# Patient Record
Sex: Female | Born: 1965 | Race: Black or African American | Hispanic: No | Smoking: Never smoker
Health system: Southern US, Community
[De-identification: ages and names within clinical notes are randomized; demographics above are authoritative.]

## PROBLEM LIST (undated history)

## (undated) DIAGNOSIS — I10 Essential (primary) hypertension: Secondary | ICD-10-CM

## (undated) DIAGNOSIS — Z973 Presence of spectacles and contact lenses: Secondary | ICD-10-CM

## (undated) DIAGNOSIS — J302 Other seasonal allergic rhinitis: Secondary | ICD-10-CM

## (undated) DIAGNOSIS — J189 Pneumonia, unspecified organism: Secondary | ICD-10-CM

## (undated) DIAGNOSIS — J40 Bronchitis, not specified as acute or chronic: Secondary | ICD-10-CM

## (undated) DIAGNOSIS — M1990 Unspecified osteoarthritis, unspecified site: Secondary | ICD-10-CM

## (undated) HISTORY — PX: LAPAROSCOPY FOR ECTOPIC PREGNANCY: SUR765

## (undated) HISTORY — PX: DIAGNOSTIC LAPAROSCOPY: SUR761

## (undated) HISTORY — PX: MYELOGRAM: SHX5347

## (undated) HISTORY — PX: TONSILLECTOMY: SUR1361

## (undated) HISTORY — PX: WISDOM TOOTH EXTRACTION: SHX21

---

## 1997-11-07 ENCOUNTER — Emergency Department (HOSPITAL_COMMUNITY): Admission: EM | Admit: 1997-11-07 | Discharge: 1997-11-07 | Payer: Self-pay | Admitting: Emergency Medicine

## 1998-07-19 ENCOUNTER — Emergency Department (HOSPITAL_COMMUNITY): Admission: EM | Admit: 1998-07-19 | Discharge: 1998-07-19 | Payer: Self-pay | Admitting: Emergency Medicine

## 1999-01-04 ENCOUNTER — Encounter: Payer: Self-pay | Admitting: Emergency Medicine

## 1999-01-04 ENCOUNTER — Emergency Department (HOSPITAL_COMMUNITY): Admission: EM | Admit: 1999-01-04 | Discharge: 1999-01-04 | Payer: Self-pay | Admitting: Emergency Medicine

## 1999-01-13 ENCOUNTER — Ambulatory Visit (HOSPITAL_COMMUNITY): Admission: RE | Admit: 1999-01-13 | Discharge: 1999-01-13 | Payer: Self-pay | Admitting: Pulmonary Disease

## 1999-02-10 ENCOUNTER — Encounter (INDEPENDENT_AMBULATORY_CARE_PROVIDER_SITE_OTHER): Payer: Self-pay | Admitting: Specialist

## 1999-02-10 ENCOUNTER — Encounter: Payer: Self-pay | Admitting: Pulmonary Disease

## 1999-02-10 ENCOUNTER — Ambulatory Visit: Admission: RE | Admit: 1999-02-10 | Discharge: 1999-02-10 | Payer: Self-pay | Admitting: Pulmonary Disease

## 1999-07-11 ENCOUNTER — Emergency Department (HOSPITAL_COMMUNITY): Admission: EM | Admit: 1999-07-11 | Discharge: 1999-07-11 | Payer: Self-pay | Admitting: *Deleted

## 2000-08-21 ENCOUNTER — Emergency Department (HOSPITAL_COMMUNITY): Admission: EM | Admit: 2000-08-21 | Discharge: 2000-08-21 | Payer: Self-pay | Admitting: Emergency Medicine

## 2001-06-18 ENCOUNTER — Emergency Department (HOSPITAL_COMMUNITY): Admission: EM | Admit: 2001-06-18 | Discharge: 2001-06-18 | Payer: Self-pay

## 2002-07-10 ENCOUNTER — Ambulatory Visit (HOSPITAL_COMMUNITY): Admission: RE | Admit: 2002-07-10 | Discharge: 2002-07-10 | Payer: Self-pay | Admitting: Obstetrics and Gynecology

## 2002-07-10 ENCOUNTER — Encounter: Payer: Self-pay | Admitting: Obstetrics and Gynecology

## 2003-10-22 ENCOUNTER — Encounter: Admission: RE | Admit: 2003-10-22 | Discharge: 2003-10-22 | Payer: Self-pay | Admitting: Obstetrics and Gynecology

## 2004-01-19 ENCOUNTER — Emergency Department (HOSPITAL_COMMUNITY): Admission: EM | Admit: 2004-01-19 | Discharge: 2004-01-19 | Payer: Self-pay | Admitting: Emergency Medicine

## 2004-10-25 ENCOUNTER — Emergency Department (HOSPITAL_COMMUNITY): Admission: EM | Admit: 2004-10-25 | Discharge: 2004-10-25 | Payer: Self-pay | Admitting: Emergency Medicine

## 2004-10-27 ENCOUNTER — Encounter: Admission: RE | Admit: 2004-10-27 | Discharge: 2004-10-27 | Payer: Self-pay | Admitting: Obstetrics and Gynecology

## 2007-03-10 ENCOUNTER — Emergency Department (HOSPITAL_COMMUNITY): Admission: EM | Admit: 2007-03-10 | Discharge: 2007-03-10 | Payer: Self-pay | Admitting: Emergency Medicine

## 2007-03-14 ENCOUNTER — Emergency Department (HOSPITAL_COMMUNITY): Admission: EM | Admit: 2007-03-14 | Discharge: 2007-03-14 | Payer: Self-pay | Admitting: *Deleted

## 2007-08-21 ENCOUNTER — Emergency Department (HOSPITAL_COMMUNITY): Admission: EM | Admit: 2007-08-21 | Discharge: 2007-08-21 | Payer: Self-pay | Admitting: Emergency Medicine

## 2007-08-27 ENCOUNTER — Emergency Department (HOSPITAL_COMMUNITY): Admission: EM | Admit: 2007-08-27 | Discharge: 2007-08-27 | Payer: Self-pay | Admitting: Emergency Medicine

## 2007-09-25 ENCOUNTER — Ambulatory Visit: Payer: Self-pay | Admitting: Family Medicine

## 2008-03-18 ENCOUNTER — Emergency Department (HOSPITAL_COMMUNITY): Admission: EM | Admit: 2008-03-18 | Discharge: 2008-03-18 | Payer: Self-pay | Admitting: Emergency Medicine

## 2008-04-23 ENCOUNTER — Encounter: Admission: RE | Admit: 2008-04-23 | Discharge: 2008-04-23 | Payer: Self-pay | Admitting: Obstetrics and Gynecology

## 2010-04-16 ENCOUNTER — Other Ambulatory Visit: Payer: Self-pay | Admitting: Obstetrics and Gynecology

## 2010-06-14 LAB — RAPID STREP SCREEN (MED CTR MEBANE ONLY): Streptococcus, Group A Screen (Direct): NEGATIVE

## 2010-09-16 ENCOUNTER — Emergency Department (HOSPITAL_COMMUNITY): Payer: Self-pay

## 2010-09-16 ENCOUNTER — Emergency Department (HOSPITAL_COMMUNITY)
Admission: EM | Admit: 2010-09-16 | Discharge: 2010-09-16 | Disposition: A | Discharge status: Home / Self Care | Payer: Self-pay | Attending: Emergency Medicine | Admitting: Emergency Medicine

## 2010-09-16 DIAGNOSIS — T185XXA Foreign body in anus and rectum, initial encounter: Secondary | ICD-10-CM | POA: Insufficient documentation

## 2010-09-16 DIAGNOSIS — IMO0002 Reserved for concepts with insufficient information to code with codable children: Secondary | ICD-10-CM | POA: Insufficient documentation

## 2010-09-16 DIAGNOSIS — I10 Essential (primary) hypertension: Secondary | ICD-10-CM | POA: Insufficient documentation

## 2010-09-16 LAB — BASIC METABOLIC PANEL
BUN: 13 mg/dL (ref 6–23)
CO2: 27 mEq/L (ref 19–32)
Calcium: 10.1 mg/dL (ref 8.4–10.5)
Glucose, Bld: 111 mg/dL — ABNORMAL HIGH (ref 70–99)
Sodium: 136 mEq/L (ref 135–145)

## 2010-09-16 LAB — DIFFERENTIAL
Basophils Relative: 0 % (ref 0–1)
Eosinophils Relative: 1 % (ref 0–5)
Monocytes Absolute: 0.8 10*3/uL (ref 0.1–1.0)

## 2010-09-16 LAB — CBC
HCT: 39.6 % (ref 36.0–46.0)
Hemoglobin: 14 g/dL (ref 12.0–15.0)
MCH: 28.9 pg (ref 26.0–34.0)
RBC: 4.84 MIL/uL (ref 3.87–5.11)

## 2011-02-24 ENCOUNTER — Encounter: Payer: Self-pay | Admitting: Emergency Medicine

## 2011-02-24 ENCOUNTER — Emergency Department (HOSPITAL_COMMUNITY)
Admission: EM | Admit: 2011-02-24 | Discharge: 2011-02-24 | Disposition: A | Discharge status: Home / Self Care | Payer: Self-pay | Attending: Emergency Medicine | Admitting: Emergency Medicine

## 2011-02-24 DIAGNOSIS — R6889 Other general symptoms and signs: Secondary | ICD-10-CM | POA: Insufficient documentation

## 2011-02-24 DIAGNOSIS — R059 Cough, unspecified: Secondary | ICD-10-CM | POA: Insufficient documentation

## 2011-02-24 DIAGNOSIS — IMO0001 Reserved for inherently not codable concepts without codable children: Secondary | ICD-10-CM | POA: Insufficient documentation

## 2011-02-24 DIAGNOSIS — J45909 Unspecified asthma, uncomplicated: Secondary | ICD-10-CM | POA: Insufficient documentation

## 2011-02-24 DIAGNOSIS — R5381 Other malaise: Secondary | ICD-10-CM | POA: Insufficient documentation

## 2011-02-24 DIAGNOSIS — R51 Headache: Secondary | ICD-10-CM | POA: Insufficient documentation

## 2011-02-24 DIAGNOSIS — R0789 Other chest pain: Secondary | ICD-10-CM | POA: Insufficient documentation

## 2011-02-24 DIAGNOSIS — R05 Cough: Secondary | ICD-10-CM | POA: Insufficient documentation

## 2011-02-24 HISTORY — DX: Bronchitis, not specified as acute or chronic: J40

## 2011-02-24 HISTORY — DX: Essential (primary) hypertension: I10

## 2011-02-24 MED ORDER — ALBUTEROL SULFATE HFA 108 (90 BASE) MCG/ACT IN AERS
1.0000 | INHALATION_SPRAY | Freq: Four times a day (QID) | RESPIRATORY_TRACT | Status: DC | PRN
Start: 2011-02-24 — End: 2013-02-26

## 2011-02-24 MED ORDER — BENZONATATE 100 MG PO CAPS
100.0000 mg | ORAL_CAPSULE | Freq: Once | ORAL | Status: AC
  Administered 2011-02-24: 100 mg via ORAL
  Filled 2011-02-24: qty 1

## 2011-02-24 MED ORDER — ALBUTEROL SULFATE (5 MG/ML) 0.5% IN NEBU
5.0000 mg | INHALATION_SOLUTION | Freq: Once | RESPIRATORY_TRACT | Status: AC
  Administered 2011-02-24: 5 mg via RESPIRATORY_TRACT
  Filled 2011-02-24: qty 1

## 2011-02-24 MED ORDER — IPRATROPIUM BROMIDE 0.02 % IN SOLN
0.5000 mg | Freq: Once | RESPIRATORY_TRACT | Status: AC
  Administered 2011-02-24: 0.5 mg via RESPIRATORY_TRACT
  Filled 2011-02-24: qty 2.5

## 2011-02-24 MED ORDER — HYDROCODONE-HOMATROPINE 5-1.5 MG/5ML PO SYRP: 5.0000 mL | ORAL_SOLUTION | Freq: Four times a day (QID) | ORAL | Status: AC | PRN

## 2011-02-24 NOTE — ED Provider Notes (Signed)
Medical screening examination/treatment/procedure(s) were performed by non-physician practitioner and as supervising physician I was immediately available for consultation/collaboration.   Eleno Weimar, MD 02/24/11 1257 

## 2011-02-24 NOTE — ED Notes (Signed)
Pt c/o asthma "flare-up" x 2 weeks with cough, nasal congestion, body aches; states she was out working in the rain over the past several days and has just gotten worse since that time; using inhalers with no relief; no acute distress noted at triage with O2 sat at 100%

## 2011-02-24 NOTE — ED Provider Notes (Signed)
History     CSN: [id number]  Arrival date & time 02/24/11  9147   First MD Initiated Contact with Patient 02/24/11 1028      Chief Complaint  Patient presents with  . Asthma  . Cough  . Generalized Body Aches  . Headache  . Nasal Congestion    (Consider location/radiation/quality/duration/timing/severity/associated sxs/prior treatment) HPI Comments: Pt presents to the ED with complaints of flu-like symptoms of cough, congestion, sore throat, muscle aches, chills, fevers, ear pain, headaches, abdominal pain, vomiting, diarrhea. The patient states that the symptoms started 7days ago.  Pt has been around other sick contacts and did not get the flu shot this year. The patient denies neck pain, weakness, vision changes, severe abdominal pain, inability to eat or drink, difficulty breathing, SOB, chest pain. The patient has tried cough medicine, NSAIDS, and rest but has only felt mild relief.   Pt has asthma and states that she thinks this sickness is "causing a flare up"    Patient is a 45 y.o. female presenting with asthma, cough, and headaches. The history is provided by the patient.  Asthma This is a chronic problem. Associated symptoms include chills, coughing, fatigue, headaches and myalgias. Pertinent negatives include no abdominal pain, chest pain, congestion, fever, nausea, neck pain, rash, sore throat, vomiting or weakness.  Cough Associated symptoms include chills, headaches and myalgias. Pertinent negatives include no chest pain, no ear pain, no rhinorrhea and no sore throat. Her past medical history is significant for asthma.  Headache  Pertinent negatives include no fever, no palpitations, no nausea and no vomiting.    Past Medical History  Diagnosis Date  . Hypertension   . Asthma   . Bronchitis     Past Surgical History  Procedure Date  . Abdominal hysterectomy     No family history on file.  History  Substance Use Topics  . Smoking status: Never Smoker     . Smokeless tobacco: Not on file  . Alcohol Use: Yes     occasionally    OB History    Grav Para Term Preterm Abortions TAB SAB Ect Mult Living                  Review of Systems  Constitutional: Positive for chills and fatigue. Negative for fever.  HENT: Negative for ear pain, congestion, sore throat, rhinorrhea, sneezing, neck pain, neck stiffness, sinus pressure and tinnitus.   Eyes: Negative for visual disturbance.  Respiratory: Positive for cough and chest tightness.   Cardiovascular: Negative for chest pain and palpitations.  Gastrointestinal: Negative for nausea, vomiting, abdominal pain and diarrhea.  Genitourinary: Negative for dysuria.  Musculoskeletal: Positive for myalgias.  Skin: Negative for color change and rash.  Neurological: Positive for headaches. Negative for dizziness and weakness.  Hematological: Does not bruise/bleed easily.  Psychiatric/Behavioral: Negative for confusion.  All other systems reviewed and are negative.    Allergies  Review of patient's allergies indicates no known allergies.  Home Medications   Current Outpatient Rx  Name Route Sig Dispense Refill  . DEXTROMETHORPHAN POLISTIREX ER 30 MG/5ML PO LQCR Oral Take 60 mg by mouth as needed. COUGH     . HYDROCHLOROTHIAZIDE 25 MG PO TABS Oral Take 25 mg by mouth daily.      Lenn Sink ALLERGY/COUGH PO Oral Take 10 mLs by mouth at bedtime as needed. COUGH     . SPIRONOLACTONE 25 MG PO TABS Oral Take 25 mg by mouth daily.  BP 171/113  Pulse 88  Temp(Src) 98.1 F (36.7 C) (Oral)  Resp 16  SpO2 100%  Physical Exam  Constitutional: She is oriented to person, place, and time. She appears well-developed and well-nourished. No distress.  HENT:  Head: Normocephalic and atraumatic. No trismus in the jaw.  Right Ear: External ear normal. No drainage or tenderness. No mastoid tenderness.  Left Ear: External ear normal. No drainage or tenderness. No mastoid tenderness.  Nose: Nose  normal. No rhinorrhea or sinus tenderness.  Mouth/Throat: Uvula is midline, oropharynx is clear and moist and mucous membranes are normal. No uvula swelling. No oropharyngeal exudate.  Eyes: Conjunctivae and EOM are normal. Right eye exhibits no discharge. Left eye exhibits no discharge. No scleral icterus.  Neck: Normal range of motion. Neck supple.  Cardiovascular: Normal rate, regular rhythm and normal heart sounds.   Pulmonary/Chest: Effort normal and breath sounds normal. No stridor. No respiratory distress. She has no wheezes. She exhibits tenderness.  Abdominal: Soft. There is no tenderness.  Musculoskeletal: Normal range of motion.  Neurological: She is alert and oriented to person, place, and time.  Skin: Skin is warm and dry. No rash noted. She is not diaphoretic.  Psychiatric: She has a normal mood and affect. Her behavior is normal.    ED Course  Procedures (including critical care time)  Labs Reviewed - No data to display No results found.   No diagnosis found.    MDM  Flu like symptoms, asthma  Patient presents with flulike symptoms.  Due to patient's presentation and physical exam a chest x-ray was not ordered bc likely diagnosis of flu.  Discussed the cost versus benefit of Tamiflu treatment with the patient.  The patient understands that symptoms are greater than the recommended 24-48 hour window of treatment.  Patient will be discharged with instructions to orally hydrate, rest, and use over-the-counter medications such as anti-inflammatories ibuprofen and Aleve for muscle aches and Tylenol for fever.  Patient will also be given a cough suppressant. Pt also given a neb tx and an albuterol inhaler.          [place], [place] 02/24/11 1047  [place], [place] 02/24/11 1051

## 2011-06-17 ENCOUNTER — Other Ambulatory Visit (HOSPITAL_COMMUNITY): Payer: Self-pay | Admitting: Obstetrics and Gynecology

## 2011-06-17 DIAGNOSIS — Z1231 Encounter for screening mammogram for malignant neoplasm of breast: Secondary | ICD-10-CM

## 2011-07-13 ENCOUNTER — Ambulatory Visit (HOSPITAL_COMMUNITY)
Admission: RE | Admit: 2011-07-13 | Discharge: 2011-07-13 | Disposition: A | Discharge status: Home / Self Care | Payer: Self-pay | Source: Ambulatory Visit | Attending: Obstetrics and Gynecology | Admitting: Obstetrics and Gynecology

## 2011-07-13 DIAGNOSIS — Z1231 Encounter for screening mammogram for malignant neoplasm of breast: Secondary | ICD-10-CM

## 2012-02-10 ENCOUNTER — Emergency Department (HOSPITAL_BASED_OUTPATIENT_CLINIC_OR_DEPARTMENT_OTHER)
Admission: EM | Admit: 2012-02-10 | Discharge: 2012-02-10 | Disposition: A | Discharge status: Home / Self Care | Payer: Self-pay | Attending: Emergency Medicine | Admitting: Emergency Medicine

## 2012-02-10 ENCOUNTER — Emergency Department (HOSPITAL_BASED_OUTPATIENT_CLINIC_OR_DEPARTMENT_OTHER): Payer: Self-pay

## 2012-02-10 ENCOUNTER — Encounter (HOSPITAL_BASED_OUTPATIENT_CLINIC_OR_DEPARTMENT_OTHER): Payer: Self-pay | Admitting: Family Medicine

## 2012-02-10 DIAGNOSIS — Z79899 Other long term (current) drug therapy: Secondary | ICD-10-CM | POA: Insufficient documentation

## 2012-02-10 DIAGNOSIS — Z8709 Personal history of other diseases of the respiratory system: Secondary | ICD-10-CM | POA: Insufficient documentation

## 2012-02-10 DIAGNOSIS — L989 Disorder of the skin and subcutaneous tissue, unspecified: Secondary | ICD-10-CM | POA: Insufficient documentation

## 2012-02-10 DIAGNOSIS — I10 Essential (primary) hypertension: Secondary | ICD-10-CM | POA: Insufficient documentation

## 2012-02-10 DIAGNOSIS — J45909 Unspecified asthma, uncomplicated: Secondary | ICD-10-CM | POA: Insufficient documentation

## 2012-02-10 DIAGNOSIS — M7989 Other specified soft tissue disorders: Secondary | ICD-10-CM | POA: Insufficient documentation

## 2012-02-10 MED ORDER — SULFAMETHOXAZOLE-TRIMETHOPRIM 800-160 MG PO TABS: 1.0000 | ORAL_TABLET | Freq: Two times a day (BID) | ORAL | Status: DC

## 2012-02-10 NOTE — ED Notes (Signed)
Pt c/o pain and swelling to pinky finger on right hand x 3 wks. Pt sts she thought it was a wart initially.

## 2012-02-10 NOTE — ED Provider Notes (Signed)
Medical screening examination/treatment/procedure(s) were performed by non-physician practitioner and as supervising physician I was immediately available for consultation/collaboration.   Gwyneth Sprout, MD 02/10/12 1539

## 2012-02-10 NOTE — ED Provider Notes (Signed)
History     CSN: [id number]  Arrival date & time 02/10/12  1300   First MD Initiated Contact with Patient 02/10/12 1338      Chief Complaint  Patient presents with  . Hand Pain    (Consider location/radiation/quality/duration/timing/severity/associated sxs/prior treatment) Patient is a 46 y.o. female presenting with hand pain. The history is provided by the patient. No language interpreter was used.  Hand Pain This is a new problem. Episode onset: 3 weeks. The problem occurs constantly. The problem has been gradually worsening. Associated symptoms include joint swelling. The symptoms are aggravated by bending. She has tried nothing for the symptoms. The treatment provided moderate relief.  Pt complains of a swollen area on left finger for 3 weeks.  Pt reports area is starting to look red.  Pt used wart remover with no relief  Past Medical History  Diagnosis Date  . Hypertension   . Asthma   . Bronchitis     Past Surgical History  Procedure Date  . Abdominal hysterectomy     No family history on file.  History  Substance Use Topics  . Smoking status: Never Smoker   . Smokeless tobacco: Not on file  . Alcohol Use: Yes     Comment: occasionally    OB History    Grav Para Term Preterm Abortions TAB SAB Ect Mult Living                  Review of Systems  Musculoskeletal: Positive for joint swelling.  All other systems reviewed and are negative.    Allergies  Review of patient's allergies indicates no known allergies.  Home Medications   Current Outpatient Rx  Name  Route  Sig  Dispense  Refill  . ALBUTEROL SULFATE HFA 108 (90 BASE) MCG/ACT IN AERS   Inhalation   Inhale 1-2 puffs into the lungs every 6 (six) hours as needed for wheezing.   1 Inhaler   0   . DEXTROMETHORPHAN POLISTIREX ER 30 MG/5ML PO LQCR   Oral   Take 60 mg by mouth as needed. COUGH          . HYDROCHLOROTHIAZIDE 25 MG PO TABS   Oral   Take 25 mg by mouth daily.           Lenn Sink ALLERGY/COUGH PO   Oral   Take 10 mLs by mouth at bedtime as needed. COUGH          . SPIRONOLACTONE 25 MG PO TABS   Oral   Take 25 mg by mouth daily.             BP 195/120  Pulse 76  Temp 98.2 F (36.8 C) (Oral)  Resp 16  Ht 5\' 5"  (1.651 m)  Wt 200 lb (90.719 kg)  BMI 33.28 kg/m2  SpO2 100%  LMP 01/13/2012  Physical Exam  Nursing note and vitals reviewed. Constitutional: She appears well-developed and well-nourished.  HENT:  Head: Normocephalic.  Musculoskeletal: Normal range of motion. She exhibits tenderness.       Left 5th finger palmar aspect at dip,   Warty looking growth,  Erythema around area  Neurological: She is alert.  Skin: Skin is warm.  Psychiatric: She has a normal mood and affect.    ED Course  Procedures (including critical care time)  Labs Reviewed - No data to display No results found.   No diagnosis found.    MDM  Xray no abnormality,   I advised pt  to schedule to see Dr. Mina Marble.   Pt given rx for bactrim ds  [address]       Lonia Skinner [place], [place] 02/10/12 1523

## 2012-02-14 ENCOUNTER — Encounter (HOSPITAL_BASED_OUTPATIENT_CLINIC_OR_DEPARTMENT_OTHER): Payer: Self-pay | Admitting: *Deleted

## 2012-02-14 ENCOUNTER — Other Ambulatory Visit: Payer: Self-pay | Admitting: Orthopedic Surgery

## 2012-02-14 NOTE — Progress Notes (Signed)
Pt has no pcp-goes to ER Will need istat and ekg

## 2012-02-15 ENCOUNTER — Encounter (HOSPITAL_BASED_OUTPATIENT_CLINIC_OR_DEPARTMENT_OTHER)
Admission: RE | Disposition: A | Discharge status: Home / Self Care | Payer: Self-pay | Source: Ambulatory Visit | Attending: Orthopedic Surgery

## 2012-02-15 ENCOUNTER — Encounter (HOSPITAL_BASED_OUTPATIENT_CLINIC_OR_DEPARTMENT_OTHER): Payer: Self-pay

## 2012-02-15 ENCOUNTER — Ambulatory Visit (HOSPITAL_BASED_OUTPATIENT_CLINIC_OR_DEPARTMENT_OTHER): Payer: Self-pay | Admitting: Anesthesiology

## 2012-02-15 ENCOUNTER — Ambulatory Visit (HOSPITAL_BASED_OUTPATIENT_CLINIC_OR_DEPARTMENT_OTHER)
Admission: RE | Admit: 2012-02-15 | Discharge: 2012-02-15 | Disposition: A | Discharge status: Home / Self Care | Payer: Self-pay | Source: Ambulatory Visit | Attending: Orthopedic Surgery | Admitting: Orthopedic Surgery

## 2012-02-15 ENCOUNTER — Encounter (HOSPITAL_BASED_OUTPATIENT_CLINIC_OR_DEPARTMENT_OTHER): Payer: Self-pay | Admitting: Anesthesiology

## 2012-02-15 DIAGNOSIS — I10 Essential (primary) hypertension: Secondary | ICD-10-CM | POA: Insufficient documentation

## 2012-02-15 DIAGNOSIS — D1809 Hemangioma of other sites: Secondary | ICD-10-CM | POA: Insufficient documentation

## 2012-02-15 DIAGNOSIS — J45909 Unspecified asthma, uncomplicated: Secondary | ICD-10-CM | POA: Insufficient documentation

## 2012-02-15 DIAGNOSIS — L980 Pyogenic granuloma: Secondary | ICD-10-CM | POA: Diagnosis present

## 2012-02-15 HISTORY — PX: MASS EXCISION: SHX2000

## 2012-02-15 LAB — POCT I-STAT, CHEM 8
BUN: 15 mg/dL (ref 6–23)
Calcium, Ion: 1.26 mmol/L — ABNORMAL HIGH (ref 1.12–1.23)
Chloride: 105 mEq/L (ref 96–112)
Creatinine, Ser: 1.1 mg/dL (ref 0.50–1.10)
Glucose, Bld: 92 mg/dL (ref 70–99)
HCT: 44 % (ref 36.0–46.0)
Hemoglobin: 15 g/dL (ref 12.0–15.0)
Potassium: 3.8 mEq/L (ref 3.5–5.1)
Sodium: 149 mEq/L — ABNORMAL HIGH (ref 135–145)
TCO2: 26 mmol/L (ref 0–100)

## 2012-02-15 SURGERY — EXCISION MASS
Anesthesia: General | Site: Finger | Laterality: Right | Wound class: Clean

## 2012-02-15 MED ORDER — ONDANSETRON HCL 4 MG/2ML IJ SOLN: 4.0000 mg | Freq: Once | INTRAMUSCULAR | Status: DC | PRN

## 2012-02-15 MED ORDER — CEFAZOLIN SODIUM-DEXTROSE 2-3 GM-% IV SOLR
INTRAVENOUS | Status: DC | PRN
  Administered 2012-02-15: 2 g via INTRAVENOUS

## 2012-02-15 MED ORDER — DEXAMETHASONE SODIUM PHOSPHATE 4 MG/ML IJ SOLN
INTRAMUSCULAR | Status: DC | PRN
  Administered 2012-02-15: 10 mg via INTRAVENOUS

## 2012-02-15 MED ORDER — OXYCODONE-ACETAMINOPHEN 5-325 MG PO TABS: 1.0000 | ORAL_TABLET | ORAL | Status: DC | PRN

## 2012-02-15 MED ORDER — LACTATED RINGERS IV SOLN
INTRAVENOUS | Status: DC
Start: 2012-02-15 — End: 2012-02-15
  Administered 2012-02-15 (×2): via INTRAVENOUS

## 2012-02-15 MED ORDER — OXYCODONE HCL 5 MG/5ML PO SOLN: 5.0000 mg | Freq: Once | ORAL | Status: DC | PRN

## 2012-02-15 MED ORDER — FENTANYL CITRATE 0.05 MG/ML IJ SOLN
INTRAMUSCULAR | Status: DC | PRN
  Administered 2012-02-15: 100 ug via INTRAVENOUS

## 2012-02-15 MED ORDER — LIDOCAINE HCL (CARDIAC) 20 MG/ML IV SOLN
INTRAVENOUS | Status: DC | PRN
  Administered 2012-02-15: 100 mg via INTRAVENOUS

## 2012-02-15 MED ORDER — CHLORHEXIDINE GLUCONATE 4 % EX LIQD: 60.0000 mL | Freq: Once | CUTANEOUS | Status: DC

## 2012-02-15 MED ORDER — ONDANSETRON HCL 4 MG/2ML IJ SOLN
INTRAMUSCULAR | Status: DC | PRN
  Administered 2012-02-15: 4 mg via INTRAVENOUS

## 2012-02-15 MED ORDER — BUPIVACAINE HCL (PF) 0.25 % IJ SOLN
INTRAMUSCULAR | Status: DC | PRN
  Administered 2012-02-15: 2 mL

## 2012-02-15 MED ORDER — OXYCODONE HCL 5 MG PO TABS: 5.0000 mg | ORAL_TABLET | Freq: Once | ORAL | Status: DC | PRN

## 2012-02-15 MED ORDER — PROPOFOL 10 MG/ML IV BOLUS
INTRAVENOUS | Status: DC | PRN
  Administered 2012-02-15: 200 mg via INTRAVENOUS

## 2012-02-15 MED ORDER — HYDROMORPHONE HCL PF 1 MG/ML IJ SOLN
0.2500 mg | INTRAMUSCULAR | Status: DC | PRN
  Administered 2012-02-15 (×3): 0.5 mg via INTRAVENOUS

## 2012-02-15 SURGICAL SUPPLY — 49 items
APL SKNCLS STERI-STRIP NONHPOA (GAUZE/BANDAGES/DRESSINGS)
BAG DECANTER FOR FLEXI CONT (MISCELLANEOUS) IMPLANT
BANDAGE ELASTIC 3 VELCRO ST LF (GAUZE/BANDAGES/DRESSINGS) ×2 IMPLANT
BANDAGE ELASTIC 4 VELCRO ST LF (GAUZE/BANDAGES/DRESSINGS) IMPLANT
BANDAGE GAUZE ELAST BULKY 4 IN (GAUZE/BANDAGES/DRESSINGS) ×2 IMPLANT
BENZOIN TINCTURE PRP APPL 2/3 (GAUZE/BANDAGES/DRESSINGS) IMPLANT
BLADE SURG 15 STRL LF DISP TIS (BLADE) ×1 IMPLANT
BLADE SURG 15 STRL SS (BLADE) ×2
BNDG CMPR 9X4 STRL LF SNTH (GAUZE/BANDAGES/DRESSINGS) ×1
BNDG COHESIVE 1X5 TAN STRL LF (GAUZE/BANDAGES/DRESSINGS) ×1 IMPLANT
BNDG ESMARK 4X9 LF (GAUZE/BANDAGES/DRESSINGS) ×1 IMPLANT
CLOTH BEACON ORANGE TIMEOUT ST (SAFETY) ×2 IMPLANT
CORDS BIPOLAR (ELECTRODE) ×2 IMPLANT
COVER TABLE BACK 60X90 (DRAPES) ×2 IMPLANT
CUFF TOURNIQUET SINGLE 18IN (TOURNIQUET CUFF) IMPLANT
CUFF TOURNIQUET SINGLE 24IN (TOURNIQUET CUFF) ×1 IMPLANT
DECANTER SPIKE VIAL GLASS SM (MISCELLANEOUS) IMPLANT
DRAPE EXTREMITY T 121X128X90 (DRAPE) ×2 IMPLANT
DRAPE SURG 17X23 STRL (DRAPES) ×2 IMPLANT
DURAPREP 26ML APPLICATOR (WOUND CARE) ×2 IMPLANT
GAUZE XEROFORM 1X8 LF (GAUZE/BANDAGES/DRESSINGS) ×1 IMPLANT
GLOVE BIO SURGEON STRL SZ 6.5 (GLOVE) ×2 IMPLANT
GLOVE BIO SURGEON STRL SZ8.5 (GLOVE) ×2 IMPLANT
GOWN PREVENTION PLUS XLARGE (GOWN DISPOSABLE) ×2 IMPLANT
GOWN PREVENTION PLUS XXLARGE (GOWN DISPOSABLE) ×2 IMPLANT
NDL HYPO 25X1 1.5 SAFETY (NEEDLE) IMPLANT
NEEDLE HYPO 25X1 1.5 SAFETY (NEEDLE) ×2 IMPLANT
NS IRRIG 1000ML POUR BTL (IV SOLUTION) ×2 IMPLANT
PACK BASIN DAY SURGERY FS (CUSTOM PROCEDURE TRAY) ×2 IMPLANT
PAD CAST 3X4 CTTN HI CHSV (CAST SUPPLIES) ×1 IMPLANT
PADDING CAST COTTON 3X4 STRL (CAST SUPPLIES) ×2
SHEET MEDIUM DRAPE 40X70 STRL (DRAPES) ×2 IMPLANT
SPLINT PLASTER CAST XFAST 4X15 (CAST SUPPLIES) ×5 IMPLANT
SPLINT PLASTER XTRA FAST SET 4 (CAST SUPPLIES) ×5
SPONGE GAUZE 4X4 12PLY (GAUZE/BANDAGES/DRESSINGS) ×2 IMPLANT
STOCKINETTE 4X48 STRL (DRAPES) ×2 IMPLANT
STRIP CLOSURE SKIN 1/2X4 (GAUZE/BANDAGES/DRESSINGS) IMPLANT
SUT ETHILON 4 0 PS 2 18 (SUTURE) ×1 IMPLANT
SUT ETHILON 5 0 PS 2 18 (SUTURE) IMPLANT
SUT PROLENE 3 0 PS 2 (SUTURE) IMPLANT
SUT VIC AB 4-0 P-3 18XBRD (SUTURE) IMPLANT
SUT VIC AB 4-0 P3 18 (SUTURE)
SUT VICRYL RAPIDE 4/0 PS 2 (SUTURE) IMPLANT
SYR BULB 3OZ (MISCELLANEOUS) ×2 IMPLANT
SYR CONTROL 10ML LL (SYRINGE) ×1 IMPLANT
SYRINGE 10CC LL (SYRINGE) IMPLANT
TOWEL OR 17X24 6PK STRL BLUE (TOWEL DISPOSABLE) ×2 IMPLANT
UNDERPAD 30X30 INCONTINENT (UNDERPADS AND DIAPERS) ×2 IMPLANT
WATER STERILE IRR 1000ML POUR (IV SOLUTION) ×1 IMPLANT

## 2012-02-15 NOTE — Anesthesia Postprocedure Evaluation (Signed)
  Anesthesia Post-op Note  Patient: Darlene Crosby  Procedure(s) Performed: Procedure(s) (LRB) with comments: EXCISION MASS (Right) - Excision of Right Small Volar Mass  Patient Location: PACU  Anesthesia Type:General  Level of Consciousness: awake, alert  and oriented  Airway and Oxygen Therapy: Patient Spontanous Breathing and Patient connected to face mask oxygen  Post-op Pain: mild  Post-op Assessment: Post-op Vital signs reviewed, Patient's Cardiovascular Status Stable, Respiratory Function Stable, Patent Airway and No signs of Nausea or vomiting  Post-op Vital Signs: Reviewed and stable  Complications: No apparent anesthesia complications

## 2012-02-15 NOTE — H&P (Signed)
Darlene Crosby is an 46 y.o. female.   Chief Complaint: right small volar mass HPI: as above with 1 m onth h/o enlarging mass on volar aspect of small finger  Past Medical History  Diagnosis Date  . Hypertension   . Asthma   . Bronchitis     Past Surgical History  Procedure Date  . Wisdom tooth extraction   . Diagnostic laparoscopy     tubal preg-took ovary and tube    History reviewed. No pertinent family history. Social History:  reports that she has never smoked. She does not have any smokeless tobacco history on file. She reports that she drinks alcohol. She reports that she does not use illicit drugs.  Allergies: No Known Allergies  Medications Prior to Admission  Medication Sig Dispense Refill  . albuterol (PROVENTIL HFA;VENTOLIN HFA) 108 (90 BASE) MCG/ACT inhaler Inhale 1-2 puffs into the lungs every 6 (six) hours as needed for wheezing.  1 Inhaler  0  . hydrochlorothiazide (HYDRODIURIL) 25 MG tablet Take 25 mg by mouth daily.        Marland Kitchen spironolactone (ALDACTONE) 25 MG tablet Take 25 mg by mouth daily.        Marland Kitchen sulfamethoxazole-trimethoprim (SEPTRA DS) 800-160 MG per tablet Take 1 tablet by mouth every 12 (twelve) hours.  20 tablet  0  . dextromethorphan (DELSYM) 30 MG/5ML liquid Take 60 mg by mouth as needed. COUGH       . Pseudoeph-Bromphen-DM (ROBITUSSIN ALLERGY/COUGH PO) Take 10 mLs by mouth at bedtime as needed. COUGH         Results for orders placed during the hospital encounter of 02/15/12 (from the past 48 hour(s))  POCT I-STAT, CHEM 8     Status: Abnormal   Collection Time   02/15/12  1:07 PM      Component Value Range Comment   Sodium 149 (*) 135 - 145 mEq/L    Potassium 3.8  3.5 - 5.1 mEq/L    Chloride 105  96 - 112 mEq/L    BUN 15  6 - 23 mg/dL    Creatinine, Ser 7.82  0.50 - 1.10 mg/dL    Glucose, Bld 92  70 - 99 mg/dL    Calcium, Ion 9.56 (*) 1.12 - 1.23 mmol/L    TCO2 26  0 - 100 mmol/L    Hemoglobin 15.0  12.0 - 15.0 g/dL    HCT 21.3  08.6 -  57.8 %    No results found.  Review of Systems  All other systems reviewed and are negative.    Blood pressure 136/90, pulse 80, temperature 98 F (36.7 C), temperature source Oral, resp. rate 18, height 5\' 5"  (1.651 m), weight 96.888 kg (213 lb 9.6 oz), last menstrual period 01/23/2012, SpO2 98.00%. Physical Exam  Constitutional: She is oriented to person, place, and time. She appears well-developed and well-nourished.  HENT:  Head: Normocephalic and atraumatic.  Cardiovascular: Normal rate.   Respiratory: Effort normal.  Musculoskeletal:       Right hand: She exhibits deformity and swelling.       Hands: Neurological: She is alert and oriented to person, place, and time.  Skin: Skin is warm.  Psychiatric: She has a normal mood and affect. Her behavior is normal. Judgment and thought content normal.     Assessment/Plan As above  Plan excision with possible FTSG  Puneet Masoner A 02/15/2012, 2:06 PM

## 2012-02-15 NOTE — Brief Op Note (Signed)
02/15/2012  2:42 PM  PATIENT:  Darlene Crosby  46 y.o. female  PRE-OPERATIVE DIAGNOSIS:  Right Small Volar Mass  POST-OPERATIVE DIAGNOSIS:  Right Small Volar Mass  PROCEDURE:  Procedure(s) (LRB) with comments: EXCISION MASS (Right) - Excision of Right Small Volar Mass  SURGEON:  Surgeon(s) and Role:    * Marlowe Shores, MD - Primary  PHYSICIAN ASSISTANT:   ASSISTANTS: none   ANESTHESIA:   general  EBL:     BLOOD ADMINISTERED:none  DRAINS: none   LOCAL MEDICATIONS USED:  MARCAINE   2cc  SPECIMEN:  Biopsy / Limited Resection  DISPOSITION OF SPECIMEN:  PATHOLOGY  COUNTS:  YES  TOURNIQUET:   Total Tourniquet Time Documented: Upper Arm (Right) - 10 minutes  DICTATION: .Other Dictation: Dictation Number [id number]  PLAN OF CARE: Discharge to home after PACU  PATIENT DISPOSITION:  PACU - hemodynamically stable.   Delay start of Pharmacological VTE agent (>24hrs) due to surgical blood loss or risk of bleeding: not applicable

## 2012-02-15 NOTE — Anesthesia Procedure Notes (Addendum)
Procedure Name: LMA Insertion Date/Time: 02/15/2012 2:21 PM Performed by: Gar Gibbon Pre-anesthesia Checklist: Patient identified, Emergency Drugs available, Suction available and Patient being monitored Patient Re-evaluated:Patient Re-evaluated prior to inductionOxygen Delivery Method: Circle system utilized Preoxygenation: Pre-oxygenation with 100% oxygen Intubation Type: IV induction Ventilation: Mask ventilation without difficulty LMA: LMA inserted LMA Size: 4.0 Number of attempts: 1 Tube secured with: Tape Dental Injury: Teeth and Oropharynx as per pre-operative assessment

## 2012-02-15 NOTE — Transfer of Care (Signed)
Immediate Anesthesia Transfer of Care Note  Patient: Darlene Crosby  Procedure(s) Performed: Procedure(s) (LRB) with comments: EXCISION MASS (Right) - Excision of Right Small Volar Mass  Patient Location: PACU  Anesthesia Type:General  Level of Consciousness: awake, sedated and patient cooperative  Airway & Oxygen Therapy: Patient Spontanous Breathing and Patient connected to face mask oxygen  Post-op Assessment: Report given to PACU RN and Post -op Vital signs reviewed and stable  Post vital signs: Reviewed and stable  Complications: No apparent anesthesia complications

## 2012-02-15 NOTE — Op Note (Signed)
See note [id number]

## 2012-02-15 NOTE — Anesthesia Preprocedure Evaluation (Signed)
Anesthesia Evaluation  Patient identified by MRN, date of birth, ID band Patient awake    Reviewed: Allergy & Precautions, H&P , NPO status , Patient's Chart, lab work & pertinent test results  Airway Mallampati: I TM Distance: >3 FB     Dental  (+) Teeth Intact, Missing and Dental Advisory Given   Pulmonary asthma ,  breath sounds clear to auscultation        Cardiovascular hypertension, Pt. on medications Rhythm:Regular Rate:Normal     Neuro/Psych    GI/Hepatic   Endo/Other    Renal/GU      Musculoskeletal   Abdominal   Peds  Hematology   Anesthesia Other Findings   Reproductive/Obstetrics                           Anesthesia Physical Anesthesia Plan  ASA: II  Anesthesia Plan: General   Post-op Pain Management:    Induction: Intravenous  Airway Management Planned: LMA  Additional Equipment:   Intra-op Plan:   Post-operative Plan: Extubation in OR  Informed Consent: I have reviewed the patients History and Physical, chart, labs and discussed the procedure including the risks, benefits and alternatives for the proposed anesthesia with the patient or authorized representative who has indicated his/her understanding and acceptance.   Dental advisory given  Plan Discussed with: CRNA, Anesthesiologist and Surgeon  Anesthesia Plan Comments:         Anesthesia Quick Evaluation

## 2012-02-16 ENCOUNTER — Encounter (HOSPITAL_BASED_OUTPATIENT_CLINIC_OR_DEPARTMENT_OTHER): Payer: Self-pay | Admitting: Orthopedic Surgery

## 2012-02-16 NOTE — Anesthesia Postprocedure Evaluation (Signed)
  Anesthesia Post-op Note  Patient: Darlene Crosby  Procedure(s) Performed: Procedure(s) (LRB) with comments: EXCISION MASS (Right) - Excision of Right Small Volar Mass  Patient Location: PACU  Anesthesia Type:General  Level of Consciousness: awake  Airway and Oxygen Therapy: Patient Spontanous Breathing  Post-op Pain: mild  Post-op Assessment: Post-op Vital signs reviewed  Post-op Vital Signs: Reviewed  Complications: No apparent anesthesia complications

## 2012-02-16 NOTE — Op Note (Signed)
NAMELATIA, MATAYA NO.:  [id number]  MEDICAL RECORD NO.:  [id number]  LOCATION:                                 FACILITY:  PHYSICIAN:  Artist Pais. Madyson Lukach, M.D.DATE OF BIRTH:  [birth year]  DATE OF PROCEDURE:  02/15/2012 DATE OF DISCHARGE:                              OPERATIVE REPORT   PREOPERATIVE DIAGNOSIS:  Right small finger volar mass.  POSTOPERATIVE DIAGNOSIS:  Right small finger volar mass.  PROCEDURE:  Excisional biopsy, deep mass.  SURGEON:  Artist Pais. Mina Marble, MD  ASSISTANT:  None.  ANESTHESIA:  General.  COMPLICATION:  No complication.  DRAINS:  No drains.  SPECIMEN:  One specimen sent.  DESCRIPTION OF PROCEDURE:  The patient was taken to the operating suite. After induction of general anesthesia, right upper extremity was prepped and draped in sterile fashion.  An Esmarch was used to exsanguinate the limb.  Tourniquet was inflated to .  At this point in time, an elliptical incision was made over the middle phalanx where a large mass consistent with probable pyogenic granuloma was carefully excised. Dissection was carried down to the flexor sheath, which is where the mass was originating from, it was carefully excised in its entirety and sent for pathologic confirmation.  Wound was thoroughly irrigated and then loosely closed with 4-0 nylon and with 2 vertical mattress sutures. Xeroform, 4x4s, and a compression Coban wrap was applied.  The patient tolerated procedure well, went to the recovery room in stable fashion.     Artist Pais Mina Marble, M.D.     MAW/MEDQ  D:  02/15/2012  T:  02/16/2012  Job:  [id number]

## 2012-03-26 ENCOUNTER — Emergency Department (HOSPITAL_COMMUNITY): Payer: Self-pay

## 2012-03-26 ENCOUNTER — Emergency Department (HOSPITAL_COMMUNITY)
Admission: EM | Admit: 2012-03-26 | Discharge: 2012-03-26 | Disposition: A | Discharge status: Home / Self Care | Payer: Self-pay | Attending: Emergency Medicine | Admitting: Emergency Medicine

## 2012-03-26 ENCOUNTER — Encounter (HOSPITAL_COMMUNITY): Payer: Self-pay

## 2012-03-26 DIAGNOSIS — I10 Essential (primary) hypertension: Secondary | ICD-10-CM | POA: Insufficient documentation

## 2012-03-26 DIAGNOSIS — B349 Viral infection, unspecified: Secondary | ICD-10-CM

## 2012-03-26 DIAGNOSIS — R059 Cough, unspecified: Secondary | ICD-10-CM | POA: Insufficient documentation

## 2012-03-26 DIAGNOSIS — Z8709 Personal history of other diseases of the respiratory system: Secondary | ICD-10-CM | POA: Insufficient documentation

## 2012-03-26 DIAGNOSIS — R05 Cough: Secondary | ICD-10-CM | POA: Insufficient documentation

## 2012-03-26 DIAGNOSIS — J45909 Unspecified asthma, uncomplicated: Secondary | ICD-10-CM | POA: Insufficient documentation

## 2012-03-26 DIAGNOSIS — Z79899 Other long term (current) drug therapy: Secondary | ICD-10-CM | POA: Insufficient documentation

## 2012-03-26 DIAGNOSIS — B9789 Other viral agents as the cause of diseases classified elsewhere: Secondary | ICD-10-CM | POA: Insufficient documentation

## 2012-03-26 MED ORDER — ALBUTEROL SULFATE HFA 108 (90 BASE) MCG/ACT IN AERS: 2.0000 | INHALATION_SPRAY | RESPIRATORY_TRACT | Status: DC | PRN

## 2012-03-26 MED ORDER — GUAIFENESIN-CODEINE 100-10 MG/5ML PO SYRP: 5.0000 mL | ORAL_SOLUTION | Freq: Three times a day (TID) | ORAL | Status: DC | PRN

## 2012-03-26 NOTE — ED Notes (Signed)
Patient reports a productive cough with green sputum and body aches. Patient has been taking Thera-flu, Robitussin, and Alka Seltzer Plus with no relief. Patient states she has a history of asthma and bronchitis.

## 2012-03-26 NOTE — ED Provider Notes (Signed)
History     CSN: [id number]  Arrival date & time 03/26/12  0715   First MD Initiated Contact with Patient 03/26/12 [phone]      Chief Complaint  Patient presents with  . Cough    (Consider location/radiation/quality/duration/timing/severity/associated sxs/prior treatment) Patient is a 47 y.o. female presenting with cough. The history is provided by the patient. No language interpreter was used.  Cough This is a new problem. The current episode started more than 2 days ago. The problem occurs hourly. The problem has been gradually worsening. The cough is productive of sputum. There has been no fever. Associated symptoms include rhinorrhea, sore throat and wheezing. Pertinent negatives include no chest pain, no chills, no ear pain and no shortness of breath. She has tried decongestants for the symptoms. She is not a smoker. Her past medical history is significant for bronchitis and asthma.  47yo female with productive cough x 2 days and upper respiratory symptoms x 4.  No fever, nausea or vomiting. Out of albuterol inhaler. Does not smoke. Taking over the counter meds with some relief.   pmh asthma, bronchitis and hypertension  Past Medical History  Diagnosis Date  . Hypertension   . Asthma   . Bronchitis     Past Surgical History  Procedure Date  . Wisdom tooth extraction   . Diagnostic laparoscopy     tubal preg-took ovary and tube  . Mass excision 02/15/2012    Procedure: EXCISION MASS;  Surgeon: Marlowe Shores, MD;  Location: [facility];  Service: Orthopedics;  Laterality: Right;  Excision of Right Small Volar Mass    No family history on file.  History  Substance Use Topics  . Smoking status: Never Smoker   . Smokeless tobacco: Never Used  . Alcohol Use: Yes     Comment: occasionally    OB History    Grav Para Term Preterm Abortions TAB SAB Ect Mult Living                  Review of Systems  Constitutional: Negative.  Negative for chills.    HENT: Positive for sore throat, rhinorrhea, voice change and postnasal drip. Negative for ear pain, trouble swallowing and neck pain.   Eyes: Negative.   Respiratory: Positive for cough and wheezing. Negative for shortness of breath.   Cardiovascular: Negative.  Negative for chest pain.  Gastrointestinal: Negative.   Neurological: Negative.   Psychiatric/Behavioral: Negative.   All other systems reviewed and are negative.    Allergies  Strawberry  Home Medications   Current Outpatient Rx  Name  Route  Sig  Dispense  Refill  . DEXTROMETHORPHAN POLISTIREX ER 30 MG/5ML PO LQCR   Oral   Take 60 mg by mouth as needed. COUGH          . LISINOPRIL-HYDROCHLOROTHIAZIDE 20-12.5 MG PO TABS   Oral   Take 1 tablet by mouth daily.         Lenn Sink ALLERGY/COUGH PO   Oral   Take 10 mLs by mouth at bedtime as needed. COUGH          . ALBUTEROL SULFATE HFA 108 (90 BASE) MCG/ACT IN AERS   Inhalation   Inhale 1-2 puffs into the lungs every 6 (six) hours as needed for wheezing.   1 Inhaler   0   . ALBUTEROL SULFATE HFA 108 (90 BASE) MCG/ACT IN AERS   Inhalation   Inhale 2 puffs into the lungs every 4 (four) hours as  needed for wheezing.   1 Inhaler   0   . GUAIFENESIN-CODEINE 100-10 MG/5ML PO SYRP   Oral   Take 5 mLs by mouth 3 (three) times daily as needed for cough.   120 mL   0     BP 121/72  Pulse 82  Temp 98.1 F (36.7 C) (Oral)  Resp 16  SpO2 98%  LMP 01/25/2012  Physical Exam  Nursing note and vitals reviewed. Constitutional: She is oriented to person, place, and time. She appears well-developed and well-nourished.  HENT:  Head: Normocephalic and atraumatic.  Eyes: Conjunctivae normal and EOM are normal. Pupils are equal, round, and reactive to light.  Neck: Normal range of motion. Neck supple.  Cardiovascular: Normal rate.   Pulmonary/Chest: Effort normal and breath sounds normal. No respiratory distress. She has no wheezes.  Abdominal: Soft.   Musculoskeletal: Normal range of motion. She exhibits no edema and no tenderness.  Neurological: She is alert and oriented to person, place, and time. She has normal reflexes.  Skin: Skin is warm and dry.  Psychiatric: She has a normal mood and affect.    ED Course  Procedures (including critical care time)  Labs Reviewed - No data to display Dg Chest 2 View  03/26/2012  *RADIOLOGY REPORT*  Clinical Data: Cough.  Wheezing.  CHEST - 2 VIEW  Comparison: 03/18/2008  Findings: There is slight peribronchial thickening consistent with bronchitis.  Lungs are otherwise clear.  Heart size and vascularity are normal.  No acute osseous abnormality.  IMPRESSION: Mild bronchitic changes.   Original Report Authenticated By: Francene Boyers, M.D.      No diagnosis found.    MDM  URI and cough with normal chest x-ray reviewed by myself. No fever. Out of inhaler.  rx for albuterol inhaler and robitussin ac.  Follow up with pcp of choice from list this week.         Remi Haggard, NP 03/27/12 [phone]

## 2012-03-26 NOTE — ED Notes (Signed)
NP at bedside.

## 2012-03-27 NOTE — ED Provider Notes (Signed)
Medical screening examination/treatment/procedure(s) were performed by non-physician practitioner and as supervising physician I was immediately available for consultation/collaboration.  Shalev Helminiak, MD 03/27/12 1806 

## 2012-06-07 ENCOUNTER — Ambulatory Visit (INDEPENDENT_AMBULATORY_CARE_PROVIDER_SITE_OTHER): Payer: BC Managed Care – PPO | Admitting: Family Medicine

## 2012-06-07 VITALS — BP 154/98 | HR 103 | Temp 98.2°F | Resp 20 | Ht 65.5 in | Wt 221.0 lb

## 2012-06-07 DIAGNOSIS — I10 Essential (primary) hypertension: Secondary | ICD-10-CM | POA: Insufficient documentation

## 2012-06-07 DIAGNOSIS — R509 Fever, unspecified: Secondary | ICD-10-CM

## 2012-06-07 DIAGNOSIS — N912 Amenorrhea, unspecified: Secondary | ICD-10-CM

## 2012-06-07 DIAGNOSIS — R05 Cough: Secondary | ICD-10-CM

## 2012-06-07 LAB — POCT INFLUENZA A/B
Influenza A, POC: NEGATIVE
Influenza B, POC: NEGATIVE

## 2012-06-07 LAB — POCT URINE PREGNANCY: Preg Test, Ur: NEGATIVE

## 2012-06-07 MED ORDER — DOXYCYCLINE HYCLATE 100 MG PO TABS: 100.0000 mg | ORAL_TABLET | Freq: Two times a day (BID) | ORAL | Status: DC

## 2012-06-07 NOTE — Patient Instructions (Addendum)
Please let me know if you are not feeling better in the next couple of days- Sooner if worse.

## 2012-06-07 NOTE — Progress Notes (Addendum)
  Subjective:    Patient ID: Christiane Ha, female    DOB: Jul 06, 1965, 47 y.o.   MRN: [id number]  HPI 47 yo female with fever, body aches, and headache. Started yesterday and got worse throughout the day.Fever of 102 at 5am today. Also reports sore throat and congestion. Headache is the worst issue right now. Diffuse pain all over head. No known sick contacts. Did get her flu shot this year. Had tooth pulled last week, finished amox 1 week ago. Body aches.    Also reports that her blood pressure is up. She has not taken her BP meds yet today and is under stress with funerals this upcoming weekend.  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   She is peri- menopausal, LMP a few months ago.  Not SA since last fall  She works at the [employer]- "I'm outside all the time, I work outside."  Review of Systems  Constitutional: Positive for fever, chills and fatigue. Negative for activity change.  HENT: Positive for congestion, rhinorrhea and postnasal drip. Negative for neck pain, neck stiffness and tinnitus.   Respiratory: Positive for cough. Negative for chest tightness, shortness of breath, wheezing and stridor.   Cardiovascular: Negative.   Gastrointestinal: Negative for nausea, vomiting, diarrhea and constipation.  Musculoskeletal: Positive for myalgias.  Neurological: Positive for headaches.       Objective:   Physical Exam  Constitutional: She appears well-developed and well-nourished.  HENT:  Head: Normocephalic and atraumatic.  Right Ear: External ear normal.   TM wnl Left Ear: External ear normal. TM wnl Mouth/Throat: No oropharyngeal exudate.  Neck: Normal range of motion. Neck supple.  Cardiovascular: Normal rate and normal heart sounds.   Tachycardic - minimally Pulmonary/Chest: Effort normal and breath sounds normal. She has no wheezes. She has no rales. She exhibits no tenderness.  Abdominal: Soft. Bowel sounds are normal.  Lymphadenopathy:    She has no  cervical adenopathy.  Skin: Skin is warm and dry.  No rash- checked palms and soles   Results for orders placed in visit on 06/07/12  POCT INFLUENZA A/B      Result Value Range   Influenza A, POC Negative     Influenza B, POC Negative    POCT URINE PREGNANCY      Result Value Range   Preg Test, Ur Negative         Assessment & Plan:  47 yo female complaining of 24 hours of fever, myalgias, and headache. 1) Fever: likely due to viral illness. Will preventatively begin doxycycline in case of rocky mountain spotted fever as she is often outdoors and has flu- like symtoms. Urine HCG tested prior to beginning doxy. Increase fluids and take tylenol for fever. Follow up as needed. Pt has not taken her BP medication yet today- instructed to do this.

## 2012-06-08 ENCOUNTER — Telehealth: Payer: Self-pay

## 2012-06-08 NOTE — Telephone Encounter (Signed)
PT WAS SEEN IN OUR OFFICE YESTERDAY AND IS CALLING IN REGARDS TO CONTINUING SYMPTOMS. STILL COMPLAINS OF CHILLS,BODY ACHES,RUNNY NOSE, COUGHING, AND SNEEZING. PT STATES THAT THAT THE DOXYCYCLINE IS NOT WORKING FOR HER AT THIS TIME. PHARMACY: [facility]# [phone]

## 2012-06-09 ENCOUNTER — Emergency Department (HOSPITAL_COMMUNITY): Payer: BC Managed Care – PPO

## 2012-06-09 ENCOUNTER — Emergency Department (HOSPITAL_COMMUNITY)
Admission: EM | Admit: 2012-06-09 | Discharge: 2012-06-09 | Disposition: A | Discharge status: Home / Self Care | Payer: BC Managed Care – PPO | Attending: Emergency Medicine | Admitting: Emergency Medicine

## 2012-06-09 ENCOUNTER — Encounter (HOSPITAL_COMMUNITY): Payer: Self-pay | Admitting: Emergency Medicine

## 2012-06-09 DIAGNOSIS — J029 Acute pharyngitis, unspecified: Secondary | ICD-10-CM | POA: Insufficient documentation

## 2012-06-09 DIAGNOSIS — R059 Cough, unspecified: Secondary | ICD-10-CM | POA: Insufficient documentation

## 2012-06-09 DIAGNOSIS — R05 Cough: Secondary | ICD-10-CM | POA: Insufficient documentation

## 2012-06-09 DIAGNOSIS — J40 Bronchitis, not specified as acute or chronic: Secondary | ICD-10-CM

## 2012-06-09 DIAGNOSIS — Z3202 Encounter for pregnancy test, result negative: Secondary | ICD-10-CM | POA: Insufficient documentation

## 2012-06-09 DIAGNOSIS — I10 Essential (primary) hypertension: Secondary | ICD-10-CM | POA: Insufficient documentation

## 2012-06-09 DIAGNOSIS — J3489 Other specified disorders of nose and nasal sinuses: Secondary | ICD-10-CM | POA: Insufficient documentation

## 2012-06-09 DIAGNOSIS — IMO0001 Reserved for inherently not codable concepts without codable children: Secondary | ICD-10-CM | POA: Insufficient documentation

## 2012-06-09 DIAGNOSIS — R51 Headache: Secondary | ICD-10-CM | POA: Insufficient documentation

## 2012-06-09 DIAGNOSIS — J45909 Unspecified asthma, uncomplicated: Secondary | ICD-10-CM | POA: Insufficient documentation

## 2012-06-09 DIAGNOSIS — Z79899 Other long term (current) drug therapy: Secondary | ICD-10-CM | POA: Insufficient documentation

## 2012-06-09 MED ORDER — ALBUTEROL SULFATE (5 MG/ML) 0.5% IN NEBU
5.0000 mg | INHALATION_SOLUTION | Freq: Once | RESPIRATORY_TRACT | Status: AC
  Administered 2012-06-09: 5 mg via RESPIRATORY_TRACT
  Filled 2012-06-09: qty 1

## 2012-06-09 MED ORDER — PREDNISONE 10 MG PO TABS: ORAL_TABLET | ORAL | Status: DC

## 2012-06-09 MED ORDER — HYDROCODONE-HOMATROPINE 5-1.5 MG/5ML PO SYRP: 2.5000 mL | ORAL_SOLUTION | Freq: Four times a day (QID) | ORAL | Status: DC | PRN

## 2012-06-09 NOTE — ED Provider Notes (Signed)
History    This chart was scribed for [name] PA-C a non-physician practitioner working with [name], MD by [name], ED Scribe. This patient was seen in room WTR5/WTR5 and the patient's care was started at 1744.     CSN: [id number]  Arrival date & time 06/09/12  1702   First MD Initiated Contact with Patient 06/09/12 1706      Chief Complaint  Patient presents with  . URI    (Consider location/radiation/quality/duration/timing/severity/associated sxs/prior treatment) The history is provided by the patient.   Darlene Crosby is a 47 y.o. female who presents to the Emergency Department complaining of generalized myalgias onset 3 days. Pt reports constant, worsening fever, chills, myalgias, non-productive cough, sore throat, watery eyes, headaches, and rhinorrhea. Pt reports going to the urgent care 3 days ago and reports a negative flu test. Pt denies dysuria. Pt denies any aggravating factors. Pt reports taking 800 mg of ibuprofen, using albuterol inhaler every 4 hours, claritin, and prescribed doxycycline with no relief of symptoms. Pt denies smoking. Pt reports hx of asthma and bronchitis.    Past Medical History  Diagnosis Date  . Hypertension   . Asthma   . Bronchitis     Past Surgical History  Procedure Laterality Date  . Wisdom tooth extraction    . Diagnostic laparoscopy      tubal preg-took ovary and tube  . Mass excision  02/15/2012    Procedure: EXCISION MASS;  Surgeon: Marlowe Shores, MD;  Location: [facility];  Service: Orthopedics;  Laterality: Right;  Excision of Right Small Volar Mass    Family History  Problem Relation Age of Onset  . Hypertension Mother   . Heart disease Sister   . Hypertension Sister     History  Substance Use Topics  . Smoking status: Never Smoker   . Smokeless tobacco: Never Used  . Alcohol Use: Yes     Comment: occasionally    OB History   Grav Para Term Preterm Abortions TAB SAB  Ect Mult Living                  Review of Systems  Constitutional: Positive for fever.  HENT: Positive for congestion, sore throat and rhinorrhea.   Respiratory: Positive for cough.   Cardiovascular: Negative.   Gastrointestinal: Negative.   Genitourinary: Negative for dysuria.  Musculoskeletal: Positive for myalgias.  Skin: Negative.   Neurological: Positive for headaches.  Psychiatric/Behavioral: Negative.   All other systems reviewed and are negative.   A complete 10 system review of systems was obtained and all systems are negative except as noted in the HPI and PMH.    Allergies  Strawberry  Home Medications   Current Outpatient Rx  Name  Route  Sig  Dispense  Refill  . EXPIRED: albuterol (PROVENTIL HFA;VENTOLIN HFA) 108 (90 BASE) MCG/ACT inhaler   Inhalation   Inhale 1-2 puffs into the lungs every 6 (six) hours as needed for wheezing.   1 Inhaler   0   . albuterol (PROVENTIL HFA;VENTOLIN HFA) 108 (90 BASE) MCG/ACT inhaler   Inhalation   Inhale 2 puffs into the lungs every 4 (four) hours as needed for wheezing.   1 Inhaler   0   . doxycycline (VIBRA-TABS) 100 MG tablet   Oral   Take 1 tablet (100 mg total) by mouth 2 (two) times daily.   20 tablet   0   . lisinopril-hydrochlorothiazide (PRINZIDE,ZESTORETIC) 20-12.5 MG per tablet  Oral   Take 1 tablet by mouth daily.         . Multiple Vitamin (MULTIVITAMIN) tablet   Oral   Take 1 tablet by mouth daily.         Marland Kitchen spironolactone (ALDACTONE) 25 MG tablet   Oral   Take 25 mg by mouth daily.           There were no vitals taken for this visit.  Physical Exam  Nursing note and vitals reviewed. Constitutional: She is oriented to person, place, and time. She appears well-developed and well-nourished. No distress.  HENT:  Head: Normocephalic and atraumatic.  Right Ear: Tympanic membrane normal.  Left Ear: Tympanic membrane normal.  Nose: Right sinus exhibits no maxillary sinus tenderness and no  frontal sinus tenderness. Left sinus exhibits no maxillary sinus tenderness and no frontal sinus tenderness.  Mouth/Throat: Uvula is midline, oropharynx is clear and moist and mucous membranes are normal. No oropharyngeal exudate, posterior oropharyngeal edema or posterior oropharyngeal erythema.  Eyes: EOM are normal.  Neck: Neck supple. No tracheal deviation present.  Cardiovascular: Normal rate.   Pulmonary/Chest: Effort normal. No respiratory distress.  Diffuse expiratory wheezing   Musculoskeletal: Normal range of motion.  Neurological: She is alert and oriented to person, place, and time.  Skin: Skin is warm and dry.  Psychiatric: She has a normal mood and affect. Her behavior is normal.    ED Course  Procedures (including critical care time) Medications  albuterol (PROVENTIL) (5 MG/ML) 0.5% nebulizer solution 5 mg (not administered)    Results for orders placed in visit on 06/07/12  POCT INFLUENZA A/B      Result Value Range   Influenza A, POC Negative     Influenza B, POC Negative    POCT URINE PREGNANCY      Result Value Range   Preg Test, Ur Negative     Dg Chest 2 View  06/09/2012  *RADIOLOGY REPORT*  Clinical Data: Fever and cough  CHEST - 2 VIEW  Comparison:  03/26/2012  Findings: Pain heart size and vascular pattern are normal.  No infiltrate, consolidation, or effusion.  Bony thorax is intact.  IMPRESSION: No significant abnormalities   Original Report Authenticated By: Esperanza Heir, M.D.       1. Bronchitis       MDM  PT with URI symptoms, cough, fevers at home. Here 99.8. PT has hx of asthma. Seen two days ago at [facility], had negateve influenza A and B titers. She was started on doxycycline at that time. She is taking her albuterol inhaler at home. Here because feeling worse. Pt's VS are normal here. Her exam is non toxic. She is not having any chest pain or SOB. Pt received neb in ED, lungs improved. She will be d/c home with cont. Doxycycline, inhaler, will  give prednisone taper, hycodan. She is to follow up with her PCP.   Filed Vitals:   06/09/12 1720  BP: 132/89  Pulse: 97  Temp: 99.8 F (37.7 C)  Resp: 19      I personally performed the services described in this documentation, which was scribed in my presence. The recorded information has been reviewed and is accurate.   Lottie Mussel, PA-C 06/09/12 1913

## 2012-06-09 NOTE — Telephone Encounter (Signed)
LMOM to CB. 

## 2012-06-09 NOTE — Telephone Encounter (Signed)
Patient was diagnosed with a viral illness and covered with Doxycycline as a preventative for RMSF. It will take longer than 24 hours for her to feel better. Give it time.

## 2012-06-09 NOTE — ED Notes (Signed)
Pt also states that did flu test on her and was negative.

## 2012-06-09 NOTE — ED Notes (Signed)
Pt c/o body aches, runny nose, fever, coughing, and watery eyes. Pt states she was seen at urgent care yesterday and was told if she isnt gettign any better to come back. Pt states she called them today multiple and she never got a call back. Pt states that she started an antibiotic on Thursday but if feeling worse over the past two days instead of better.

## 2012-06-11 NOTE — Telephone Encounter (Signed)
Called again, left message for her to call me back. She has also gone to ER.

## 2012-06-12 NOTE — ED Provider Notes (Signed)
Medical screening examination/treatment/procedure(s) were performed by non-physician practitioner and as supervising physician I was immediately available for consultation/collaboration.   Sherle Mello, MD 06/12/12 1051 

## 2012-06-12 NOTE — Telephone Encounter (Signed)
Patient not responding to calls, hopefully she is better.

## 2012-07-16 ENCOUNTER — Other Ambulatory Visit (HOSPITAL_COMMUNITY): Payer: Self-pay | Admitting: Obstetrics and Gynecology

## 2012-07-16 DIAGNOSIS — Z1231 Encounter for screening mammogram for malignant neoplasm of breast: Secondary | ICD-10-CM

## 2012-07-26 ENCOUNTER — Ambulatory Visit (HOSPITAL_COMMUNITY)
Admission: RE | Admit: 2012-07-26 | Discharge: 2012-07-26 | Disposition: A | Discharge status: Home / Self Care | Payer: BC Managed Care – PPO | Source: Ambulatory Visit | Attending: Obstetrics and Gynecology | Admitting: Obstetrics and Gynecology

## 2012-07-26 DIAGNOSIS — Z1231 Encounter for screening mammogram for malignant neoplasm of breast: Secondary | ICD-10-CM | POA: Insufficient documentation

## 2013-02-26 ENCOUNTER — Encounter: Payer: Self-pay | Admitting: Internal Medicine

## 2013-02-26 ENCOUNTER — Ambulatory Visit (INDEPENDENT_AMBULATORY_CARE_PROVIDER_SITE_OTHER): Payer: BC Managed Care – PPO | Admitting: Internal Medicine

## 2013-02-26 VITALS — BP 120/84 | HR 71 | Temp 97.7°F | Ht 65.0 in | Wt 231.8 lb

## 2013-02-26 DIAGNOSIS — J45909 Unspecified asthma, uncomplicated: Secondary | ICD-10-CM

## 2013-02-26 DIAGNOSIS — I10 Essential (primary) hypertension: Secondary | ICD-10-CM

## 2013-02-26 MED ORDER — OLMESARTAN MEDOXOMIL-HCTZ 20-12.5 MG PO TABS: 1.0000 | ORAL_TABLET | Freq: Every day | ORAL | Status: DC

## 2013-02-26 MED ORDER — METHYLPREDNISOLONE ACETATE 80 MG/ML IJ SUSP
120.0000 mg | Freq: Once | INTRAMUSCULAR | Status: AC
  Administered 2013-02-26: 120 mg via INTRAMUSCULAR

## 2013-02-26 NOTE — Progress Notes (Signed)
   Subjective:    Patient ID: Darlene Crosby, female    DOB: 1965-09-08  MRN: [id number]  HPI  66 yobf never smoker new onset sob/cough onset when husband died from sarcoid 2004/07/15 and dx as asthma and maintained on inhalers including saba ever since self referred 02/26/2013 to pulmonary clinic   02/26/2013 1st [facility] office visit/ Shima Compere on ACEi cc worse dry cough and sob x 4 months.  Working outside  seems to make it worse but at present can only walk 50 ft s stopping due to sob only a little better p saba. Onset was insidious, pattern is progressively worse, assoc with overt HB  No obvious other patterns in day to day or daytime variabilty or assoc  cp or chest tightness, subjective wheeze overt sinus   symptoms. No unusual exp hx or h/o childhood pna/ asthma or knowledge of premature birth.  Sleeping ok without nocturnal  or early am exacerbation  of respiratory  c/o's or need for noct saba. Also denies any obvious fluctuation of symptoms with weather or environmental changes or other aggravating or alleviating factors except as outlined above   Current Medications, Allergies, Complete Past Medical History, Past Surgical History, Family History, and Social History were reviewed in [employer] record.          Review of Systems  Constitutional: Negative for fever, chills and unexpected weight change.  HENT: Positive for congestion, dental problem and sneezing. Negative for ear pain, nosebleeds, postnasal drip, rhinorrhea, sinus pressure, sore throat, trouble swallowing and voice change.   Eyes: Negative for visual disturbance.  Respiratory: Positive for cough and shortness of breath. Negative for choking.   Cardiovascular: Negative for chest pain and leg swelling.  Gastrointestinal: Negative for vomiting, abdominal pain and diarrhea.  Genitourinary: Negative for difficulty urinating.       Acid Heartburn  Musculoskeletal: Negative for arthralgias.  Skin:  Positive for rash.  Neurological: Positive for headaches. Negative for tremors and syncope.  Hematological: Does not bruise/bleed easily.       Objective:   Physical Exam  Wt Readings from Last 3 Encounters:  02/26/13 231 lb 12.8 oz (105.144 kg)  06/07/12 221 lb (100.245 kg)  02/15/12 213 lb 9.6 oz (96.888 kg)     amb hoarse bf with classic pseudowheeze  HEENT: nl dentition, turbinates, and orophanx. Nl external ear canals without cough reflex   NECK :  without JVD/Nodes/TM/ nl carotid upstrokes bilaterally   LUNGS: no acc muscle use, clear to A and P bilaterally without cough on insp or exp maneuvers   CV:  RRR  no s3 or murmur or increase in P2, no edema   ABD:  soft and nontender with nl excursion in the supine position. No bruits or organomegaly, bowel sounds nl  MS:  warm without deformities, calf tenderness, cyanosis or clubbing  SKIN: warm and dry without lesions    NEURO:  alert, approp, no deficits           Assessment & Plan:

## 2013-02-26 NOTE — Patient Instructions (Addendum)
Stop lisinopril - this is the only way to know whether it is causing you to cough and wheeze and takes up to a month to prove one way or the other Start benicar 20/12.5 one daily in place of lisinopril - if too strong ok to break in half and just take a half daily   Try prilosec 20mg   Take 30-60 min before first meal of the day and zantac 150  one bedtime until cough is completely gone for at least a week    Work on inhaler technique:  relax and gently blow all the way out then take a nice smooth deep breath back in, triggering the inhaler at same time you start breathing in.  Hold for up to 5 seconds if you can.  Rinse and gargle with water when done   Short of breath > try proair first then then neb if the proair doesn't work  Please schedule a follow up office visit in 4 weeks, sooner if needed

## 2013-02-28 DIAGNOSIS — J45909 Unspecified asthma, uncomplicated: Secondary | ICD-10-CM | POA: Insufficient documentation

## 2013-02-28 HISTORY — PX: COLONOSCOPY: SHX174

## 2013-02-28 NOTE — Assessment & Plan Note (Addendum)

## 2013-02-28 NOTE — Assessment & Plan Note (Signed)
DDX of  difficult airways managment all start with A and  include Adherence, Ace Inhibitors, Acid Reflux, Active Sinus Disease, Alpha 1 Antitripsin deficiency, Anxiety masquerading as Airways dz,  ABPA,  allergy(esp in young), Aspiration (esp in elderly), Adverse effects of DPI,  Active smokers, plus two Bs  = Bronchiectasis and Beta blocker use..and one C= CHF  Adherence is always the initial "prime suspect" and is a multilayered concern that requires a "trust but verify" approach in every patient - starting with knowing how to use medications, especially inhalers, correctly, keeping up with refills and understanding the fundamental difference between maintenance and prns vs those medications only taken for a very short course and then stopped and not refilled. The proper method of use, as well as anticipated side effects, of a metered-dose inhaler are discussed and demonstrated to the patient. Improved effectiveness after extensive coaching during this visit to a level of approximately  75%   ACEi an obvious "chief suspect" > try off (see hbp a/p)  ? Acid (or non-acid) GERD > always difficult to exclude as up to 75% of pts in some series report no assoc GI/ Heartburn symptoms> rec max (24h)  acid suppression and diet restrictions/ reviewed and instructions given in writing.  ? Anxiety > note onset of symptoms with husband's death from sarcoidosis   See instructions for specific recommendations which were reviewed directly with the patient who was given a copy with highlighter outlining the key components.

## 2013-03-26 ENCOUNTER — Ambulatory Visit (INDEPENDENT_AMBULATORY_CARE_PROVIDER_SITE_OTHER): Payer: BC Managed Care – PPO | Admitting: Internal Medicine

## 2013-03-26 ENCOUNTER — Encounter: Payer: Self-pay | Admitting: Internal Medicine

## 2013-03-26 ENCOUNTER — Ambulatory Visit (INDEPENDENT_AMBULATORY_CARE_PROVIDER_SITE_OTHER)
Admission: RE | Admit: 2013-03-26 | Discharge: 2013-03-26 | Disposition: A | Discharge status: Home / Self Care | Payer: BC Managed Care – PPO | Source: Ambulatory Visit | Attending: Internal Medicine | Admitting: Internal Medicine

## 2013-03-26 VITALS — BP 102/70 | HR 72 | Temp 98.6°F | Ht 65.0 in | Wt 228.0 lb

## 2013-03-26 DIAGNOSIS — R0600 Dyspnea, unspecified: Secondary | ICD-10-CM

## 2013-03-26 DIAGNOSIS — R0989 Other specified symptoms and signs involving the circulatory and respiratory systems: Secondary | ICD-10-CM

## 2013-03-26 DIAGNOSIS — J45909 Unspecified asthma, uncomplicated: Secondary | ICD-10-CM

## 2013-03-26 DIAGNOSIS — R0609 Other forms of dyspnea: Secondary | ICD-10-CM

## 2013-03-26 DIAGNOSIS — I10 Essential (primary) hypertension: Secondary | ICD-10-CM

## 2013-03-26 NOTE — Progress Notes (Signed)
Subjective:    Patient ID: Darlene Crosby, female    DOB: [birth year]  MRN: [id number]   Brief patient profile:  9 yobf never smoker new onset sob/cough onset when husband died from sarcoid Jun 28, 2004 and dx as asthma and maintained on inhalers including saba ever since self referred 02/26/2013 to pulmonary clinic    History of Present Illness  02/26/2013 1st [facility] office visit/ Darlene Crosby on ACEi cc worse dry cough and sob x 4 months.  Working outside  seems to make it worse but at present can only walk 50 ft s stopping due to sob only a little better p saba. Onset was insidious, pattern is progressively worse, assoc with overt HB rec Stop lisinopril - this is the only way to know whether it is causing you to cough and wheeze and takes up to a month to prove one way or the other Start benicar 20/12.5 one daily in place of lisinopril - if too strong ok to break in half and just take a half daily  Try prilosec 20mg   Take 30-60 min before first meal of the day and zantac 150  one bedtime until cough is completely gone for at least a week   Work on inhaler technique:   Short of breath > try proair first then then neb if the proair doesn't work   03/26/2013 f/u ov/Darlene Crosby re: unexplained cough and sob  Chief Complaint  Patient presents with  . Follow-up    Cough has resolved. She states SOB seems slightly better since last visit. No new co's today. She stopped symbicort and is using rescue inhaler approx 4 times per day.    Breathing was fine then sob after the shower while dressing > used proair last one day prior to OV   but on zero. No noct symptoms Doe x across parking lot "maybe 75 feet" but used [facility] parking   No obvious patterns in  day to day or daytime variabilty or assoc chronic cough or cp or chest tightness, subjective wheeze overt sinus or hb symptoms. No unusual exp hx or h/o childhood pna/ asthma or knowledge of premature birth.  Sleeping ok without nocturnal  or early am  exacerbation  of respiratory  c/o's or need for noct saba. Also denies any obvious fluctuation of symptoms with weather or environmental changes or other aggravating or alleviating factors except as outlined above   Current Medications, Allergies, Complete Past Medical History, Past Surgical History, Family History, and Social History were reviewed in [employer] record.  ROS  The following are not active complaints unless bolded sore throat, dysphagia, dental problems, itching, sneezing,  nasal congestion or excess/ purulent secretions, ear ache,   fever, chills, sweats, unintended wt loss, pleuritic or exertional cp, hemoptysis,  orthopnea pnd or leg swelling, presyncope, palpitations, heartburn, abdominal pain, anorexia, nausea, vomiting, diarrhea  or change in bowel or urinary habits, change in stools or urine, dysuria,hematuria,  rash, arthralgias, visual complaints, headache, numbness weakness or ataxia or problems with walking or coordination,  change in mood/affect or memory.                      Objective:   Physical Exam  03/26/2013       228  Wt Readings from Last 3 Encounters:  02/26/13 231 lb 12.8 oz (105.144 kg)  06/07/12 221 lb (100.245 kg)  02/15/12 213 lb 9.6 oz (96.888 kg)     amb bf no longer  hoarse or pseudowheeze  HEENT: nl dentition, turbinates, and orophanx. Nl external ear canals without cough reflex   NECK :  without JVD/Nodes/TM/ nl carotid upstrokes bilaterally   LUNGS: no acc muscle use, clear to A and P bilaterally without cough on insp or exp maneuvers   CV:  RRR  no s3 or murmur or increase in P2, no edema   ABD:  soft and nontender with nl excursion in the supine position. No bruits or organomegaly, bowel sounds nl  MS:  warm without deformities, calf tenderness, cyanosis or clubbing  SKIN: warm and dry without lesions           CXR  03/26/2013 :  Poor inspiration. Minimal increased lung markings in the lung bases   noted. Mild pneumonitis cannot be excluded.      Assessment & Plan:

## 2013-03-26 NOTE — Assessment & Plan Note (Signed)
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

## 2013-03-26 NOTE — Patient Instructions (Addendum)
Please remember to go to the  x-ray department downstairs for your tests - we will call you with the results when they are available.  If not better in a month, call to schedule a cpst - [phone] and ask for [facility]

## 2013-03-27 ENCOUNTER — Other Ambulatory Visit: Payer: Self-pay | Admitting: Internal Medicine

## 2013-03-27 MED ORDER — OLMESARTAN MEDOXOMIL-HCTZ 20-12.5 MG PO TABS: 1.0000 | ORAL_TABLET | Freq: Every day | ORAL | Status: DC

## 2013-03-27 NOTE — Assessment & Plan Note (Addendum)
-   hfa 75% p coaching 02/26/13  - 03/26/13 spirometry nl off all inhalers with doe x 75 ft reported - 03/26/2013  Walked RA x 3 laps @ 185 ft each stopped due to  End of study, no sob or desat  Symptoms are markedly disproportionate to objective findings and not clear this is a lung problem but pt does appear to have difficult airway management issues. DDX of  difficult airways managment all start with A and  include Adherence, Ace Inhibitors, Acid Reflux, Active Sinus Disease, Alpha 1 Antitripsin deficiency, Anxiety masquerading as Airways dz,  ABPA,  allergy(esp in young), Aspiration (esp in elderly), Adverse effects of DPI,  Active smokers, plus two Bs  = Bronchiectasis and Beta blocker use..and one C= CHF  Adherence is always the initial "prime suspect" and is a multilayered concern that requires a "trust but verify" approach in every patient - starting with knowing how to use medications, especially inhalers, correctly, keeping up with refills and understanding the fundamental difference between maintenance and prns vs those medications only taken for a very short course and then stopped and not refilled.  - note proair on zero ? How long  ? Anxiety > usually dx of exclusion but note onset of symptoms with husband's death from sarcoid in 06/04/2004  ? ACEi > better off them and would not rechallenge  ? Allergy/asthma> no better on symbicort, not worse at hs or early am so strongly doubt   Next step in w/u in cpst with before and after FEV1 if want to pursue further as symptoms are just occuring now reproducibly with ex. Has already had "all the usual lab tests" per pt per Shelda Jakes > would be sure tsh and bnp bmet and cbc are all included before cpst considered.  See instructions for specific recommendations which were reviewed directly with the patient who was given a copy with highlighter outlining the key components.

## 2013-03-27 NOTE — Assessment & Plan Note (Signed)
-   03/26/13 spirometry nl off all inhalers with doe x 75 ft reported - 03/26/2013  Walked RA x 3 laps @ 185 ft each stopped due to  End of study, no sob or desat  See asthma vs vcd

## 2013-03-27 NOTE — Progress Notes (Signed)
Quick Note:  Spoke with pt and notified of results per Dr. Wert. Pt verbalized understanding and denied any questions.  ______ 

## 2013-03-28 ENCOUNTER — Telehealth: Payer: Self-pay | Admitting: Internal Medicine

## 2013-03-28 NOTE — Telephone Encounter (Signed)
irbesartan 150 - 12.5 one daily

## 2013-03-28 NOTE — Telephone Encounter (Signed)
Received fax from [facility] for PA for Benicar-HCT 20.12.5mg  Called [facility] to get insurance ID/plan phone etc >> ID [id number]       [place] [phone]  [place] and spoke with representative Michaelene Per Michaelene, the covered alternatives to this medication are: Irbesartan-hct 150-12.5mg  (Avalide) Valsartan-hct 80-12.5mg  (Diovan-HCT) Candesartan-hctz 16.12.5mg  (Atacand)  Telmisartan-hctz 40.12.5mg  (Micardis HCT) Losartan-hctz 50-12.5mg  (Hyzaar) Eprosartan Mesylate 600mg  (Teveten) HCTZ 25mg   Dr Melvyn Novas please advise, thank you.

## 2013-03-29 MED ORDER — IRBESARTAN-HYDROCHLOROTHIAZIDE 150-12.5 MG PO TABS: 1.0000 | ORAL_TABLET | Freq: Every day | ORAL | Status: DC

## 2013-03-29 NOTE — Telephone Encounter (Signed)
Spoke with the pt and notified of recs per MW She verbalized understanding and is okay with med change  Rx was sent to [facility]

## 2013-04-03 ENCOUNTER — Ambulatory Visit (INDEPENDENT_AMBULATORY_CARE_PROVIDER_SITE_OTHER): Payer: BC Managed Care – PPO | Admitting: Family Medicine

## 2013-04-03 VITALS — BP 140/88 | HR 89 | Temp 98.3°F | Resp 18 | Ht 66.0 in | Wt 236.6 lb

## 2013-04-03 DIAGNOSIS — M549 Dorsalgia, unspecified: Secondary | ICD-10-CM

## 2013-04-03 MED ORDER — NAPROXEN 500 MG PO TABS: 500.0000 mg | ORAL_TABLET | Freq: Two times a day (BID) | ORAL | Status: DC

## 2013-04-03 MED ORDER — METHOCARBAMOL 750 MG PO TABS: 750.0000 mg | ORAL_TABLET | Freq: Three times a day (TID) | ORAL | Status: DC | PRN

## 2013-04-03 NOTE — Patient Instructions (Signed)
Thank you for coming in today  I think that your back pain is coming from muscles  - Take naproxen 500 mg 2x per day with food - Use robaxin muscle relaxer 2x per day as needed for spasm - Ice and heat for 15 minute several times per day  Return if you have worsening cough, trouble breathing, or fever > 100.4  Back Pain, Adult Low back pain is very common. About 1 in 5 people have back pain.The cause of low back pain is rarely dangerous. The pain often gets better over time.About half of people with a sudden onset of back pain feel better in just 2 weeks. About 8 in 10 people feel better by 6 weeks.  CAUSES Some common causes of back pain include:  Strain of the muscles or ligaments supporting the spine.  Wear and tear (degeneration) of the spinal discs.  Arthritis.  Direct injury to the back. DIAGNOSIS Most of the time, the direct cause of low back pain is not known.However, back pain can be treated effectively even when the exact cause of the pain is unknown.Answering your caregiver's questions about your overall health and symptoms is one of the most accurate ways to make sure the cause of your pain is not dangerous. If your caregiver needs more information, he or she may order lab work or imaging tests (X-rays or MRIs).However, even if imaging tests show changes in your back, this usually does not require surgery. HOME CARE INSTRUCTIONS For many people, back pain returns.Since low back pain is rarely dangerous, it is often a condition that people can learn to [facility] their own.   Remain active. It is stressful on the back to sit or stand in one place. Do not sit, drive, or stand in one place for more than 30 minutes at a time. Take short walks on level surfaces as soon as pain allows.Try to increase the length of time you walk each day.  Do not stay in bed.Resting more than 1 or 2 days can delay your recovery.  Do not avoid exercise or work.Your body is made to move.It  is not dangerous to be active, even though your back may hurt.Your back will likely heal faster if you return to being active before your pain is gone.  Pay attention to your body when you bend and lift. Many people have less discomfortwhen lifting if they bend their knees, keep the load close to their bodies,and avoid twisting. Often, the most comfortable positions are those that put less stress on your recovering back.  Find a comfortable position to sleep. Use a firm mattress and lie on your side with your knees slightly bent. If you lie on your back, put a pillow under your knees.  Only take over-the-counter or prescription medicines as directed by your caregiver. Over-the-counter medicines to reduce pain and inflammation are often the most helpful.Your caregiver may prescribe muscle relaxant drugs.These medicines help dull your pain so you can more quickly return to your normal activities and healthy exercise.  Put ice on the injured area.  Put ice in a plastic bag.  Place a towel between your skin and the bag.  Leave the ice on for 15-20 minutes, 03-04 times a day for the first 2 to 3 days. After that, ice and heat may be alternated to reduce pain and spasms.  Ask your caregiver about trying back exercises and gentle massage. This may be of some benefit.  Avoid feeling anxious or stressed.Stress increases muscle tension  and can worsen back pain.It is important to recognize when you are anxious or stressed and learn ways to manage it.Exercise is a great option. SEEK MEDICAL CARE IF:  You have pain that is not relieved with rest or medicine.  You have pain that does not improve in 1 week.  You have new symptoms.  You are generally not feeling well. SEEK IMMEDIATE MEDICAL CARE IF:   You have pain that radiates from your back into your legs.  You develop new bowel or bladder control problems.  You have unusual weakness or numbness in your arms or legs.  You develop  nausea or vomiting.  You develop abdominal pain.  You feel faint. Document Released: 02/14/2005 Document Revised: 08/16/2011 Document Reviewed: 07/05/2010 [facility] Patient Information 2014 [place], [place].

## 2013-04-03 NOTE — Progress Notes (Signed)
   Subjective:    Patient ID: Darlene Crosby, female    DOB: 1965/04/08, 48 y.o.   MRN: [id number]  HPI Patient reports with back pain. Having headache and backache. Taking 800 mg motrin, also tried heat and ice. Hardly could even get out of bed today. Coughing hurts; cough is mild and at baseline. No fever. Patient complains of sinus pressure and wheezing. No chest congestion. Cough is productive of green mucous. No dysuria. Back pain is on the right and is along the entire back from periscapular to lumbar. Pain with deep breaths. No N/V/D.   Review of Systems  Constitutional: Negative.   HENT: Positive for sinus pressure. Negative for congestion, ear pain, hearing loss and rhinorrhea.   Eyes: Negative.   Respiratory: Positive for cough and wheezing.   Cardiovascular: Negative.   Gastrointestinal: Negative.   Genitourinary: Negative for dysuria.  Musculoskeletal: Negative for myalgias.  Neurological: Positive for headaches.      Objective:   Physical Exam  Constitutional: She is oriented to person, place, and time. She appears well-developed and well-nourished. She appears distressed.  HENT:  Head: Normocephalic and atraumatic.  Eyes: Pupils are equal, round, and reactive to light. No scleral icterus.  Neck: Neck supple.  Cardiovascular: Normal rate and regular rhythm.   No murmur heard. Pulmonary/Chest: Effort normal and breath sounds normal. No respiratory distress. She has no wheezes. She has no rales.  Musculoskeletal:       Thoracic back: She exhibits decreased range of motion, tenderness, pain and spasm.       Lumbar back: She exhibits decreased range of motion, tenderness, pain and spasm.  Lymphadenopathy:    She has no cervical adenopathy.  Neurological: She is alert and oriented to person, place, and time.  Skin: Skin is warm and dry.  Psychiatric: She has a normal mood and affect. Her behavior is normal.  Patient has intermittent back spasms while I am in the room and  examining her. She holds her back very stiff with movement. She has pain with movement of the back to get on and off the exam table.    Assessment & Plan:  #1. Back pain - Suspect MSK pain rather than pulmonary/PNA/pyelo given normal temp, reproducibility on palpation, diffuse nature of pain, and severe exacerbation with movement - Naproxen bid - Robaxin - Heat and ice - Work note - Return for worsening cough, SOB, fever

## 2013-06-21 ENCOUNTER — Other Ambulatory Visit: Payer: Self-pay | Admitting: Obstetrics and Gynecology

## 2014-01-06 ENCOUNTER — Encounter (HOSPITAL_COMMUNITY): Payer: Self-pay | Admitting: Emergency Medicine

## 2014-01-06 ENCOUNTER — Emergency Department (HOSPITAL_COMMUNITY)
Admission: EM | Admit: 2014-01-06 | Discharge: 2014-01-06 | Disposition: A | Discharge status: Home / Self Care | Payer: BC Managed Care – PPO | Attending: Emergency Medicine | Admitting: Emergency Medicine

## 2014-01-06 ENCOUNTER — Emergency Department (HOSPITAL_COMMUNITY): Payer: BC Managed Care – PPO

## 2014-01-06 DIAGNOSIS — J9811 Atelectasis: Secondary | ICD-10-CM | POA: Diagnosis not present

## 2014-01-06 DIAGNOSIS — Z79899 Other long term (current) drug therapy: Secondary | ICD-10-CM | POA: Insufficient documentation

## 2014-01-06 DIAGNOSIS — R05 Cough: Secondary | ICD-10-CM

## 2014-01-06 DIAGNOSIS — Z791 Long term (current) use of non-steroidal anti-inflammatories (NSAID): Secondary | ICD-10-CM | POA: Diagnosis not present

## 2014-01-06 DIAGNOSIS — R059 Cough, unspecified: Secondary | ICD-10-CM

## 2014-01-06 DIAGNOSIS — J159 Unspecified bacterial pneumonia: Secondary | ICD-10-CM | POA: Diagnosis not present

## 2014-01-06 DIAGNOSIS — J45909 Unspecified asthma, uncomplicated: Secondary | ICD-10-CM | POA: Diagnosis not present

## 2014-01-06 DIAGNOSIS — I10 Essential (primary) hypertension: Secondary | ICD-10-CM | POA: Diagnosis not present

## 2014-01-06 DIAGNOSIS — J189 Pneumonia, unspecified organism: Secondary | ICD-10-CM

## 2014-01-06 MED ORDER — HYDROCOD POLST-CHLORPHEN POLST 10-8 MG/5ML PO LQCR: 5.0000 mL | Freq: Two times a day (BID) | ORAL | Status: DC | PRN

## 2014-01-06 MED ORDER — AZITHROMYCIN 250 MG PO TABS: 250.0000 mg | ORAL_TABLET | Freq: Every day | ORAL | Status: DC

## 2014-01-06 NOTE — ED Notes (Signed)
Bed: WA25 Expected date:  Expected time:  Means of arrival:  Comments: 

## 2014-01-06 NOTE — ED Notes (Signed)
Patient transported to X-ray 

## 2014-01-06 NOTE — ED Notes (Signed)
Pa  at bedside. 

## 2014-01-06 NOTE — Discharge Instructions (Signed)
Return to the emergency room with worsening of symptoms, new symptoms or with symptoms that are concerning, especially fevers, stiff neck, worsening headache, nausea/vomiting, visual changes or slurred speech, chest pain, shortness of breath, cough with thick colored mucous or blood Drink plenty of fluids with electrolytes especially Gatorade. OTC cold medications such as mucinex, nyquil, dayquil are recommended. Chloraseptic for sore throat. Cough syrup for severe cough. Do not operate machinery, drive or drink alcohol while taking narcotics including cough syrup or muscle relaxers. Continue using albuterol inhaler for wheezing or shortness of breath Please take all of your antibiotics until finished!   You may develop abdominal discomfort or diarrhea from the antibiotic.  You may help offset this with probiotics which you can buy or get in yogurt. Do not eat  or take the probiotics until 2 hours after your antibiotic.    USE emergency guide to establish care with primary care provider. Emergency Department Resource Guide 1) Find a Doctor and Pay Out of Pocket Although you won't have to find out who is covered by your insurance plan, it is a good idea to ask around and get recommendations. You will then need to call the office and see if the doctor you have chosen will accept you as a new patient and what types of options they offer for patients who are self-pay. Some doctors offer discounts or will set up payment plans for their patients who do not have insurance, but you will need to ask so you aren't surprised when you get to your appointment.  2) Contact Your Local Health Department Not all health departments have doctors that can see patients for sick visits, but many do, so it is worth a call to see if yours does. If you don't know where your local health department is, you can check in your phone book. The CDC also has a tool to help you locate your state's health department, and many state  websites also have listings of all of their local health departments.  3) Find a [facility] If your illness is not likely to be very severe or complicated, you may want to try a walk in clinic. These are popping up all over the country in pharmacies, drugstores, and shopping centers. They're usually staffed by nurse practitioners or physician assistants that have been trained to treat common illnesses and complaints. They're usually fairly quick and inexpensive. However, if you have serious medical issues or chronic medical problems, these are probably not your best option.  No Primary Care Doctor: - Call Health Connect at  [phone] - they can help you locate a primary care doctor that  accepts your insurance, provides certain services, etc. - Physician Referral Service- [phone]  Chronic Pain Problems: Organization         Address  Phone   Notes  [facility]  [phone] Patients need to be referred by their primary care doctor.   Medication Assistance: Organization         Address  Phone   Notes  [facility] Medication [facility] [address] [place], [place] [address] [phone] --Must be a resident of [facility] -- Must have NO insurance coverage whatsoever (no Medicaid/ Medicare, etc.) -- The pt. MUST have a primary care doctor that directs their care regularly and follows them in the community   MedAssist  [phone]   [employer]  [phone]    Agencies that provide inexpensive medical care: Organization  Address  Phone   Notes  [place]  [phone]   [facility]    [phone]   [facility] [place], [place] [address] [phone]   [place] [address] [place] [phone]   Planned Parenthood    [phone]   [facility]    [phone]   [place] and [place] [address], [place] Phone:  [phone], Fax:  [phone] Hours of Operation:  9 am - 6 pm, M-F.  Also accepts Medicaid/Medicare and self-pay.  [facility] for [place] [address], [address], [place] Phone: [phone], Fax: [phone]. Hours of Operation:  8:30 am - 5:30 pm, M-F.  Also accepts Medicaid and self-pay.  [facility] [place] [address], [place] Phone: [phone]   [place], [place], [place] [phone], Ext. [phone] Mondays & Thursdays: 7-9 AM.  First 15 patients are seen on a first come, first serve basis.    [place] Providers:  Organization         Address  Phone   Notes  [facility] [address],  [phone] Also accepts self-pay patients.  [facility] [address], [place]  [phone]   [place], [address], [place] [phone]   [facility] [address] [place] [phone]   Lucianne Lei [address], [place]   [phone] Only accepts [place] Access [place] patients after they have their name applied to their card.   Self-Pay (no insurance) in [facility]:  Organization         Address  Phone   Notes  Sickle Cell Patients, [facility] [place] [phone]   [facility] [place] [phone]   [facility] [place]  [place], [place], [place] [phone]   [facility]/Dr. Osei-Bonsu  [address], [place] or [address], [place] [phone] Phone number for both [place] and [place] locations is the same.  [facility] [address], [place] [phone]   [facility] [address] [place] or [address] [phone]   [facility] [address] [place] [phone], phone; [phone], fax Sees patients 1st and 3rd Saturday of every month.  Must not qualify for public or private insurance (i.e. Medicaid, Medicare, [place] Health Choice, Veterans' Benefits)  Household income should be no more than 200% of the poverty level The clinic cannot treat you if you are pregnant or think you are pregnant  Sexually transmitted diseases are not treated at the clinic.    Dental Care: Organization         Address  Phone  Notes  [facility] Department of [facility] [place] [phone] Accepts children up to age 23 who are enrolled in [place] or [place]; pregnant women with a Medicaid card; and children who have applied for Medicaid or [place] Health Choice, but were declined, whose parents can pay a reduced fee at time  of service.  [facility] Department of [facility]  [address], [place] [phone] Accepts children up to age 69 who are enrolled in [place] or [place]; pregnant women with a Medicaid card; and children who have applied for Medicaid or  Health Choice, but were declined, whose parents can pay a reduced fee at time of service.  [place] [facility] Access PROGRAM  [place] [phone] Patients are seen by appointment only. Walk-ins are not accepted. [place] will see patients 48 years of age and older. Monday - Tuesday (8am-5pm) Most Wednesdays (8:30-5pm) $30 per visit, cash only  [place] Adult Dental Access PROGRAM  [address], [facility] [phone] Patients are seen by appointment only. Walk-ins are not accepted. [place] will see patients 23 years of age and older. One Wednesday Evening (Monthly: Volunteer Based).  $30 per visit, cash only  [place]  [phone] for adults; Children under age 40, call Graduate Pediatric Dentistry at [phone]. Children aged 36-14, please call [phone] to request a pediatric application.  Dental services are provided in all areas of dental care including fillings, crowns and bridges, complete and partial dentures, implants, gum treatment, root canals, and extractions. Preventive care is also provided. Treatment is provided to both adults and children. Patients are selected via a lottery and there is often a waiting list.   [facility] [address], [place]  [phone] [email or web address]   [facility] [address] [place], [place] [phone], Ext. [phone] Second and Fourth Thursday of each month, opens at 6:30 AM; Clinic ends at 9 AM.  Patients are seen on a first-come first-served basis, and a limited number are seen during each clinic.   [facility]  [address] Hillard Danker [place], [place] [phone]   Eligibility Requirements You must have lived in [place], [place], or [place] counties for at least the last three months.   You cannot be eligible for state or federal sponsored [employer], including [employer], [place], or [employer].   You generally cannot be eligible for healthcare insurance through your employer.    How to apply: Eligibility screenings are held every Tuesday and Wednesday afternoon from 1:00 pm until 4:00 pm. You do not need an appointment for the interview!  [facility] [address] [place], [place]   [place]  [place] Department  [place]  [phone]    Behavioral Health Resources in the Community: Intensive Outpatient Programs Organization         Address  Phone  Notes  [place]. [address] [place], [place]  [phone]   [facility] Outpatient [address] [place], [place]   ADS: Alcohol & Drug Svcs [address], [place], [place]   [place] [address]  [place], [place] or [phone]   Substance Abuse Resources Organization         Address  Phone  Notes  Alcohol and Drug Services  [phone]   [place]  [phone]   [facility] [place]   Chinita Pester  [phone]   Residential & Outpatient Substance Abuse Program  [phone]   Psychological Services Organization         Address  Phone  Notes  [facility]  [place]  [phone]   [place] [address], [place] or [phone]    Mobile Crisis Teams Organization         Address  Phone  Notes  [facility], Mobile Crisis Care Unit  [phone]   [facility]  [address] [place], [place]   [place] [address], [place] [phone]    Self-Help/Support Groups Organization         Address  Phone             Notes  [place]. of [place] - variety of support groups  [place] Call for more information  Narcotics Anonymous (NA), [facility] [address], [employer] [place]  2 meetings at this location   Special educational needs teacher         Address  Phone  Notes  ASAP Residential Treatment [place],    [place]  [phone]   [facility]  [address] [place] [phone], [place], [place]   [place], [place] [phone] Admissions: 8am-3pm M-F  Incentives Substance [place] [address]    [place], [place] [phone]   [facility] [address] [place], [place], [place]   [facility] [address],    [place], [place]   [facility] Programs - Intensive Outpatient [address] Kristeen [address], [place], [place]   [facility] [place].,  [place], [place] [phone] or [phone]   [facility] Services (RTS) [address], [place], [place] Accepts Medicaid  [facility] [place] [address],  [place] [phone] Substance Abuse/Addiction Treatment   [facility] Organization         Address  Phone  Notes  [facility]  [phone]   Domenic Schwab, PhD [address] Arlis [place], [place]   [phone] or [phone]   [place] [address] [place], [place] [phone]   [facility] [address], [place], [place] [phone] Insurance/Medicaid/sponsorship through [facility] and Families [address], [address]                                    [place], [place] [phone] [place] [address], [place] [phone]    Dr. Adele Schilder  [phone]   [facility] Dept. 1) [address], [place] 2) [place] 3)  [place] [address], [place] [phone]  [phone]   [place] [phone] or [phone] (After Hours)

## 2014-01-06 NOTE — ED Notes (Signed)
Pt c/o cough x 3 days, pt has bronchitis. Pt describes sputum as green. Pt c/o body aches.

## 2014-01-06 NOTE — ED Provider Notes (Signed)
CSN: [id number]     Arrival date & time 01/06/14  0923 History   First MD Initiated Contact with Patient 01/06/14 [phone]     Chief Complaint  Patient presents with  . Cough     (Consider location/radiation/quality/duration/timing/severity/associated sxs/prior Treatment) HPI  Darlene Crosby is a 48 y.o. female with PMH of asthma, HTN, allergies presenting with 4 days of productive thick green colored mucus, generalized body aches, sinus congestion and rhinorrhea. Patient denies fevers, chills, chest pain or shortness of breath. Patient denies wheezing. Patient is taken over-the-counter cold medicines with mild relief. Patient states she takes albuterol at home with improvement of her cough. Patient reports not having used her inhaler for the past 10-11 months. No history of smoking. No headache, weakness, nausea, vomiting, abdominal pain, back pain.   Past Medical History  Diagnosis Date  . Hypertension   . Asthma   . Bronchitis   . Allergy    Past Surgical History  Procedure Laterality Date  . Wisdom tooth extraction    . Diagnostic laparoscopy      tubal preg-took ovary and tube  . Mass excision  02/15/2012    Procedure: EXCISION MASS;  Surgeon: Schuyler Amor, MD;  Location: [facility];  Service: Orthopedics;  Laterality: Right;  Excision of Right Small Volar Mass   Family History  Problem Relation Age of Onset  . Hypertension Mother   . Asthma Mother   . Allergies Mother   . Heart disease Sister   . Hypertension Sister   . Stroke Sister   . Diabetes Sister    History  Substance Use Topics  . Smoking status: Never Smoker   . Smokeless tobacco: Never Used  . Alcohol Use: 1.0 oz/week    2 drink(s) per week     Comment: occasionally   OB History    No data available     Review of Systems  Constitutional: Negative for fever and chills.  HENT: Positive for congestion and rhinorrhea.   Eyes: Negative for visual disturbance.  Respiratory: Positive  for cough. Negative for shortness of breath.   Cardiovascular: Negative for chest pain and palpitations.  Gastrointestinal: Negative for nausea, vomiting and diarrhea.  Musculoskeletal: Negative for back pain and gait problem.  Skin: Negative for rash.  Neurological: Negative for weakness and headaches.      Allergies  Chocolate and Strawberry  Home Medications   Prior to Admission medications   Medication Sig Start Date End Date Taking? Authorizing Provider  Acetaminophen-Guaifenesin (THERAFLU FLU/CHEST CONGESTION) 1000-400 MG PACK Take 1 packet by mouth every 4 (four) hours as needed (For cold symptoms.).   Yes Historical Provider, MD  albuterol (PROAIR HFA) 108 (90 BASE) MCG/ACT inhaler Inhale 2 puffs into the lungs every 6 (six) hours as needed for wheezing or shortness of breath.   Yes Historical Provider, MD  Chlorphen-Phenyleph-ASA (ALKA-SELTZER PLUS COLD) 2-7.8-325 MG TBEF Take 2 tablets by mouth every 4 (four) hours as needed (For cold symptoms.).   Yes Historical Provider, MD  irbesartan-hydrochlorothiazide (AVALIDE) 150-12.5 MG per tablet Take 1 tablet by mouth daily. Patient taking differently: Take 2 tablets by mouth every morning.  03/29/13  Yes Tanda Rockers, MD  Multiple Vitamin (MULTIVITAMIN WITH MINERALS) TABS tablet Take 1 tablet by mouth every morning.   Yes Historical Provider, MD  azithromycin (ZITHROMAX) 250 MG tablet Take 1 tablet (250 mg total) by mouth daily. Take first 2 tablets together, then 1 every day until finished. 01/06/14  Pura Spice, PA-C  chlorpheniramine-HYDROcodone (TUSSIONEX PENNKINETIC ER) 10-8 MG/5ML LQCR Take 5 mLs by mouth every 12 (twelve) hours as needed for cough. 01/06/14   Pura Spice, PA-C  methocarbamol (ROBAXIN-750) 750 MG tablet Take 1 tablet (750 mg total) by mouth 3 (three) times daily as needed for muscle spasms. 04/03/13   Duane Boston, MD  Multiple Vitamin (MULTIVITAMIN) tablet Take 1 tablet by mouth daily.    Historical  Provider, MD  naproxen (NAPROSYN) 500 MG tablet Take 1 tablet (500 mg total) by mouth 2 (two) times daily with a meal. 04/03/13   Duane Boston, MD   BP 111/69 mmHg  Pulse 107  Temp(Src) 98.4 F (36.9 C) (Oral)  Resp 18  SpO2 95% Physical Exam  Constitutional: She appears well-developed and well-nourished. No distress.  HENT:  Head: Normocephalic and atraumatic.  Nose: Right sinus exhibits no maxillary sinus tenderness and no frontal sinus tenderness. Left sinus exhibits no maxillary sinus tenderness and no frontal sinus tenderness.  Mouth/Throat: Mucous membranes are normal. Posterior oropharyngeal erythema present. No oropharyngeal exudate or posterior oropharyngeal edema.  Oropharynx with cobblestoning  Eyes: Conjunctivae and EOM are normal. Right eye exhibits no discharge. Left eye exhibits no discharge.  Neck: Normal range of motion. Neck supple.  No nuchal rigidity  Cardiovascular: Normal rate, regular rhythm and normal heart sounds.   Pulmonary/Chest: Effort normal and breath sounds normal. No respiratory distress. She has no wheezes. She has no rales.  Abdominal: Soft. Bowel sounds are normal. She exhibits no distension. There is no tenderness.  Lymphadenopathy:    She has no cervical adenopathy.  Neurological: She is alert.  Skin: Skin is warm and dry. She is not diaphoretic.  Nursing note and vitals reviewed.   ED Course  Procedures (including critical care time) Labs Review Labs Reviewed - No data to display  Imaging Review Dg Chest 2 View  01/06/2014   CLINICAL DATA:  Cough, congestion, and shortness of breath for 3 days ; history of asthmatic bronchitis and allergies  EXAM: CHEST  2 VIEW  COMPARISON:  PA and lateral chest of March 26, 2013.  FINDINGS: The lungs are borderline hypoinflated. The interstitial markings are increased bilaterally. The cardiac silhouette is normal in size. The pulmonary vascularity is not engorged. There is perihilar subsegmental atelectasis.  There is no pleural effusion or pneumothorax. The observed bony thorax is unremarkable.  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 In addition there is perihilar subsegmental atelectasis.   Electronically Signed   By: David  Martinique   On: 01/06/2014 08:08     EKG Interpretation None     Meds given in ED:  Medications - No data to display  New Prescriptions   AZITHROMYCIN (ZITHROMAX) 250 MG TABLET    Take 1 tablet (250 mg total) by mouth daily. Take first 2 tablets together, then 1 every day until finished.   CHLORPHENIRAMINE-HYDROCODONE (TUSSIONEX PENNKINETIC ER) 10-8 MG/5ML LQCR    Take 5 mLs by mouth every 12 (twelve) hours as needed for cough.      MDM   Final diagnoses:  CAP (community acquired pneumonia)  Atelectasis   Patient has been diagnosed with CAP via chest xray. Pt is not ill appearing, immunocompromised, and does not have multiple co morbidities, therefore I feel like the they can be treated as an OP with abx therapy. Continue with albuterol inhaler. Cough syrup with hydrocodone. Driving and sedation precautions provided.  Pt has been advised to return to the ED if symptoms worsen or they do not improve. Pt verbalizes understanding and is agreeable with plan. Patient without a PCP. Patient to establish care and follow up. ED resources provided. Pt appears reliable for follow up and is agreeable to discharge.   Discussed return precautions with patient. Discussed all results and patient verbalizes understanding and agrees with plan.     Pura Spice, PA-C 01/06/14 9379  Wandra Arthurs, MD 01/07/14 [phone]

## 2014-01-09 NOTE — ED Provider Notes (Signed)
Called patient multiple times today, yesterday and the day before on her cell and home phone. Home phone disconnected and cell phone went straight to voice mail. Left a message and pt has not responded. Pt instructed to return to the ED with persistent fevers, worsening cough, SOB, chest pain, hemptysis or worsening symptoms at discharge 01/06/14.  Pura Spice, PA-C 01/09/14 2031  Wandra Arthurs, MD 01/10/14 [phone]

## 2014-02-12 ENCOUNTER — Ambulatory Visit
Admission: RE | Admit: 2014-02-12 | Discharge: 2014-02-12 | Disposition: A | Discharge status: Home / Self Care | Payer: BC Managed Care – PPO | Source: Ambulatory Visit | Attending: Nurse Practitioner | Admitting: Nurse Practitioner

## 2014-02-12 ENCOUNTER — Other Ambulatory Visit: Payer: Self-pay | Admitting: Nurse Practitioner

## 2014-02-12 DIAGNOSIS — Z09 Encounter for follow-up examination after completed treatment for conditions other than malignant neoplasm: Secondary | ICD-10-CM

## 2014-02-24 ENCOUNTER — Ambulatory Visit: Payer: BC Managed Care – PPO | Admitting: Podiatry

## 2014-03-05 ENCOUNTER — Ambulatory Visit (INDEPENDENT_AMBULATORY_CARE_PROVIDER_SITE_OTHER): Payer: BLUE CROSS/BLUE SHIELD | Admitting: Podiatry

## 2014-03-05 ENCOUNTER — Encounter: Payer: Self-pay | Admitting: Podiatry

## 2014-03-05 VITALS — BP 123/62 | HR 78 | Resp 18

## 2014-03-05 DIAGNOSIS — L600 Ingrowing nail: Secondary | ICD-10-CM

## 2014-03-05 DIAGNOSIS — M79676 Pain in unspecified toe(s): Secondary | ICD-10-CM

## 2014-03-05 DIAGNOSIS — L603 Nail dystrophy: Secondary | ICD-10-CM

## 2014-03-05 NOTE — Patient Instructions (Signed)

## 2014-03-05 NOTE — Progress Notes (Signed)
Subjective:    Patient ID: Darlene Crosby, female    DOB: [birth year], 49 y.o.   MRN: [id number]  HPI  49 year old female presents the office today with complaints of fungus on both of her feet along her big toes as well as her fifth toes. She states this been ongoing for greater than 2 years. She states that she previously has seen another podiatrist who gave her some topical treatment to applied to the area however she did not notice any difference after applying the medication. She states that she has pain particularly over the right big toe, and points to the lateral aspect of the nail border. She states that the border of the nail is taking the skin causing significant pain particularly with certain shoe gear and pressure. She denies any redness or drainage around the nail sites. No other complaints at this time.   Review of Systems  Constitutional: Positive for appetite change.  Musculoskeletal: Positive for back pain.       JOINT PAIN  Allergic/Immunologic: Positive for food allergies.  All other systems reviewed and are negative.      Objective:   Physical Exam AAO x3, NAD DP/PT pulses palpable bilaterally, CRT less than 3 seconds Protective sensation intact with Simms Weinstein monofilament, vibratory sensation intact, Achilles tendon reflex intact Bilateral hallux and fifth digit toenails are hypertrophic, dystrophic, elongated, brittle, discolored. The remainder of the toenails have toenail polish on which could not be removed. There is tenderness to palpation overlying the lateral aspect of the right hallux nail border with significant amount of thickness and ingrowing along the nail border. There is noted drainage or purulence expressed. There is no swelling erythema, edema, increase in warmth. Currently, there is no clinical signs of infection. No significant ingrowing on the remaining aspect of the nails. No other open lesions or pre-ulcerative lesions are identified. No  areas of pinpoint bony tenderness or pain with vibratory sensation. MMT 5/5, ROM WNL No pain with calf compression, swelling, warmth, erythema.     Assessment & Plan:  49 year old female with bilateral hallux/fifth digit onychodystrophy, likely onychomycosis; symptomatic ingrown toenail right lateral hallux nail border -Treatment options were discussed with the patient including alternatives, risks, complications. -At this time discussed various treatment options for onychomycosis however as the patient has had treatment previously without any resolution will biopsy the nail. We will await the results of the biopsy before proceeding with treatment. Biopsy sent to [facility] labs.  -For the symptomatically ingrown toenail on the right lateral hallux nail border discussed various treatment options including debridement and possible partial nail avulsion with or without chemical matrixectomy. At this time, the patient has elected to proceed with partial nail avulsion with chemical matrixectomy. Risks and, complications of the procedure were discussed with the patient for which she understands and verbally consents to the procedure. After the site was properly identified, under sterile conditions a total of 2 mL of a one-to-one mixture of 2% lidocaine plain and 0.5% Marcaine plain was infiltrated in a hallux block fashion on the right side. An additional 1 mL was infiltrated during the procedure to ensure anesthesia. Once the area was anesthetized the skin was prepped in a sterile fashion. Tourniquet was applied. Next the lateral aspect of the right hallux nail was sharply excised making sure to remove the entire offending nail border. There is found to be a significant amount of ingrowing along the proximal lateral nail border. There is no purulence identified. The underlying skin was intact.  Once the nail was then ensured to be removed, phenol was applied under standard conditions and then copiously irrigated.  Silvadene was applied followed by dry sterile dressing. After application of the dressing there is found to be an immediate capillary refill time to the digit. The patient tolerated the procedure well without any complications. Post procedure instructions were discussed the patient for which she verbally understood. Monitor for any clinical signs or symptoms of infection and directed to call the office immediately should any occur or go directly to the emergency room. -Follow-up in one week for nail check or sooner should any palms arise. In the meantime, she was encouraged to call the office with any questions, concerns, change in symptoms.

## 2014-03-07 ENCOUNTER — Other Ambulatory Visit: Payer: Self-pay | Admitting: Podiatry

## 2014-03-07 DIAGNOSIS — M79676 Pain in unspecified toe(s): Secondary | ICD-10-CM

## 2014-03-07 MED ORDER — TRAMADOL HCL 50 MG PO TABS: 50.0000 mg | ORAL_TABLET | Freq: Three times a day (TID) | ORAL | Status: DC | PRN

## 2014-03-07 MED ORDER — TRAMADOL HCL 50 MG PO TABS: 50.0000 mg | ORAL_TABLET | Freq: Two times a day (BID) | ORAL | Status: DC

## 2014-03-12 ENCOUNTER — Encounter: Payer: Self-pay | Admitting: Podiatry

## 2014-03-12 ENCOUNTER — Ambulatory Visit (INDEPENDENT_AMBULATORY_CARE_PROVIDER_SITE_OTHER): Payer: BLUE CROSS/BLUE SHIELD | Admitting: Podiatry

## 2014-03-12 VITALS — BP 103/70 | HR 90 | Resp 18

## 2014-03-12 DIAGNOSIS — M79676 Pain in unspecified toe(s): Secondary | ICD-10-CM

## 2014-03-12 DIAGNOSIS — L600 Ingrowing nail: Secondary | ICD-10-CM

## 2014-03-12 NOTE — Progress Notes (Signed)
Patient ID: Darlene Crosby, female   DOB: Oct 13, 1965, 49 y.o.   MRN: [id number]  Subjective: 49 year old female returns the office today one-week status post right lateral hallux partial nail avulsion with chemical matricectomy. She states that she has had no surrounding erythema or ascending cellulitis. There is been no purulence. She periodically will get a small amount of bloody drainage. She does that she has some mild tenderness over the area particularly with shoe gear and she has had to wear surgical shoe. She does feel that the discomfort is resolving. She has been soaking the foot twice a day Epson salts and covering with antibiotic ointment and a Band-Aid. She takes Ultram once a day if needed for pain. Denies any systemic complaints such as fevers, chills, nausea, vomiting. No acute changes since last appointment, and no other complaints at this time.   Objective: AAO x3, NAD DP/PT pulses palpable bilaterally, CRT less than 3 seconds Protective sensation intact with Simms Weinstein monofilament, vibratory sensation intact, Achilles tendon reflex intact Status post right lateral hallux partial nail avulsion which appears to be healing well for this timeframe. There is a small amount of hyperkeratotic tissue within the nail border. There is no sign edema, erythema, increase in warmth. No ascending cellulitis. No purulence identified. There is mild tenderness to palpation directly overlying the procedure site. No clinical signs of infection at this time. The remaining nails without pathology. No areas of pinpoint bony tenderness or pain with vibratory sensation. MMT 5/5, ROM WNL. No edema, erythema, increase in warmth to bilateral lower extremities.  No open lesions or pre-ulcerative lesions.  No pain with calf compression, swelling, warmth, erythema  Assessment: 49 year old female 1 week status post right lateral hallux partial nail avulsion, with mild discomfort.  Plan: -All treatment  options discussed with the patient including all alternatives, risks, complications.  -At this time continue soaking with Epson salt twice a day followed by antibiotic ointment and a Band-Aid. Can leave the area uncovered at night. Continue this until the area has completely healed. Monitor for any clinical signs or symptoms of infection and directed to call the office immediately if any are to occur. -All up in 2 weeks if the area has not healed or sooner should any problems arise. -Patient encouraged to call the office with any questions, concerns, change in symptoms.

## 2014-03-12 NOTE — Patient Instructions (Signed)
Continue soaking in epsom salts twice a day followed by antibiotic ointment and a band-aid. Can leave uncovered at night. Continue this until completely healed.  Monitor for any signs/symptoms of infection. Call the office immediately if any occur or go directly to the emergency room. Call with any questions/concerns.  

## 2014-03-26 ENCOUNTER — Encounter: Payer: Self-pay | Admitting: Podiatry

## 2014-03-26 ENCOUNTER — Ambulatory Visit (INDEPENDENT_AMBULATORY_CARE_PROVIDER_SITE_OTHER): Payer: BLUE CROSS/BLUE SHIELD | Admitting: Podiatry

## 2014-03-26 VITALS — BP 103/68 | HR 82 | Resp 18

## 2014-03-26 DIAGNOSIS — B351 Tinea unguium: Secondary | ICD-10-CM

## 2014-03-26 DIAGNOSIS — L600 Ingrowing nail: Secondary | ICD-10-CM

## 2014-03-31 MED ORDER — EFINACONAZOLE 10 % EX SOLN: 1.0000 [drp] | Freq: Every day | CUTANEOUS | Status: DC

## 2014-03-31 NOTE — Progress Notes (Signed)
Patient ID: Darlene Crosby, female   DOB: [birth year], 49 y.o.   MRN: [id number]  Subjective: 49 year old female presents the office today status post right lateral hallux partial nail avulsion. She states that since last appointment she is doing much better. She has been able to continue to wear his regular shoe without any problems. She denies any drainage or purulence from the area. Denies any surrounding erythema or streaking. No tenderness at this time overlying the nail procedure site. Denies any systemic complaints such as fevers, chills, nausea, vomiting. No acute changes since last appointment, and no other complaints at this time.   Objective: AAO x3, NAD DP/PT pulses palpable bilaterally, CRT less than 3 seconds Protective sensation intact with Simms Weinstein monofilament, vibratory sensation intact, Achilles tendon reflex intact Procedure site on the lateral aspect of the right hallux is well healed at this time. There is no tenderness to palpation overlying the area and there is no surrounding erythema or ascending cellulitis. No drainage is identified. Bilateral hallux and fifth digit nails are hypertrophic, dystrophic, discolored, brittle. No swelling erythema or drainage around the nail sites. No other areas of pinpoint bony tenderness or pain with vibratory sensation. MMT 5/5, ROM WNL. No edema, erythema, increase in warmth to bilateral lower extremities.  No open lesions or pre-ulcerative lesions.  No pain with calf compression, swelling, warmth, erythema  Assessment: 49 year old female status post right lateral hallux partial nail avulsion, healed; likely onychomycosis   Plan: -All treatment options discussed with the patient including all alternatives, risks, complications.  -At this time the procedure site is healed. She can discontinue the soaking and covering with antibiotic ointment and a Band-Aid at this time however she can continue if she feels as needed. Continue to  monitor for any signs or symptoms of infection or reoccurrence. -5 not yet received the results of the nail biopsy. I will call the patient once the results are obtained. I did discuss with her today multiple treatment options for onychomycosis. If the results do reveal onychomycosis she has elected to proceed with topical treatment. We'll likely prescribe Jublia. I discussed with her risks, occasions of the medication and directed to call the office should any occur. -Follow-up as needed. -Patient encouraged to call the office with any questions, concerns, change in symptoms.   *Addendum- I received the results the nail biopsy and I spoke to the patient on 03/31/2014 an approximate 5 PM. I discussed with her the nail biopsy results and at this time we'll proceed with Jublia. A prescription for this was sent to the patients pharmacy.

## 2014-04-01 ENCOUNTER — Encounter: Payer: Self-pay | Admitting: Podiatry

## 2014-05-23 ENCOUNTER — Ambulatory Visit (INDEPENDENT_AMBULATORY_CARE_PROVIDER_SITE_OTHER): Payer: BLUE CROSS/BLUE SHIELD | Admitting: Family Medicine

## 2014-05-23 VITALS — BP 102/82 | HR 77 | Temp 98.0°F | Resp 16 | Ht 65.5 in | Wt 210.6 lb

## 2014-05-23 DIAGNOSIS — K1379 Other lesions of oral mucosa: Secondary | ICD-10-CM | POA: Diagnosis not present

## 2014-05-23 DIAGNOSIS — K029 Dental caries, unspecified: Secondary | ICD-10-CM | POA: Diagnosis not present

## 2014-05-23 DIAGNOSIS — K088 Other specified disorders of teeth and supporting structures: Secondary | ICD-10-CM

## 2014-05-23 DIAGNOSIS — K0889 Other specified disorders of teeth and supporting structures: Secondary | ICD-10-CM

## 2014-05-23 MED ORDER — TRAMADOL HCL 50 MG PO TABS: 50.0000 mg | ORAL_TABLET | Freq: Two times a day (BID) | ORAL | Status: DC

## 2014-05-23 MED ORDER — AMOXICILLIN 500 MG PO CAPS: 500.0000 mg | ORAL_CAPSULE | Freq: Three times a day (TID) | ORAL | Status: DC

## 2014-05-23 MED ORDER — IBUPROFEN 600 MG PO TABS: 600.0000 mg | ORAL_TABLET | Freq: Four times a day (QID) | ORAL | Status: DC | PRN

## 2014-05-23 NOTE — Progress Notes (Signed)
This chart was scribed for Merri Ray, MD by Edison Simon, ED Scribe. This patient was seen in room 12 and the patient's care was started at 5:07 PM.   Subjective:    Patient ID: Darlene Crosby, female    DOB: 03-Feb-1966, 48 y.o.   MRN: [id number]  Chief Complaint  Patient presents with  . Dental Pain    x 2 day    HPI  HPI Comments: Darlene Crosby is a 49 y.o. female who presents to the [facility] complaining of right, front, upper dental pain with onset 2 days ago. She states this tooth has been broken for some time; she states she was eating something the other day and a piece of it came off. She has called [facility] but cannot be seen until next week. She has used Advil without improvement. She denies fever.  Patient Active Problem List   Diagnosis Date Noted  . Dyspnea 03/26/2013  . 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 HTN (hypertension) 06/07/2012  . Pyogenic granuloma 02/15/2012   Past Medical History  Diagnosis Date  . Hypertension   . Asthma   . Bronchitis   . Allergy    Past Surgical History  Procedure Laterality Date  . Wisdom tooth extraction    . Diagnostic laparoscopy      tubal preg-took ovary and tube  . Mass excision  02/15/2012    Procedure: EXCISION MASS;  Surgeon: Schuyler Amor, MD;  Location: [facility];  Service: Orthopedics;  Laterality: Right;  Excision of Right Small Volar Mass   Allergies  Allergen Reactions  . Chocolate Hives  . Strawberry Hives   Prior to Admission medications   Medication Sig Start Date End Date Taking? Authorizing Provider  Acetaminophen-Guaifenesin (THERAFLU FLU/CHEST CONGESTION) 1000-400 MG PACK Take 1 packet by mouth every 4 (four) hours as needed (For cold symptoms.).   Yes Historical Provider, MD  albuterol (PROAIR HFA) 108 (90 BASE) MCG/ACT inhaler Inhale 2 puffs into the lungs every 6 (six) hours as needed for wheezing or shortness of breath.   Yes Historical Provider, MD    Chlorphen-Phenyleph-ASA (ALKA-SELTZER PLUS COLD) 2-7.8-325 MG TBEF Take 2 tablets by mouth every 4 (four) hours as needed (For cold symptoms.).   Yes Historical Provider, MD  irbesartan-hydrochlorothiazide (AVALIDE) 150-12.5 MG per tablet Take 1 tablet by mouth daily. 03/29/13  Yes Tanda Rockers, MD  Multiple Vitamin (MULTIVITAMIN WITH MINERALS) TABS tablet Take 1 tablet by mouth every morning.   Yes Historical Provider, MD  Multiple Vitamin (MULTIVITAMIN) tablet Take 1 tablet by mouth daily.   Yes Historical Provider, MD  traMADol (ULTRAM) 50 MG tablet Take 1 tablet (50 mg total) by mouth 2 (two) times daily. 03/07/14  Yes Trula Slade, DPM  azithromycin (ZITHROMAX) 250 MG tablet Take 1 tablet (250 mg total) by mouth daily. Take first 2 tablets together, then 1 every day until finished. Patient not taking: Reported on 03/05/2014 01/06/14   Al Corpus, PA-C  chlorpheniramine-HYDROcodone [facility]) 10-8 MG/5ML LQCR Take 5 mLs by mouth every 12 (twelve) hours as needed for cough. Patient not taking: Reported on 05/23/2014 01/06/14   Al Corpus, PA-C  Efinaconazole 10 % SOLN Apply 1 drop topically daily. Patient not taking: Reported on 05/23/2014 03/31/14   Trula Slade, DPM  meloxicam [facility]) 7.5 MG tablet  03/01/14   Historical Provider, MD  methocarbamol (ROBAXIN-750) 750 MG tablet Take 1 tablet (750 mg total) by  mouth 3 (three) times daily as needed for muscle spasms. Patient not taking: Reported on 03/05/2014 04/03/13   Duane Boston, MD  naproxen (NAPROSYN) 500 MG tablet Take 1 tablet (500 mg total) by mouth 2 (two) times daily with a meal. Patient not taking: Reported on 03/05/2014 04/03/13   Duane Boston, MD   History   Social History  . Marital Status: Widowed    Spouse Name: N/A  . Number of Children: N/A  . Years of Education: N/A   Occupational History  . Not on file.   Social History Main Topics  . Smoking status: Never Smoker   . Smokeless tobacco: Never Used   . Alcohol Use: 1.0 oz/week    2 drink(s) per week     Comment: occasionally  . Drug Use: No  . Sexual Activity: No   Other Topics Concern  . Not on file   Social History Narrative      Review of Systems  Constitutional: Negative for fever.  HENT: Positive for dental problem.        Objective:   Physical Exam  Constitutional: She is oriented to person, place, and time. She appears well-developed and well-nourished.  HENT:  Head: Normocephalic and atraumatic.  Few scattered areas of missing teeth Few scattered areas of decay with fillings Upper lateral incisor is decayed with lateral third missing, minimal swelling of gum at base of tooth only, no exudate, no apparent abscess Minimal soft tissue swelling into right nasolabial fold, otherwise no appreciable facial swelling  Eyes: Conjunctivae are normal.  Neck: Normal range of motion. Neck supple.  Pulmonary/Chest: Effort normal.  Musculoskeletal: Normal range of motion.  Neurological: She is alert and oriented to person, place, and time.  Skin: Skin is warm and dry.  Psychiatric: She has a normal mood and affect.  Vitals reviewed.   Filed Vitals:   05/23/14 1700  BP: 102/82  Pulse: 77  Temp: 98 F (36.7 C)  TempSrc: Oral  Resp: 16  Height: 5' 5.5" (1.664 m)  Weight: 210 lb 9.6 oz (95.528 kg)  SpO2: 99%        Assessment & Plan:   Darlene Crosby is a 49 y.o. female Mouth pain - Plan: traMADol (ULTRAM) 50 MG tablet, ibuprofen (ADVIL,MOTRIN) 600 MG tablet, amoxicillin (AMOXIL) 500 MG capsule  Tooth pain - Plan: traMADol (ULTRAM) 50 MG tablet, ibuprofen (ADVIL,MOTRIN) 600 MG tablet, amoxicillin (AMOXIL) 500 MG capsule  Dental caries - Plan: traMADol (ULTRAM) 50 MG tablet, ibuprofen (ADVIL,MOTRIN) 600 MG tablet, amoxicillin (AMOXIL) 500 MG capsule  Broken tooth due to decay of #10 tooth (R lateral incisor), with some immediate surrounding gum pain, but no abscess seen.   -cover with amoxicillin, ibuprofen  and tramadol if needed for pain  -follow up with dentist first of the week, but if any increased pain or swelling in area, face swelling, fever or chills - go to emergency room or here to r/o periodontal abscess. Understanding expressed.   Meds ordered this encounter  Medications  . traMADol (ULTRAM) 50 MG tablet    Sig: Take 1 tablet (50 mg total) by mouth 2 (two) times daily.    Dispense:  30 tablet    Refill:  0  . ibuprofen (ADVIL,MOTRIN) 600 MG tablet    Sig: Take 1 tablet (600 mg total) by mouth every 6 (six) hours as needed (with food).    Dispense:  30 tablet    Refill:  0  . amoxicillin (AMOXIL) 500 MG  capsule    Sig: Take 1 capsule (500 mg total) by mouth 3 (three) times daily.    Dispense:  30 capsule    Refill:  0   Patient Instructions  Start amoxicillin to cover in case there is an early infection, but I do not see one at this time. Ibuprofen up to every 6 hours as needed with food. Tramadol if needed for more severe pain. Follow up with dentist as soon as possible.  If any increased mouth swelling, fever or chills or worsening - return here or emergency room.   Contact information of other dentist if needed:  [email or web address] [phone] [place], [place] [address]      I personally performed the services described in this documentation, which was scribed in my presence. The recorded information has been reviewed and considered, and addended by me as needed.

## 2014-05-23 NOTE — Patient Instructions (Addendum)
Start amoxicillin to cover in case there is an early infection, but I do not see one at this time. Ibuprofen up to every 6 hours as needed with food. Tramadol if needed for more severe pain. Follow up with dentist as soon as possible.  If any increased mouth swelling, fever or chills or worsening - return here or emergency room.   Contact information of other dentist if needed:  [email or web address] [phone]  [place], [place] [id number]

## 2014-11-28 DIAGNOSIS — N62 Hypertrophy of breast: Secondary | ICD-10-CM | POA: Insufficient documentation

## 2014-12-27 ENCOUNTER — Ambulatory Visit (INDEPENDENT_AMBULATORY_CARE_PROVIDER_SITE_OTHER): Payer: BLUE CROSS/BLUE SHIELD | Admitting: Family Medicine

## 2014-12-27 VITALS — BP 144/84 | HR 110 | Temp 99.5°F | Ht 65.5 in | Wt 218.4 lb

## 2014-12-27 DIAGNOSIS — K529 Noninfective gastroenteritis and colitis, unspecified: Secondary | ICD-10-CM

## 2014-12-27 LAB — POCT URINALYSIS DIP (MANUAL ENTRY)
GLUCOSE UA: NEGATIVE
Ketones, POC UA: NEGATIVE
Leukocytes, UA: NEGATIVE
NITRITE UA: NEGATIVE
Protein Ur, POC: 30 — AB
Spec Grav, UA: >=1.03
UROBILINOGEN UA: 1
pH, UA: 5.5

## 2014-12-27 LAB — COMPREHENSIVE METABOLIC PANEL
ALT: 20 U/L (ref 6–29)
AST: 18 U/L (ref 10–35)
Albumin: 3.7 g/dL (ref 3.6–5.1)
Alkaline Phosphatase: 74 U/L (ref 33–130)
BUN: 14 mg/dL (ref 7–25)
CHLORIDE: 100 mmol/L (ref 98–110)
CO2: 26 mmol/L (ref 20–31)
Calcium: 8.6 mg/dL (ref 8.6–10.4)
Creat: 0.96 mg/dL (ref 0.50–1.05)
Glucose, Bld: 110 mg/dL — ABNORMAL HIGH (ref 65–99)
POTASSIUM: 3.4 mmol/L — AB (ref 3.5–5.3)
Sodium: 134 mmol/L — ABNORMAL LOW (ref 135–146)
Total Bilirubin: 0.6 mg/dL (ref 0.2–1.2)
Total Protein: 6.9 g/dL (ref 6.1–8.1)

## 2014-12-27 LAB — POCT CBC
GRANULOCYTE PERCENT: 79 % (ref 37–80)
HCT, POC: 40.5 % (ref 37.7–47.9)
Hemoglobin: 13.9 g/dL (ref 12.2–16.2)
Lymph, poc: 1.1 (ref 0.6–3.4)
MCH: 28.6 pg (ref 27–31.2)
MCHC: 34.2 g/dL (ref 31.8–35.4)
MCV: 83.6 fL (ref 80–97)
MID (cbc): 0.5 (ref 0–0.9)
MPV: 5.7 fL (ref 0–99.8)
POC Granulocyte: 5.8 (ref 2–6.9)
POC LYMPH PERCENT: 14.2 %L (ref 10–50)
POC MID %: 6.8 % (ref 0–12)
Platelet Count, POC: 260 10*3/uL (ref 142–424)
RBC: 4.85 M/uL (ref 4.04–5.48)
RDW, POC: 13.2 %
WBC: 7.4 10*3/uL (ref 4.6–10.2)

## 2014-12-27 LAB — POC MICROSCOPIC URINALYSIS (UMFC)

## 2014-12-27 MED ORDER — ONDANSETRON 8 MG PO TBDP: 8.0000 mg | ORAL_TABLET | Freq: Three times a day (TID) | ORAL | Status: DC | PRN

## 2014-12-27 MED ORDER — DICYCLOMINE HCL 10 MG PO CAPS: 10.0000 mg | ORAL_CAPSULE | Freq: Three times a day (TID) | ORAL | Status: DC

## 2014-12-27 NOTE — Patient Instructions (Addendum)
Viral Gastroenteritis Viral gastroenteritis is also known as stomach flu. This condition affects the stomach and intestinal tract. It can cause sudden diarrhea and vomiting. The illness typically lasts 3 to 8 days. Most people develop an immune response that eventually gets rid of the virus. While this natural response develops, the virus can make you quite ill. CAUSES  Many different viruses can cause gastroenteritis, such as rotavirus or noroviruses. You can catch one of these viruses by consuming contaminated food or water. You may also catch a virus by sharing utensils or other personal items with an infected person or by touching a contaminated surface. SYMPTOMS  The most common symptoms are diarrhea and vomiting. These problems can cause a severe loss of body fluids (dehydration) and a body salt (electrolyte) imbalance. Other symptoms may include:  Fever.  Headache.  Fatigue.  Abdominal pain. DIAGNOSIS  Your caregiver can usually diagnose viral gastroenteritis based on your symptoms and a physical exam. A stool sample may also be taken to test for the presence of viruses or other infections. TREATMENT  This illness typically goes away on its own. Treatments are aimed at rehydration. The most serious cases of viral gastroenteritis involve vomiting so severely that you are not able to keep fluids down. In these cases, fluids must be given through an intravenous line (IV). HOME CARE INSTRUCTIONS   Drink enough fluids to keep your urine clear or pale yellow. Drink small amounts of fluids frequently and increase the amounts as tolerated.  Ask your caregiver for specific rehydration instructions.  Avoid:  Foods high in sugar.  Alcohol.  Carbonated drinks.  Tobacco.  Juice.  Caffeine drinks.  Extremely hot or cold fluids.  Fatty, greasy foods.  Too much intake of anything at one time.  Dairy products until 24 to 48 hours after diarrhea stops.  You may consume probiotics.  Probiotics are active cultures of beneficial bacteria. They may lessen the amount and number of diarrheal stools in adults. Probiotics can be found in yogurt with active cultures and in supplements.  Wash your hands well to avoid spreading the virus.  Only take over-the-counter or prescription medicines for pain, discomfort, or fever as directed by your caregiver. Do not give aspirin to children. Antidiarrheal medicines are not recommended.  Ask your caregiver if you should continue to take your regular prescribed and over-the-counter medicines.  Keep all follow-up appointments as directed by your caregiver. SEEK IMMEDIATE MEDICAL CARE IF:   You are unable to keep fluids down.  You do not urinate at least once every 6 to 8 hours.  You develop shortness of breath.  You notice blood in your stool or vomit. This may look like coffee grounds.  You have abdominal pain that increases or is concentrated in one small area (localized).  You have persistent vomiting or diarrhea.  You have a fever.  The patient is a child younger than 3 months, and he or she has a fever.  The patient is a child older than 3 months, and he or she has a fever and persistent symptoms.  The patient is a child older than 3 months, and he or she has a fever and symptoms suddenly get worse.  The patient is a baby, and he or she has no tears when crying. MAKE SURE YOU:   Understand these instructions.  Will watch your condition.  Will get help right away if you are not doing well or get worse.   This information is not intended to replace  advice given to you by your health care provider. Make sure you discuss any questions you have with your health care provider.   Document Released: 02/14/2005 Document Revised: 05/09/2011 Document Reviewed: 12/01/2010 Elsevier Interactive Patient Education 2016 Elsevier [employer] Dehydration, Adult Dehydration is a condition in which you do not have enough fluid or water in  your body. It happens when you take in less fluid than you lose. Vital organs such as the kidneys, brain, and heart cannot function without a proper amount of fluids. Any loss of fluids from the body can cause dehydration.  Dehydration can range from mild to severe. This condition should be treated right away to help prevent it from becoming severe. CAUSES  This condition may be caused by:  Vomiting.  Diarrhea.  Excessive sweating, such as when exercising in hot or humid weather.  Not drinking enough fluid during strenuous exercise or during an illness.  Excessive urine output.  Fever.  Certain medicines. RISK FACTORS This condition is more likely to develop in:  People who are taking certain medicines that cause the body to lose excess fluid (diuretics).   People who have a chronic illness, such as diabetes, that may increase urination.  Older adults.   People who live at high altitudes.   People who participate in endurance sports.  SYMPTOMS  Mild Dehydration  Thirst.  Dry lips.  Slightly dry mouth.  Dry, warm skin. Moderate Dehydration  Very dry mouth.   Muscle cramps.   Dark urine and decreased urine production.   Decreased tear production.   Headache.   Light-headedness, especially when you stand up from a sitting position.  Severe Dehydration  Changes in skin.   Cold and clammy skin.   Skin does not spring back quickly when lightly pinched and released.   Changes in body fluids.   Extreme thirst.   No tears.   Not able to sweat when body temperature is high, such as in hot weather.   Minimal urine production.   Changes in vital signs.   Rapid, weak pulse (more than 100 beats per minute when you are sitting still).   Rapid breathing.   Low blood pressure.   Other changes.   Sunken eyes.   Cold hands and feet.   Confusion.  Lethargy and difficulty being awakened.  Fainting (syncope).   Short-term  weight loss.   Unconsciousness. DIAGNOSIS  This condition may be diagnosed based on your symptoms. You may also have tests to determine how severe your dehydration is. These tests may include:   Urine tests.   Blood tests.  TREATMENT  Treatment for this condition depends on the severity. Mild or moderate dehydration can often be treated at home. Treatment should be started right away. Do not wait until dehydration becomes severe. Severe dehydration needs to be treated at the hospital. Treatment for Mild Dehydration  Drinking plenty of water to replace the fluid you have lost.   Replacing minerals in your blood (electrolytes) that you may have lost.  Treatment for Moderate Dehydration  Consuming oral rehydration solution (ORS). Treatment for Severe Dehydration  Receiving fluid through an IV tube.   Receiving electrolyte solution through a feeding tube that is passed through your nose and into your stomach (nasogastric tube or NG tube).  Correcting any abnormalities in electrolytes. HOME CARE INSTRUCTIONS   Drink enough fluid to keep your urine clear or pale yellow.   Drink water or fluid slowly by taking small sips. You can also try sucking on  ice cubes.  Have food or beverages that contain electrolytes. Examples include bananas and sports drinks.  Take over-the-counter and prescription medicines only as told by your health care provider.   Prepare ORS according to the manufacturer's instructions. Take sips of ORS every 5 minutes until your urine returns to normal.  If you have vomiting or diarrhea, continue to try to drink water, ORS, or both.   If you have diarrhea, avoid:   Beverages that contain caffeine.   Fruit juice.   Milk.   Carbonated soft drinks.  Do not take salt tablets. This can lead to the condition of having too much sodium in your body (hypernatremia).  SEEK MEDICAL CARE IF:  You cannot eat or drink without vomiting.  You have  had moderate diarrhea during a period of more than 24 hours.  You have a fever. SEEK IMMEDIATE MEDICAL CARE IF:   You have extreme thirst.  You have severe diarrhea.  You have not urinated in 6-8 hours, or you have urinated only a small amount of very dark urine.  You have shriveled skin.  You are dizzy, confused, or both.   This information is not intended to replace advice given to you by your health care provider. Make sure you discuss any questions you have with your health care provider.   Document Released: 02/14/2005 Document Revised: 11/05/2014 Document Reviewed: 07/02/2014 Elsevier Interactive Patient Education 2016 [place] Choices to Help Relieve Diarrhea, Adult When you have diarrhea, the foods you eat and your eating habits are very important. Choosing the right foods and drinks can help relieve diarrhea. Also, because diarrhea can last up to 7 days, you need to replace lost fluids and electrolytes (such as sodium, potassium, and chloride) in order to help prevent dehydration.  WHAT GENERAL GUIDELINES DO I NEED TO FOLLOW?  Slowly drink 1 cup (8 oz) of fluid for each episode of diarrhea. If you are getting enough fluid, your urine will be clear or pale yellow.  Eat starchy foods. Some good choices include white rice, white toast, pasta, low-fiber cereal, baked potatoes (without the skin), saltine crackers, and bagels.  Avoid large servings of any cooked vegetables.  Limit fruit to two servings per day. A serving is  cup or 1 small piece.  Choose foods with less than 2 g of fiber per serving.  Limit fats to less than 8 tsp (38 g) per day.  Avoid fried foods.  Eat foods that have probiotics in them. Probiotics can be found in certain dairy products.  Avoid foods and beverages that may increase the speed at which food moves through the stomach and intestines (gastrointestinal tract). Things to avoid include:  High-fiber foods, such as dried fruit, raw  fruits and vegetables, nuts, seeds, and whole grain foods.  Spicy foods and high-fat foods.  Foods and beverages sweetened with high-fructose corn syrup, honey, or sugar alcohols such as xylitol, sorbitol, and mannitol. WHAT FOODS ARE RECOMMENDED? Grains White rice. White, [place], or pita breads (fresh or toasted), including plain rolls, buns, or bagels. White pasta. Saltine, soda, or graham crackers. Pretzels. Low-fiber cereal. Cooked cereals made with water (such as cornmeal, farina, or cream cereals). Plain muffins. Matzo. Melba toast. Zwieback.  Vegetables Potatoes (without the skin). Strained tomato and vegetable juices. Most well-cooked and canned vegetables without seeds. Tender lettuce. Fruits Cooked or canned applesauce, apricots, cherries, fruit cocktail, grapefruit, peaches, pears, or plums. Fresh bananas, apples without skin, cherries, grapes, cantaloupe, grapefruit, peaches, oranges, or plums.  Meat and Other Protein Products Baked or boiled chicken. Eggs. Tofu. Fish. Seafood. Smooth peanut butter. Ground or well-cooked tender beef, ham, veal, lamb, pork, or poultry.  Dairy Plain yogurt, kefir, and unsweetened liquid yogurt. Lactose-free milk, buttermilk, or soy milk. Plain hard cheese. Beverages Sport drinks. Clear broths. Diluted fruit juices (except prune). Regular, caffeine-free sodas such as ginger ale. Water. Decaffeinated teas. Oral rehydration solutions. Sugar-free beverages not sweetened with sugar alcohols. Other Bouillon, broth, or soups made from recommended foods.  The items listed above may not be a complete list of recommended foods or beverages. Contact your dietitian for more options. WHAT FOODS ARE NOT RECOMMENDED? Grains Whole grain, whole wheat, bran, or rye breads, rolls, pastas, crackers, and cereals. Wild or brown rice. Cereals that contain more than 2 g of fiber per serving. Corn tortillas or taco shells. Cooked or dry oatmeal. Granola.  Popcorn. Vegetables Raw vegetables. Cabbage, broccoli, Brussels sprouts, artichokes, baked beans, beet greens, corn, kale, legumes, peas, sweet potatoes, and yams. Potato skins. Cooked spinach and cabbage. Fruits Dried fruit, including raisins and dates. Raw fruits. Stewed or dried prunes. Fresh apples with skin, apricots, mangoes, pears, raspberries, and strawberries.  Meat and Other Protein Products Chunky peanut butter. Nuts and seeds. Beans and lentils. Berniece Salines.  Dairy High-fat cheeses. Milk, chocolate milk, and beverages made with milk, such as milk shakes. Cream. Ice cream. Sweets and Desserts Sweet rolls, doughnuts, and sweet breads. Pancakes and waffles. Fats and Oils Butter. Cream sauces. Margarine. Salad oils. Plain salad dressings. Olives. Avocados.  Beverages Caffeinated beverages (such as coffee, tea, soda, or energy drinks). Alcoholic beverages. Fruit juices with pulp. Prune juice. Soft drinks sweetened with high-fructose corn syrup or sugar alcohols. Other Coconut. Hot sauce. Chili powder. Mayonnaise. Gravy. Cream-based or milk-based soups.  The items listed above may not be a complete list of foods and beverages to avoid. Contact your dietitian for more information. WHAT SHOULD I DO IF I BECOME DEHYDRATED? Diarrhea can sometimes lead to dehydration. Signs of dehydration include dark urine and dry mouth and skin. If you think you are dehydrated, you should rehydrate with an oral rehydration solution. These solutions can be purchased at pharmacies, retail stores, or online.  Drink -1 cup (120-240 mL) of oral rehydration solution each time you have an episode of diarrhea. If drinking this amount makes your diarrhea worse, try drinking smaller amounts more often. For example, drink 1-3 tsp (5-15 mL) every 5-10 minutes.  A general rule for staying hydrated is to drink 1-2 L of fluid per day. Talk to your health care provider about the specific amount you should be drinking each day.  Drink enough fluids to keep your urine clear or pale yellow.   This information is not intended to replace advice given to you by your health care provider. Make sure you discuss any questions you have with your health care provider.   Document Released: 05/07/2003 Document Revised: 03/07/2014 Document Reviewed: 01/07/2013 Elsevier Interactive Patient Education [employer].

## 2014-12-27 NOTE — Progress Notes (Signed)
Subjective:    Patient ID: Darlene Crosby, female    DOB: [birth year], 49 y.o.   MRN: [id number] This chart was scribed for Delman Cheadle, MD by Marti Sleigh, Medical Scribe. This patient was seen in Room 1 and the patient's care was started a 9:57 AM.  Chief Complaint  Patient presents with  . Diarrhea    x 1 day  . Emesis  . Chills  . Generalized Body Aches    HPI HPI Comments: Darlene Crosby is a 49 y.o. female who presents to [facility] complaining of diarrhea, emesis, and myalgias for the last 24 hours. She has not vomited since she ate a sausage biscuit yesterday. She endorses associated fevers (102.7), chills, lightheadedness, and dizziness. She denies current nausea, vaginal discharge or vaginal pain. She has been drinking water regularly, and states her urine is yellow. She has not been able to eat since yesterday. She had a colonoscopy last year, which was normal. She denies blood in stool or black tarry stools, or blood or black material in vomit.   Past Medical History  Diagnosis Date  . Hypertension   . Asthma   . Bronchitis   . Allergy    Allergies  Allergen Reactions  . Chocolate Hives  . Strawberry Extract Hives   Current Outpatient Prescriptions on File Prior to Visit  Medication Sig Dispense Refill  . Chlorphen-Phenyleph-ASA (ALKA-SELTZER PLUS COLD) 2-7.8-325 MG TBEF Take 2 tablets by mouth every 4 (four) hours as needed (For cold symptoms.).    Marland Kitchen irbesartan-hydrochlorothiazide (AVALIDE) 150-12.5 MG per tablet Take 1 tablet by mouth daily. 30 tablet 5  . Multiple Vitamin (MULTIVITAMIN) tablet Take 1 tablet by mouth daily.     No current facility-administered medications on file prior to visit.    Review of Systems  Constitutional: Positive for fever, chills, diaphoresis, activity change, appetite change and fatigue. Negative for unexpected weight change.  Gastrointestinal: Positive for vomiting and diarrhea. Negative for nausea, abdominal pain, constipation,  blood in stool, anal bleeding and rectal pain.  Genitourinary: Negative for dysuria, urgency, vaginal discharge, difficulty urinating, vaginal pain and pelvic pain.  Musculoskeletal: Positive for myalgias. Negative for gait problem.  Allergic/Immunologic: Negative for immunocompromised state.  Neurological: Positive for dizziness, weakness and light-headedness. Negative for syncope.       Objective:  BP 144/84 mmHg  Pulse 110  Temp(Src) 99.5 F (37.5 C) (Oral)  Ht 5' 5.5" (1.664 m)  Wt 218 lb 6.4 oz (99.066 kg)  BMI 35.78 kg/m2  SpO2 98%  Physical Exam  Constitutional: She is oriented to person, place, and time. She appears well-developed and well-nourished. No distress.  HENT:  Head: Normocephalic and atraumatic.  Eyes: Pupils are equal, round, and reactive to light.  Neck: Neck supple.  Cardiovascular: Normal rate.   Tachycardic, with regular rhythm. Normal S1, S2. Bowel sounds heard in chest.  Pulmonary/Chest: Effort normal and breath sounds normal. No respiratory distress. She has no wheezes.  Abdominal: Soft.  Right more than left CVA tenderness. Hyperactive bowel sounds. Generalized tenderness, worse in the upper quadrants.  Musculoskeletal: Normal range of motion.  Neurological: She is alert and oriented to person, place, and time. Coordination normal.  Skin: Skin is warm and dry. She is not diaphoretic.  Psychiatric: She has a normal mood and affect. Her behavior is normal.  Nursing note and vitals reviewed.     Assessment & Plan:   1. Acute gastroenteritis   Suspect viral etiology and seems to be improving - sxs  largely due to dehydration so reviewed diet recs, push fluids, rtc for recheck w/ pelvic/rectal/IVFs if sxs persist  Orders Placed This Encounter  Procedures  . Urine culture  . Comprehensive metabolic panel  . POCT Microscopic Urinalysis ([facility])  . POCT urinalysis dipstick  . POCT CBC    Meds ordered this encounter  Medications  . dicyclomine  (BENTYL) 10 MG capsule    Sig: Take 1 capsule (10 mg total) by mouth 4 (four) times daily -  before meals and at bedtime.    Dispense:  40 capsule    Refill:  0  . ondansetron (ZOFRAN-ODT) 8 MG disintegrating tablet    Sig: Take 1 tablet (8 mg total) by mouth every 8 (eight) hours as needed for nausea.    Dispense:  30 tablet    Refill:  0    I personally performed the services described in this documentation, which was scribed in my presence. The recorded information has been reviewed and considered, and addended by me as needed.  Delman Cheadle, MD MPH   By signing my name below, I, Judithe Modest, attest that this documentation has been prepared under the direction and in the presence of Delman Cheadle, MD. Electronically Signed: Judithe Modest, ER Scribe. 12/27/2014. 9:57 AM.   Results for orders placed or performed in visit on 12/27/14  Urine culture  Result Value Ref Range   Colony Count >=100,000 COLONIES/ML    Organism ID, Bacteria Multiple bacterial morphotypes present, none    Organism ID, Bacteria predominant. Suggest appropriate recollection if     Organism ID, Bacteria clinically indicated.   Comprehensive metabolic panel  Result Value Ref Range   Sodium 134 (L) 135 - 146 mmol/L   Potassium 3.4 (L) 3.5 - 5.3 mmol/L   Chloride 100 98 - 110 mmol/L   CO2 26 20 - 31 mmol/L   Glucose, Bld 110 (H) 65 - 99 mg/dL   BUN 14 7 - 25 mg/dL   Creat 0.96 0.50 - 1.05 mg/dL   Total Bilirubin 0.6 0.2 - 1.2 mg/dL   Alkaline Phosphatase 74 33 - 130 U/L   AST 18 10 - 35 U/L   ALT 20 6 - 29 U/L   Total Protein 6.9 6.1 - 8.1 g/dL   Albumin 3.7 3.6 - 5.1 g/dL   Calcium 8.6 8.6 - 10.4 mg/dL  POCT Microscopic Urinalysis ([facility])  Result Value Ref Range   WBC,UR,HPF,POC Moderate (A) None WBC/hpf   RBC,UR,HPF,POC Few (A) None RBC/hpf   Bacteria Few (A) None, Too numerous to count   Mucus Present (A) Absent   Epithelial Cells, UR Per Microscopy Moderate (A) None, Too numerous to count cells/hpf   POCT urinalysis dipstick  Result Value Ref Range   Color, UA yellow yellow   Clarity, UA hazy (A) clear   Glucose, UA negative negative   Bilirubin, UA small (A) negative   Ketones, POC UA negative negative   Spec Grav, UA >=1.030    Blood, UA trace-intact (A) negative   pH, UA 5.5    Protein Ur, POC =30 (A) negative   Urobilinogen, UA 1.0    Nitrite, UA Negative Negative   Leukocytes, UA Negative Negative  POCT CBC  Result Value Ref Range   WBC 7.4 4.6 - 10.2 K/uL   Lymph, poc 1.1 0.6 - 3.4   POC LYMPH PERCENT 14.2 10 - 50 %L   MID (cbc) 0.5 0 - 0.9   POC MID % 6.8 0 - 12 %  M   POC Granulocyte 5.8 2 - 6.9   Granulocyte percent 79.0 37 - 80 %G   RBC 4.85 4.04 - 5.48 M/uL   Hemoglobin 13.9 12.2 - 16.2 g/dL   HCT, POC 40.5 37.7 - 47.9 %   MCV 83.6 80 - 97 fL   MCH, POC 28.6 27 - 31.2 pg   MCHC 34.2 31.8 - 35.4 g/dL   RDW, POC 13.2 %   Platelet Count, POC 260 142 - 424 K/uL   MPV 5.7 0 - 99.8 fL

## 2014-12-28 LAB — URINE CULTURE: Colony Count: >=100000

## 2014-12-29 ENCOUNTER — Telehealth: Payer: Self-pay

## 2014-12-29 ENCOUNTER — Encounter: Payer: Self-pay | Admitting: *Deleted

## 2014-12-29 NOTE — Telephone Encounter (Signed)
Note written to return 11/1.  Needs to return to be seen if cannot work tomorrow

## 2014-12-29 NOTE — Telephone Encounter (Signed)
Pt was seen on 12/27/14 and missed work today for the same illness. She would like a note for work stating that she can return tomorrow, 12/30/14.

## 2015-05-27 ENCOUNTER — Other Ambulatory Visit: Payer: Self-pay | Admitting: Family Medicine

## 2015-05-27 ENCOUNTER — Other Ambulatory Visit: Payer: Self-pay | Admitting: *Deleted

## 2015-05-27 ENCOUNTER — Ambulatory Visit
Admission: RE | Admit: 2015-05-27 | Discharge: 2015-05-27 | Disposition: A | Discharge status: Home / Self Care | Payer: BLUE CROSS/BLUE SHIELD | Source: Ambulatory Visit | Attending: *Deleted | Admitting: *Deleted

## 2015-05-27 DIAGNOSIS — R52 Pain, unspecified: Secondary | ICD-10-CM

## 2015-06-12 ENCOUNTER — Encounter (HOSPITAL_BASED_OUTPATIENT_CLINIC_OR_DEPARTMENT_OTHER): Payer: Self-pay

## 2015-06-12 ENCOUNTER — Emergency Department (HOSPITAL_BASED_OUTPATIENT_CLINIC_OR_DEPARTMENT_OTHER): Payer: BLUE CROSS/BLUE SHIELD

## 2015-06-12 ENCOUNTER — Emergency Department (HOSPITAL_BASED_OUTPATIENT_CLINIC_OR_DEPARTMENT_OTHER)
Admission: EM | Admit: 2015-06-12 | Discharge: 2015-06-12 | Disposition: A | Discharge status: Home / Self Care | Payer: BLUE CROSS/BLUE SHIELD | Attending: Emergency Medicine | Admitting: Emergency Medicine

## 2015-06-12 DIAGNOSIS — J45909 Unspecified asthma, uncomplicated: Secondary | ICD-10-CM | POA: Diagnosis not present

## 2015-06-12 DIAGNOSIS — B349 Viral infection, unspecified: Secondary | ICD-10-CM | POA: Diagnosis not present

## 2015-06-12 DIAGNOSIS — I10 Essential (primary) hypertension: Secondary | ICD-10-CM | POA: Insufficient documentation

## 2015-06-12 DIAGNOSIS — Z79899 Other long term (current) drug therapy: Secondary | ICD-10-CM | POA: Insufficient documentation

## 2015-06-12 DIAGNOSIS — R52 Pain, unspecified: Secondary | ICD-10-CM | POA: Diagnosis present

## 2015-06-12 MED ORDER — ACETAMINOPHEN 325 MG PO TABS
650.0000 mg | ORAL_TABLET | Freq: Once | ORAL | Status: AC
  Administered 2015-06-12: 650 mg via ORAL
  Filled 2015-06-12: qty 2

## 2015-06-12 NOTE — ED Provider Notes (Signed)
CSN: [id number]     Arrival date & time 06/12/15  1234 History   First MD Initiated Contact with Patient 06/12/15 1340     Chief Complaint  Patient presents with  . Generalized Body Aches   (Consider location/radiation/quality/duration/timing/severity/associated sxs/prior Treatment) HPI 50 y.o. female with a hx of pneumonia, presents to the Emergency Department today complaining of body aches since last night. Associated fever, headache, cough, rhinorrhea, sore throat. No N/V/D. No CP/SOB/ABD pain. Has tried OTC tylenol with minimal relief. No sick contacts. Notes pain 8/10 currently and generalized. No other symptoms noted  Past Medical History  Diagnosis Date  . Hypertension   . Asthma   . Bronchitis   . Allergy    Past Surgical History  Procedure Laterality Date  . Wisdom tooth extraction    . Diagnostic laparoscopy      tubal preg-took ovary and tube  . Mass excision  02/15/2012    Procedure: EXCISION MASS;  Surgeon: Schuyler Amor, MD;  Location: [facility];  Service: Orthopedics;  Laterality: Right;  Excision of Right Small Volar Mass   Family History  Problem Relation Age of Onset  . Hypertension Mother   . Asthma Mother   . Allergies Mother   . Heart disease Sister   . Hypertension Sister   . Stroke Sister   . Diabetes Sister    Social History  Substance Use Topics  . Smoking status: Never Smoker   . Smokeless tobacco: Never Used  . Alcohol Use: No   OB History    No data available     Review of Systems ROS reviewed and all are negative for acute change except as noted in the HPI.  Allergies  Chocolate; Strawberry extract; and Wheat bran  Home Medications   Prior to Admission medications   Medication Sig Start Date End Date Taking? Authorizing Provider  irbesartan-hydrochlorothiazide (AVALIDE) 150-12.5 MG per tablet Take 1 tablet by mouth daily. 03/29/13   Tanda Rockers, MD  Multiple Vitamin (MULTIVITAMIN) tablet Take 1 tablet by  mouth daily.    Historical Provider, MD   BP 124/89 mmHg  Pulse 108  Temp(Src) 101 F (38.3 C) (Oral)  Resp 20  Ht 5\' 5"  (1.651 m)  Wt 95.255 kg  BMI 34.95 kg/m2  SpO2 98%  LMP 05/04/2012   Physical Exam  Constitutional: She is oriented to person, place, and time. She appears well-developed and well-nourished. No distress.  HENT:  Head: Normocephalic and atraumatic.  Right Ear: Tympanic membrane, external ear and ear canal normal.  Left Ear: Tympanic membrane, external ear and ear canal normal.  Nose: Nose normal.  Mouth/Throat: Uvula is midline, oropharynx is clear and moist and mucous membranes are normal. No trismus in the jaw. No oropharyngeal exudate, posterior oropharyngeal erythema or tonsillar abscesses.  Eyes: EOM are normal. Pupils are equal, round, and reactive to light.  Neck: Normal range of motion. Neck supple. No tracheal deviation present.  Cardiovascular: Normal rate, regular rhythm, S1 normal, S2 normal, normal heart sounds, intact distal pulses and normal pulses.   Pulmonary/Chest: Effort normal and breath sounds normal. No respiratory distress. She has no decreased breath sounds. She has no wheezes. She has no rhonchi. She has no rales.  Abdominal: Soft. Normal appearance and bowel sounds are normal. There is no tenderness.  Musculoskeletal: Normal range of motion.  Neurological: She is alert and oriented to person, place, and time.  Skin: Skin is warm and dry.  Psychiatric: She has a normal  mood and affect. Her speech is normal and behavior is normal. Thought content normal.  Nursing note and vitals reviewed.  ED Course  Procedures (including critical care time) Labs Review Labs Reviewed - No data to display  Imaging Review Dg Chest 2 View  06/12/2015  CLINICAL DATA:  Cough, fever and congestion for 2 days, history hypertension, asthma and bronchitis EXAM: CHEST  2 VIEW COMPARISON:  02/12/2014 FINDINGS: Normal heart size, mediastinal contours, and pulmonary  vascularity. Bronchitic changes accentuation of perihilar markings. No pulmonary infiltrate, pleural effusion or pneumothorax. No acute osseous findings. IMPRESSION: Bronchitic changes without acute infiltrate. Electronically Signed   By: Lavonia Dana M.D.   On: 06/12/2015 14:02   I have personally reviewed and evaluated these images and lab results as part of my medical decision-making.   EKG Interpretation None      MDM   I have reviewed and evaluated the relevant imaging studies. I have reviewed the relevant previous healthcare records. I obtained HPI from historian.  ED Course:  Assessment: Pt is a 50yF presents with URI symptoms since yesterday . On exam, pt in NAD. VSS. Temp 101F. Lungs CTA, Heart RRR. Abdomen nontender/soft. Pt CXR negative for acute infiltrate. Patients symptoms are consistent with URI, likely viral etiology. Discussed that antibiotics are not indicated for viral infections. Pt will be discharged with symptomatic treatment.  Verbalizes understanding and is agreeable with plan. Pt is hemodynamically stable & in NAD prior to dc.  Disposition/Plan:  DC Home Additional Verbal discharge instructions given and discussed with patient.  Pt Instructed to f/u with PCP in the next week for evaluation and treatment of symptoms. Return precautions given Pt acknowledges and agrees with plan  Supervising Physician Quintella Reichert, MD   Final diagnoses:  Viral syndrome       Shary Decamp, PA-C 06/12/15 [facility], MD 06/13/15 [phone]

## 2015-06-12 NOTE — ED Notes (Signed)
C/o body aches, HA, dry cough x today-NAD-steady gait

## 2015-06-12 NOTE — Discharge Instructions (Signed)
Please read and follow all provided instructions.  Your diagnoses today include:  1. Viral syndrome    You appear to have an upper respiratory infection (URI). An upper respiratory tract infection, or cold, is a viral infection of the air passages leading to the lungs. It should improve gradually after 5-7 days. You may have a lingering cough that lasts for 2- 4 weeks after the infection.  Tests performed today include:  Vital signs. See below for your results today.   Medications prescribed:   Take any prescribed medications only as directed. Treatment for your infection is aimed at treating the symptoms. There are no medications, such as antibiotics, that will cure your infection.   Home care instructions:  Follow any educational materials contained in this packet.   Your illness is contagious and can be spread to others, especially during the first 3 or 4 days. It cannot be cured by antibiotics or other medicines. Take basic precautions such as washing your hands often, covering your mouth when you cough or sneeze, and avoiding public places where you could spread your illness to others.   Please continue drinking plenty of fluids.  Use over-the-counter medicines as needed as directed on packaging for symptom relief.  You may also use ibuprofen or tylenol as directed on packaging for pain or fever.  Do not take multiple medicines containing Tylenol or acetaminophen to avoid taking too much of this medication.  Follow-up instructions: Please follow-up with your primary care provider in the next 3 days for further evaluation of your symptoms if you are not feeling better.   Return instructions:   Please return to the Emergency Department if you experience worsening symptoms.   RETURN IMMEDIATELY IF you develop shortness of breath, confusion or altered mental status, a new rash, become dizzy, faint, or poorly responsive, or are unable to be cared for at home.  Please return if you have  persistent vomiting and cannot keep down fluids or develop a fever that is not controlled by tylenol or motrin.    Please return if you have any other emergent concerns.  Additional Information:  Your vital signs today were: BP 124/89 mmHg   Pulse 108   Temp(Src) 101 F (38.3 C) (Oral)   Resp 20   Ht 5\' 5"  (1.651 m)   Wt 95.255 kg   BMI 34.95 kg/m2   SpO2 98%   LMP 05/04/2012 If your blood pressure (BP) was elevated above 135/85 this visit, please have this repeated by your doctor within one month. --------------

## 2015-09-02 ENCOUNTER — Emergency Department (HOSPITAL_COMMUNITY): Payer: BLUE CROSS/BLUE SHIELD

## 2015-09-02 ENCOUNTER — Emergency Department (HOSPITAL_COMMUNITY)
Admission: EM | Admit: 2015-09-02 | Discharge: 2015-09-02 | Disposition: A | Discharge status: Home / Self Care | Payer: BLUE CROSS/BLUE SHIELD | Attending: Emergency Medicine | Admitting: Emergency Medicine

## 2015-09-02 DIAGNOSIS — Y929 Unspecified place or not applicable: Secondary | ICD-10-CM | POA: Diagnosis not present

## 2015-09-02 DIAGNOSIS — W0110XA Fall on same level from slipping, tripping and stumbling with subsequent striking against unspecified object, initial encounter: Secondary | ICD-10-CM | POA: Insufficient documentation

## 2015-09-02 DIAGNOSIS — Z79899 Other long term (current) drug therapy: Secondary | ICD-10-CM | POA: Insufficient documentation

## 2015-09-02 DIAGNOSIS — I10 Essential (primary) hypertension: Secondary | ICD-10-CM | POA: Insufficient documentation

## 2015-09-02 DIAGNOSIS — Y999 Unspecified external cause status: Secondary | ICD-10-CM | POA: Insufficient documentation

## 2015-09-02 DIAGNOSIS — S8992XA Unspecified injury of left lower leg, initial encounter: Secondary | ICD-10-CM | POA: Diagnosis present

## 2015-09-02 DIAGNOSIS — J45909 Unspecified asthma, uncomplicated: Secondary | ICD-10-CM | POA: Diagnosis not present

## 2015-09-02 DIAGNOSIS — S60221A Contusion of right hand, initial encounter: Secondary | ICD-10-CM | POA: Insufficient documentation

## 2015-09-02 DIAGNOSIS — S8002XA Contusion of left knee, initial encounter: Secondary | ICD-10-CM | POA: Insufficient documentation

## 2015-09-02 DIAGNOSIS — Y939 Activity, unspecified: Secondary | ICD-10-CM | POA: Diagnosis not present

## 2015-09-02 MED ORDER — DICLOFENAC SODIUM 50 MG PO TBEC: 50.0000 mg | DELAYED_RELEASE_TABLET | Freq: Two times a day (BID) | ORAL | Status: DC

## 2015-09-02 NOTE — ED Provider Notes (Signed)
CSN: [id number]     Arrival date & time 09/02/15  1124 History   First MD Initiated Contact with Patient 09/02/15 1148     Chief Complaint  Patient presents with  . Knee Pain     (Consider location/radiation/quality/duration/timing/severity/associated sxs/prior Treatment) Patient is a 50 y.o. female presenting with knee pain. The history is provided by the patient. No language interpreter was used.  Knee Pain Location:  Knee Time since incident:  1 day Injury: no   Knee location:  L knee Pain details:    Quality:  Aching   Radiates to:  Does not radiate   Severity:  Moderate   Onset quality:  Gradual   Duration:  1 day   Timing:  Constant   Progression:  Worsening Chronicity:  New Dislocation: no   Foreign body present:  No foreign bodies Tetanus status:  Up to date Prior injury to area:  No Relieved by:  Nothing Worsened by:  Nothing tried Ineffective treatments:  None tried Associated symptoms: swelling   Risk factors: recent illness   Pt reports she slipped in water at [place] and hit left knee.  Pt also has a bruise on her left hand.  Pt reports a little soreness to hand.  Pt has pain and swelling to left knee.  Past Medical History  Diagnosis Date  . Hypertension   . Asthma   . Bronchitis   . Allergy    Past Surgical History  Procedure Laterality Date  . Wisdom tooth extraction    . Diagnostic laparoscopy      tubal preg-took ovary and tube  . Mass excision  02/15/2012    Procedure: EXCISION MASS;  Surgeon: Schuyler Amor, MD;  Location: [place];  Service: Orthopedics;  Laterality: Right;  Excision of Right Small Volar Mass   Family History  Problem Relation Age of Onset  . Hypertension Mother   . Asthma Mother   . Allergies Mother   . Heart disease Sister   . Hypertension Sister   . Stroke Sister   . Diabetes Sister    Social History  Substance Use Topics  . Smoking status: Never Smoker   . Smokeless tobacco: Never Used  .  Alcohol Use: No   OB History    No data available     Review of Systems  All other systems reviewed and are negative.     Allergies  Chocolate; Strawberry extract; and Wheat bran  Home Medications   Prior to Admission medications   Medication Sig Start Date End Date Taking? Authorizing Provider  irbesartan-hydrochlorothiazide (AVALIDE) 150-12.5 MG per tablet Take 1 tablet by mouth daily. 03/29/13   Tanda Rockers, MD  Multiple Vitamin (MULTIVITAMIN) tablet Take 1 tablet by mouth daily.    Historical Provider, MD   BP 142/99 mmHg  Pulse 113  Temp(Src) 98.2 F (36.8 C) (Oral)  Resp 20  Ht 5\' 5"  (1.651 m)  Wt 113.399 kg  BMI 41.60 kg/m2  SpO2 96%  LMP 05/04/2012 Physical Exam  Constitutional: She is oriented to person, place, and time. She appears well-developed and well-nourished.  HENT:  Head: Normocephalic.  Eyes: Conjunctivae and EOM are normal. Pupils are equal, round, and reactive to light.  Neck: Normal range of motion.  Cardiovascular: Normal rate and normal heart sounds.   Pulmonary/Chest: Effort normal.  Abdominal: She exhibits no distension.  Musculoskeletal: She exhibits tenderness.  Tender left knee,  Pain with movement,  nv and ns intact  Right hand  slight bruising dorsal hand.   Neurological: She is alert and oriented to person, place, and time.  Skin: Skin is warm.  Psychiatric: She has a normal mood and affect.  Nursing note and vitals reviewed.   ED Course  Procedures (including critical care time) Labs Review Labs Reviewed - No data to display  Imaging Review No results found. I have personally reviewed and evaluated these images and lab results as part of my medical decision-making.   EKG Interpretation None      MDM   Final diagnoses:  Contusion of left knee, initial encounter  Contusion of right hand, initial encounter    Meds ordered this encounter  Medications  . diclofenac (VOLTAREN) 50 MG EC tablet    Sig: Take 1 tablet  (50 mg total) by mouth 2 (two) times daily.    Dispense:  20 tablet    Refill:  0    Order Specific Question:  Supervising Provider    Answer:  Noemi Chapel [[id number]]  An After Visit Summary was printed and given to the patient.    Hollace Kinnier [place], PA-C 09/02/15 [place], MD 09/03/15 [phone]

## 2015-09-02 NOTE — ED Notes (Addendum)
In [place] and slipped on water. C/O left knee and leg pain. Pain level of 10. Also c/o "mild" R hand pain that presented this am.

## 2015-09-02 NOTE — Discharge Instructions (Signed)

## 2015-10-27 ENCOUNTER — Other Ambulatory Visit: Payer: Self-pay | Admitting: Obstetrics & Gynecology

## 2015-10-29 LAB — CYTOLOGY - PAP

## 2016-02-29 HISTORY — PX: TRIGGER FINGER RELEASE: SHX641

## 2017-03-30 DIAGNOSIS — M65311 Trigger thumb, right thumb: Secondary | ICD-10-CM | POA: Insufficient documentation

## 2018-01-10 DIAGNOSIS — M25562 Pain in left knee: Secondary | ICD-10-CM | POA: Insufficient documentation

## 2018-02-23 DIAGNOSIS — M48061 Spinal stenosis, lumbar region without neurogenic claudication: Secondary | ICD-10-CM | POA: Insufficient documentation

## 2018-04-05 NOTE — Progress Notes (Signed)
Need orders for 2-26 surgery in epic

## 2018-04-06 ENCOUNTER — Ambulatory Visit: Payer: Self-pay | Admitting: Orthopedic Surgery

## 2018-04-16 ENCOUNTER — Ambulatory Visit: Payer: Self-pay | Admitting: Orthopedic Surgery

## 2018-04-16 NOTE — H&P (View-Only) (Signed)
TOTAL HIP ADMISSION H&P  Patient is admitted for left total hip arthroplasty.  Subjective:  Chief Complaint: left hip pain  HPI: Darlene Crosby, 53 y.o. female, has a history of pain and functional disability in the left hip(s) due to arthritis and patient has failed non-surgical conservative treatments for greater than 12 weeks to include NSAID's and/or analgesics, flexibility and strengthening excercises, use of assistive devices, weight reduction as appropriate and activity modification.  Onset of symptoms was gradual starting 2 years ago with rapidlly worsening course since that time.The patient noted no past surgery on the left hip(s).  Patient currently rates pain in the left hip at 10 out of 10 with activity. Patient has night pain, worsening of pain with activity and weight bearing, trendelenberg gait, pain that interfers with activities of daily living and pain with passive range of motion. Patient has evidence of subchondral cysts, subchondral sclerosis, periarticular osteophytes and joint space narrowing by imaging studies. This condition presents safety issues increasing the risk of falls.  There is no current active infection.  Patient Active Problem List   Diagnosis Date Noted  . Dyspnea 03/26/2013  . 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 HTN (hypertension) 06/07/2012  . Pyogenic granuloma 02/15/2012   Past Medical History:  Diagnosis Date  . Allergy   . Asthma   . Bronchitis   . Hypertension     Past Surgical History:  Procedure Laterality Date  . DIAGNOSTIC LAPAROSCOPY     tubal preg-took ovary and tube  . MASS EXCISION  02/15/2012   Procedure: EXCISION MASS;  Surgeon: Schuyler Amor, MD;  Location: [facility];  Service: Orthopedics;  Laterality: Right;  Excision of Right Small Volar Mass  . WISDOM TOOTH EXTRACTION      Current Outpatient Medications  Medication Sig Dispense Refill Last Dose  . diclofenac (VOLTAREN) 50 MG EC tablet Take 1 tablet (50 mg  total) by mouth 2 (two) times daily. 20 tablet 0   . irbesartan-hydrochlorothiazide (AVALIDE) 150-12.5 MG per tablet Take 1 tablet by mouth daily. 30 tablet 5 09/02/2015 at 0800  . Multiple Vitamin (MULTIVITAMIN) tablet Take 1 tablet by mouth daily.   09/02/2015 at 0800   No current facility-administered medications for this visit.    Allergies  Allergen Reactions  . Pineapple Shortness Of Breath and Swelling    Swelling of tongue   . Chocolate Hives  . Strawberry Extract Hives  . Wheat Bran Hives    Social History   Tobacco Use  . Smoking status: Never Smoker  . Smokeless tobacco: Never Used  Substance Use Topics  . Alcohol use: No    Family History  Problem Relation Age of Onset  . Hypertension Mother   . Asthma Mother   . Allergies Mother   . Heart disease Sister   . Hypertension Sister   . Stroke Sister   . Diabetes Sister      Review of Systems  Constitutional: Negative.   HENT: Negative.   Eyes: Negative.   Respiratory: Negative.   Cardiovascular: Negative.   Gastrointestinal: Negative.   Genitourinary: Negative.   Musculoskeletal: Positive for back pain and joint pain.  Skin: Negative.   Neurological: Negative.   Endo/Heme/Allergies: Negative.   Psychiatric/Behavioral: Negative.     Objective:  Physical Exam  Vitals reviewed. Constitutional: She is oriented to person, place, and time. She appears well-developed and well-nourished.  HENT:  Head: Normocephalic and atraumatic.  Eyes: Pupils are equal, round, and reactive to light.  Conjunctivae and EOM are normal.  Neck: Normal range of motion. Neck supple.  Cardiovascular: Normal rate, regular rhythm and intact distal pulses.  Respiratory: Effort normal. No respiratory distress.  GI: Soft. She exhibits no distension.  Genitourinary:    Genitourinary Comments: deferred   Neurological: She is alert and oriented to person, place, and time. She has normal reflexes.  Skin: Skin is warm and dry.  Psychiatric:  She has a normal mood and affect. Her behavior is normal. Judgment and thought content normal.    Vital signs in last 24 hours: @VSRANGES @  Labs:   Estimated body mass index is 41.6 kg/m as calculated from the following:   Height as of 09/02/15: 5\' 5"  (1.651 m).   Weight as of 09/02/15: 113.4 kg.   Imaging Review Plain radiographs demonstrate severe degenerative joint disease of the left hip(s). The bone quality appears to be adequate for age and reported activity level.      Assessment/Plan:  End stage arthritis, left hip(s)  The patient history, physical examination, clinical judgement of the provider and imaging studies are consistent with end stage degenerative joint disease of the left hip(s) and total hip arthroplasty is deemed medically necessary. The treatment options including medical management, injection therapy, arthroscopy and arthroplasty were discussed at length. The risks and benefits of total hip arthroplasty were presented and reviewed. The risks due to aseptic loosening, infection, stiffness, dislocation/subluxation,  thromboembolic complications and other imponderables were discussed.  The patient acknowledged the explanation, agreed to proceed with the plan and consent was signed. Patient is being admitted for inpatient treatment for surgery, pain control, PT, OT, prophylactic antibiotics, VTE prophylaxis, progressive ambulation and ADL's and discharge planning.The patient is planning to be discharged home with HEP    Patient's anticipated LOS is less than 2 midnights, meeting these requirements: - Younger than 25 - Lives within 1 hour of care - Has a competent adult at home to recover with post-op recover - NO history of  - Chronic pain requiring opiods  - Diabetes  - Coronary Artery Disease  - Heart failure  - Heart attack  - Stroke  - DVT/VTE  - Cardiac arrhythmia  - Respiratory Failure/COPD  - Renal failure  - Anemia  - Advanced Liver  disease

## 2018-04-16 NOTE — H&P (Signed)
TOTAL HIP ADMISSION H&P  Patient is admitted for left total hip arthroplasty.  Subjective:  Chief Complaint: left hip pain  HPI: Darlene Crosby, 53 y.o. female, has a history of pain and functional disability in the left hip(s) due to arthritis and patient has failed non-surgical conservative treatments for greater than 12 weeks to include NSAID's and/or analgesics, flexibility and strengthening excercises, use of assistive devices, weight reduction as appropriate and activity modification.  Onset of symptoms was gradual starting 2 years ago with rapidlly worsening course since that time.The patient noted no past surgery on the left hip(s).  Patient currently rates pain in the left hip at 10 out of 10 with activity. Patient has night pain, worsening of pain with activity and weight bearing, trendelenberg gait, pain that interfers with activities of daily living and pain with passive range of motion. Patient has evidence of subchondral cysts, subchondral sclerosis, periarticular osteophytes and joint space narrowing by imaging studies. This condition presents safety issues increasing the risk of falls.  There is no current active infection.  Patient Active Problem List   Diagnosis Date Noted  . Dyspnea 03/26/2013  . 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 HTN (hypertension) 06/07/2012  . Pyogenic granuloma 02/15/2012   Past Medical History:  Diagnosis Date  . Allergy   . Asthma   . Bronchitis   . Hypertension     Past Surgical History:  Procedure Laterality Date  . DIAGNOSTIC LAPAROSCOPY     tubal preg-took ovary and tube  . MASS EXCISION  02/15/2012   Procedure: EXCISION MASS;  Surgeon: Schuyler Amor, MD;  Location: [facility];  Service: Orthopedics;  Laterality: Right;  Excision of Right Small Volar Mass  . WISDOM TOOTH EXTRACTION      Current Outpatient Medications  Medication Sig Dispense Refill Last Dose  . diclofenac (VOLTAREN) 50 MG EC tablet Take 1 tablet (50 mg  total) by mouth 2 (two) times daily. 20 tablet 0   . irbesartan-hydrochlorothiazide (AVALIDE) 150-12.5 MG per tablet Take 1 tablet by mouth daily. 30 tablet 5 09/02/2015 at 0800  . Multiple Vitamin (MULTIVITAMIN) tablet Take 1 tablet by mouth daily.   09/02/2015 at 0800   No current facility-administered medications for this visit.    Allergies  Allergen Reactions  . Pineapple Shortness Of Breath and Swelling    Swelling of tongue   . Chocolate Hives  . Strawberry Extract Hives  . Wheat Bran Hives    Social History   Tobacco Use  . Smoking status: Never Smoker  . Smokeless tobacco: Never Used  Substance Use Topics  . Alcohol use: No    Family History  Problem Relation Age of Onset  . Hypertension Mother   . Asthma Mother   . Allergies Mother   . Heart disease Sister   . Hypertension Sister   . Stroke Sister   . Diabetes Sister      Review of Systems  Constitutional: Negative.   HENT: Negative.   Eyes: Negative.   Respiratory: Negative.   Cardiovascular: Negative.   Gastrointestinal: Negative.   Genitourinary: Negative.   Musculoskeletal: Positive for back pain and joint pain.  Skin: Negative.   Neurological: Negative.   Endo/Heme/Allergies: Negative.   Psychiatric/Behavioral: Negative.     Objective:  Physical Exam  Vitals reviewed. Constitutional: She is oriented to person, place, and time. She appears well-developed and well-nourished.  HENT:  Head: Normocephalic and atraumatic.  Eyes: Pupils are equal, round, and reactive to light.  Conjunctivae and EOM are normal.  Neck: Normal range of motion. Neck supple.  Cardiovascular: Normal rate, regular rhythm and intact distal pulses.  Respiratory: Effort normal. No respiratory distress.  GI: Soft. She exhibits no distension.  Genitourinary:    Genitourinary Comments: deferred   Neurological: She is alert and oriented to person, place, and time. She has normal reflexes.  Skin: Skin is warm and dry.  Psychiatric:  She has a normal mood and affect. Her behavior is normal. Judgment and thought content normal.    Vital signs in last 24 hours: @VSRANGES @  Labs:   Estimated body mass index is 41.6 kg/m as calculated from the following:   Height as of 09/02/15: 5\' 5"  (1.651 m).   Weight as of 09/02/15: 113.4 kg.   Imaging Review Plain radiographs demonstrate severe degenerative joint disease of the left hip(s). The bone quality appears to be adequate for age and reported activity level.      Assessment/Plan:  End stage arthritis, left hip(s)  The patient history, physical examination, clinical judgement of the provider and imaging studies are consistent with end stage degenerative joint disease of the left hip(s) and total hip arthroplasty is deemed medically necessary. The treatment options including medical management, injection therapy, arthroscopy and arthroplasty were discussed at length. The risks and benefits of total hip arthroplasty were presented and reviewed. The risks due to aseptic loosening, infection, stiffness, dislocation/subluxation,  thromboembolic complications and other imponderables were discussed.  The patient acknowledged the explanation, agreed to proceed with the plan and consent was signed. Patient is being admitted for inpatient treatment for surgery, pain control, PT, OT, prophylactic antibiotics, VTE prophylaxis, progressive ambulation and ADL's and discharge planning.The patient is planning to be discharged home with HEP    Patient's anticipated LOS is less than 2 midnights, meeting these requirements: - Younger than 23 - Lives within 1 hour of care - Has a competent adult at home to recover with post-op recover - NO history of  - Chronic pain requiring opiods  - Diabetes  - Coronary Artery Disease  - Heart failure  - Heart attack  - Stroke  - DVT/VTE  - Cardiac arrhythmia  - Respiratory Failure/COPD  - Renal failure  - Anemia  - Advanced Liver  disease

## 2018-04-16 NOTE — Patient Instructions (Addendum)
Darlene Crosby  November 21, 1965     Your procedure is scheduled on:  04-25-2018   Report to [facility] Main  Entrance,  Report to admitting at  5:30 AM    Call this number if you have problems the morning of surgery [phone]       Remember: Do not eat food or drink liquids :After Midnight. This includes no water, candy, gum, mints  BRUSH YOUR TEETH MORNING OF SURGERY AND RINSE YOUR MOUTH OUT        Take these medicines the morning of surgery with A SIP OF WATER:  Do Albuterol Nebulizer if needed,  ProAir inhaler    If needed and bring with you day of surgery                                  You may not have any metal on your body including hair pins and               piercings  Do not wear jewelry, make-up, lotions, powders or perfumes, deodorant              Do  not wear nail polish.  Do not shave  48 hours prior to surgery.                Do not bring valuables to the hospital. [place].  Contacts, dentures or bridgework may not be worn into surgery.  Leave suitcase in the car. After surgery it may be brought to your room.    _____________________________________________________________________            [facility] - Preparing for Surgery Before surgery, you can play an important role.  Because skin is not sterile, your skin needs to be as free of germs as possible.  You can reduce the number of germs on your skin by washing with CHG (chlorahexidine gluconate) soap before surgery.  CHG is an antiseptic cleaner which kills germs and bonds with the skin to continue killing germs even after washing. Please DO NOT use if you have an allergy to CHG or antibacterial soaps.  If your skin becomes reddened/irritated stop using the CHG and inform your nurse when you arrive at Short Stay. Do not shave (including legs and underarms) for at least 48 hours prior to the first CHG shower.  You may  shave your face/neck. Please follow these instructions carefully:  1.  Shower with CHG Soap the night before surgery and the  morning of Surgery.  2.  If you choose to wash your hair, wash your hair first as usual with your  normal  shampoo.  3.  After you shampoo, rinse your hair and body thoroughly to remove the  shampoo.                            4.  Use CHG as you would any other liquid soap.  You can apply chg directly  to the skin and wash                       Gently with a scrungie or clean washcloth.  5.  Apply the CHG  Soap to your body ONLY FROM THE NECK DOWN.   Do not use on face/ open                           Wound or open sores. Avoid contact with eyes, ears mouth and genitals (private parts).                       Wash face,  Genitals (private parts) with your normal soap.             6.  Wash thoroughly, paying special attention to the area where your surgery  will be performed.  7.  Thoroughly rinse your body with warm water from the neck down.  8.  DO NOT shower/wash with your normal soap after using and rinsing off  the CHG Soap.             9.  Pat yourself dry with a clean towel.            10.  Wear clean pajamas.            11.  Place clean sheets on your bed the night of your first shower and do not  sleep with pets. Day of Surgery : Do not apply any lotions/deodorants the morning of surgery.  Please wear clean clothes to the hospital/surgery center.  FAILURE TO FOLLOW THESE INSTRUCTIONS MAY RESULT IN THE CANCELLATION OF YOUR SURGERY PATIENT SIGNATURE_________________________________  NURSE SIGNATURE__________________________________  ________________________________________________________________________   Adam Phenix  An incentive spirometer is a tool that can help keep your lungs clear and active. This tool measures how well you are filling your lungs with each breath. Taking long deep breaths may help reverse or decrease the chance of developing  breathing (pulmonary) problems (especially infection) following:  A long period of time when you are unable to move or be active. BEFORE THE PROCEDURE   If the spirometer includes an indicator to show your best effort, your nurse or respiratory therapist will set it to a desired goal.  If possible, sit up straight or lean slightly forward. Try not to slouch.  Hold the incentive spirometer in an upright position. INSTRUCTIONS FOR USE  1. Sit on the edge of your bed if possible, or sit up as far as you can in bed or on a chair. 2. Hold the incentive spirometer in an upright position. 3. Breathe out normally. 4. Place the mouthpiece in your mouth and seal your lips tightly around it. 5. Breathe in slowly and as deeply as possible, raising the piston or the ball toward the top of the column. 6. Hold your breath for 3-5 seconds or for as long as possible. Allow the piston or ball to fall to the bottom of the column. 7. Remove the mouthpiece from your mouth and breathe out normally. 8. Rest for a few seconds and repeat Steps 1 through 7 at least 10 times every 1-2 hours when you are awake. Take your time and take a few normal breaths between deep breaths. 9. The spirometer may include an indicator to show your best effort. Use the indicator as a goal to work toward during each repetition. 10. After each set of 10 deep breaths, practice coughing to be sure your lungs are clear. If you have an incision (the cut made at the time of surgery), support your incision when coughing by placing a pillow or rolled up towels firmly against it.  Once you are able to get out of bed, walk around indoors and cough well. You may stop using the incentive spirometer when instructed by your caregiver.  RISKS AND COMPLICATIONS  Take your time so you do not get dizzy or light-headed.  If you are in pain, you may need to take or ask for pain medication before doing incentive spirometry. It is harder to take a deep  breath if you are having pain. AFTER USE  Rest and breathe slowly and easily.  It can be helpful to keep track of a log of your progress. Your caregiver can provide you with a simple table to help with this. If you are using the spirometer at home, follow these instructions: [place] IF:   You are having difficultly using the spirometer.  You have trouble using the spirometer as often as instructed.  Your pain medication is not giving enough relief while using the spirometer.  You develop fever of 100.5 F (38.1 C) or higher. SEEK IMMEDIATE MEDICAL CARE IF:   You cough up bloody sputum that had not been present before.  You develop fever of 102 F (38.9 C) or greater.  You develop worsening pain at or near the incision site. MAKE SURE YOU:   Understand these instructions.  Will watch your condition.  Will get help right away if you are not doing well or get worse. Document Released: 06/27/2006 Document Revised: 05/09/2011 Document Reviewed: 08/28/2006 ExitCare Patient Information 2014 ExitCare, [place].   ________________________________________________________________________  WHAT IS A BLOOD TRANSFUSION? Blood Transfusion Information  A transfusion is the replacement of blood or some of its parts. Blood is made up of multiple cells which provide different functions.  Red blood cells carry oxygen and are used for blood loss replacement.  White blood cells fight against infection.  Platelets control bleeding.  Plasma helps clot blood.  Other blood products are available for specialized needs, such as hemophilia or other clotting disorders. BEFORE THE TRANSFUSION  Who gives blood for transfusions?   Healthy volunteers who are fully evaluated to make sure their blood is safe. This is blood bank blood. Transfusion therapy is the safest it has ever been in the practice of medicine. Before blood is taken from a donor, a complete history is taken to make sure  that person has no history of diseases nor engages in risky social behavior (examples are intravenous drug use or sexual activity with multiple partners). The donor's travel history is screened to minimize risk of transmitting infections, such as malaria. The donated blood is tested for signs of infectious diseases, such as HIV and hepatitis. The blood is then tested to be sure it is compatible with you in order to minimize the chance of a transfusion reaction. If you or a relative donates blood, this is often done in anticipation of surgery and is not appropriate for emergency situations. It takes many days to process the donated blood. RISKS AND COMPLICATIONS Although transfusion therapy is very safe and saves many lives, the main dangers of transfusion include:   Getting an infectious disease.  Developing a transfusion reaction. This is an allergic reaction to something in the blood you were given. Every precaution is taken to prevent this. The decision to have a blood transfusion has been considered carefully by your caregiver before blood is given. Blood is not given unless the benefits outweigh the risks. AFTER THE TRANSFUSION  Right after receiving a blood transfusion, you will usually feel much better and more energetic.  This is especially true if your red blood cells have gotten low (anemic). The transfusion raises the level of the red blood cells which carry oxygen, and this usually causes an energy increase.  The nurse administering the transfusion will monitor you carefully for complications. HOME CARE INSTRUCTIONS  No special instructions are needed after a transfusion. You may find your energy is better. Speak with your caregiver about any limitations on activity for underlying diseases you may have. SEEK MEDICAL CARE IF:   Your condition is not improving after your transfusion.  You develop redness or irritation at the intravenous (IV) site. SEEK IMMEDIATE MEDICAL CARE IF:  Any of  the following symptoms occur over the next 12 hours:  Shaking chills.  You have a temperature by mouth above 102 F (38.9 C), not controlled by medicine.  Chest, back, or muscle pain.  People around you feel you are not acting correctly or are confused.  Shortness of breath or difficulty breathing.  Dizziness and fainting.  You get a rash or develop hives.  You have a decrease in urine output.  Your urine turns a dark color or changes to pink, red, or brown. Any of the following symptoms occur over the next 10 days:  You have a temperature by mouth above 102 F (38.9 C), not controlled by medicine.  Shortness of breath.  Weakness after normal activity.  The white part of the eye turns yellow (jaundice).  You have a decrease in the amount of urine or are urinating less often.  Your urine turns a dark color or changes to pink, red, or brown. Document Released: 02/12/2000 Document Revised: 05/09/2011 Document Reviewed: 10/01/2007 [facility] Patient Information 2014 [place], [place].  _______________________________________________________________________

## 2018-04-18 ENCOUNTER — Other Ambulatory Visit: Payer: Self-pay

## 2018-04-18 ENCOUNTER — Encounter (INDEPENDENT_AMBULATORY_CARE_PROVIDER_SITE_OTHER): Payer: Self-pay

## 2018-04-18 ENCOUNTER — Encounter (HOSPITAL_COMMUNITY): Payer: Self-pay

## 2018-04-18 ENCOUNTER — Encounter (HOSPITAL_COMMUNITY)
Admission: RE | Admit: 2018-04-18 | Discharge: 2018-04-18 | Disposition: A | Discharge status: Home / Self Care | Payer: BLUE CROSS/BLUE SHIELD | Source: Ambulatory Visit | Attending: Orthopedic Surgery | Admitting: Orthopedic Surgery

## 2018-04-18 DIAGNOSIS — Z01812 Encounter for preprocedural laboratory examination: Secondary | ICD-10-CM | POA: Diagnosis not present

## 2018-04-18 DIAGNOSIS — M1612 Unilateral primary osteoarthritis, left hip: Secondary | ICD-10-CM | POA: Insufficient documentation

## 2018-04-18 HISTORY — DX: Unspecified osteoarthritis, unspecified site: M19.90

## 2018-04-18 HISTORY — DX: Other seasonal allergic rhinitis: J30.2

## 2018-04-18 HISTORY — DX: Presence of spectacles and contact lenses: Z97.3

## 2018-04-18 LAB — BASIC METABOLIC PANEL
Anion gap: 7 (ref 5–15)
BUN: 13 mg/dL (ref 6–20)
CO2: 29 mmol/L (ref 22–32)
Calcium: 9.3 mg/dL (ref 8.9–10.3)
Chloride: 102 mmol/L (ref 98–111)
Creatinine, Ser: 0.89 mg/dL (ref 0.44–1.00)
GFR calc Af Amer: >60 mL/min (ref >60)
Glucose, Bld: 105 mg/dL — ABNORMAL HIGH (ref 70–99)
Potassium: 3.2 mmol/L — ABNORMAL LOW (ref 3.5–5.1)
Sodium: 138 mmol/L (ref 135–145)

## 2018-04-18 LAB — CBC
HCT: 43.1 % (ref 36.0–46.0)
Hemoglobin: 13.9 g/dL (ref 12.0–15.0)
MCH: 28 pg (ref 26.0–34.0)
MCHC: 32.3 g/dL (ref 30.0–36.0)
MCV: 86.9 fL (ref 80.0–100.0)
Platelets: 359 10*3/uL (ref 150–400)
RBC: 4.96 MIL/uL (ref 3.87–5.11)
RDW: 12.7 % (ref 11.5–15.5)
WBC: 8.3 10*3/uL (ref 4.0–10.5)
nRBC: 0 % (ref 0.0–0.2)

## 2018-04-18 NOTE — Progress Notes (Signed)
Pt pcp, dr Lisbeth Ply, surgical clearance dated 04-04-2018 in chart.  Also, with chart EKG dated 04-02-2018 from pcp in chart.

## 2018-04-19 LAB — SURGICAL PCR SCREEN
MRSA, PCR: POSITIVE — AB
Staphylococcus aureus: POSITIVE — AB

## 2018-04-19 LAB — ABO/RH: ABO/RH(D): B POS

## 2018-04-24 MED ORDER — VANCOMYCIN HCL 10 G IV SOLR
1500.0000 mg | Freq: Once | INTRAVENOUS | Status: AC
  Administered 2018-04-25: 1500 mg via INTRAVENOUS
  Filled 2018-04-24: qty 1500

## 2018-04-24 NOTE — Anesthesia Preprocedure Evaluation (Addendum)
Anesthesia Evaluation  Patient identified by MRN, date of birth, ID band Patient awake    Reviewed: Allergy & Precautions, H&P , NPO status , Patient's Chart, lab work & pertinent test results  Airway Mallampati: I  TM Distance: >3 FB     Dental  (+) Teeth Intact, Missing, Dental Advisory Given   Pulmonary shortness of breath, asthma ,    breath sounds clear to auscultation       Cardiovascular hypertension, Pt. on medications  Rhythm:Regular Rate:Normal     Neuro/Psych    GI/Hepatic   Endo/Other    Renal/GU      Musculoskeletal  (+) Arthritis ,   Abdominal (+) + obese,   Peds  Hematology   Anesthesia Other Findings   Reproductive/Obstetrics                             Anesthesia Physical  Anesthesia Plan  ASA: II  Anesthesia Plan: Spinal   Post-op Pain Management:    Induction: Intravenous  PONV Risk Score and Plan: 3 and Ondansetron, Dexamethasone, Midazolam, Propofol infusion and Treatment may vary due to age or medical condition  Airway Management Planned: Natural Airway  Additional Equipment:   Intra-op Plan:   Post-operative Plan:   Informed Consent: I have reviewed the patients History and Physical, chart, labs and discussed the procedure including the risks, benefits and alternatives for the proposed anesthesia with the patient or authorized representative who has indicated his/her understanding and acceptance.     Dental advisory given  Plan Discussed with: CRNA  Anesthesia Plan Comments:         Anesthesia Quick Evaluation

## 2018-04-25 ENCOUNTER — Encounter (HOSPITAL_COMMUNITY): Payer: Self-pay | Admitting: Emergency Medicine

## 2018-04-25 ENCOUNTER — Observation Stay (HOSPITAL_COMMUNITY): Payer: BLUE CROSS/BLUE SHIELD

## 2018-04-25 ENCOUNTER — Inpatient Hospital Stay (HOSPITAL_COMMUNITY): Payer: BLUE CROSS/BLUE SHIELD

## 2018-04-25 ENCOUNTER — Inpatient Hospital Stay (HOSPITAL_COMMUNITY): Payer: BLUE CROSS/BLUE SHIELD | Admitting: Physician Assistant

## 2018-04-25 ENCOUNTER — Inpatient Hospital Stay (HOSPITAL_COMMUNITY)
Admission: RE | Admit: 2018-04-25 | Discharge: 2018-04-26 | DRG: 470 | Disposition: A | Discharge status: Home / Self Care | Payer: BLUE CROSS/BLUE SHIELD | Attending: Orthopedic Surgery | Admitting: Orthopedic Surgery

## 2018-04-25 ENCOUNTER — Other Ambulatory Visit: Payer: Self-pay

## 2018-04-25 ENCOUNTER — Inpatient Hospital Stay (HOSPITAL_COMMUNITY): Payer: BLUE CROSS/BLUE SHIELD | Admitting: Anesthesiology

## 2018-04-25 ENCOUNTER — Encounter (HOSPITAL_COMMUNITY)
Admission: RE | Disposition: A | Discharge status: Home / Self Care | Payer: Self-pay | Source: Home / Self Care | Attending: Orthopedic Surgery

## 2018-04-25 DIAGNOSIS — Z79899 Other long term (current) drug therapy: Secondary | ICD-10-CM

## 2018-04-25 DIAGNOSIS — I10 Essential (primary) hypertension: Secondary | ICD-10-CM | POA: Diagnosis present

## 2018-04-25 DIAGNOSIS — Z419 Encounter for procedure for purposes other than remedying health state, unspecified: Secondary | ICD-10-CM

## 2018-04-25 DIAGNOSIS — E669 Obesity, unspecified: Secondary | ICD-10-CM | POA: Diagnosis present

## 2018-04-25 DIAGNOSIS — M25552 Pain in left hip: Secondary | ICD-10-CM | POA: Diagnosis present

## 2018-04-25 DIAGNOSIS — Z6841 Body Mass Index (BMI) 40.0 and over, adult: Secondary | ICD-10-CM

## 2018-04-25 DIAGNOSIS — Z791 Long term (current) use of non-steroidal anti-inflammatories (NSAID): Secondary | ICD-10-CM | POA: Diagnosis not present

## 2018-04-25 DIAGNOSIS — M1612 Unilateral primary osteoarthritis, left hip: Secondary | ICD-10-CM | POA: Diagnosis present

## 2018-04-25 DIAGNOSIS — Z09 Encounter for follow-up examination after completed treatment for conditions other than malignant neoplasm: Secondary | ICD-10-CM

## 2018-04-25 HISTORY — PX: TOTAL HIP ARTHROPLASTY: SHX124

## 2018-04-25 LAB — TYPE AND SCREEN
ABO/RH(D): B POS
Antibody Screen: NEGATIVE

## 2018-04-25 SURGERY — ARTHROPLASTY, HIP, TOTAL, ANTERIOR APPROACH
Anesthesia: Spinal | Site: Hip | Laterality: Left

## 2018-04-25 MED ORDER — PROMETHAZINE HCL 25 MG/ML IJ SOLN: 6.2500 mg | INTRAMUSCULAR | Status: DC | PRN

## 2018-04-25 MED ORDER — KETOROLAC TROMETHAMINE 30 MG/ML IJ SOLN
INTRAMUSCULAR | Status: DC | PRN
  Administered 2018-04-25: 30 mg

## 2018-04-25 MED ORDER — ASPIRIN 81 MG PO CHEW
81.0000 mg | CHEWABLE_TABLET | Freq: Two times a day (BID) | ORAL | Status: DC
  Administered 2018-04-25 – 2018-04-26 (×2): 81 mg via ORAL
  Filled 2018-04-25 (×2): qty 1

## 2018-04-25 MED ORDER — DEXAMETHASONE SODIUM PHOSPHATE 10 MG/ML IJ SOLN
INTRAMUSCULAR | Status: AC
  Filled 2018-04-25: qty 1

## 2018-04-25 MED ORDER — SODIUM CHLORIDE 0.9 % IV SOLN: INTRAVENOUS | Status: DC

## 2018-04-25 MED ORDER — BUPIVACAINE IN DEXTROSE 0.75-8.25 % IT SOLN
INTRATHECAL | Status: DC | PRN
  Administered 2018-04-25: 1.6 mL via INTRATHECAL

## 2018-04-25 MED ORDER — WATER FOR IRRIGATION, STERILE IR SOLN
Status: DC | PRN
  Administered 2018-04-25: 2000 mL

## 2018-04-25 MED ORDER — ONDANSETRON HCL 4 MG PO TABS: 4.0000 mg | ORAL_TABLET | Freq: Four times a day (QID) | ORAL | Status: DC | PRN

## 2018-04-25 MED ORDER — METOCLOPRAMIDE HCL 5 MG/ML IJ SOLN: 5.0000 mg | Freq: Three times a day (TID) | INTRAMUSCULAR | Status: DC | PRN

## 2018-04-25 MED ORDER — METHOCARBAMOL 500 MG IVPB - SIMPLE MED
INTRAVENOUS | Status: AC
  Filled 2018-04-25: qty 50

## 2018-04-25 MED ORDER — HYDROCODONE-ACETAMINOPHEN 5-325 MG PO TABS
1.0000 | ORAL_TABLET | ORAL | Status: DC | PRN
  Administered 2018-04-25 – 2018-04-26 (×4): 2 via ORAL
  Filled 2018-04-25 (×5): qty 2

## 2018-04-25 MED ORDER — VANCOMYCIN HCL IN DEXTROSE 1-5 GM/200ML-% IV SOLN
1000.0000 mg | Freq: Two times a day (BID) | INTRAVENOUS | Status: AC
  Administered 2018-04-25: 1000 mg via INTRAVENOUS
  Filled 2018-04-25: qty 200

## 2018-04-25 MED ORDER — SENNA 8.6 MG PO TABS
1.0000 | ORAL_TABLET | Freq: Two times a day (BID) | ORAL | Status: DC
  Administered 2018-04-26: 8.6 mg via ORAL
  Filled 2018-04-25 (×2): qty 1

## 2018-04-25 MED ORDER — ISOPROPYL ALCOHOL 70 % SOLN
Status: DC | PRN
  Administered 2018-04-25: 1 via TOPICAL

## 2018-04-25 MED ORDER — MEPERIDINE HCL 50 MG/ML IJ SOLN: 6.2500 mg | INTRAMUSCULAR | Status: DC | PRN

## 2018-04-25 MED ORDER — SODIUM CHLORIDE (PF) 0.9 % IJ SOLN
INTRAMUSCULAR | Status: DC | PRN
  Administered 2018-04-25: 30 mL
  Administered 2018-04-25: 1000 mL

## 2018-04-25 MED ORDER — BUPIVACAINE HCL (PF) 0.25 % IJ SOLN
INTRAMUSCULAR | Status: AC
  Filled 2018-04-25: qty 30

## 2018-04-25 MED ORDER — MENTHOL 3 MG MT LOZG: 1.0000 | LOZENGE | OROMUCOSAL | Status: DC | PRN

## 2018-04-25 MED ORDER — ISOPROPYL ALCOHOL 70 % SOLN
Status: AC
  Filled 2018-04-25: qty 480

## 2018-04-25 MED ORDER — LACTATED RINGERS IV SOLN
INTRAVENOUS | Status: DC
  Administered 2018-04-25 (×2): via INTRAVENOUS

## 2018-04-25 MED ORDER — ALUM & MAG HYDROXIDE-SIMETH 200-200-20 MG/5ML PO SUSP: 30.0000 mL | ORAL | Status: DC | PRN

## 2018-04-25 MED ORDER — HYDROCODONE-ACETAMINOPHEN 7.5-325 MG PO TABS: 1.0000 | ORAL_TABLET | ORAL | Status: DC | PRN

## 2018-04-25 MED ORDER — ACETAMINOPHEN 325 MG PO TABS: 325.0000 mg | ORAL_TABLET | Freq: Four times a day (QID) | ORAL | Status: DC | PRN

## 2018-04-25 MED ORDER — BUPIVACAINE-EPINEPHRINE (PF) 0.5% -1:200000 IJ SOLN
INTRAMUSCULAR | Status: AC
  Filled 2018-04-25: qty 30

## 2018-04-25 MED ORDER — ONDANSETRON HCL 4 MG/2ML IJ SOLN
INTRAMUSCULAR | Status: DC | PRN
  Administered 2018-04-25: 4 mg via INTRAVENOUS

## 2018-04-25 MED ORDER — HYDROMORPHONE HCL 1 MG/ML IJ SOLN: 0.2500 mg | INTRAMUSCULAR | Status: DC | PRN

## 2018-04-25 MED ORDER — CHLORHEXIDINE GLUCONATE 4 % EX LIQD: 60.0000 mL | Freq: Once | CUTANEOUS | Status: DC

## 2018-04-25 MED ORDER — METOCLOPRAMIDE HCL 5 MG PO TABS: 5.0000 mg | ORAL_TABLET | Freq: Three times a day (TID) | ORAL | Status: DC | PRN

## 2018-04-25 MED ORDER — SODIUM CHLORIDE (PF) 0.9 % IJ SOLN
INTRAMUSCULAR | Status: AC
  Filled 2018-04-25: qty 50

## 2018-04-25 MED ORDER — BUPIVACAINE HCL (PF) 0.25 % IJ SOLN
INTRAMUSCULAR | Status: DC | PRN
  Administered 2018-04-25: 30 mL

## 2018-04-25 MED ORDER — HYDROCHLOROTHIAZIDE 12.5 MG PO CAPS
12.5000 mg | ORAL_CAPSULE | Freq: Every day | ORAL | Status: DC
  Administered 2018-04-26: 12.5 mg via ORAL
  Filled 2018-04-25: qty 1

## 2018-04-25 MED ORDER — ONDANSETRON HCL 4 MG/2ML IJ SOLN
INTRAMUSCULAR | Status: AC
  Filled 2018-04-25: qty 2

## 2018-04-25 MED ORDER — DEXAMETHASONE SODIUM PHOSPHATE 10 MG/ML IJ SOLN
INTRAMUSCULAR | Status: DC | PRN
  Administered 2018-04-25: 8 mg via INTRAVENOUS

## 2018-04-25 MED ORDER — PROPOFOL 500 MG/50ML IV EMUL
INTRAVENOUS | Status: DC | PRN
  Administered 2018-04-25: 40 mg via INTRAVENOUS

## 2018-04-25 MED ORDER — MIDAZOLAM HCL 5 MG/5ML IJ SOLN
INTRAMUSCULAR | Status: DC | PRN
  Administered 2018-04-25: 2 mg via INTRAVENOUS

## 2018-04-25 MED ORDER — FENTANYL CITRATE (PF) 100 MCG/2ML IJ SOLN
INTRAMUSCULAR | Status: DC | PRN
  Administered 2018-04-25: 50 ug via INTRAVENOUS

## 2018-04-25 MED ORDER — ALBUTEROL SULFATE (2.5 MG/3ML) 0.083% IN NEBU: 2.5000 mg | INHALATION_SOLUTION | RESPIRATORY_TRACT | Status: DC | PRN

## 2018-04-25 MED ORDER — LOSARTAN POTASSIUM 50 MG PO TABS
50.0000 mg | ORAL_TABLET | Freq: Every day | ORAL | Status: DC
  Administered 2018-04-26: 50 mg via ORAL
  Filled 2018-04-25: qty 1

## 2018-04-25 MED ORDER — METHOCARBAMOL 500 MG IVPB - SIMPLE MED
500.0000 mg | Freq: Four times a day (QID) | INTRAVENOUS | Status: DC | PRN
  Filled 2018-04-25: qty 50

## 2018-04-25 MED ORDER — CEFAZOLIN SODIUM-DEXTROSE 2-4 GM/100ML-% IV SOLN
2.0000 g | INTRAVENOUS | Status: AC
  Administered 2018-04-25: 2 g via INTRAVENOUS
  Filled 2018-04-25: qty 100

## 2018-04-25 MED ORDER — MORPHINE SULFATE (PF) 2 MG/ML IV SOLN
0.5000 mg | INTRAVENOUS | Status: DC | PRN
  Administered 2018-04-25: 1 mg via INTRAVENOUS
  Filled 2018-04-25: qty 1

## 2018-04-25 MED ORDER — POLYETHYLENE GLYCOL 3350 17 G PO PACK: 17.0000 g | PACK | Freq: Every day | ORAL | Status: DC | PRN

## 2018-04-25 MED ORDER — DEXAMETHASONE SODIUM PHOSPHATE 10 MG/ML IJ SOLN
10.0000 mg | Freq: Once | INTRAMUSCULAR | Status: AC
  Administered 2018-04-26: 10 mg via INTRAVENOUS
  Filled 2018-04-25: qty 1

## 2018-04-25 MED ORDER — KETOROLAC TROMETHAMINE 15 MG/ML IJ SOLN
15.0000 mg | Freq: Four times a day (QID) | INTRAMUSCULAR | Status: AC
  Administered 2018-04-25 – 2018-04-26 (×4): 15 mg via INTRAVENOUS
  Filled 2018-04-25 (×4): qty 1

## 2018-04-25 MED ORDER — SODIUM CHLORIDE 0.9 % IV SOLN
INTRAVENOUS | Status: DC | PRN
  Administered 2018-04-25: 25 ug/min via INTRAVENOUS

## 2018-04-25 MED ORDER — MIDAZOLAM HCL 2 MG/2ML IJ SOLN
INTRAMUSCULAR | Status: AC
  Filled 2018-04-25: qty 2

## 2018-04-25 MED ORDER — DOCUSATE SODIUM 100 MG PO CAPS
100.0000 mg | ORAL_CAPSULE | Freq: Two times a day (BID) | ORAL | Status: DC
  Administered 2018-04-25 – 2018-04-26 (×2): 100 mg via ORAL
  Filled 2018-04-25 (×2): qty 1

## 2018-04-25 MED ORDER — KETOROLAC TROMETHAMINE 30 MG/ML IJ SOLN
INTRAMUSCULAR | Status: AC
  Filled 2018-04-25: qty 1

## 2018-04-25 MED ORDER — SODIUM CHLORIDE 0.9 % IR SOLN
Status: DC | PRN
  Administered 2018-04-25: 3000 mL

## 2018-04-25 MED ORDER — LORATADINE 10 MG PO TABS
10.0000 mg | ORAL_TABLET | Freq: Every evening | ORAL | Status: DC
  Administered 2018-04-25: 10 mg via ORAL
  Filled 2018-04-25: qty 1

## 2018-04-25 MED ORDER — LOSARTAN POTASSIUM-HCTZ 50-12.5 MG PO TABS: 1.0000 | ORAL_TABLET | Freq: Every day | ORAL | Status: DC

## 2018-04-25 MED ORDER — POVIDONE-IODINE 10 % EX SWAB
2.0000 "application " | Freq: Once | CUTANEOUS | Status: AC
  Administered 2018-04-25: 2 via TOPICAL

## 2018-04-25 MED ORDER — PHENOL 1.4 % MT LIQD: 1.0000 | OROMUCOSAL | Status: DC | PRN

## 2018-04-25 MED ORDER — METHOCARBAMOL 500 MG PO TABS
500.0000 mg | ORAL_TABLET | Freq: Four times a day (QID) | ORAL | Status: DC | PRN
  Administered 2018-04-25: 500 mg via ORAL
  Filled 2018-04-25: qty 1

## 2018-04-25 MED ORDER — STERILE WATER FOR IRRIGATION IR SOLN: Status: DC | PRN

## 2018-04-25 MED ORDER — PROPOFOL 500 MG/50ML IV EMUL
INTRAVENOUS | Status: DC | PRN
  Administered 2018-04-25: 50 ug/kg/min via INTRAVENOUS

## 2018-04-25 MED ORDER — ROSUVASTATIN CALCIUM 10 MG PO TABS
10.0000 mg | ORAL_TABLET | Freq: Every day | ORAL | Status: DC
  Administered 2018-04-25 – 2018-04-26 (×2): 10 mg via ORAL
  Filled 2018-04-25 (×2): qty 1

## 2018-04-25 MED ORDER — LEVOCETIRIZINE DIHYDROCHLORIDE 5 MG PO TABS: 5.0000 mg | ORAL_TABLET | Freq: Every evening | ORAL | Status: DC

## 2018-04-25 MED ORDER — PHENYLEPHRINE HCL 10 MG/ML IJ SOLN
INTRAMUSCULAR | Status: AC
  Filled 2018-04-25: qty 1

## 2018-04-25 MED ORDER — PROPOFOL 500 MG/50ML IV EMUL: INTRAVENOUS | Status: DC | PRN

## 2018-04-25 MED ORDER — SODIUM CHLORIDE 0.9 % IV SOLN
INTRAVENOUS | Status: DC
  Administered 2018-04-25 (×2): via INTRAVENOUS

## 2018-04-25 MED ORDER — ONDANSETRON HCL 4 MG/2ML IJ SOLN: 4.0000 mg | Freq: Four times a day (QID) | INTRAMUSCULAR | Status: DC | PRN

## 2018-04-25 MED ORDER — FENTANYL CITRATE (PF) 100 MCG/2ML IJ SOLN
INTRAMUSCULAR | Status: AC
  Filled 2018-04-25: qty 2

## 2018-04-25 MED ORDER — DIPHENHYDRAMINE HCL 12.5 MG/5ML PO ELIX: 12.5000 mg | ORAL_SOLUTION | ORAL | Status: DC | PRN

## 2018-04-25 MED ORDER — ACETAMINOPHEN 10 MG/ML IV SOLN
1000.0000 mg | INTRAVENOUS | Status: AC
  Administered 2018-04-25: 1000 mg via INTRAVENOUS
  Filled 2018-04-25: qty 100

## 2018-04-25 MED ORDER — TRANEXAMIC ACID-NACL 1000-0.7 MG/100ML-% IV SOLN
1000.0000 mg | INTRAVENOUS | Status: AC
  Administered 2018-04-25: 1000 mg via INTRAVENOUS
  Filled 2018-04-25: qty 100

## 2018-04-25 SURGICAL SUPPLY — 58 items
ADH SKN CLS APL DERMABOND .7 (GAUZE/BANDAGES/DRESSINGS) ×1
BAG DECANTER FOR FLEXI CONT (MISCELLANEOUS) IMPLANT
BAG SPEC THK2 15X12 ZIP CLS (MISCELLANEOUS)
BAG ZIPLOCK 12X15 (MISCELLANEOUS) IMPLANT
BLADE SURG SZ10 CARB STEEL (BLADE) ×6 IMPLANT
CHLORAPREP W/TINT 26ML (MISCELLANEOUS) ×3 IMPLANT
CLOTH BEACON ORANGE TIMEOUT ST (SAFETY) ×3 IMPLANT
COVER PERINEAL POST (MISCELLANEOUS) ×3 IMPLANT
COVER SURGICAL LIGHT HANDLE (MISCELLANEOUS) ×3 IMPLANT
COVER WAND RF STERILE (DRAPES) IMPLANT
CUP SECTOR GRIPTON 50MM (Cup) ×2 IMPLANT
DECANTER SPIKE VIAL GLASS SM (MISCELLANEOUS) ×3 IMPLANT
DERMABOND ADVANCED (GAUZE/BANDAGES/DRESSINGS) ×2
DERMABOND ADVANCED .7 DNX12 (GAUZE/BANDAGES/DRESSINGS) ×2 IMPLANT
DRAPE IMP U-DRAPE 54X76 (DRAPES) ×3 IMPLANT
DRAPE SHEET LG 3/4 BI-LAMINATE (DRAPES) ×9 IMPLANT
DRAPE STERI IOBAN 125X83 (DRAPES) ×3 IMPLANT
DRAPE U-SHAPE 47X51 STRL (DRAPES) ×6 IMPLANT
DRSG AQUACEL AG ADV 3.5X10 (GAUZE/BANDAGES/DRESSINGS) ×3 IMPLANT
ELECT PENCIL ROCKER SW 15FT (MISCELLANEOUS) ×3 IMPLANT
ELECT REM PT RETURN 15FT ADLT (MISCELLANEOUS) ×3 IMPLANT
GAUZE SPONGE 4X4 12PLY STRL (GAUZE/BANDAGES/DRESSINGS) ×3 IMPLANT
GLOVE BIO SURGEON STRL SZ8.5 (GLOVE) ×6 IMPLANT
GLOVE BIOGEL PI IND STRL 8.5 (GLOVE) ×1 IMPLANT
GLOVE BIOGEL PI INDICATOR 8.5 (GLOVE) ×2
GOWN SPEC L3 XXLG W/TWL (GOWN DISPOSABLE) ×3 IMPLANT
HANDPIECE INTERPULSE COAX TIP (DISPOSABLE) ×3
HEAD FEMORAL 32 CERAMIC (Hips) ×2 IMPLANT
HOLDER FOLEY CATH W/STRAP (MISCELLANEOUS) ×3 IMPLANT
HOOD PEEL AWAY FLYTE STAYCOOL (MISCELLANEOUS) ×12 IMPLANT
LINER ACETABULAR 32X50 (Liner) ×2 IMPLANT
MANIFOLD NEPTUNE II (INSTRUMENTS) ×3 IMPLANT
MARKER SKIN DUAL TIP RULER LAB (MISCELLANEOUS) ×3 IMPLANT
NDL SAFETY ECLIPSE 18X1.5 (NEEDLE) ×1 IMPLANT
NDL SPNL 18GX3.5 QUINCKE PK (NEEDLE) ×1 IMPLANT
NEEDLE HYPO 18GX1.5 SHARP (NEEDLE) ×3
NEEDLE SPNL 18GX3.5 QUINCKE PK (NEEDLE) ×3 IMPLANT
PACK ANTERIOR HIP CUSTOM (KITS) ×3 IMPLANT
SAW OSC TIP CART 19.5X105X1.3 (SAW) ×3 IMPLANT
SEALER BIPOLAR AQUA 6.0 (INSTRUMENTS) ×3 IMPLANT
SET HNDPC FAN SPRY TIP SCT (DISPOSABLE) ×1 IMPLANT
STEM TRI LOC BPS GRIP SZ0 OFFS IMPLANT
SUT ETHIBOND NAB CT1 #1 30IN (SUTURE) ×6 IMPLANT
SUT MNCRL AB 3-0 PS2 18 (SUTURE) ×3 IMPLANT
SUT MNCRL AB 4-0 PS2 18 (SUTURE) ×3 IMPLANT
SUT MON AB 2-0 CT1 36 (SUTURE) ×6 IMPLANT
SUT STRATAFIX PDO 1 14 VIOLET (SUTURE) ×3
SUT STRATFX PDO 1 14 VIOLET (SUTURE) ×1
SUT VIC AB 2-0 CT1 27 (SUTURE) ×3
SUT VIC AB 2-0 CT1 TAPERPNT 27 (SUTURE) ×1 IMPLANT
SUTURE STRATFX PDO 1 14 VIOLET (SUTURE) ×1 IMPLANT
SYR 3ML LL SCALE MARK (SYRINGE) ×6 IMPLANT
SYR 50ML LL SCALE MARK (SYRINGE) ×3 IMPLANT
TRAY FOLEY BAG SILVER LF 14FR (CATHETERS) ×2 IMPLANT
TRAY FOLEY MTR SLVR 16FR STAT (SET/KITS/TRAYS/PACK) IMPLANT
TRI LOC BPS W/GRIP SZ0 OFFS HI ×3 IMPLANT
WATER STERILE IRR 1000ML POUR (IV SOLUTION) ×3 IMPLANT
YANKAUER SUCT BULB TIP 10FT TU (MISCELLANEOUS) ×3 IMPLANT

## 2018-04-25 NOTE — Transfer of Care (Signed)
Immediate Anesthesia Transfer of Care Note  Patient: Darlene Crosby  Procedure(s) Performed: TOTAL HIP ARTHROPLASTY ANTERIOR APPROACH (Left Hip)  Patient Location: PACU  Anesthesia Type:MAC and Spinal  Level of Consciousness: awake, alert  and oriented  Airway & Oxygen Therapy: Patient Spontanous Breathing and Patient connected to face mask oxygen  Post-op Assessment: Report given to RN and Post -op Vital signs reviewed and stable  Post vital signs: Reviewed and stable  Last Vitals:  Vitals Value Taken Time  BP 104/67 04/25/2018 10:16 AM  Temp    Pulse 88 04/25/2018 10:18 AM  Resp 20 04/25/2018 10:18 AM  SpO2 98 % 04/25/2018 10:18 AM  Vitals shown include unvalidated device data.  Last Pain:  Vitals:   04/25/18 0556  TempSrc:   PainSc: 0-No pain      Patients Stated Pain Goal: 4 (24/81/85 9093)  Complications: No apparent anesthesia complications

## 2018-04-25 NOTE — Evaluation (Signed)
Physical Therapy Evaluation Patient Details Name: Darlene Crosby MRN: [id number] DOB: 01-05-1966 Today's Date: 04/25/2018   History of Present Illness  53 yo female s/p L DA-THA on 04/25/18. PMH includes dyspnea, asthma, HTN, pyogenic granuloma with excision of mass.   Clinical Impression   Pt presents with L hip pain, post-surgical L hip weakness, difficulty performing bed mobility, increased time and effort to perform mobility tasks, and decreased tolerance for activity due to L hip pain. Pt to benefit from acute PT to address deficits. Pt ambulated 35 ft with RW with min guard assist, verbal cuing provided for safety and form. Pt educated on ankle pumps (20/hour) to perform this afternoon/evening to increase circulation, to pt's tolerance and limited by pain. PT to progress mobility as tolerated, and will continue to follow acutely.        Follow Up Recommendations Follow surgeon's recommendation for DC plan and follow-up therapies;Supervision for mobility/OOB    Equipment Recommendations  3in1 (PT);Rolling walker with 5" wheels    Recommendations for Other Services       Precautions / Restrictions Precautions Precautions: Fall Restrictions Weight Bearing Restrictions: No LLE Weight Bearing: Weight bearing as tolerated      Mobility  Bed Mobility Overal bed mobility: Needs Assistance Bed Mobility: Supine to Sit     Supine to sit: Min assist;HOB elevated     General bed mobility comments: Min assist for LLE translation to EOB. Pt with increased time to scoot to EOB.   Transfers Overall transfer level: Needs assistance Equipment used: Rolling walker (2 wheeled) Transfers: Sit to/from Stand Sit to Stand: Min guard;From elevated surface         General transfer comment: Min guard for safety, verbal cuing for hand placement.   Ambulation/Gait Ambulation/Gait assistance: Min guard;+2 safety/equipment(chair follow) Gait Distance (Feet): 75 Feet Assistive device:  Rolling walker (2 wheeled) Gait Pattern/deviations: Step-to pattern;Decreased stance time - left;Decreased weight shift to left;Antalgic;Trunk flexed Gait velocity: decr    General Gait Details: Min guard for safety. Verbal cuing for placement inside RW, upright posture as pt with tendency towards hip flexion due to pain, sequencing.  Stairs            Wheelchair Mobility    Modified Rankin (Stroke Patients Only)       Balance Overall balance assessment: Mild deficits observed, not formally tested                                           Pertinent Vitals/Pain Pain Assessment: 0-10 Pain Score: 3  Pain Location: L hip  Pain Descriptors / Indicators: Sore Pain Intervention(s): Limited activity within patient's tolerance;Repositioned;Ice applied;Monitored during session;Premedicated before session    Home Living Family/patient expects to be discharged to:: Private residence Living Arrangements: Children(pt's 85 year old daughter works 3rd shift and goes to school during the day ) Available Help at Discharge: Family;Available PRN/intermittently Type of Home: House Home Access: Stairs to enter Entrance Stairs-Rails: None Entrance Stairs-Number of Steps: 1 Home Layout: One level Home Equipment: Cane - single point      Prior Function Level of Independence: Independent with assistive device(s)         Comments: used cane PRN, pt works for [employer]: Right    Extremity/Trunk Assessment   Upper Extremity Assessment Upper Extremity Assessment: Overall WFL for  tasks assessed    Lower Extremity Assessment Lower Extremity Assessment: Overall WFL for tasks assessed;LLE deficits/detail LLE Deficits / Details: suspected post-surgical weakness; able to perform ankle pumps, quad set, heel slides to 45* limited by pain and LE "heaviness"  LLE Sensation: WNL    Cervical / Trunk Assessment Cervical / Trunk  Assessment: Normal  Communication   Communication: No difficulties  Cognition Arousal/Alertness: Awake/alert Behavior During Therapy: WFL for tasks assessed/performed Overall Cognitive Status: Within Functional Limits for tasks assessed                                        General Comments      Exercises     Assessment/Plan    PT Assessment Patient needs continued PT services  PT Problem List Decreased strength;Pain;Decreased activity tolerance;Decreased knowledge of use of DME;Decreased balance;Decreased mobility       PT Treatment Interventions DME instruction;Therapeutic activities;Gait training;Therapeutic exercise;Patient/family education;Balance training;Stair training;Functional mobility training    PT Goals (Current goals can be found in the Care Plan section)  Acute Rehab PT Goals Patient Stated Goal: none stated  PT Goal Formulation: With patient Time For Goal Achievement: 05/02/18 Potential to Achieve Goals: Good    Frequency 7X/week   Barriers to discharge        Co-evaluation               AM-PAC PT "6 Clicks" Mobility  Outcome Measure Help needed turning from your back to your side while in a flat bed without using bedrails?: A Little Help needed moving from lying on your back to sitting on the side of a flat bed without using bedrails?: A Little Help needed moving to and from a bed to a chair (including a wheelchair)?: A Little Help needed standing up from a chair using your arms (e.g., wheelchair or bedside chair)?: A Little Help needed to walk in hospital room?: A Little Help needed climbing 3-5 steps with a railing? : A Little 6 Click Score: 18    End of Session Equipment Utilized During Treatment: Gait belt;Oxygen(o2 reapplied after session) Activity Tolerance: Patient tolerated treatment well Patient left: in chair;with chair alarm set;with call bell/phone within reach;with SCD's reapplied Nurse Communication: Mobility  status PT Visit Diagnosis: Other abnormalities of gait and mobility (R26.89);Difficulty in walking, not elsewhere classified (R26.2)    Time: 6387-5643 PT Time Calculation (min) (ACUTE ONLY): 20 min   Charges:   PT Evaluation $PT Eval Low Complexity: 1 Low         Elienai Gailey Conception Chancy, PT Acute Rehabilitation Services Pager [phone]  Office [phone]   Maritza Hosterman D Elonda Husky 04/25/2018, 4:40 PM

## 2018-04-25 NOTE — Interval H&P Note (Signed)
History and Physical Interval Note:  04/25/2018 7:18 AM  Darlene Crosby  has presented today for surgery, with the diagnosis of [place]  The various methods of treatment have been discussed with the patient and family. After consideration of risks, benefits and other options for treatment, the patient has consented to  Procedure(s): TOTAL HIP ARTHROPLASTY ANTERIOR APPROACH (Left) as a surgical intervention .  The patient's history has been reviewed, patient examined, no change in status, stable for surgery.  I have reviewed the patient's chart and labs.  Questions were answered to the patient's satisfaction.     Hilton Cork Etosha Wetherell

## 2018-04-25 NOTE — Anesthesia Procedure Notes (Signed)
Spinal  Patient location during procedure: OR Start time: 04/25/2018 7:35 AM End time: 04/25/2018 7:39 AM Staffing Anesthesiologist: Nolon Nations, MD Resident/CRNA: Glory Buff, CRNA Performed: resident/CRNA  Preanesthetic Checklist Completed: patient identified, site marked, surgical consent, pre-op evaluation, timeout performed, IV checked, risks and benefits discussed and monitors and equipment checked Spinal Block Patient position: sitting Prep: DuraPrep Patient monitoring: heart rate, continuous pulse ox and blood pressure Approach: midline Location: L2-3 Needle Needle type: Pencan  Needle gauge: 24 G Needle length: 9 cm Needle insertion depth: 6 cm Assessment Sensory level: T6 Additional Notes Kit expiration date checked and verified.  Sterile prep and drape, skin local with 1% lidocaine, stick x 1, - paraesthesia, - heme, + CSF pre and post injection, patient tolerated procedure well.

## 2018-04-25 NOTE — Discharge Instructions (Signed)
°Dr. Griselda Bramblett °Joint Replacement Specialist °[facility] °[address] °[place], [place] [address] °[phone] ° ° °TOTAL HIP REPLACEMENT POSTOPERATIVE DIRECTIONS ° ° ° °Hip Rehabilitation, Guidelines Following Surgery  ° °WEIGHT BEARING °Weight bearing as tolerated with assist device (walker, cane, etc) as directed, use it as long as suggested by your surgeon or therapist, typically at least 4-6 weeks. ° °The results of a hip operation are greatly improved after range of motion and muscle strengthening exercises. Follow all safety measures which are given to protect your hip. If any of these exercises cause increased pain or swelling in your joint, decrease the amount until you are comfortable again. Then slowly increase the exercises. Call your caregiver if you have problems or questions.  ° °HOME CARE INSTRUCTIONS  °Most of the following instructions are designed to prevent the dislocation of your new hip.  °Remove items at home which could result in a fall. This includes throw rugs or furniture in walking pathways.  °Continue medications as instructed at time of discharge. °· You may have some home medications which will be placed on hold until you complete the course of blood thinner medication. °· You may start showering once you are discharged home. Do not remove your dressing. °Do not put on socks or shoes without following the instructions of your caregivers.   °Sit on chairs with arms. Use the chair arms to help push yourself up when arising.  °Arrange for the use of a toilet seat elevator so you are not sitting low.  °· Walk with walker as instructed.  °You may resume a sexual relationship in one month or when given the OK by your caregiver.  °Use walker as long as suggested by your caregivers.  °You may put full weight on your legs and walk as much as is comfortable. °Avoid periods of inactivity such as sitting longer than an hour when not asleep. This helps prevent  blood clots.  °You may return to work once you are cleared by your surgeon.  °Do not drive a car for 6 weeks or until released by your surgeon.  °Do not drive while taking narcotics.  °Wear elastic stockings for two weeks following surgery during the day but you may remove then at night.  °Make sure you keep all of your appointments after your operation with all of your doctors and caregivers. You should call the office at the above phone number and make an appointment for approximately two weeks after the date of your surgery. °Please pick up a stool softener and laxative for home use as long as you are requiring pain medications. °· ICE to the affected hip every three hours for 30 minutes at a time and then as needed for pain and swelling. Continue to use ice on the hip for pain and swelling from surgery. You may notice swelling that will progress down to the foot and ankle.  This is normal after surgery.  Elevate the leg when you are not up walking on it.   °It is important for you to complete the blood thinner medication as prescribed by your doctor. °· Continue to use the breathing machine which will help keep your temperature down.  It is common for your temperature to cycle up and down following surgery, especially at night when you are not up moving around and exerting yourself.  The breathing machine keeps your lungs expanded and your temperature down. ° °RANGE OF MOTION AND STRENGTHENING EXERCISES  °These exercises are   designed to help you keep full movement of your hip joint. Follow your caregiver's or physical therapist's instructions. Perform all exercises about fifteen times, three times per day or as directed. Exercise both hips, even if you have had only one joint replacement. These exercises can be done on a training (exercise) mat, on the floor, on a table or on a bed. Use whatever works the best and is most comfortable for you. Use music or television while you are exercising so that the exercises  are a pleasant break in your day. This will make your life better with the exercises acting as a break in routine you can look forward to.  °Lying on your back, slowly slide your foot toward your buttocks, raising your knee up off the floor. Then slowly slide your foot back down until your leg is straight again.  °Lying on your back spread your legs as far apart as you can without causing discomfort.  °Lying on your side, raise your upper leg and foot straight up from the floor as far as is comfortable. Slowly lower the leg and repeat.  °Lying on your back, tighten up the muscle in the front of your thigh (quadriceps muscles). You can do this by keeping your leg straight and trying to raise your heel off the floor. This helps strengthen the largest muscle supporting your knee.  °Lying on your back, tighten up the muscles of your buttocks both with the legs straight and with the knee bent at a comfortable angle while keeping your heel on the floor.  ° °SKILLED REHAB INSTRUCTIONS: °If the patient is transferred to a skilled rehab facility following release from the hospital, a list of the current medications will be sent to the facility for the patient to continue.  When discharged from the skilled rehab facility, please have the facility set up the patient's Home Health Physical Therapy prior to being released. Also, the skilled facility will be responsible for providing the patient with their medications at time of release from the facility to include their pain medication and their blood thinner medication. If the patient is still at the rehab facility at time of the two week follow up appointment, the skilled rehab facility will also need to assist the patient in arranging follow up appointment in our office and any transportation needs. ° °MAKE SURE YOU:  °Understand these instructions.  °Will watch your condition.  °Will get help right away if you are not doing well or get worse. ° °Pick up stool softner and  laxative for home use following surgery while on pain medications. °Do not remove your dressing. °The dressing is waterproof--it is OK to take showers. °Continue to use ice for pain and swelling after surgery. °Do not use any lotions or creams on the incision until instructed by your surgeon. °Total Hip Protocol. ° ° °

## 2018-04-25 NOTE — Anesthesia Procedure Notes (Signed)
Date/Time: 04/25/2018 7:30 AM Performed by: Glory Buff, CRNA Oxygen Delivery Method: Simple face mask

## 2018-04-25 NOTE — Op Note (Signed)
OPERATIVE REPORT  SURGEON: Rod Can, MD   ASSISTANT: Nehemiah Massed, PA-C.  PREOPERATIVE DIAGNOSIS: Left hip arthritis.   POSTOPERATIVE DIAGNOSIS: Left hip arthritis.   PROCEDURE: Left total hip arthroplasty, anterior approach.   IMPLANTS: DePuy Tri Lock stem, size 0, hi offset. DePuy Pinnacle Cup, size 50 mm. DePuy Altrx liner, size 32 by 50 mm, neutral. DePuy Biolox ceramic head ball, size 32 + 1 mm.  ANESTHESIA:  Spinal  ESTIMATED BLOOD LOSS:-300 mL    ANTIBIOTICS: 1 g vancomycin.  DRAINS: None.  COMPLICATIONS: None.   CONDITION: PACU - hemodynamically stable.   BRIEF CLINICAL NOTE: Darlene Crosby is a 53 y.o. female with a long-standing history of Left hip arthritis. After failing conservative management, the patient was indicated for total hip arthroplasty. The risks, benefits, and alternatives to the procedure were explained, and the patient elected to proceed.  PROCEDURE IN DETAIL: Surgical site was marked by myself in the pre-op holding area. Once inside the operating room, spinal anesthesia was obtained, and a foley catheter was inserted. The patient was then positioned on the Hana table. All bony prominences were well padded. The hip was prepped and draped in the normal sterile surgical fashion. A time-out was called verifying side and site of surgery. The patient received IV antibiotics within 60 minutes of beginning the procedure.  The direct anterior approach to the hip was performed through the Hueter interval. Lateral femoral circumflex vessels were treated with the Auqumantys. The anterior capsule was exposed and an inverted T capsulotomy was made.The femoral neck cut was made to the level of the templated cut. A corkscrew was placed into the head and the head was removed. The femoral head was found to have eburnated bone. The head was passed to the back table and was measured.  Acetabular exposure was achieved, and the pulvinar and labrum  were excised. Sequential reaming of the acetabulum was then performed up to a size 49 mm reamer. A 50 mm cup was then opened and impacted into place at approximately 40 degrees of abduction and 20 degrees of anteversion. The final polyethylene liner was impacted into place and acetabular osteophytes were removed.   I then gained femoral exposure taking care to protect the abductors and greater trochanter. This was performed using standard external rotation, extension, and adduction. The capsule was peeled off the inner aspect of the greater trochanter, taking care to preserve the short external rotators. A cookie cutter was used to enter the femoral canal, and then the femoral canal finder was placed. Sequential broaching was performed up to a size 0. Calcar planer was used on the femoral neck remnant. I placed a hi offset neck and a trial head ball. The hip was reduced. Leg lengths and offset were checked fluoroscopically. The hip was dislocated and trial components were removed. The final implants were placed, and the hip was reduced.  Fluoroscopy was used to confirm component position and leg lengths. At 90 degrees of external rotation and full extension, the hip was stable to an anterior directed force.  The wound was copiously irrigated with normal saline using pulse lavage. Marcaine solution was injected into the periarticular soft tissue. The wound was closed in layers using #1 Vicryl and V-Loc for the fascia, 2-0 Vicryl for the subcutaneous fat, 2-0 Monocryl for the deep dermal layer, 3-0 running Monocryl subcuticular stitch, and Dermabond for the skin. Once the glue was fully dried, an Aquacell Ag dressing was applied. The patient was transported to the recovery room  in stable condition. Sponge, needle, and instrument counts were correct at the end of the case x2. The patient tolerated the procedure well and there were no known complications.  Please note that a surgical assistant  was a medical necessity for this procedure to perform it in a safe and expeditious manner. Assistant was necessary to provide appropriate retraction of vital neurovascular structures, to prevent femoral fracture, and to allow for anatomic placement of the prosthesis.

## 2018-04-25 NOTE — Anesthesia Postprocedure Evaluation (Signed)
Anesthesia Post Note  Patient: Darlene Crosby  Procedure(s) Performed: TOTAL HIP ARTHROPLASTY ANTERIOR APPROACH (Left Hip)     Patient location during evaluation: PACU Anesthesia Type: Spinal Level of consciousness: awake and alert Pain management: pain level controlled Vital Signs Assessment: post-procedure vital signs reviewed and stable Respiratory status: spontaneous breathing Cardiovascular status: stable Anesthetic complications: no    Last Vitals:  Vitals:   04/25/18 1145 04/25/18 1156  BP: 119/81 125/78  Pulse: 81   Resp: (!) 23 18  Temp:  (!) 36.4 C  SpO2: 100% 98%    Last Pain:  Vitals:   04/25/18 1145  TempSrc:   PainSc: 0-No pain                 Nolon Nations

## 2018-04-26 ENCOUNTER — Encounter (HOSPITAL_COMMUNITY): Payer: Self-pay | Admitting: Orthopedic Surgery

## 2018-04-26 LAB — CBC
HCT: 34.2 % — ABNORMAL LOW (ref 36.0–46.0)
Hemoglobin: 11.1 g/dL — ABNORMAL LOW (ref 12.0–15.0)
MCH: 28.8 pg (ref 26.0–34.0)
MCHC: 32.5 g/dL (ref 30.0–36.0)
MCV: 88.8 fL (ref 80.0–100.0)
Platelets: 256 10*3/uL (ref 150–400)
RBC: 3.85 MIL/uL — ABNORMAL LOW (ref 3.87–5.11)
RDW: 12.9 % (ref 11.5–15.5)
WBC: 14 10*3/uL — ABNORMAL HIGH (ref 4.0–10.5)
nRBC: 0 % (ref 0.0–0.2)

## 2018-04-26 LAB — BASIC METABOLIC PANEL
Anion gap: 5 (ref 5–15)
BUN: 11 mg/dL (ref 6–20)
CALCIUM: 8.3 mg/dL — AB (ref 8.9–10.3)
CO2: 23 mmol/L (ref 22–32)
CREATININE: 0.84 mg/dL (ref 0.44–1.00)
Chloride: 112 mmol/L — ABNORMAL HIGH (ref 98–111)
GFR calc non Af Amer: >60 mL/min (ref >60)
Glucose, Bld: 158 mg/dL — ABNORMAL HIGH (ref 70–99)
Potassium: 3.9 mmol/L (ref 3.5–5.1)
Sodium: 140 mmol/L (ref 135–145)

## 2018-04-26 MED ORDER — ASPIRIN 81 MG PO CHEW: 81.0000 mg | CHEWABLE_TABLET | Freq: Every day | ORAL | 1 refills | Status: DC

## 2018-04-26 MED ORDER — ONDANSETRON HCL 4 MG PO TABS: 4.0000 mg | ORAL_TABLET | Freq: Four times a day (QID) | ORAL | 0 refills | Status: DC | PRN

## 2018-04-26 MED ORDER — HYDROCODONE-ACETAMINOPHEN 5-325 MG PO TABS: 1.0000 | ORAL_TABLET | ORAL | 0 refills | Status: DC | PRN

## 2018-04-26 MED ORDER — DOCUSATE SODIUM 100 MG PO CAPS: 100.0000 mg | ORAL_CAPSULE | Freq: Two times a day (BID) | ORAL | 1 refills | Status: DC

## 2018-04-26 MED ORDER — SENNA 8.6 MG PO TABS: 1.0000 | ORAL_TABLET | Freq: Every day | ORAL | 1 refills | Status: DC

## 2018-04-26 NOTE — Discharge Summary (Signed)
Physician Discharge Summary  Patient ID: FIA HEBERT MRN: [id number] DOB/AGE: December 06, 1965 53 y.o.  Admit date: 04/25/2018 Discharge date: 04/26/2018  Admission Diagnoses:  Osteoarthritis of left hip  Discharge Diagnoses:  Principal Problem:   Osteoarthritis of left hip   Past Medical History:  Diagnosis Date  . Asthma    followed by pcp  . Hypertension   . OA (osteoarthritis)    left knee, right shoulder, left hip  . Seasonal allergies   . Wears glasses     Surgeries: Procedure(s): TOTAL HIP ARTHROPLASTY ANTERIOR APPROACH on 04/25/2018   Consultants (if any):   Discharged Condition: Improved  Hospital Course: Darlene Crosby is an 53 y.o. female who was admitted 04/25/2018 with a diagnosis of Osteoarthritis of left hip and went to the operating room on 04/25/2018 and underwent the above named procedures.    She was given perioperative antibiotics:  Anti-infectives (From admission, onward)   Start     Dose/Rate Route Frequency Ordered Stop   04/25/18 1800  vancomycin (VANCOCIN) IVPB 1000 mg/200 mL premix     1,000 mg 200 mL/hr over 60 Minutes Intravenous Every 12 hours 04/25/18 1200 04/25/18 1842   04/25/18 0600  vancomycin (VANCOCIN) 1,500 mg in sodium chloride 0.9 % 500 mL IVPB     1,500 mg 250 mL/hr over 120 Minutes Intravenous  Once 04/24/18 0732 04/25/18 0842   04/25/18 0600  ceFAZolin (ANCEF) IVPB 2g/100 mL premix     2 g 200 mL/hr over 30 Minutes Intravenous On call to O.R. 04/25/18 7829 04/25/18 0810    .  She was given sequential compression devices, early ambulation, and ASA for DVT prophylaxis.  She benefited maximally from the hospital stay and there were no complications.    Recent vital signs:  Vitals:   04/26/18 0609 04/26/18 0949  BP: 105/69 139/88  Pulse: 68 68  Resp: 17 16  Temp: 97.8 F (36.6 C) 97.8 F (36.6 C)  SpO2: 97% 97%    Recent laboratory studies:  Lab Results  Component Value Date   HGB 11.1 (L) 04/26/2018   HGB 13.9  04/18/2018   HGB 13.9 12/27/2014   Lab Results  Component Value Date   WBC 14.0 (H) 04/26/2018   PLT 256 04/26/2018   No results found for: INR Lab Results  Component Value Date   NA 140 04/26/2018   K 3.9 04/26/2018   CL 112 (H) 04/26/2018   CO2 23 04/26/2018   BUN 11 04/26/2018   CREATININE 0.84 04/26/2018   GLUCOSE 158 (H) 04/26/2018    Discharge Medications:   Allergies as of 04/26/2018      Reactions   Pineapple Shortness Of Breath, Swelling   Swelling of tongue    Chocolate Hives   Strawberry Extract Hives   Wheat Bran Hives   Coconut Oil Hives, Rash      Medication List    STOP taking these medications   acetaminophen 500 MG tablet Commonly known as:  TYLENOL   diclofenac 50 MG EC tablet Commonly known as:  VOLTAREN     TAKE these medications   albuterol (2.5 MG/3ML) 0.083% nebulizer solution Commonly known as:  PROVENTIL Inhale 2.5 mg into the lungs every 4 (four) hours as needed for wheezing or shortness of breath.   PROAIR HFA IN Inhale 2 puffs into the lungs every 4 (four) hours as needed (wheezing and SOB).   VENTOLIN HFA IN Inhale 2 puffs into the lungs every 4 (four) hours as needed (wheezing  and SOB).   aspirin 81 MG chewable tablet Chew 1 tablet (81 mg total) by mouth daily.   cyclobenzaprine 10 MG tablet Commonly known as:  FLEXERIL Take 10 mg by mouth at bedtime as needed for muscle spasms.   docusate sodium 100 MG capsule Commonly known as:  COLACE Take 1 capsule (100 mg total) by mouth 2 (two) times daily.   HYDROcodone-acetaminophen 5-325 MG tablet Commonly known as:  NORCO/VICODIN Take 1 tablet by mouth every 4 (four) hours as needed for moderate pain (pain score 4-6).   ibuprofen 200 MG tablet Commonly known as:  ADVIL,MOTRIN Take 600 mg by mouth 2 (two) times daily as needed for moderate pain.   irbesartan-hydrochlorothiazide 150-12.5 MG tablet Commonly known as:  AVALIDE Take 1 tablet by mouth daily.   levocetirizine 5  MG tablet Commonly known as:  XYZAL Take 5 mg by mouth every evening.   losartan-hydrochlorothiazide 50-12.5 MG tablet Commonly known as:  HYZAAR Take 1 tablet by mouth daily.   multivitamin tablet Take 1 tablet by mouth daily.   ondansetron 4 MG tablet Commonly known as:  ZOFRAN Take 1 tablet (4 mg total) by mouth every 6 (six) hours as needed for nausea.   rosuvastatin 10 MG tablet Commonly known as:  CRESTOR Take 10 mg by mouth daily.   senna 8.6 MG Tabs tablet Commonly known as:  SENOKOT Take 1 tablet (8.6 mg total) by mouth at bedtime.   ZINC PO Take 1 tablet by mouth daily.       Diagnostic Studies: Dg Pelvis Portable  Result Date: 04/25/2018 CLINICAL DATA:  Left hip replacement EXAM: PORTABLE PELVIS 1-2 VIEWS; OPERATIVE LEFT HIP WITH PELVIS COMPARISON:  None. FINDINGS: Left hip replacement in satisfactory position alignment. No acute complication. IMPRESSION: Satisfactory left hip replacement Electronically Signed   By: Franchot Gallo M.D.   On: 04/25/2018 11:18   Dg C-arm 1-60 Min-no Report  Result Date: 04/25/2018 Fluoroscopy was utilized by the requesting physician.  No radiographic interpretation.   Dg Hip Operative Unilat W Or W/o Pelvis Left  Result Date: 04/25/2018 CLINICAL DATA:  Left hip replacement EXAM: PORTABLE PELVIS 1-2 VIEWS; OPERATIVE LEFT HIP WITH PELVIS COMPARISON:  None. FINDINGS: Left hip replacement in satisfactory position alignment. No acute complication. IMPRESSION: Satisfactory left hip replacement Electronically Signed   By: Franchot Gallo M.D.   On: 04/25/2018 11:18    Disposition: Discharge disposition: 01-Home or Self Care       Discharge Instructions    Call MD / Call 911   Complete by:  As directed    If you experience chest pain or shortness of breath, CALL 911 and be transported to the hospital emergency room.  If you develope a fever above 101 F, pus (white drainage) or increased drainage or redness at the wound, or calf  pain, call your surgeon's office.   Constipation Prevention   Complete by:  As directed    Drink plenty of fluids.  Prune juice may be helpful.  You may use a stool softener, such as Colace (over the counter) 100 mg twice a day.  Use MiraLax (over the counter) for constipation as needed.   Diet - low sodium heart healthy   Complete by:  As directed    Driving restrictions   Complete by:  As directed    No driving for 4 weeks   Increase activity slowly as tolerated   Complete by:  As directed    Lifting restrictions   Complete by:  As directed    No lifting for 6 weeks   TED hose   Complete by:  As directed    Use stockings (TED hose) for 2 weeks on both leg(s).  You may remove them at night for sleeping.      Follow-up Information    Hadleigh Felber, Aaron Edelman, MD. Schedule an appointment as soon as possible for a visit in 2 weeks.   Specialty:  Orthopedic Surgery Why:  For wound re-check Contact information: [address] [place] [address] [phone]            Signed: Hilton Cork Melina Mosteller 04/26/2018, 10:30 AM

## 2018-04-26 NOTE — Progress Notes (Signed)
Physical Therapy Treatment Patient Details Name: Darlene Crosby MRN: [id number] DOB: 15-Sep-1965 Today's Date: 04/26/2018    History of Present Illness 53 yo female s/p L DA-THA on 04/25/18. PMH includes dyspnea, asthma, HTN, pyogenic granuloma with excision of mass.     PT Comments    The patient is progressing well . Plans DC home  After next PT.  Follow Up Recommendations  Follow surgeon's recommendation for DC plan and follow-up therapies;Supervision for mobility/OOB;No PT follow up     Equipment Recommendations  3in1 (PT);Rolling walker with 5" wheels    Recommendations for Other Services       Precautions / Restrictions Precautions Precautions: Fall Restrictions LLE Weight Bearing: Weight bearing as tolerated    Mobility  Bed Mobility   Bed Mobility: Supine to Sit;Sit to Supine     Supine to sit: Supervision Sit to supine: Supervision   General bed mobility comments: used gait belt around the  left leg to self assist.  Transfers Overall transfer level: Needs assistance Equipment used: Rolling walker (2 wheeled) Transfers: Sit to/from Stand Sit to Stand: Supervision            Ambulation/Gait Ambulation/Gait assistance: Supervision Gait Distance (Feet): 200 Feet Assistive device: Rolling walker (2 wheeled) Gait Pattern/deviations: Step-through pattern     General Gait Details: gait is  smoothe, not antalgic.    Stairs             Wheelchair Mobility    Modified Rankin (Stroke Patients Only)       Balance                                            Cognition Arousal/Alertness: Awake/alert                                            Exercises      General Comments        Pertinent Vitals/Pain Pain Score: 3  Pain Location: L hip  Pain Descriptors / Indicators: Sore;Tightness Pain Intervention(s): Monitored during session;Premedicated before session;Ice applied    Home Living                      Prior Function            PT Goals (current goals can now be found in the care plan section) Progress towards PT goals: Progressing toward goals    Frequency    7X/week      PT Plan Current plan remains appropriate    Co-evaluation              AM-PAC PT "6 Clicks" Mobility   Outcome Measure  Help needed turning from your back to your side while in a flat bed without using bedrails?: A Little Help needed moving from lying on your back to sitting on the side of a flat bed without using bedrails?: A Little Help needed moving to and from a bed to a chair (including a wheelchair)?: A Little Help needed standing up from a chair using your arms (e.g., wheelchair or bedside chair)?: A Little Help needed to walk in hospital room?: A Little Help needed climbing 3-5 steps with a railing? : A Lot 6 Click Score: 17    End  of Session   Activity Tolerance: Patient tolerated treatment well Patient left: in bed;with call bell/phone within reach Nurse Communication: Mobility status PT Visit Diagnosis: Other abnormalities of gait and mobility (R26.89);Difficulty in walking, not elsewhere classified (R26.2)     Time: 5400-8676 PT Time Calculation (min) (ACUTE ONLY): 11 min  Charges:  $Gait Training: 8-22 mins                     Tresa Endo PT Acute Rehabilitation Services Pager [phone] Office [phone]    Claretha Cooper 04/26/2018, 1:43 PM

## 2018-04-26 NOTE — Progress Notes (Signed)
    Subjective:  Patient reports pain as mild to moderate.  Denies N/V/CP/SOB. No c/o.  Objective:   VITALS:   Vitals:   04/25/18 2232 04/26/18 0237 04/26/18 0609 04/26/18 0949  BP: (!) 121/97 109/70 105/69 139/88  Pulse: 63 65 68 68  Resp: 17 17 17 16   Temp: (!) 97.4 F (36.3 C) 97.6 F (36.4 C) 97.8 F (36.6 C) 97.8 F (36.6 C)  TempSrc: Oral Oral Oral Oral  SpO2: 99% 100% 97% 97%  Weight:      Height:        NAD ABD soft Sensation intact distally Intact pulses distally Dorsiflexion/Plantar flexion intact Incision: dressing C/D/I Compartment soft   Lab Results  Component Value Date   WBC 14.0 (H) 04/26/2018   HGB 11.1 (L) 04/26/2018   HCT 34.2 (L) 04/26/2018   MCV 88.8 04/26/2018   PLT 256 04/26/2018   BMET    Component Value Date/Time   NA 140 04/26/2018 0500   K 3.9 04/26/2018 0500   CL 112 (H) 04/26/2018 0500   CO2 23 04/26/2018 0500   GLUCOSE 158 (H) 04/26/2018 0500   BUN 11 04/26/2018 0500   CREATININE 0.84 04/26/2018 0500   CREATININE 0.96 12/27/2014 1022   CALCIUM 8.3 (L) 04/26/2018 0500   GFRNONAA >60 04/26/2018 0500   GFRAA >60 04/26/2018 0500     Assessment/Plan: 1 Day Post-Op   Principal Problem:   Osteoarthritis of left hip   WBAT with walker DVT ppx: Aspirin, SCDs, TEDS PO pain control PT/OT Dispo: D/c home with HEP   Hilton Cork Ambrosio Reuter 04/26/2018, 10:23 AM   Rod Can, MD Cell: [phone] [place] [facility] now [facility]  [place] [address], [address], [place], [place] [address] Phone: [phone] [email or web address] Facebook  [employer]

## 2018-04-26 NOTE — Plan of Care (Signed)
  Problem: Pain Managment: Goal: General experience of comfort will improve Outcome: Progressing   Problem: Coping: Goal: Level of anxiety will decrease Outcome: Progressing   Problem: Activity: Goal: Risk for activity intolerance will decrease Outcome: Progressing   Problem: Clinical Measurements: Goal: Cardiovascular complication will be avoided Outcome: Progressing

## 2018-04-26 NOTE — Care Management Note (Signed)
Case Management Note  Patient Details  Name: DYONNA JASPERS MRN: [id number] Date of Birth: 1965/11/18  Subjective/Objective:                  Discharge planning  Action/Plan: [facility] dme has needed equipment at home Expected Discharge Date:  04/26/18               Expected Discharge Plan:  Home/Self Care  In-House Referral:     Discharge planning Services  CM Consult  Post Acute Care Choice:    Choice offered to:     DME Arranged:    DME Agency:     HH Arranged:    [place] Agency:     Status of Service:  Completed, signed off  If discussed at [employer] of Stay Meetings, dates discussed:    Additional Comments:  Leeroy Cha, RN 04/26/2018, 1:59 PM

## 2018-04-26 NOTE — Progress Notes (Signed)
Physical Therapy Treatment Patient Details Name: Darlene Crosby MRN: [id number] DOB: 1965/10/14 Today's Date: 04/26/2018    History of Present Illness 53 yo female s/p L DA-THA on 04/25/18. PMH includes dyspnea, asthma, HTN, pyogenic granuloma with excision of mass.     PT Comments    Ready for DC   Follow Up Recommendations  Follow surgeon's recommendation for DC plan and follow-up therapies;Supervision for mobility/OOB;No PT follow up     Equipment Recommendations  3in1 (PT);Rolling walker with 5" wheels    Recommendations for Other Services       Precautions / Restrictions Precautions Precautions: Fall Restrictions LLE Weight Bearing: Weight bearing as tolerated    Mobility  Bed Mobility   Bed Mobility: Supine to Sit;Sit to Supine     Supine to sit: Supervision Sit to supine: Supervision   General bed mobility comments: used gait belt around the  left leg to self assist.  Transfers Overall transfer level: Needs assistance Equipment used: Rolling walker (2 wheeled) Transfers: Sit to/from Stand Sit to Stand: Supervision            Ambulation/Gait Ambulation/Gait assistance: Supervision Gait Distance (Feet): 50 Feet Assistive device: Rolling walker (2 wheeled) Gait Pattern/deviations: Step-through pattern     General Gait Details: gait is  smoothe, not antalgic.    Stairs Stairs: Yes Stairs assistance: Min assist Stair Management: No rails;Forwards Number of Stairs: 1 General stair comments: cues for sequence   Wheelchair Mobility    Modified Rankin (Stroke Patients Only)       Balance                                            Cognition Arousal/Alertness: Awake/alert                                            Exercises Total Joint Exercises Ankle Circles/Pumps: AROM;Both;10 reps;Supine Quad Sets: AROM;Both;10 reps;Supine Short Arc Quad: AROM;Supine Heel Slides: AAROM;10 reps;Left Hip  ABduction/ADduction: Left;10 reps;AAROM Long Arc Quad: AAROM;Left;10 reps;Seated Knee Flexion: AROM;Standing;Left;10 reps Marching in Standing: AROM;AAROM;Seated;Standing Standing Hip Extension: AROM;Left;10 reps    General Comments        Pertinent Vitals/Pain Pain Score: 2  Pain Location: L hip  Pain Descriptors / Indicators: Sore;Tightness Pain Intervention(s): Limited activity within patient's tolerance;Monitored during session;Premedicated before session;Ice applied    Home Living                      Prior Function            PT Goals (current goals can now be found in the care plan section) Progress towards PT goals: Progressing toward goals    Frequency    7X/week      PT Plan Current plan remains appropriate    Co-evaluation              AM-PAC PT "6 Clicks" Mobility   Outcome Measure  Help needed turning from your back to your side while in a flat bed without using bedrails?: A Little Help needed moving from lying on your back to sitting on the side of a flat bed without using bedrails?: A Little Help needed moving to and from a bed to a chair (including a wheelchair)?: A Little  Help needed standing up from a chair using your arms (e.g., wheelchair or bedside chair)?: A Little Help needed to walk in hospital room?: A Little Help needed climbing 3-5 steps with a railing? : A Little 6 Click Score: 18    End of Session   Activity Tolerance: Patient tolerated treatment well Patient left: in chair;with call bell/phone within reach Nurse Communication: Mobility status PT Visit Diagnosis: Unsteadiness on feet (R26.81)     Time: 5259-1028 PT Time Calculation (min) (ACUTE ONLY): 20 min  Charges:  $Gait Training: 8-22 mins                     [place] Pager [phone] Office [phone]    Claretha Cooper 04/26/2018, 1:50 PM

## 2018-04-26 NOTE — Plan of Care (Signed)
  Problem: Health Behavior/Discharge Planning: Goal: Ability to manage health-related needs will improve Outcome: Progressing   Problem: Clinical Measurements: Goal: Ability to maintain clinical measurements within normal limits will improve Outcome: Progressing Goal: Will remain free from infection Outcome: Progressing Goal: Diagnostic test results will improve Outcome: Progressing Goal: Respiratory complications will improve Outcome: Progressing Goal: Cardiovascular complication will be avoided Outcome: Progressing   Problem: Activity: Goal: Risk for activity intolerance will decrease Outcome: Progressing   Problem: Coping: Goal: Level of anxiety will decrease Outcome: Progressing   Problem: Elimination: Goal: Will not experience complications related to bowel motility Outcome: Progressing Goal: Will not experience complications related to urinary retention Outcome: Progressing   Problem: Pain Managment: Goal: General experience of comfort will improve Outcome: Progressing   Problem: Safety: Goal: Ability to remain free from injury will improve Outcome: Progressing   Problem: Skin Integrity: Goal: Risk for impaired skin integrity will decrease Outcome: Progressing   Problem: Activity: Goal: Ability to avoid complications of mobility impairment will improve Outcome: Progressing Goal: Ability to tolerate increased activity will improve Outcome: Progressing   Problem: Clinical Measurements: Goal: Postoperative complications will be avoided or minimized Outcome: Progressing   Problem: Pain Management: Goal: Pain level will decrease with appropriate interventions Outcome: Progressing   Problem: Skin Integrity: Goal: Will show signs of wound healing Outcome: Progressing  Pt alert and oriented, pain well controlled. Voiding, up with assistance. Doing well. Plan to d/c per MD order.

## 2018-04-26 NOTE — Progress Notes (Signed)
Discharge paperwork discussed with pt at the bedside. . She demonstrated understanding. Pt to let staff know when daughter arrives at main entrance and staff will help escort her to main lobby by wheelchair. Pt doing well, ready for d/c.

## 2018-05-03 ENCOUNTER — Encounter (HOSPITAL_COMMUNITY): Payer: Self-pay | Admitting: Orthopedic Surgery

## 2018-05-03 MED ORDER — SODIUM CHLORIDE 0.9 % IR SOLN
Status: DC | PRN
  Administered 2018-04-25: 1000 mL

## 2018-05-03 NOTE — OR Nursing (Signed)
I adjusted an OR medication error on 05/03/18 as instructed by Raquel James from pharmacy

## 2018-06-05 ENCOUNTER — Other Ambulatory Visit: Payer: Self-pay

## 2018-06-05 ENCOUNTER — Ambulatory Visit: Payer: Self-pay | Admitting: Orthopedic Surgery

## 2018-06-05 ENCOUNTER — Encounter (HOSPITAL_COMMUNITY): Payer: Self-pay | Admitting: Emergency Medicine

## 2018-06-05 MED ORDER — VANCOMYCIN HCL 10 G IV SOLR
1500.0000 mg | INTRAVENOUS | Status: AC
  Administered 2018-06-06: 1500 mg via INTRAVENOUS
  Filled 2018-06-05: qty 1500

## 2018-06-05 NOTE — Progress Notes (Signed)
Anesthesia Chart Review   Case:  [id number] Date/Time:  06/06/18 1330   Procedure:  LEFT HIP WOUND DEHISCENCE POSSIBLE HEAD AND LINER EXCHANGE (Left )   Anesthesia type:  Choice   Pre-op diagnosis:  left hip wound dehiscence   Location:  [facility] ROOM 10 / [facility]   Surgeon:  Rod Can, MD      DISCUSSION: 53 yo never smoker with h/o HTN, asthma, s/p left total hip arthroplasty 04/26/18 with left hip wound dehiscence scheduled for above procedure 06/06/18 with Dr. Rod Can.   Anesthesia records reviewed, THA 04/26/18 with spinal anesthesia, no complications noted. Cleared by PCP, Dr. Daiva Eves prior to this surgery (clearance on chart).   Pt can proceed with planned procedure barring acute status change and after evaluation DOS (same day workup).  VS: LMP 05/04/2012   PROVIDERS: Daiva Eves, MD is PCP  LABS: Labs DOS (all labs ordered are listed, but only abnormal results are displayed)  Labs Reviewed - No data to display   IMAGES:   EKG: 04/02/2018 (on Chart) Rate 71 bpm Sinus rhythm   CV:  Past Medical History:  Diagnosis Date  . Asthma    followed by pcp  . Hypertension   . OA (osteoarthritis)    left knee, right shoulder, left hip  . Seasonal allergies   . Wears glasses     Past Surgical History:  Procedure Laterality Date  . COLONOSCOPY  2015  . DIAGNOSTIC LAPAROSCOPY     tubal preg-took ovary and tube  . LAPAROSCOPY FOR ECTOPIC PREGNANCY  1990s   LEFT SALPINGECTOMY AND RIGHT OOPHORECTOMY FOR ABNORMALITY  . MASS EXCISION  02/15/2012   Procedure: EXCISION MASS;  Surgeon: Schuyler Amor, MD;  Location: [facility];  Service: Orthopedics;  Laterality: Right;  Excision of Right Small Volar Mass  . TONSILLECTOMY  age 15  . TOTAL HIP ARTHROPLASTY Left 04/25/2018   Procedure: TOTAL HIP ARTHROPLASTY ANTERIOR APPROACH;  Surgeon: Rod Can, MD;  Location: [facility];  Service: Orthopedics;  Laterality: Left;  . TRIGGER FINGER RELEASE  Right 2018   thumb  . WISDOM TOOTH EXTRACTION  age 33    MEDICATIONS: . [START ON 06/06/2018] vancomycin (VANCOCIN) 1,500 mg in sodium chloride 0.9 % 500 mL IVPB   . albuterol (PROVENTIL) (2.5 MG/3ML) 0.083% nebulizer solution  . Albuterol Sulfate (PROAIR HFA IN)  . Albuterol Sulfate (VENTOLIN HFA IN)  . aspirin 81 MG chewable tablet  . cyclobenzaprine (FLEXERIL) 10 MG tablet  . docusate sodium (COLACE) 100 MG capsule  . HYDROcodone-acetaminophen (NORCO/VICODIN) 5-325 MG tablet  . ibuprofen (ADVIL,MOTRIN) 200 MG tablet  . irbesartan-hydrochlorothiazide (AVALIDE) 150-12.5 MG per tablet  . levocetirizine (XYZAL) 5 MG tablet  . losartan-hydrochlorothiazide (HYZAAR) 50-12.5 MG tablet  . Multiple Vitamin (MULTIVITAMIN) tablet  . Multiple Vitamins-Minerals (ZINC PO)  . ondansetron (ZOFRAN) 4 MG tablet  . rosuvastatin (CRESTOR) 10 MG tablet  . senna (SENOKOT) 8.6 MG TABS tablet   Maia Plan [facility]Surgical Testing [phone] 06/05/18 1:18 PM

## 2018-06-05 NOTE — Anesthesia Preprocedure Evaluation (Addendum)
Anesthesia Evaluation  Patient identified by MRN, date of birth, ID band Patient awake    Reviewed: Allergy & Precautions, NPO status , Patient's Chart, lab work & pertinent test results  History of Anesthesia Complications Negative for: history of anesthetic complications  Airway Mallampati: II  TM Distance: >3 FB Neck ROM: Full    Dental  (+) Dental Advisory Given, Teeth Intact   Pulmonary asthma ,    breath sounds clear to auscultation       Cardiovascular Exercise Tolerance: Good hypertension, Pt. on medications  Rhythm:Regular Rate:Normal     Neuro/Psych negative neurological ROS  negative psych ROS   GI/Hepatic negative GI ROS, Neg liver ROS,   Endo/Other  Morbid obesity  Renal/GU negative Renal ROS     Musculoskeletal  (+) Arthritis ,   Abdominal   Peds  Hematology negative hematology ROS (+)   Anesthesia Other Findings   Reproductive/Obstetrics                           Anesthesia Physical Anesthesia Plan  ASA: II  Anesthesia Plan: General   Post-op Pain Management:    Induction: Intravenous  PONV Risk Score and Plan: 3 and Treatment may vary due to age or medical condition, Ondansetron, Dexamethasone and Midazolam  Airway Management Planned: Oral ETT  Additional Equipment: None  Intra-op Plan:   Post-operative Plan: Extubation in OR  Informed Consent: I have reviewed the patients History and Physical, chart, labs and discussed the procedure including the risks, benefits and alternatives for the proposed anesthesia with the patient or authorized representative who has indicated his/her understanding and acceptance.     Dental advisory given  Plan Discussed with: CRNA and Anesthesiologist  Anesthesia Plan Comments:       Anesthesia Quick Evaluation

## 2018-06-05 NOTE — Progress Notes (Signed)
SPOKE W/  _ patient      SCREENING SYMPTOMS OF COVID 19:   COUGH--denies   RUNNY NOSE--- denies   SORE THROAT---denies   NASAL CONGESTION----denies   SNEEZING----denies   SHORTNESS OF BREATH---denies   DIFFICULTY BREATHING---denies   TEMP >100.4-----denies   UNEXPLAINED BODY ACHES------denies    HAVE YOU OR ANY FAMILY MEMBER TRAVELLED PAST 14 DAYS OUT OF THE   COUNTY---denies  STATE----denies  COUNTRY----denies   HAVE YOU OR ANY FAMILY MEMBER BEEN EXPOSED TO ANYONE WITH COVID 19?   denies   

## 2018-06-06 ENCOUNTER — Other Ambulatory Visit: Payer: Self-pay

## 2018-06-06 ENCOUNTER — Encounter (HOSPITAL_COMMUNITY): Payer: Self-pay | Admitting: Emergency Medicine

## 2018-06-06 ENCOUNTER — Inpatient Hospital Stay (HOSPITAL_COMMUNITY)
Admission: RE | Admit: 2018-06-06 | Discharge: 2018-06-07 | DRG: 903 | Disposition: A | Discharge status: Home / Self Care | Payer: BLUE CROSS/BLUE SHIELD | Attending: Orthopedic Surgery | Admitting: Orthopedic Surgery

## 2018-06-06 ENCOUNTER — Encounter (HOSPITAL_COMMUNITY)
Admission: RE | Disposition: A | Discharge status: Home / Self Care | Payer: Self-pay | Source: Home / Self Care | Attending: Orthopedic Surgery

## 2018-06-06 ENCOUNTER — Inpatient Hospital Stay (HOSPITAL_COMMUNITY): Payer: BLUE CROSS/BLUE SHIELD | Admitting: Physician Assistant

## 2018-06-06 DIAGNOSIS — Z96642 Presence of left artificial hip joint: Secondary | ICD-10-CM | POA: Diagnosis present

## 2018-06-06 DIAGNOSIS — Z6836 Body mass index (BMI) 36.0-36.9, adult: Secondary | ICD-10-CM

## 2018-06-06 DIAGNOSIS — Z79899 Other long term (current) drug therapy: Secondary | ICD-10-CM

## 2018-06-06 DIAGNOSIS — Z7982 Long term (current) use of aspirin: Secondary | ICD-10-CM | POA: Diagnosis not present

## 2018-06-06 DIAGNOSIS — Y848 Other medical procedures as the cause of abnormal reaction of the patient, or of later complication, without mention of misadventure at the time of the procedure: Secondary | ICD-10-CM | POA: Diagnosis present

## 2018-06-06 DIAGNOSIS — Z90721 Acquired absence of ovaries, unilateral: Secondary | ICD-10-CM

## 2018-06-06 DIAGNOSIS — I10 Essential (primary) hypertension: Secondary | ICD-10-CM | POA: Diagnosis present

## 2018-06-06 DIAGNOSIS — T8131XA Disruption of external operation (surgical) wound, not elsewhere classified, initial encounter: Secondary | ICD-10-CM | POA: Diagnosis present

## 2018-06-06 DIAGNOSIS — J45909 Unspecified asthma, uncomplicated: Secondary | ICD-10-CM | POA: Diagnosis present

## 2018-06-06 HISTORY — PX: ANTERIOR HIP REVISION: SHX6527

## 2018-06-06 LAB — CBC
HCT: 39.6 % (ref 36.0–46.0)
Hemoglobin: 13 g/dL (ref 12.0–15.0)
MCH: 28.3 pg (ref 26.0–34.0)
MCHC: 32.8 g/dL (ref 30.0–36.0)
MCV: 86.1 fL (ref 80.0–100.0)
Platelets: 334 10*3/uL (ref 150–400)
RBC: 4.6 MIL/uL (ref 3.87–5.11)
RDW: 12.9 % (ref 11.5–15.5)
WBC: 7.6 10*3/uL (ref 4.0–10.5)
nRBC: 0 % (ref 0.0–0.2)

## 2018-06-06 LAB — BASIC METABOLIC PANEL
Anion gap: 9 (ref 5–15)
BUN: 15 mg/dL (ref 6–20)
CO2: 25 mmol/L (ref 22–32)
Calcium: 9 mg/dL (ref 8.9–10.3)
Chloride: 105 mmol/L (ref 98–111)
Creatinine, Ser: 0.78 mg/dL (ref 0.44–1.00)
GFR calc Af Amer: >60 mL/min (ref >60)
GFR calc non Af Amer: >60 mL/min (ref >60)
Glucose, Bld: 107 mg/dL — ABNORMAL HIGH (ref 70–99)
Potassium: 3 mmol/L — ABNORMAL LOW (ref 3.5–5.1)
Sodium: 139 mmol/L (ref 135–145)

## 2018-06-06 SURGERY — REVISION, TOTAL ARTHROPLASTY, HIP, ANTERIOR APPROACH
Anesthesia: General | Laterality: Left

## 2018-06-06 MED ORDER — POLYETHYLENE GLYCOL 3350 17 G PO PACK: 17.0000 g | PACK | Freq: Every day | ORAL | Status: DC | PRN

## 2018-06-06 MED ORDER — LIDOCAINE 2% (20 MG/ML) 5 ML SYRINGE
INTRAMUSCULAR | Status: DC | PRN
  Administered 2018-06-06: 80 mg via INTRAVENOUS
  Administered 2018-06-06: 50 mg via INTRAVENOUS

## 2018-06-06 MED ORDER — HYDROCHLOROTHIAZIDE 12.5 MG PO CAPS
12.5000 mg | ORAL_CAPSULE | Freq: Every day | ORAL | Status: DC
  Administered 2018-06-06 – 2018-06-07 (×2): 12.5 mg via ORAL
  Filled 2018-06-06 (×2): qty 1

## 2018-06-06 MED ORDER — OXYCODONE HCL 5 MG/5ML PO SOLN: 5.0000 mg | Freq: Once | ORAL | Status: DC | PRN

## 2018-06-06 MED ORDER — MIDAZOLAM HCL 2 MG/2ML IJ SOLN
INTRAMUSCULAR | Status: AC
  Filled 2018-06-06: qty 2

## 2018-06-06 MED ORDER — ASPIRIN 81 MG PO CHEW
81.0000 mg | CHEWABLE_TABLET | Freq: Every day | ORAL | Status: DC
  Administered 2018-06-06 – 2018-06-07 (×2): 81 mg via ORAL
  Filled 2018-06-06 (×2): qty 1

## 2018-06-06 MED ORDER — METOCLOPRAMIDE HCL 5 MG/ML IJ SOLN: 5.0000 mg | Freq: Three times a day (TID) | INTRAMUSCULAR | Status: DC | PRN

## 2018-06-06 MED ORDER — HYDROCODONE-ACETAMINOPHEN 7.5-325 MG PO TABS
1.0000 | ORAL_TABLET | ORAL | Status: DC | PRN
  Administered 2018-06-06: 22:00:00 2 via ORAL
  Filled 2018-06-06: qty 2

## 2018-06-06 MED ORDER — IBUPROFEN 200 MG PO TABS: 600.0000 mg | ORAL_TABLET | Freq: Two times a day (BID) | ORAL | Status: DC | PRN

## 2018-06-06 MED ORDER — SODIUM CHLORIDE (PF) 0.9 % IJ SOLN
INTRAMUSCULAR | Status: AC
  Filled 2018-06-06: qty 10

## 2018-06-06 MED ORDER — PROPOFOL 10 MG/ML IV BOLUS
INTRAVENOUS | Status: DC | PRN
  Administered 2018-06-06: 160 mg via INTRAVENOUS

## 2018-06-06 MED ORDER — VANCOMYCIN HCL 1000 MG IV SOLR
1000.0000 mg | Freq: Two times a day (BID) | INTRAVENOUS | Status: DC
  Administered 2018-06-06: 1000 mg via INTRAVENOUS
  Filled 2018-06-06 (×2): qty 1000

## 2018-06-06 MED ORDER — FENTANYL CITRATE (PF) 100 MCG/2ML IJ SOLN
25.0000 ug | INTRAMUSCULAR | Status: DC | PRN
  Administered 2018-06-06 (×2): 50 ug via INTRAVENOUS

## 2018-06-06 MED ORDER — DEXAMETHASONE SODIUM PHOSPHATE 10 MG/ML IJ SOLN
INTRAMUSCULAR | Status: DC | PRN
  Administered 2018-06-06: 10 mg via INTRAVENOUS

## 2018-06-06 MED ORDER — ONDANSETRON HCL 4 MG/2ML IJ SOLN
INTRAMUSCULAR | Status: DC | PRN
  Administered 2018-06-06: 4 mg via INTRAVENOUS

## 2018-06-06 MED ORDER — PROMETHAZINE HCL 25 MG/ML IJ SOLN: 6.2500 mg | INTRAMUSCULAR | Status: DC | PRN

## 2018-06-06 MED ORDER — ONDANSETRON HCL 4 MG/2ML IJ SOLN
4.0000 mg | Freq: Four times a day (QID) | INTRAMUSCULAR | Status: DC | PRN
  Administered 2018-06-06: 4 mg via INTRAVENOUS
  Filled 2018-06-06: qty 2

## 2018-06-06 MED ORDER — TRANEXAMIC ACID-NACL 1000-0.7 MG/100ML-% IV SOLN
1000.0000 mg | INTRAVENOUS | Status: AC
  Administered 2018-06-06: 1000 mg via INTRAVENOUS
  Filled 2018-06-06: qty 100

## 2018-06-06 MED ORDER — DIPHENHYDRAMINE HCL 12.5 MG/5ML PO ELIX: 12.5000 mg | ORAL_SOLUTION | ORAL | Status: DC | PRN

## 2018-06-06 MED ORDER — KETOROLAC TROMETHAMINE 30 MG/ML IJ SOLN
INTRAMUSCULAR | Status: AC
  Filled 2018-06-06: qty 1

## 2018-06-06 MED ORDER — LOSARTAN POTASSIUM 50 MG PO TABS
50.0000 mg | ORAL_TABLET | Freq: Every day | ORAL | Status: DC
  Administered 2018-06-06 – 2018-06-07 (×2): 50 mg via ORAL
  Filled 2018-06-06 (×2): qty 1

## 2018-06-06 MED ORDER — METOCLOPRAMIDE HCL 5 MG PO TABS: 5.0000 mg | ORAL_TABLET | Freq: Three times a day (TID) | ORAL | Status: DC | PRN

## 2018-06-06 MED ORDER — ACETAMINOPHEN 325 MG PO TABS: 325.0000 mg | ORAL_TABLET | Freq: Four times a day (QID) | ORAL | Status: DC | PRN

## 2018-06-06 MED ORDER — HYDROCODONE-ACETAMINOPHEN 5-325 MG PO TABS
1.0000 | ORAL_TABLET | ORAL | Status: DC | PRN
  Administered 2018-06-06: 2 via ORAL
  Filled 2018-06-06: qty 2

## 2018-06-06 MED ORDER — FENTANYL CITRATE (PF) 100 MCG/2ML IJ SOLN
INTRAMUSCULAR | Status: DC | PRN
  Administered 2018-06-06 (×4): 50 ug via INTRAVENOUS

## 2018-06-06 MED ORDER — SUGAMMADEX SODIUM 500 MG/5ML IV SOLN
INTRAVENOUS | Status: DC | PRN
  Administered 2018-06-06: 200 mg via INTRAVENOUS

## 2018-06-06 MED ORDER — MIDAZOLAM HCL 5 MG/5ML IJ SOLN
INTRAMUSCULAR | Status: DC | PRN
  Administered 2018-06-06: 2 mg via INTRAVENOUS

## 2018-06-06 MED ORDER — CHLORHEXIDINE GLUCONATE 4 % EX LIQD: 60.0000 mL | Freq: Once | CUTANEOUS | Status: DC

## 2018-06-06 MED ORDER — OXYCODONE HCL 5 MG PO TABS: 5.0000 mg | ORAL_TABLET | Freq: Once | ORAL | Status: DC | PRN

## 2018-06-06 MED ORDER — PROPOFOL 10 MG/ML IV BOLUS
INTRAVENOUS | Status: AC
  Filled 2018-06-06: qty 20

## 2018-06-06 MED ORDER — ISOPROPYL ALCOHOL 70 % SOLN
Status: AC
  Filled 2018-06-06: qty 480

## 2018-06-06 MED ORDER — ONE-DAILY MULTI VITAMINS PO TABS: 1.0000 | ORAL_TABLET | Freq: Every day | ORAL | Status: DC

## 2018-06-06 MED ORDER — VANCOMYCIN HCL 10 G IV SOLR: 1500.0000 mg | INTRAVENOUS | Status: DC

## 2018-06-06 MED ORDER — SODIUM CHLORIDE (PF) 0.9 % IJ SOLN
INTRAMUSCULAR | Status: AC
  Filled 2018-06-06: qty 20

## 2018-06-06 MED ORDER — SUCCINYLCHOLINE CHLORIDE 200 MG/10ML IV SOSY
PREFILLED_SYRINGE | INTRAVENOUS | Status: DC | PRN
  Administered 2018-06-06: 120 mg via INTRAVENOUS

## 2018-06-06 MED ORDER — FENTANYL CITRATE (PF) 250 MCG/5ML IJ SOLN
INTRAMUSCULAR | Status: AC
  Filled 2018-06-06: qty 5

## 2018-06-06 MED ORDER — ADULT MULTIVITAMIN W/MINERALS CH
1.0000 | ORAL_TABLET | Freq: Every day | ORAL | Status: DC
  Administered 2018-06-07: 10:00:00 1 via ORAL
  Filled 2018-06-06: qty 1

## 2018-06-06 MED ORDER — ISOPROPYL ALCOHOL 70 % SOLN
Status: DC | PRN
  Administered 2018-06-06: 1 via TOPICAL

## 2018-06-06 MED ORDER — VITAMIN C 500 MG PO TABS
1000.0000 mg | ORAL_TABLET | Freq: Two times a day (BID) | ORAL | Status: DC
  Administered 2018-06-06 – 2018-06-07 (×2): 1000 mg via ORAL
  Filled 2018-06-06 (×2): qty 2

## 2018-06-06 MED ORDER — LIDOCAINE 2% (20 MG/ML) 5 ML SYRINGE
INTRAMUSCULAR | Status: AC
  Filled 2018-06-06: qty 5

## 2018-06-06 MED ORDER — ROSUVASTATIN CALCIUM 10 MG PO TABS
10.0000 mg | ORAL_TABLET | Freq: Every day | ORAL | Status: DC
  Administered 2018-06-06: 22:00:00 10 mg via ORAL
  Filled 2018-06-06: qty 1

## 2018-06-06 MED ORDER — LEVOCETIRIZINE DIHYDROCHLORIDE 5 MG PO TABS: 5.0000 mg | ORAL_TABLET | Freq: Every evening | ORAL | Status: DC

## 2018-06-06 MED ORDER — FENTANYL CITRATE (PF) 100 MCG/2ML IJ SOLN
INTRAMUSCULAR | Status: AC
  Administered 2018-06-06: 15:00:00 50 ug via INTRAVENOUS
  Filled 2018-06-06: qty 2

## 2018-06-06 MED ORDER — WATER FOR IRRIGATION, STERILE IR SOLN: Status: DC | PRN

## 2018-06-06 MED ORDER — LACTATED RINGERS IV SOLN
INTRAVENOUS | Status: DC
  Administered 2018-06-06 (×2): via INTRAVENOUS

## 2018-06-06 MED ORDER — SODIUM CHLORIDE 0.9 % IR SOLN
Status: DC | PRN
  Administered 2018-06-06: 3000 mL

## 2018-06-06 MED ORDER — LORATADINE 10 MG PO TABS
10.0000 mg | ORAL_TABLET | Freq: Every day | ORAL | Status: DC
  Administered 2018-06-06 – 2018-06-07 (×2): 10 mg via ORAL
  Filled 2018-06-06 (×2): qty 1

## 2018-06-06 MED ORDER — ALBUTEROL SULFATE (2.5 MG/3ML) 0.083% IN NEBU: 2.5000 mg | INHALATION_SOLUTION | RESPIRATORY_TRACT | Status: DC | PRN

## 2018-06-06 MED ORDER — BUPIVACAINE-EPINEPHRINE (PF) 0.25% -1:200000 IJ SOLN
INTRAMUSCULAR | Status: AC
  Filled 2018-06-06: qty 30

## 2018-06-06 MED ORDER — MORPHINE SULFATE (PF) 2 MG/ML IV SOLN: 0.5000 mg | INTRAVENOUS | Status: DC | PRN

## 2018-06-06 MED ORDER — LOSARTAN POTASSIUM-HCTZ 50-12.5 MG PO TABS: 1.0000 | ORAL_TABLET | Freq: Every day | ORAL | Status: DC

## 2018-06-06 MED ORDER — ONDANSETRON HCL 4 MG/2ML IJ SOLN
INTRAMUSCULAR | Status: AC
  Filled 2018-06-06: qty 2

## 2018-06-06 MED ORDER — SENNA 8.6 MG PO TABS
1.0000 | ORAL_TABLET | Freq: Two times a day (BID) | ORAL | Status: DC
  Administered 2018-06-06 – 2018-06-07 (×2): 8.6 mg via ORAL
  Filled 2018-06-06 (×2): qty 1

## 2018-06-06 MED ORDER — ONDANSETRON HCL 4 MG PO TABS: 4.0000 mg | ORAL_TABLET | Freq: Four times a day (QID) | ORAL | Status: DC | PRN

## 2018-06-06 MED ORDER — PHENYLEPHRINE 40 MCG/ML (10ML) SYRINGE FOR IV PUSH (FOR BLOOD PRESSURE SUPPORT)
PREFILLED_SYRINGE | INTRAVENOUS | Status: DC | PRN
  Administered 2018-06-06 (×2): 80 ug via INTRAVENOUS

## 2018-06-06 MED ORDER — DOCUSATE SODIUM 100 MG PO CAPS
100.0000 mg | ORAL_CAPSULE | Freq: Two times a day (BID) | ORAL | Status: DC
  Administered 2018-06-06 – 2018-06-07 (×2): 100 mg via ORAL
  Filled 2018-06-06 (×2): qty 1

## 2018-06-06 MED ORDER — ROCURONIUM BROMIDE 50 MG/5ML IV SOSY
PREFILLED_SYRINGE | INTRAVENOUS | Status: DC | PRN
  Administered 2018-06-06: 40 mg via INTRAVENOUS

## 2018-06-06 MED ORDER — ALBUTEROL SULFATE HFA 108 (90 BASE) MCG/ACT IN AERS: 2.0000 | INHALATION_SPRAY | RESPIRATORY_TRACT | Status: DC | PRN

## 2018-06-06 SURGICAL SUPPLY — 62 items
ADH SKN CLS APL DERMABOND .7 (GAUZE/BANDAGES/DRESSINGS)
APL PRP STRL LF DISP 70% ISPRP (MISCELLANEOUS) ×1
BAG DECANTER FOR FLEXI CONT (MISCELLANEOUS) IMPLANT
BAG SPEC THK2 15X12 ZIP CLS (MISCELLANEOUS)
BAG ZIPLOCK 12X15 (MISCELLANEOUS) IMPLANT
BLADE SURG SZ10 CARB STEEL (BLADE) ×2 IMPLANT
CANISTER WOUNDNEG PRESSURE 500 (CANNISTER) ×2 IMPLANT
CHLORAPREP W/TINT 26 (MISCELLANEOUS) ×3 IMPLANT
CLOTH BEACON ORANGE TIMEOUT ST (SAFETY) ×3 IMPLANT
COVER PERINEAL POST (MISCELLANEOUS) ×3 IMPLANT
COVER SURGICAL LIGHT HANDLE (MISCELLANEOUS) ×3 IMPLANT
COVER WAND RF STERILE (DRAPES) IMPLANT
DECANTER SPIKE VIAL GLASS SM (MISCELLANEOUS) ×3 IMPLANT
DERMABOND ADVANCED (GAUZE/BANDAGES/DRESSINGS)
DERMABOND ADVANCED .7 DNX12 (GAUZE/BANDAGES/DRESSINGS) ×2 IMPLANT
DRAPE IMP U-DRAPE 54X76 (DRAPES) ×3 IMPLANT
DRAPE SHEET LG 3/4 BI-LAMINATE (DRAPES) ×9 IMPLANT
DRAPE STERI IOBAN 125X83 (DRAPES) ×3 IMPLANT
DRAPE U-SHAPE 47X51 STRL (DRAPES) ×6 IMPLANT
DRESSING PREVENA PLUS CUSTOM (GAUZE/BANDAGES/DRESSINGS) IMPLANT
DRSG AQUACEL AG ADV 3.5X10 (GAUZE/BANDAGES/DRESSINGS) ×1 IMPLANT
DRSG PREVENA PLUS CUSTOM (GAUZE/BANDAGES/DRESSINGS) ×3
ELECT BLADE TIP CTD 4 INCH (ELECTRODE) ×3 IMPLANT
ELECT PENCIL ROCKER SW 15FT (MISCELLANEOUS) ×3 IMPLANT
ELECT REM PT RETURN 15FT ADLT (MISCELLANEOUS) ×3 IMPLANT
GAUZE SPONGE 4X4 12PLY STRL (GAUZE/BANDAGES/DRESSINGS) ×3 IMPLANT
GLOVE BIO SURGEON STRL SZ8.5 (GLOVE) ×8 IMPLANT
GLOVE BIOGEL PI IND STRL 8.5 (GLOVE) ×1 IMPLANT
GLOVE BIOGEL PI INDICATOR 8.5 (GLOVE) ×2
GOWN SPEC L3 XXLG W/TWL (GOWN DISPOSABLE) ×3 IMPLANT
HANDPIECE INTERPULSE COAX TIP (DISPOSABLE) ×3
HOLDER FOLEY CATH W/STRAP (MISCELLANEOUS) ×1 IMPLANT
HOOD PEEL AWAY FLYTE STAYCOOL (MISCELLANEOUS) ×12 IMPLANT
JET LAVAGE IRRISEPT WOUND (IRRIGATION / IRRIGATOR) ×3
KIT PREVENA INCISION MGT 13 (CANNISTER) ×2 IMPLANT
KIT TURNOVER KIT A (KITS) IMPLANT
LAVAGE JET IRRISEPT WOUND (IRRIGATION / IRRIGATOR) IMPLANT
MANIFOLD NEPTUNE II (INSTRUMENTS) ×3 IMPLANT
MARKER SKIN DUAL TIP RULER LAB (MISCELLANEOUS) ×3 IMPLANT
NDL SAFETY ECLIPSE 18X1.5 (NEEDLE) ×1 IMPLANT
NDL SPNL 18GX3.5 QUINCKE PK (NEEDLE) ×1 IMPLANT
NEEDLE HYPO 18GX1.5 SHARP (NEEDLE) ×3
NEEDLE SPNL 18GX3.5 QUINCKE PK (NEEDLE) ×3 IMPLANT
PACK ANTERIOR HIP CUSTOM (KITS) ×3 IMPLANT
SAW OSC TIP CART 19.5X105X1.3 (SAW) ×1 IMPLANT
SEALER BIPOLAR AQUA 6.0 (INSTRUMENTS) ×1 IMPLANT
SET HNDPC FAN SPRY TIP SCT (DISPOSABLE) ×1 IMPLANT
SUT ETHIBOND NAB CT1 #1 30IN (SUTURE) ×2 IMPLANT
SUT ETHILON 2 0 PSLX (SUTURE) ×8 IMPLANT
SUT MNCRL AB 3-0 PS2 18 (SUTURE) ×1 IMPLANT
SUT MNCRL AB 4-0 PS2 18 (SUTURE) ×1 IMPLANT
SUT MON AB 2-0 CT1 36 (SUTURE) ×6 IMPLANT
SUT STRATAFIX PDO 1 14 VIOLET (SUTURE) ×3
SUT STRATFX PDO 1 14 VIOLET (SUTURE) ×1
SUT VIC AB 2-0 CT1 27 (SUTURE)
SUT VIC AB 2-0 CT1 TAPERPNT 27 (SUTURE) ×1 IMPLANT
SUTURE STRATFX PDO 1 14 VIOLET (SUTURE) ×1 IMPLANT
SYR 3ML LL SCALE MARK (SYRINGE) ×6 IMPLANT
SYR 50ML LL SCALE MARK (SYRINGE) ×3 IMPLANT
TRAY FOLEY MTR SLVR 16FR STAT (SET/KITS/TRAYS/PACK) IMPLANT
WATER STERILE IRR 1000ML POUR (IV SOLUTION) ×3 IMPLANT
YANKAUER SUCT BULB TIP 10FT TU (MISCELLANEOUS) ×3 IMPLANT

## 2018-06-06 NOTE — Transfer of Care (Signed)
Immediate Anesthesia Transfer of Care Note  Patient: Darlene Crosby  Procedure(s) Performed: LEFT HIP WOUND DEHISCENCE POSSIBLE HEAD AND LINER EXCHANGE (Left )  Patient Location: PACU  Anesthesia Type:General  Level of Consciousness: awake, alert , oriented and patient cooperative  Airway & Oxygen Therapy: Patient Spontanous Breathing and Patient connected to face mask oxygen  Post-op Assessment: Report given to RN and Post -op Vital signs reviewed and stable  Post vital signs: Reviewed and stable  Last Vitals:  Vitals Value Taken Time  BP 139/83 06/06/2018  2:54 PM  Temp    Pulse 91 06/06/2018  2:59 PM  Resp 19 06/06/2018  2:59 PM  SpO2 99 % 06/06/2018  2:59 PM  Vitals shown include unvalidated device data.  Last Pain:  Vitals:   06/06/18 1120  TempSrc:   PainSc: 0-No pain      Patients Stated Pain Goal: 4 (53/69/22 3009)  Complications: No apparent anesthesia complications

## 2018-06-06 NOTE — H&P (Signed)
PREOPERATIVE H&P  Chief Complaint: left hip wound dehiscence  HPI: Darlene Crosby is a 53 y.o. female who presents for preoperative history and physical with a diagnosis of left hip wound dehiscence. She underwent primary left THA on 04/25/18.  She has elected for surgical management.   Past Medical History:  Diagnosis Date  . Asthma    followed by pcp  . Hypertension   . OA (osteoarthritis)    left knee, right shoulder, left hip  . Seasonal allergies   . Wears glasses    Past Surgical History:  Procedure Laterality Date  . COLONOSCOPY  2015  . DIAGNOSTIC LAPAROSCOPY     tubal preg-took ovary and tube  . LAPAROSCOPY FOR ECTOPIC PREGNANCY  1990s   LEFT SALPINGECTOMY AND RIGHT OOPHORECTOMY FOR ABNORMALITY  . MASS EXCISION  02/15/2012   Procedure: EXCISION MASS;  Surgeon: Schuyler Amor, MD;  Location: [facility];  Service: Orthopedics;  Laterality: Right;  Excision of Right Small Volar Mass  . TONSILLECTOMY  age 43  . TOTAL HIP ARTHROPLASTY Left 04/25/2018   Procedure: TOTAL HIP ARTHROPLASTY ANTERIOR APPROACH;  Surgeon: Rod Can, MD;  [facility];  Service: Orthopedics;  Laterality: Left;  . TRIGGER FINGER RELEASE Right 2018   thumb  . WISDOM TOOTH EXTRACTION  age 16   Social History   Socioeconomic History  . Marital status: Widowed    Spouse name: Not on file  . Number of children: Not on file  . Years of education: Not on file  . Highest education level: Not on file  Occupational History  . Not on file  Social Needs  . Financial resource strain: Not on file  . Food insecurity:    Worry: Not on file    Inability: Not on file  . Transportation needs:    Medical: Not on file    Non-medical: Not on file  Tobacco Use  . Smoking status: Never Smoker  . Smokeless tobacco: Never Used  Substance and Sexual Activity  . Alcohol use: No  . Drug use: Never  . Sexual activity: Not on file  Lifestyle  . Physical activity:    Days per week:  Not on file    Minutes per session: Not on file  . Stress: Not on file  Relationships  . Social connections:    Talks on phone: Not on file    Gets together: Not on file    Attends religious service: Not on file    Active member of club or organization: Not on file    Attends meetings of clubs or organizations: Not on file    Relationship status: Not on file  Other Topics Concern  . Not on file  Social History Narrative  . Not on file   Family History  Problem Relation Age of Onset  . Hypertension Mother   . Asthma Mother   . Allergies Mother   . Heart disease Sister   . Hypertension Sister   . Stroke Sister   . Diabetes Sister    Allergies  Allergen Reactions  . Pineapple Shortness Of Breath and Swelling    Swelling of tongue   . Chocolate Hives  . Strawberry Extract Hives  . Wheat Bran Hives  . Coconut Oil Hives and Rash   Prior to Admission medications   Medication Sig Start Date End Date Taking? Authorizing Provider  albuterol (PROAIR HFA) 108 (90 Base) MCG/ACT inhaler Inhale 2 puffs into the lungs every 4 (four) hours  as needed (wheezing and SOB).    Yes [provider]  albuterol (PROVENTIL) (2.5 MG/3ML) 0.083% nebulizer solution Inhale 2.5 mg into the lungs every 4 (four) hours as needed for wheezing or shortness of breath.    Yes [provider]  aspirin 81 MG chewable tablet Chew 1 tablet (81 mg total) by mouth daily. 04/26/18  Yes Margee Trentham, Aaron Edelman, MD  HYDROcodone-acetaminophen (NORCO/VICODIN) 5-325 MG tablet Take 1 tablet by mouth every 4 (four) hours as needed for moderate pain (pain score 4-6). 04/26/18  Yes Kaysen Sefcik, Aaron Edelman, MD  ibuprofen (ADVIL,MOTRIN) 200 MG tablet Take 600 mg by mouth 2 (two) times daily as needed for moderate pain.   Yes [provider]  levocetirizine (XYZAL) 5 MG tablet Take 5 mg by mouth every evening.   Yes [provider]  losartan-hydrochlorothiazide (HYZAAR) 50-12.5 MG tablet Take 1 tablet by mouth  daily.   Yes [provider]  Multiple Vitamin (MULTIVITAMIN) tablet Take 1 tablet by mouth daily.   Yes [provider]  rosuvastatin (CRESTOR) 10 MG tablet Take 10 mg by mouth at bedtime.    Yes [provider]     Positive ROS: All other systems have been reviewed and were otherwise negative with the exception of those mentioned in the HPI and as above.  Physical Exam: General: Alert, no acute distress Cardiovascular: No pedal edema Respiratory: No cyanosis, no use of accessory musculature GI: No organomegaly, abdomen is soft and non-tender Skin: No lesions in the area of chief complaint Neurologic: Sensation intact distally Psychiatric: Patient is competent for consent with normal mood and affect Lymphatic: No axillary or cervical lymphadenopathy  MUSCULOSKELETAL: She has a skin dehiscence in the pannicular fold of about 14 mm. No surrounding erythema.  Assessment: left hip wound dehiscence  Plan: Plan for Procedure(s): LEFT HIP WOUND DEHISCENCE POSSIBLE HEAD AND LINER EXCHANGE  The risks benefits and alternatives were discussed with the patient including but not limited to the risks of nonoperative treatment, versus surgical intervention including infection, bleeding, nerve injury,  blood clots, cardiopulmonary complications, morbidity, mortality, among others, and they were willing to proceed.   Bertram Savin, MD Cell [phone]   06/06/2018 12:38 PM

## 2018-06-06 NOTE — Anesthesia Postprocedure Evaluation (Signed)
Anesthesia Post Note  Patient: Darlene Crosby  Procedure(s) Performed: LEFT HIP WOUND DEHISCENCE POSSIBLE HEAD AND LINER EXCHANGE (Left )     Patient location during evaluation: PACU Anesthesia Type: General Level of consciousness: awake and alert Pain management: pain level controlled Vital Signs Assessment: post-procedure vital signs reviewed and stable Respiratory status: spontaneous breathing, nonlabored ventilation, respiratory function stable and patient connected to nasal cannula oxygen Cardiovascular status: blood pressure returned to baseline and stable Postop Assessment: no apparent nausea or vomiting Anesthetic complications: no    Last Vitals:  Vitals:   06/06/18 1545 06/06/18 1607  BP: 112/83 123/83  Pulse: 86 91  Resp: 18 16  Temp:  36.8 C  SpO2: 96% 97%    Last Pain:  Vitals:   06/06/18 1607  TempSrc: Oral  PainSc:                  Audry Pili

## 2018-06-06 NOTE — Progress Notes (Signed)
Pt transported to room with pink bag, walker and provena box.

## 2018-06-06 NOTE — Anesthesia Procedure Notes (Signed)
Procedure Name: Intubation Date/Time: 06/06/2018 12:56 PM Performed by: West Pugh, CRNA Pre-anesthesia Checklist: Patient identified, Emergency Drugs available, Suction available, Patient being monitored and Timeout performed Patient Re-evaluated:Patient Re-evaluated prior to induction Oxygen Delivery Method: Circle system utilized Preoxygenation: Pre-oxygenation with 100% oxygen Induction Type: IV induction and Cricoid Pressure applied Laryngoscope Size: Mac and 4 Grade View: Grade I Tube type: Oral Tube size: 7.0 mm Number of attempts: 1 Airway Equipment and Method: Stylet Placement Confirmation: ETT inserted through vocal cords under direct vision,  positive ETCO2,  CO2 detector and breath sounds checked- equal and bilateral Secured at: 21 cm Tube secured with: Tape Dental Injury: Teeth and Oropharynx as per pre-operative assessment

## 2018-06-06 NOTE — Op Note (Signed)
OPERATIVE REPORT   06/06/2018  2:42 PM  PATIENT:  Darlene Crosby   SURGEON:  Bertram Savin, MD  ASSISTANT:  Staff.   PREOPERATIVE DIAGNOSIS: Surgical wound dehiscence left hip.  POSTOPERATIVE DIAGNOSIS:  Same.  PROCEDURE: Excisional debridement skin and subcutaneous tissue left hip with primary wound closure. Application of negative pressure incisional dressing, total length of wound 10 cm.  ANESTHESIA:   GETA.  ANTIBIOTICS: 1.5 g vancomycin.  IMPLANTS: None.  SPECIMENS: Left hip superficial wound for culture.  COMPLICATIONS: None.  DISPOSITION: Stable to PACU.  SURGICAL INDICATIONS:  [name] is a 53 y.o. female who underwent primary left total hip replacement approximately 6 weeks ago.  She developed breakdown of the wound in the pannicular fold.  She was indicated for debridement and closure to protect the under lying implants.  The risks, benefits, and alternatives were discussed with the patient preoperatively including but not limited to the risks of infection, bleeding, nerve / blood vessel injury, cardiopulmonary complications, the need for repeat surgery, among others, and the patient was willing to proceed.  PROCEDURE IN DETAIL: The patient was identified in the holding area using 2 identifiers.  The surgical site was marked by myself.  She was taken to the operating room, general anesthesia was induced on the stretcher.  She was then transferred to the [facility] table.  All bony prominences were well-padded.  The left hip was prepped and draped in the normal sterile surgical fashion.  Timeout was called, verifying site and site of surgery.  She did receive IV antibiotics within 60 minutes of beginning the procedure.  Examination of the left hip revealed that her incision is entirely healed except for a 14 mm transverse area directly in the pannicular fold.  There is no periwound erythema.  She does have serous drainage.  Using a #10 blade, I ellipsed the  entire wound out including skin and subcutaneous tissue, creating a total 10 cm wound.  I performed a culture of the remaining wound bed, and this was passed to the back table as a specimen.  I inspected the wound.  No purulent material.  No fluid collection.  I created full-thickness skin flaps to aid with skin mobilization for closure using curved Mayo scissors.  The underlying fascia is intact.  No drainage.  No defects with digital probing or by vision.  I copiously irrigated the wound with Irrisept solution and 3 L of normal saline using pulse lavage.  Through a separate stab incision in line with the incision distally, I placed a flat JP drain within the subcutaneous tissue.  The JP drain was sewn in with 3-0 nylon.  The wound was then closed in a layered fashion with 2-0 Monocryl for the deep dermal layer and 2-0 nylon vertical mattress suture for the skin.  The wound was closed without any undue tension. Prevena incisional wound VAC was placed according to manufacturer's instructions.  Suction was hooked up to 125 mmHg.  There were no leaks.  The patient was then extubated, and taken to the PACU in stable condition.  Sponge, needle, and instrument counts were correct at the end of the case x2.  There were no known complications  POSTOPERATIVE PLAN: Postoperatively, she will be admitted to the hospital.  She may weight-bear as tolerated.  Continue IV vancomycin for now.  Continue aspirin once daily, SCDs, ambulation for DVT prophylaxis.  Follow Intra-Op culture.  Monitor JP drain output.  Plan to discharge home on oral antibiotics within  the next couple of days.  She will return to the office next Thursday for wound VAC removal.

## 2018-06-07 ENCOUNTER — Encounter (HOSPITAL_COMMUNITY): Payer: Self-pay | Admitting: Orthopedic Surgery

## 2018-06-07 MED ORDER — IBUPROFEN 200 MG PO TABS: 600.0000 mg | ORAL_TABLET | Freq: Two times a day (BID) | ORAL | Status: DC | PRN

## 2018-06-07 MED ORDER — DOXYCYCLINE HYCLATE 100 MG PO CAPS: 100.0000 mg | ORAL_CAPSULE | Freq: Two times a day (BID) | ORAL | 0 refills | Status: AC

## 2018-06-07 MED ORDER — ASCORBIC ACID 1000 MG PO TABS: 500.0000 mg | ORAL_TABLET | Freq: Two times a day (BID) | ORAL | 1 refills | Status: DC

## 2018-06-07 NOTE — Plan of Care (Signed)
Plan of care reviewed and discussed with the patient. 

## 2018-06-07 NOTE — Discharge Summary (Signed)
Physician Discharge Summary  Patient ID: Darlene Crosby MRN: [id number] DOB/AGE: 55-Jan-1967 55 y.o.  Admit date: 06/06/2018 Discharge date: 06/07/2018  Admission Diagnoses:  Surgical wound dehiscence  Discharge Diagnoses:  Principal Problem:   Surgical wound dehiscence   Past Medical History:  Diagnosis Date  . Asthma    followed by pcp  . Hypertension   . OA (osteoarthritis)    left knee, right shoulder, left hip  . Seasonal allergies   . Wears glasses     Surgeries: Procedure(s): LEFT HIP WOUND DEHISCENCE POSSIBLE HEAD AND LINER EXCHANGE on 06/06/2018   Consultants (if any):   Discharged Condition: Improved  Hospital Course: Darlene Crosby is an 53 y.o. female who was admitted 06/06/2018 with a diagnosis of Surgical wound dehiscence and went to the operating room on 06/06/2018 and underwent the above named procedures.    She was given perioperative antibiotics:  Anti-infectives (From admission, onward)   Start     Dose/Rate Route Frequency Ordered Stop   06/07/18 0000  doxycycline (VIBRAMYCIN) 100 MG capsule     100 mg Oral 2 times daily 06/07/18 1130 06/21/18 2359   06/06/18 2300  vancomycin (VANCOCIN) 1,000 mg in sodium chloride 0.9 % 250 mL IVPB     1,000 mg 250 mL/hr over 60 Minutes Intravenous Every 12 hours 06/06/18 1601 06/08/18 1059   06/06/18 1115  vancomycin (VANCOCIN) 1,500 mg in sodium chloride 0.9 % 500 mL IVPB  Status:  Discontinued     1,500 mg 250 mL/hr over 120 Minutes Intravenous On call to O.R. 06/06/18 1107 06/06/18 1113   06/06/18 0600  vancomycin (VANCOCIN) 1,500 mg in sodium chloride 0.9 % 500 mL IVPB     1,500 mg 250 mL/hr over 120 Minutes Intravenous 60 min pre-op 06/05/18 1144 06/06/18 1357    .  She was given sequential compression devices, early ambulation, and ASA for DVT prophylaxis.  Patient with Prevena incisional VAC and JP drain.   She benefited maximally from the hospital stay and there were no complications.    Recent vital  signs:  Vitals:   06/07/18 0638 06/07/18 0938  BP: 103/66 107/69  Pulse: 74 86  Resp: 16 18  Temp: (!) 97.5 F (36.4 C) (!) 97.5 F (36.4 C)  SpO2: 94% 98%    Recent laboratory studies:  Lab Results  Component Value Date   HGB 13.0 06/06/2018   HGB 11.1 (L) 04/26/2018   HGB 13.9 04/18/2018   Lab Results  Component Value Date   WBC 7.6 06/06/2018   PLT 334 06/06/2018   No results found for: INR Lab Results  Component Value Date   NA 139 06/06/2018   K 3.0 (L) 06/06/2018   CL 105 06/06/2018   CO2 25 06/06/2018   BUN 15 06/06/2018   CREATININE 0.78 06/06/2018   GLUCOSE 107 (H) 06/06/2018     Recent Results (from the past 240 hour(s))  Aerobic/Anaerobic Culture (surgical/deep wound)     Status: None (Preliminary result)   Collection Time: 06/06/18  1:24 PM  Result Value Ref Range Status   Specimen Description WOUND HIP LEFT  Final   Special Requests SUPERFICIAL  Final   Gram Stain   Final    RARE WBC PRESENT, PREDOMINANTLY MONONUCLEAR NO ORGANISMS SEEN    Culture   Final    NO GROWTH < 24 HOURS Performed at [facility], [place] [address], [place], [place] [address]    Report Status PENDING  Incomplete  Discharge Medications:   Allergies as of 06/07/2018      Reactions   Pineapple Shortness Of Breath, Swelling   Swelling of tongue    Chocolate Hives   Strawberry Extract Hives   Wheat Bran Hives   Coconut Oil Hives, Rash      Medication List    TAKE these medications   albuterol (2.5 MG/3ML) 0.083% nebulizer solution Commonly known as:  PROVENTIL Inhale 2.5 mg into the lungs every 4 (four) hours as needed for wheezing or shortness of breath.   ProAir HFA 108 (90 Base) MCG/ACT inhaler Generic drug:  albuterol Inhale 2 puffs into the lungs every 4 (four) hours as needed (wheezing and SOB).   ascorbic acid 1000 MG tablet Commonly known as:  VITAMIN C Take 0.5 tablets (500 mg total) by mouth 2 (two) times daily.   aspirin 81 MG chewable  tablet Chew 1 tablet (81 mg total) by mouth daily.   doxycycline 100 MG capsule Commonly known as:  VIBRAMYCIN Take 1 capsule (100 mg total) by mouth 2 (two) times daily for 14 days.   HYDROcodone-acetaminophen 5-325 MG tablet Commonly known as:  NORCO/VICODIN Take 1 tablet by mouth every 4 (four) hours as needed for moderate pain (pain score 4-6).   ibuprofen 200 MG tablet Commonly known as:  ADVIL,MOTRIN Take 600 mg by mouth 2 (two) times daily as needed for moderate pain.   levocetirizine 5 MG tablet Commonly known as:  XYZAL Take 5 mg by mouth every evening.   losartan-hydrochlorothiazide 50-12.5 MG tablet Commonly known as:  HYZAAR Take 1 tablet by mouth daily.   multivitamin tablet Take 1 tablet by mouth daily.   rosuvastatin 10 MG tablet Commonly known as:  CRESTOR Take 10 mg by mouth at bedtime.       Diagnostic Studies: No results found.  Disposition: Discharge disposition: 01-Home or Self Care       Discharge Instructions    Call MD / Call 911   Complete by:  As directed    If you experience chest pain or shortness of breath, CALL 911 and be transported to the hospital emergency room.  If you develope a fever above 101 F, pus (white drainage) or increased drainage or redness at the wound, or calf pain, call your surgeon's office.   Constipation Prevention   Complete by:  As directed    Drink plenty of fluids.  Prune juice may be helpful.  You may use a stool softener, such as Colace (over the counter) 100 mg twice a day.  Use MiraLax (over the counter) for constipation as needed.   Diet - low sodium heart healthy   Complete by:  As directed    Discharge instructions   Complete by:  As directed    Keep VAC dressing clean and dry. Do not remove. Charge VAC unit nightly. Every 8 hours: Empty JP drain, record output in a log book, and recharge.  Bring log book to follow up appointment. Call [phone] ASAP to schedule a follow up appointment in 7 days.    Increase activity slowly as tolerated   Complete by:  As directed       Follow-up Information    Anfernee Peschke, Aaron Edelman, MD. Schedule an appointment as soon as possible for a visit on 06/14/2018.   Specialty:  Orthopedic Surgery Why:  For wound re-check, VAC removal Contact information: [address] [place] [address] [phone]            Signed:  Hilton Cork Avanthika Dehnert 06/07/2018, 11:30 AM

## 2018-06-07 NOTE — Progress Notes (Signed)
   mild to moderateSubjective:  Patient reports pain as mild to moderate.  Denies N/V/CP/SOB. No c/o.  Objective:   VITALS:   Vitals:   06/06/18 2118 06/07/18 0149 06/07/18 0638 06/07/18 0938  BP: (!) 148/90 128/74 103/66 107/69  Pulse: 81 70 74 86  Resp: 16 16 16 18   Temp: (!) 97.3 F (36.3 C) (!) 97.5 F (36.4 C) (!) 97.5 F (36.4 C) (!) 97.5 F (36.4 C)  TempSrc: Oral Oral Oral Oral  SpO2: 100% 95% 94% 98%  Weight:      Height:        NAD ABD soft Sensation intact distally Intact pulses distally Dorsiflexion/Plantar flexion intact Incision: dressing C/D/I Compartment soft JP ss VAC intact  Lab Results  Component Value Date   WBC 7.6 06/06/2018   HGB 13.0 06/06/2018   HCT 39.6 06/06/2018   MCV 86.1 06/06/2018   PLT 334 06/06/2018   BMET    Component Value Date/Time   NA 139 06/06/2018 1121   K 3.0 (L) 06/06/2018 1121   CL 105 06/06/2018 1121   CO2 25 06/06/2018 1121   GLUCOSE 107 (H) 06/06/2018 1121   BUN 15 06/06/2018 1121   CREATININE 0.78 06/06/2018 1121   CREATININE 0.96 12/27/2014 1022   CALCIUM 9.0 06/06/2018 1121   GFRNONAA >60 06/06/2018 1121   GFRAA >60 06/06/2018 1121     Recent Results (from the past 240 hour(s))  Aerobic/Anaerobic Culture (surgical/deep wound)     Status: None (Preliminary result)   Collection Time: 06/06/18  1:24 PM  Result Value Ref Range Status   Specimen Description WOUND HIP LEFT  Final   Special Requests SUPERFICIAL  Final   Gram Stain   Final    RARE WBC PRESENT, PREDOMINANTLY MONONUCLEAR NO ORGANISMS SEEN    Culture   Final    NO GROWTH < 24 HOURS Performed at [facility], [address] [place], [place] [address]    Report Status PENDING  Incomplete     Assessment/Plan: 1 Day Post-Op   Principal Problem:   Surgical wound dehiscence   WBAT with walker DVT ppx: Aspirin, SCDs, TEDS PO pain control PT/OT Dispo: D/C home today with JP drain, convert VAC to portable Prevena unit upon d/c,  will Rx PO abx    Hilton Cork Cathalina Barcia 06/07/2018, 11:24 AM   Rod Can, MD Cell: [phone] [place] [facility]  [place] [address], [place], [place], [place] [address] Phone: [phone] [email or web address] Facebook  [employer]

## 2018-06-11 LAB — AEROBIC/ANAEROBIC CULTURE (SURGICAL/DEEP WOUND): Culture: NO GROWTH

## 2018-06-11 LAB — AEROBIC/ANAEROBIC CULTURE W GRAM STAIN (SURGICAL/DEEP WOUND)

## 2018-07-02 ENCOUNTER — Telehealth: Payer: Self-pay | Admitting: Internal Medicine

## 2018-07-02 NOTE — Telephone Encounter (Signed)
Pt has not been seen at office since 03/26/2013.  Called and spoke with pt. Pt stated that her work is needing a letter to be written stating that she does have asthma and if at all possible, it needs to be specified the severity of the asthma.  Dr. Melvyn Novas, please advise if you are okay writing the letter for pt's work even though she has not been seen at office in 5 years and if you are okay doing so, please advise what we need to state in the letter. Thanks!

## 2018-07-02 NOTE — Telephone Encounter (Signed)
We were not able to prove she had asthma at last ov and suggested cpst to sort out  "If not better in a month, call to schedule a cpst - [phone] and ask for [place]"  Therefore all we can say in letter  I saw her for sob  Last on 03/26/2013 and did not complete the w/u but have not seen her since, so I can't comment further on her present healthcare issues.  However, please tell pt happy to see again and try to sort out her symptoms if she has ongoing concerns

## 2018-07-02 NOTE — Telephone Encounter (Signed)
Called and spoke with patient regarding MW recommendations below Advised patient that if she is no better in a month to call cpst [phone] Pt advised nothing further is needed at this time

## 2018-10-01 DIAGNOSIS — M5136 Other intervertebral disc degeneration, lumbar region: Secondary | ICD-10-CM | POA: Insufficient documentation

## 2018-10-25 DIAGNOSIS — M47816 Spondylosis without myelopathy or radiculopathy, lumbar region: Secondary | ICD-10-CM | POA: Insufficient documentation

## 2019-02-08 DIAGNOSIS — E785 Hyperlipidemia, unspecified: Secondary | ICD-10-CM | POA: Insufficient documentation

## 2019-03-14 DIAGNOSIS — Z6837 Body mass index (BMI) 37.0-37.9, adult: Secondary | ICD-10-CM | POA: Insufficient documentation

## 2019-03-14 DIAGNOSIS — M48061 Spinal stenosis, lumbar region without neurogenic claudication: Secondary | ICD-10-CM | POA: Insufficient documentation

## 2019-03-21 ENCOUNTER — Other Ambulatory Visit: Payer: Self-pay | Admitting: Neurological Surgery

## 2019-03-21 ENCOUNTER — Telehealth: Payer: Self-pay

## 2019-03-21 DIAGNOSIS — M48061 Spinal stenosis, lumbar region without neurogenic claudication: Secondary | ICD-10-CM

## 2019-03-21 NOTE — Telephone Encounter (Signed)
Patient returned my call for me to screen her medications and drug allergies before being scheduled for a lumbar myelogram.  She was informed she will be here for two hours, will need a driver and will need to be on strict bedrest for 24 hours after the procedure.  She has no medications to hold for the myelogram.

## 2019-03-28 ENCOUNTER — Ambulatory Visit
Admission: RE | Admit: 2019-03-28 | Discharge: 2019-03-28 | Disposition: A | Discharge status: Home / Self Care | Payer: BC Managed Care – PPO | Source: Ambulatory Visit | Attending: Neurological Surgery | Admitting: Neurological Surgery

## 2019-03-28 VITALS — BP 174/115 | HR 88

## 2019-03-28 DIAGNOSIS — M47816 Spondylosis without myelopathy or radiculopathy, lumbar region: Secondary | ICD-10-CM

## 2019-03-28 DIAGNOSIS — M5136 Other intervertebral disc degeneration, lumbar region: Secondary | ICD-10-CM

## 2019-03-28 DIAGNOSIS — M48061 Spinal stenosis, lumbar region without neurogenic claudication: Secondary | ICD-10-CM

## 2019-03-28 MED ORDER — DIAZEPAM 5 MG PO TABS
5.0000 mg | ORAL_TABLET | Freq: Once | ORAL | Status: AC
  Administered 2019-03-28: 5 mg via ORAL

## 2019-03-28 MED ORDER — IOPAMIDOL (ISOVUE-M 200) INJECTION 41%
18.0000 mL | Freq: Once | INTRAMUSCULAR | Status: AC
  Administered 2019-03-28: 18 mL via INTRATHECAL

## 2019-03-28 NOTE — Discharge Instructions (Signed)

## 2019-12-13 IMAGING — DX DG PORTABLE PELVIS
1 series · 1 of 1 positions shown · non-contrast
Comparison: None.

CLINICAL DATA: Left hip replacement

EXAM:
PORTABLE PELVIS 1-2 VIEWS; OPERATIVE LEFT HIP WITH PELVIS

[pelvis ap]
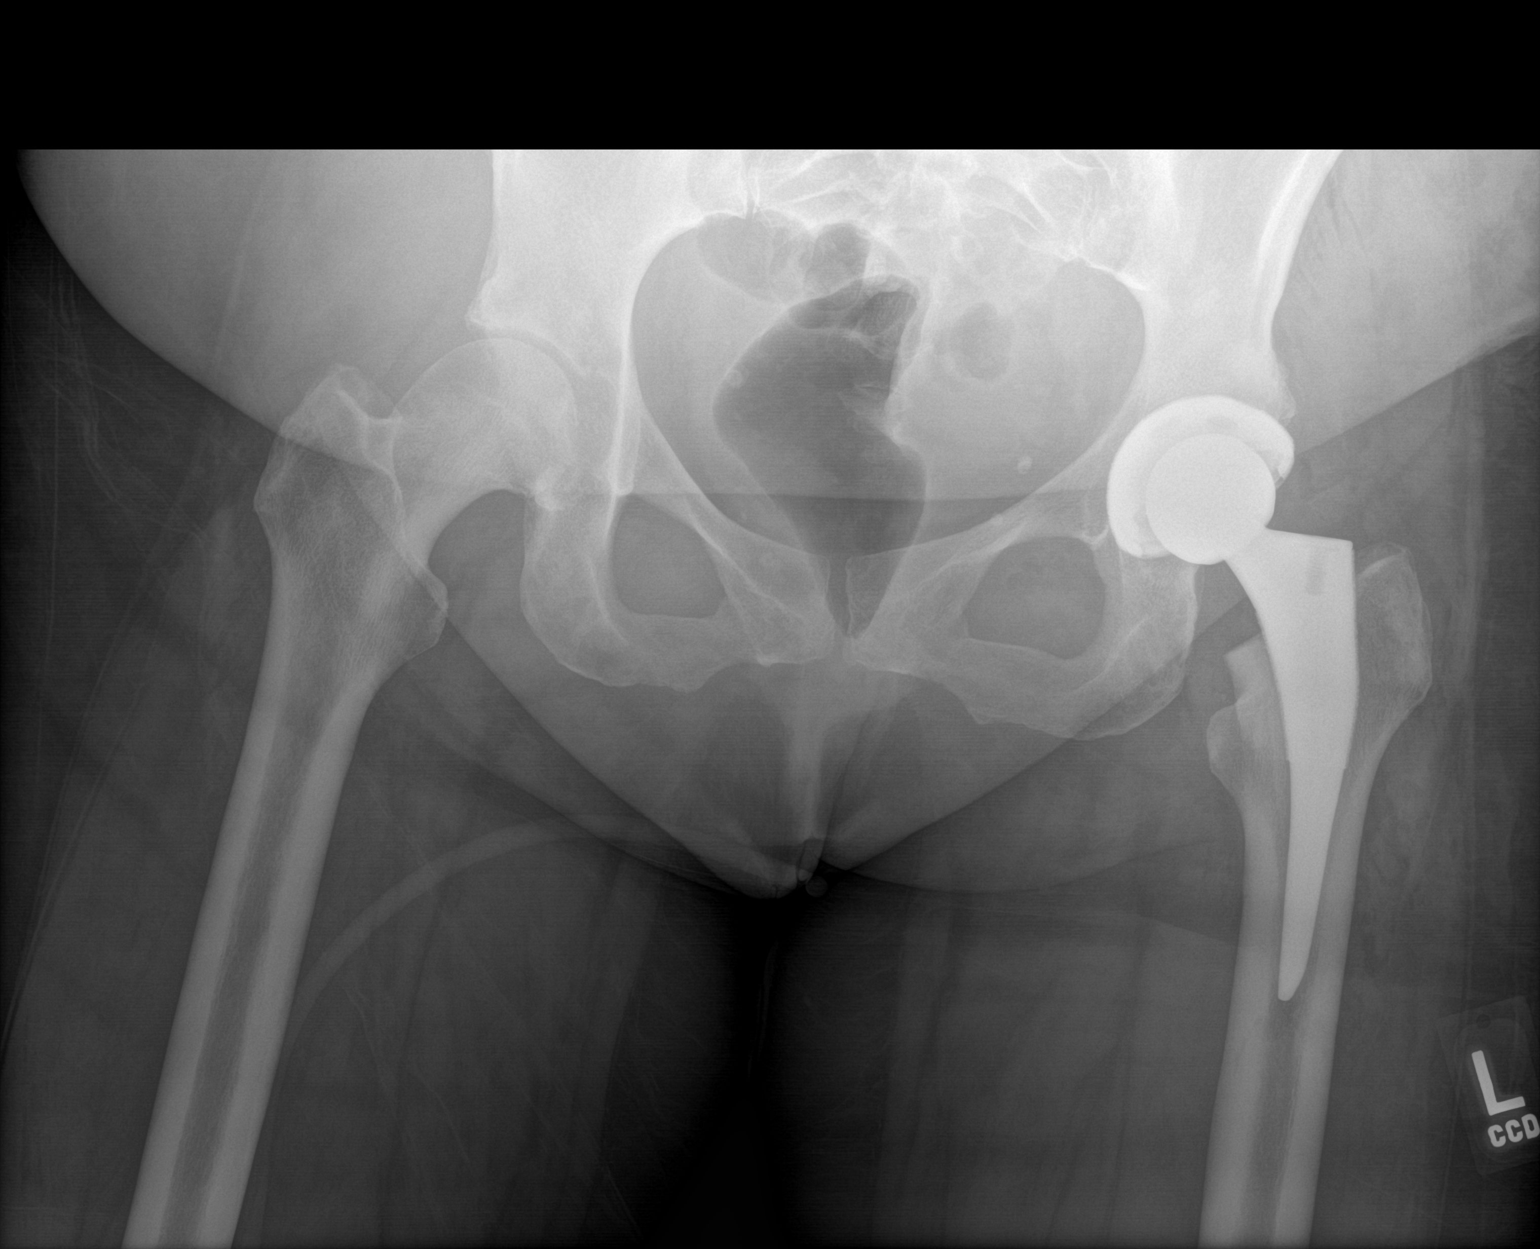

[1 of 1 positions shown; findings below may reference images not displayed]

FINDINGS: Left hip replacement in satisfactory position alignment. No acute
complication.
IMPRESSION: Satisfactory left hip replacement

## 2020-03-20 ENCOUNTER — Ambulatory Visit: Payer: BC Managed Care – PPO | Admitting: Orthopaedic Surgery

## 2020-03-27 ENCOUNTER — Encounter: Payer: Self-pay | Admitting: Orthopaedic Surgery

## 2020-03-27 ENCOUNTER — Ambulatory Visit: Payer: 59 | Admitting: Orthopaedic Surgery

## 2020-03-27 VITALS — BP 173/106 | HR 80 | Ht 65.0 in | Wt 210.0 lb

## 2020-03-27 DIAGNOSIS — M5136 Other intervertebral disc degeneration, lumbar region: Secondary | ICD-10-CM

## 2020-03-27 DIAGNOSIS — M47816 Spondylosis without myelopathy or radiculopathy, lumbar region: Secondary | ICD-10-CM | POA: Diagnosis not present

## 2020-03-30 NOTE — Progress Notes (Signed)
Office Visit Note   Patient: Darlene Crosby           Date of Birth: 13-Feb-1966           MRN: [id number] Visit Date: 03/27/2020              Requested by: No referring provider defined for this encounter. PCP: Hemelt, Jeronimo Greaves, CRNA (Inactive)   Assessment & Plan: Visit Diagnoses: No diagnosis found.  Plan: I reviewed the imaging studies with patient and her findings on physical exam.  No surgery recommended.  She needs to work on core strengthening and weight loss which may help unload her back and give her some improvement in her symptoms.  She does have some mild narrowing but I discussed with her I agree with Dr. Tonita Cong that no surgery is recommended.  She can work on some core strengthening possible water aerobics program .  Follow-Up Instructions: Return if symptoms worsen or fail to improve.   Orders:  No orders of the defined types were placed in this encounter.  No orders of the defined types were placed in this encounter.     Procedures: No procedures performed   Clinical Data: No additional findings.   Subjective: Chief Complaint  Patient presents with  . Lower Back - Pain    HPI 55 year old female with chronic back pain pain greater than a year and a half.  Patient seen Dr. Tonita Cong in the past and also Dr. Herma Mering.  She has had 6 injections so far.  Therapy did not help.  She states she has had discussion about the possible spinal cord stimulator.  Patient states that she was told surgery was not recommended.  Patient's been on light duty she has discomfort when she stands for period of time.  She has been wearing a back brace.  She has increased discomfort with palliative bending.  When she sits she puts pillows behind her back.  She is at hip replacement by Dr. Lyla Glassing which is doing well.  She is using Flexeril.  Not on any narcotics currently.  73-year-old lumbar myelogram CT scan is available for review as well as MRI scans on disc.  Previous total of  arthroplasty with repeat debridement and wound closure proximal portion with some dehiscence underneath her pannicular fold.  This is completely healed.  Review of Systems all other systems noncontributory to HPI.   Objective: Vital Signs: BP (!) 173/106   Pulse 80   Ht 5\' 5"  (1.651 m)   Wt 210 lb (95.3 kg)   LMP 05/04/2012   BMI 34.95 kg/m   Physical Exam Constitutional:      Appearance: She is well-developed.  HENT:     Head: Normocephalic.     Right Ear: External ear normal.     Left Ear: External ear normal.  Eyes:     Pupils: Pupils are equal, round, and reactive to light.  Neck:     Thyroid: No thyromegaly.     Trachea: No tracheal deviation.  Cardiovascular:     Rate and Rhythm: Normal rate.  Pulmonary:     Effort: Pulmonary effort is normal.  Abdominal:     Palpations: Abdomen is soft.  Skin:    General: Skin is warm and dry.  Neurological:     Mental Status: She is alert and oriented to person, place, and time.  Psychiatric:        Mood and Affect: Mood and affect normal.  Behavior: Behavior normal.     Ortho Exam patient can heel and toe walk negative straight leg raising 90 degrees negative logroll right left hip well-healed hip incision.  Knees reach full extension.  Anterior tib EHL gastrocsoleus is intact.  Specialty Comments:  No specialty comments available.  Imaging: CLINICAL DATA: Chronic central low back pain radiating upwards into the right shoulder. No prior surgery.  EXAM: LUMBAR MYELOGRAM  CT LUMBAR MYELOGRAM  FLUOROSCOPY TIME: Radiation Exposure Index (as provided by the fluoroscopic device): 29.5 mGy  Fluoroscopy Time: 1 minute, 18 seconds  Number of Acquired Images: 16  PROCEDURE: After thorough discussion of risks and benefits of the procedure including bleeding, infection, injury to nerves, blood vessels, adjacent structures as well as headache and CSF leak, written and oral informed consent was obtained. Consent was  obtained by Dr. Fabiola Backer. Time out form was completed.  Patient was positioned prone on the fluoroscopy table. Local anesthesia was provided with 1% lidocaine without epinephrine after prepped and draped in the usual sterile fashion. Puncture was performed at L2-L3 using a 5 inch 22-gauge spinal needle via right interlaminar approach. Using a single pass through the dura, the needle was placed within the thecal sac, with return of clear CSF. 15 mL of Isovue M-200 was injected into the thecal sac, with normal opacification of the nerve roots and cauda equina consistent with free flow within the subarachnoid space.  I personally performed the lumbar puncture and administered the intrathecal contrast. I also personally supervised acquisition of the myelogram images.  TECHNIQUE: Contiguous axial images were obtained through the lumbar spine after the intrathecal infusion of contrast. Coronal and sagittal reconstructions were obtained of the axial image sets.  COMPARISON: Outside MRI lumbar spine dated February 18, 2019.  FINDINGS: LUMBAR MYELOGRAM FINDINGS:  Trace retrolisthesis at L1-L2. No dynamic instability. Small ventral extradural defects from L1-L2 through L4-L5. These slightly worsened with extension. Mild spinal canal stenosis at L2-L3 and L3-L4. No nerve root effacement.  CT LUMBAR MYELOGRAM FINDINGS:  Segmentation: Standard.  Alignment: Unchanged trace retrolisthesis at L1-L2.  Vertebrae: No acute fracture or other focal pathologic process.  Conus medullaris and cauda equina: Conus extends to the L1 level. Conus and cauda equina appear normal.  Paraspinal and other soft tissues: Negative. Incidental note is made of a retroaortic left renal vein.  Disc levels:  T12-L1: Unchanged mild disc bulging. No stenosis.  L1-L2: Unchanged mild disc bulging and bilateral facet arthropathy. Unchanged mild right lateral recess stenosis. Unchanged moderate right and  mild left neuroforaminal stenosis. No spinal canal stenosis.  L2-L3: Unchanged mild disc bulging and mild bilateral facet arthropathy. Unchanged mild spinal canal stenosis and borderline mild bilateral neuroforaminal stenosis.  L3-L4: Unchanged mild disc bulging eccentric to the right. Unchanged mild bilateral facet arthropathy. Unchanged mild to moderate spinal canal stenosis and moderate bilateral neuroforaminal stenosis.  L4-L5: Unchanged mild disc bulging and mild bilateral facet arthropathy. Unchanged moderate bilateral neuroforaminal stenosis. No spinal canal stenosis.  L5-S1: Negative disc. Unchanged mild bilateral facet arthropathy. No stenosis.  IMPRESSION: 1. Multilevel lumbar spondylosis as described above. Mild L2-L3 and mild to moderate L3-L4 spinal canal stenosis. 2. Moderate bilateral neuroforaminal stenosis at L3-L4 and L4-L5. 3. Disc bulging slightly worsens with extension.   Electronically Signed By: Titus Dubin M.D. On: 03/28/2019 12:27   PMFS History: Patient Active Problem List   Diagnosis Date Noted  . Hypercholesterolemia 02/08/2019  . Lumbar spondylosis 10/25/2018  . Degeneration of lumbar intervertebral disc 10/01/2018  . Surgical  wound dehiscence 06/06/2018  . Osteoarthritis of left hip 04/25/2018  . Spinal stenosis of lumbar region 02/23/2018  . Trigger thumb of right hand 03/30/2017  . Dyspnea 03/26/2013  . 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 HTN (hypertension) 06/07/2012  . Pyogenic granuloma 02/15/2012   Past Medical History:  Diagnosis Date  . Asthma    followed by pcp  . Hypertension   . OA (osteoarthritis)    left knee, right shoulder, left hip  . Seasonal allergies   . Wears glasses     Family History  Problem Relation Age of Onset  . Hypertension Mother   . Asthma Mother   . Allergies Mother   . Heart disease Sister   . Hypertension Sister   . Stroke Sister   . Diabetes Sister     Past Surgical History:  Procedure  Laterality Date  . ANTERIOR HIP REVISION Left 06/06/2018   Procedure: LEFT HIP WOUND DEHISCENCE POSSIBLE HEAD AND LINER EXCHANGE;  Surgeon: Rod Can, MD;  Location: [facility];  Service: Orthopedics;  Laterality: Left;  . COLONOSCOPY  2015  . DIAGNOSTIC LAPAROSCOPY     tubal preg-took ovary and tube  . LAPAROSCOPY FOR ECTOPIC PREGNANCY  1990s   LEFT SALPINGECTOMY AND RIGHT OOPHORECTOMY FOR ABNORMALITY  . MASS EXCISION  02/15/2012   Procedure: EXCISION MASS;  Surgeon: Schuyler Amor, MD;  Location: [facility];  Service: Orthopedics;  Laterality: Right;  Excision of Right Small Volar Mass  . TONSILLECTOMY  age 29  . TOTAL HIP ARTHROPLASTY Left 04/25/2018   Procedure: TOTAL HIP ARTHROPLASTY ANTERIOR APPROACH;  Surgeon: Rod Can, MD;  Location: [facility];  Service: Orthopedics;  Laterality: Left;  . TRIGGER FINGER RELEASE Right 2018   thumb  . WISDOM TOOTH EXTRACTION  age 9   Social History   Occupational History  . Not on file  Tobacco Use  . Smoking status: Never Smoker  . Smokeless tobacco: Never Used  Vaping Use  . Vaping Use: Never used  Substance and Sexual Activity  . Alcohol use: No  . Drug use: Never  . Sexual activity: Not on file

## 2020-05-01 ENCOUNTER — Ambulatory Visit
Admission: EM | Admit: 2020-05-01 | Discharge: 2020-05-01 | Disposition: A | Discharge status: Home / Self Care | Payer: 59 | Attending: Urgent Care | Admitting: Urgent Care

## 2020-05-01 DIAGNOSIS — M545 Low back pain, unspecified: Secondary | ICD-10-CM | POA: Diagnosis not present

## 2020-05-01 DIAGNOSIS — I10 Essential (primary) hypertension: Secondary | ICD-10-CM

## 2020-05-01 DIAGNOSIS — G8929 Other chronic pain: Secondary | ICD-10-CM

## 2020-05-01 DIAGNOSIS — M47816 Spondylosis without myelopathy or radiculopathy, lumbar region: Secondary | ICD-10-CM

## 2020-05-01 DIAGNOSIS — M48061 Spinal stenosis, lumbar region without neurogenic claudication: Secondary | ICD-10-CM

## 2020-05-01 MED ORDER — TRAMADOL HCL 50 MG PO TABS: 50.0000 mg | ORAL_TABLET | Freq: Four times a day (QID) | ORAL | 0 refills | Status: DC | PRN

## 2020-05-01 MED ORDER — TIZANIDINE HCL 4 MG PO TABS: 4.0000 mg | ORAL_TABLET | Freq: Three times a day (TID) | ORAL | 0 refills | Status: DC | PRN

## 2020-05-01 MED ORDER — AMLODIPINE BESYLATE 10 MG PO TABS: 10.0000 mg | ORAL_TABLET | Freq: Every day | ORAL | 0 refills | Status: DC

## 2020-05-01 NOTE — Discharge Instructions (Addendum)
Please make sure you take your amlodipine and follow up with your new regular doctor as soon possible.   Do not use any nonsteroidal anti-inflammatories (NSAIDs) like ibuprofen, Motrin, naproxen, Aleve, etc. which are all available over-the-counter.  Please just use Tylenol at a dose of 500mg -650mg  once every 6 hours as needed for your aches, pains, fevers. You can use tramadol for severe pain. Do not use it if you do not need it.

## 2020-05-01 NOTE — ED Triage Notes (Signed)
Pt c/o lower and upper back pain x1wk. States hx of lower back pain and wearing a back brace. States hasn't been on her b/p meds for over week.

## 2020-05-01 NOTE — ED Provider Notes (Signed)
[place]   MRN: [id number] DOB: 07-20-1965  Subjective:   Darlene Crosby is a 55 y.o. female presenting for 1 week history of acute on chronic lower back pain.  Patient has a history of lumbar spondylosis, spinal stenosis of the lumbar region.  She does have a spine specialist that is supposed to get a spinal cord stimulator but has not gone through with this.  Denies any new falls, trauma, weakness, numbness or tingling, radiculopathy.  She also has a history of left hip osteoarthritis and lumbar degenerative disc disease.  Patient does admit that she recently spent a lot of time shoveling at her mother's house.  Regarding her blood pressure she is supposed to be on amlodipine 5 mg but did not take it today.  Denies headache, confusion, chest pain, belly pain, hematuria, weakness, numbness or tingling.  No current facility-administered medications for this encounter.  Current Outpatient Medications:  .  albuterol (PROVENTIL) (2.5 MG/3ML) 0.083% nebulizer solution, Inhale 2.5 mg into the lungs every 4 (four) hours as needed for wheezing or shortness of breath. , Disp: , Rfl:  .  albuterol (VENTOLIN HFA) 108 (90 Base) MCG/ACT inhaler, Inhale 2 puffs into the lungs every 4 (four) hours as needed (wheezing and SOB). , Disp: , Rfl:  .  cyclobenzaprine (FLEXERIL) 10 MG tablet, Take 10 mg by mouth daily as needed., Disp: , Rfl:  .  ibuprofen (ADVIL,MOTRIN) 200 MG tablet, Take 600 mg by mouth 2 (two) times daily as needed for moderate pain., Disp: , Rfl:  .  levocetirizine (XYZAL) 5 MG tablet, Take 5 mg by mouth every evening., Disp: , Rfl:  .  losartan-hydrochlorothiazide (HYZAAR) 50-12.5 MG tablet, Take 1 tablet by mouth daily., Disp: , Rfl:  .  Multiple Vitamin (MULTIVITAMIN) tablet, Take 1 tablet by mouth daily., Disp: , Rfl:  .  rosuvastatin (CRESTOR) 10 MG tablet, Take 10 mg by mouth at bedtime. , Disp: , Rfl:  .  vitamin C (VITAMIN C) 1000 MG tablet, Take 0.5 tablets (500 mg  total) by mouth 2 (two) times daily., Disp: 60 tablet, Rfl: 1 .  Vitamin D, Ergocalciferol, (DRISDOL) 1.25 MG (50000 UNIT) CAPS capsule, Take 50,000 Units by mouth once a week., Disp: , Rfl:    Allergies  Allergen Reactions  . Pineapple Shortness Of Breath and Swelling    Swelling of tongue   . Chocolate Hives  . Coconut Oil Hives and Rash  . Strawberry Extract Hives  . Wheat Bran Hives    Past Medical History:  Diagnosis Date  . Asthma    followed by pcp  . Hypertension   . OA (osteoarthritis)    left knee, right shoulder, left hip  . Seasonal allergies   . Wears glasses      Past Surgical History:  Procedure Laterality Date  . ANTERIOR HIP REVISION Left 06/06/2018   Procedure: LEFT HIP WOUND DEHISCENCE POSSIBLE HEAD AND LINER EXCHANGE;  Surgeon: Rod Can, MD;  Location: [facility];  Service: Orthopedics;  Laterality: Left;  . COLONOSCOPY  2015  . DIAGNOSTIC LAPAROSCOPY     tubal preg-took ovary and tube  . LAPAROSCOPY FOR ECTOPIC PREGNANCY  1990s   LEFT SALPINGECTOMY AND RIGHT OOPHORECTOMY FOR ABNORMALITY  . MASS EXCISION  02/15/2012   Procedure: EXCISION MASS;  Surgeon: Schuyler Amor, MD;  Location: [facility];  Service: Orthopedics;  Laterality: Right;  Excision of Right Small Volar Mass  . TONSILLECTOMY  age 69  . TOTAL HIP ARTHROPLASTY  Left 04/25/2018   Procedure: TOTAL HIP ARTHROPLASTY ANTERIOR APPROACH;  Surgeon: Rod Can, MD;  Location: [facility];  Service: Orthopedics;  Laterality: Left;  . TRIGGER FINGER RELEASE Right 2018   thumb  . WISDOM TOOTH EXTRACTION  age 53    Family History  Problem Relation Age of Onset  . Hypertension Mother   . Asthma Mother   . Allergies Mother   . Heart disease Sister   . Hypertension Sister   . Stroke Sister   . Diabetes Sister     Social History   Tobacco Use  . Smoking status: Never Smoker  . Smokeless tobacco: Never Used  Vaping Use  . Vaping Use: Never used  Substance Use Topics  .  Alcohol use: No  . Drug use: Never    ROS   Objective:   Vitals: BP (!) 200/110 (BP Location: Right Arm)   Pulse 83   Temp 97.9 F (36.6 C) (Oral)   Resp 18   LMP 05/04/2012   SpO2 95%   BP Readings from Last 3 Encounters:  05/01/20 (!) 200/110  03/27/20 (!) 173/106  03/28/19 (!) 174/115   Physical Exam Constitutional:      General: She is not in acute distress.    Appearance: Normal appearance. She is well-developed. She is not ill-appearing, toxic-appearing or diaphoretic.  HENT:     Head: Normocephalic and atraumatic.     Nose: Nose normal.     Mouth/Throat:     Mouth: Mucous membranes are moist.     Pharynx: Oropharynx is clear.  Eyes:     General: No scleral icterus.       Right eye: No discharge.        Left eye: No discharge.     Extraocular Movements: Extraocular movements intact.     Conjunctiva/sclera: Conjunctivae normal.     Pupils: Pupils are equal, round, and reactive to light.  Cardiovascular:     Rate and Rhythm: Normal rate.  Pulmonary:     Effort: Pulmonary effort is normal.  Musculoskeletal:     Comments: Full range of motion throughout.  Strength 5/5 for upper and lower extremities.  Patient ambulates without any assistance at expected pace.  No ecchymosis, swelling, lacerations or abrasions.  Patient does have paraspinal muscle tenderness along the lumbar region of her back excluding the midline.   Skin:    General: Skin is warm and dry.  Neurological:     General: No focal deficit present.     Mental Status: She is alert and oriented to person, place, and time.     Cranial Nerves: No cranial nerve deficit.     Motor: No weakness.     Coordination: Coordination normal.     Gait: Gait normal.     Deep Tendon Reflexes: Reflexes normal.  Psychiatric:        Mood and Affect: Mood normal.        Behavior: Behavior normal.        Thought Content: Thought content normal.        Judgment: Judgment normal.     Assessment and Plan :   I  have reviewed the PDMP during this encounter.  1. Acute exacerbation of chronic low back pain   2. Lumbar spondylosis   3. Neuroforaminal stenosis of lumbar spine   4. Essential hypertension     Unfortunately due to her uncontrolled hypertension will avoid prednisone use.  Recommended increasing her amlodipine to 10 mg, strict hypertensive friendly diet.  Follow-up with PCP.  Today for back wound use Tylenol, tizanidine and tramadol for breakthrough pain.  Follow-up with spine specialist as soon as possible.  Patient does not have symptoms consistent with stroke, ACS and therefore will defer an ER visit. Counseled patient on potential for adverse effects with medications prescribed/recommended today, ER and return-to-clinic precautions discussed, patient verbalized understanding.    Jaynee Eagles, [place] 05/01/20 1844

## 2020-05-18 ENCOUNTER — Ambulatory Visit (INDEPENDENT_AMBULATORY_CARE_PROVIDER_SITE_OTHER): Payer: 59 | Admitting: Family

## 2020-05-18 ENCOUNTER — Encounter: Payer: Self-pay | Admitting: Family

## 2020-05-18 ENCOUNTER — Telehealth: Payer: Self-pay | Admitting: Family

## 2020-05-18 ENCOUNTER — Other Ambulatory Visit: Payer: Self-pay

## 2020-05-18 VITALS — BP 131/86 | HR 88 | Ht 65.0 in | Wt 226.2 lb

## 2020-05-18 DIAGNOSIS — M545 Low back pain, unspecified: Secondary | ICD-10-CM | POA: Diagnosis not present

## 2020-05-18 DIAGNOSIS — I10 Essential (primary) hypertension: Secondary | ICD-10-CM

## 2020-05-18 DIAGNOSIS — Z7689 Persons encountering health services in other specified circumstances: Secondary | ICD-10-CM | POA: Diagnosis not present

## 2020-05-18 DIAGNOSIS — G8929 Other chronic pain: Secondary | ICD-10-CM

## 2020-05-18 DIAGNOSIS — R21 Rash and other nonspecific skin eruption: Secondary | ICD-10-CM | POA: Diagnosis not present

## 2020-05-18 MED ORDER — TRIAMCINOLONE ACETONIDE 0.1 % EX CREA: 1.0000 "application " | TOPICAL_CREAM | Freq: Two times a day (BID) | CUTANEOUS | 0 refills | Status: DC

## 2020-05-18 MED ORDER — AMLODIPINE BESYLATE 10 MG PO TABS: 10.0000 mg | ORAL_TABLET | Freq: Every day | ORAL | 0 refills | Status: DC

## 2020-05-18 MED ORDER — TIZANIDINE HCL 4 MG PO TABS: 4.0000 mg | ORAL_TABLET | Freq: Every day | ORAL | 1 refills | Status: DC

## 2020-05-18 NOTE — Progress Notes (Signed)
Subjective:    Darlene Crosby - 55 y.o. female MRN [id number]  Date of birth: [birth year]  HPI  Darlene Crosby is to establish care. Patient has a PMH significant for essential hypertension, asthma vs VCD, osteoarthritis of left hip, trigger thumb of right hand, lumbar spondylosis, degeneration of lumbar intervertebral disc, surgical wound dehiscence, pyogenic granuloma, dyspnea, spinal stenosis of lumbar region, and hypercholesterolemia.    Current issues and/or concerns: 1. URGENT CARE FOLLOW-UP: Visit on 05/01/2020 at [facility] per PA note:  Unfortunately due to her uncontrolled hypertension will avoid prednisone use.  Recommended increasing her amlodipine to 10 mg, strict hypertensive friendly diet.  Follow-up with PCP.  Today for back wound use Tylenol, tizanidine and tramadol for breakthrough pain.  Follow-up with spine specialist as soon as possible.  Patient does not have symptoms consistent with stroke, ACS and therefore will defer an ER visit. Counseled patient on potential for adverse effects with medications prescribed/recommended today, ER and return-to-clinic precautions discussed, patient verbalized understanding.  05/18/2020 Hypertension Follow-Up: Reports only taking Amlodipine. Reports she stopped taking Lisinopril-Hydrochlorothiazide because the medication made her blood pressure go up, caused dizziness, and increased urination.   Currently taking: see medication list  Med Adherence: [x]  Yes    []  No Medication side effects: []  Yes    [x]  No Adherence with salt restriction (low-salt diet): [x]  Yes    []  No Exercise: At work  Home Monitoring?: [x]  Yes    []  No Monitoring Frequency: [x]  Yes    []  No Home BP results range: [x]  Yes, about the same as today  Smoking []  Yes [x]  No SOB? [x]  Yes, related to abdominal brace Chest Pain?: []  Yes    [x]  No Leg swelling?: []  Yes    [x]  No Headaches?: []  Yes    [x]  No Dizziness? []  Yes    [x]   No  05/18/2020 Back Pain Follow-Up: Reports bilateral lower back pain for about 4 to 5 years. Reports in the past she had an appointment with a back specialist for possible surgery and it was later determined that her health insurance would not cover the fees. Currently trying to find a back specialist that accepts her health insurance. Has an appointment with the Disability doctor on next week to see if she qualifies for Social Security. Has tried back injections and therapy in the past. Currently using Tylenol for daytime pain management and Tizanidine at bedtime, requesting refills.   05/18/2020 Rash: Patient reports rash on bilateral lower extremities appeared over 1 week ago. Denies pain and drainage. Does itch. Denies changing lotions, perfumes, soaps, and detergents. Using aloe vera without relief.    ROS per HPI    Health Maintenance:  Health Maintenance Due  Topic Date Due  . Hepatitis C Screening  Never done  . HIV Screening  Never done  . COLONOSCOPY (Pts 45-73yrs Insurance coverage will need to be confirmed)  Never done  . INFLUENZA VACCINE  09/29/2019    Past Medical History: Patient Active Problem List   Diagnosis Date Noted  . Body mass index (BMI) 37.0-37.9, adult 03/14/2019  . Lumbar stenosis without neurogenic claudication 03/14/2019  . Hypercholesterolemia 02/08/2019  . Lumbar spondylosis 10/25/2018  . Degeneration of lumbar intervertebral disc 10/01/2018  . Surgical wound dehiscence 06/06/2018  . Osteoarthritis of left hip 04/25/2018  . Spinal stenosis of lumbar region 02/23/2018  . Trigger thumb of right hand 03/30/2017  . Dyspnea 03/26/2013  . 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 Essential (primary) hypertension 06/07/2012  . Pyogenic granuloma 02/15/2012    Social History   reports that she has never smoked. She has never used smokeless tobacco. She reports that she does not drink alcohol and does not use drugs.   Family History  family history includes  Allergies in her mother; Asthma in her mother; Diabetes in her sister; Heart disease in her sister; Hypertension in her mother and sister; Stroke in her sister.   Medications: reviewed and updated   Objective:   Physical Exam BP 131/86 (BP Location: Left Arm, Patient Position: Sitting)   Pulse 88   Ht 5\' 5"  (1.651 m)   Wt 226 lb 3.2 oz (102.6 kg)   LMP 05/04/2012   SpO2 97%   BMI 37.64 kg/m  Physical Exam Constitutional:      Appearance: She is obese.  HENT:     Head: Normocephalic and atraumatic.  Eyes:     Conjunctiva/sclera: Conjunctivae normal.     Pupils: Pupils are equal, round, and reactive to light.  Cardiovascular:     Rate and Rhythm: Normal rate and regular rhythm.     Pulses: Normal pulses.     Heart sounds: Normal heart sounds.  Pulmonary:     Effort: Pulmonary effort is normal.     Breath sounds: Normal breath sounds.  Musculoskeletal:     Cervical back: Normal range of motion and neck supple.     Comments: Patient wearing lower abdominal brace.   Skin:    Findings: Rash present. Rash is papular.     Comments: Erythematous rash present bilateral lower extremities at the shin area. No evidence of drainage or edema.   Neurological:     General: No focal deficit present.     Mental Status: She is alert and oriented to person, place, and time.  Psychiatric:        Mood and Affect: Mood normal.        Behavior: Behavior normal.      Assessment & Plan:  1. Encounter to establish care: - Patient presents today to establish care.  - Return for annual physical examination, labs, and health maintenance. Arrive fasting meaning having had no food and/or nothing to drink for at least 8 hours prior to appointment.  Please take scheduled medications as normal.  2. Essential hypertension: - Blood pressure close to goal during today's visit. Reports home blood pressures are the same. - Per patient preference she discontinued Lisinopril-Hydrochlorothiazide related to  side effects.  - Continue Amlodipine as prescribed.  - Counseled on blood pressure goal of less than 130/80, low-sodium, DASH diet, medication compliance, 150 minutes of moderate intensity exercise per week as tolerated. Discussed medication compliance, adverse effects. - CMP to check kidney function, liver function, and electrolyte balance.  - Follow-up with primary provider in 3 months or sooner if needed. - amLODipine (NORVASC) 10 MG tablet; Take 1 tablet (10 mg total) by mouth daily.  Dispense: 90 tablet; Refill: 0 - Comprehensive metabolic panel  3. Chronic bilateral low back pain, unspecified whether sciatica present: - Chronic.  - Patient currently trying to find a back specialist who accepts her health insurance.  - Continue Tizanidine as prescribed. May cause drowsiness. Counseled patient to not consume if operating heavy machinery or driving. Counseled patient to not consume with alcohol. - Follow-up with primary provider as needed.  - tiZANidine (ZANAFLEX) 4 MG tablet; Take 1 tablet (4 mg total) by mouth at bedtime.  Dispense: 30 tablet; Refill:  1  4. Rash and nonspecific skin eruption: - Triamcinolone as prescribed.  - Follow-up with primary provider as scheduled.  - triamcinolone (KENALOG) 0.1 %; Apply 1 application topically 2 (two) times daily.  Dispense: 80 g; Refill: 0    Patient was given clear instructions to go to Emergency Department or return to medical center if symptoms don't improve, worsen, or new problems develop.The patient verbalized understanding.  I discussed the assessment and treatment plan with the patient. The patient was provided an opportunity to ask questions and all were answered. The patient agreed with the plan and demonstrated an understanding of the instructions.   The patient was advised to call back or seek an in-person evaluation if the symptoms worsen or if the condition fails to improve as anticipated.    [name], NP 05/18/2020, 9:56  PM Primary [facility] at [facility]

## 2020-05-18 NOTE — Telephone Encounter (Signed)
Tizanidine tablets were prescribed for patient.

## 2020-05-18 NOTE — Patient Instructions (Addendum)
Return for annual physical examination, labs, and health maintenance. Arrive fasting meaning having had no food and/or nothing to drink for at least 8 hours prior to appointment.  Please take scheduled medications as normal.  Continue Amlodipine for high blood pressure. Follow-up in 3 months or sooner if needed.   Continue Tizanidine for back pain. Follow-up with Orthopedics as scheduled.  Thank you for choosing Primary Care at [facility] for your medical home!    Darlene Crosby was seen by Camillia Herter, NP today.   Darlene Crosby's primary care provider is Camillia Herter, NP.   For the best care possible,  you should try to see [name], NP whenever you come to clinic.   We look forward to seeing you again soon!  If you have any questions about your visit today,  please call us at [phone]  Or feel free to reach your provider via [place].    Hypertension, Adult Hypertension is another name for high blood pressure. High blood pressure forces your heart to work harder to pump blood. This can cause problems over time. There are two numbers in a blood pressure reading. There is a top number (systolic) over a bottom number (diastolic). It is best to have a blood pressure that is below 120/80. Healthy choices can help lower your blood pressure, or you may need medicine to help lower it. What are the causes? The cause of this condition is not known. Some conditions may be related to high blood pressure. What increases the risk?  Smoking.  Having type 2 diabetes mellitus, high cholesterol, or both.  Not getting enough exercise or physical activity.  Being overweight.  Having too much fat, sugar, calories, or salt (sodium) in your diet.  Drinking too much alcohol.  Having long-term (chronic) kidney disease.  Having a family history of high blood pressure.  Age. Risk increases with age.  Race. You may be at higher risk if you are African American.  Gender.  Men are at higher risk than women before age 29. After age 58, women are at higher risk than men.  Having obstructive sleep apnea.  Stress. What are the signs or symptoms?  High blood pressure may not cause symptoms. Very high blood pressure (hypertensive crisis) may cause: ? Headache. ? Feelings of worry or nervousness (anxiety). ? Shortness of breath. ? Nosebleed. ? A feeling of being sick to your stomach (nausea). ? Throwing up (vomiting). ? Changes in how you see. ? Very bad chest pain. ? Seizures. How is this treated?  This condition is treated by making healthy lifestyle changes, such as: ? Eating healthy foods. ? Exercising more. ? Drinking less alcohol.  Your health care provider may prescribe medicine if lifestyle changes are not enough to get your blood pressure under control, and if: ? Your top number is above 130. ? Your bottom number is above 80.  Your personal target blood pressure may vary. Follow these instructions at home: Eating and drinking  If told, follow the DASH eating plan. To follow this plan: ? Fill one half of your plate at each meal with fruits and vegetables. ? Fill one fourth of your plate at each meal with whole grains. Whole grains include whole-wheat pasta, brown rice, and whole-grain bread. ? Eat or drink low-fat dairy products, such as skim milk or low-fat yogurt. ? Fill one fourth of your plate at each meal with low-fat (lean) proteins. Low-fat proteins include fish, chicken without skin, eggs,  beans, and tofu. ? Avoid fatty meat, cured and processed meat, or chicken with skin. ? Avoid pre-made or processed food.  Eat less than 1,500 mg of salt each day.  Do not drink alcohol if: ? Your doctor tells you not to drink. ? You are pregnant, may be pregnant, or are planning to become pregnant.  If you drink alcohol: ? Limit how much you use to:  0-1 drink a day for women.  0-2 drinks a day for men. ? Be aware of how much alcohol is in  your drink. In the [place] one drink equals one 12 oz bottle of beer (355 mL), one 5 oz glass of wine (148 mL), or one 1 oz glass of hard liquor (44 mL).   Lifestyle  Work with your doctor to stay at a healthy weight or to lose weight. Ask your doctor what the best weight is for you.  Get at least 30 minutes of exercise most days of the week. This may include walking, swimming, or biking.  Get at least 30 minutes of exercise that strengthens your muscles (resistance exercise) at least 3 days a week. This may include lifting weights or doing Pilates.  Do not use any products that contain nicotine or tobacco, such as cigarettes, e-cigarettes, and chewing tobacco. If you need help quitting, ask your doctor.  Check your blood pressure at home as told by your doctor.  Keep all follow-up visits as told by your doctor. This is important.   Medicines  Take over-the-counter and prescription medicines only as told by your doctor. Follow directions carefully.  Do not skip doses of blood pressure medicine. The medicine does not work as well if you skip doses. Skipping doses also puts you at risk for problems.  Ask your doctor about side effects or reactions to medicines that you should watch for. Contact a doctor if you:  Think you are having a reaction to the medicine you are taking.  Have headaches that keep coming back (recurring).  Feel dizzy.  Have swelling in your ankles.  Have trouble with your vision. Get help right away if you:  Get a very bad headache.  Start to feel mixed up (confused).  Feel weak or numb.  Feel faint.  Have very bad pain in your: ? Chest. ? Belly (abdomen).  Throw up more than once.  Have trouble breathing. Summary  Hypertension is another name for high blood pressure.  High blood pressure forces your heart to work harder to pump blood.  For most people, a normal blood pressure is less than 120/80.  Making healthy choices can help lower blood  pressure. If your blood pressure does not get lower with healthy choices, you may need to take medicine. This information is not intended to replace advice given to you by your health care provider. Make sure you discuss any questions you have with your health care provider. Document Revised: 10/25/2017 Document Reviewed: 10/25/2017 Elsevier Patient Education  2021 [employer].

## 2020-05-18 NOTE — Telephone Encounter (Signed)
[facility] called and . they need clarification on the tizanidine.[facility] needs to know if it is a creame or an ointment.

## 2020-05-18 NOTE — Progress Notes (Signed)
Establish care Lower back pain

## 2020-05-19 LAB — COMPREHENSIVE METABOLIC PANEL
ALT: 17 IU/L (ref 0–32)
AST: 18 IU/L (ref 0–40)
Albumin/Globulin Ratio: 1.4 (ref 1.2–2.2)
Albumin: 4.8 g/dL (ref 3.8–4.9)
Alkaline Phosphatase: 107 IU/L (ref 44–121)
BUN/Creatinine Ratio: 12 (ref 9–23)
BUN: 9 mg/dL (ref 6–24)
Bilirubin Total: 0.3 mg/dL (ref 0.0–1.2)
CO2: 20 mmol/L (ref 20–29)
Calcium: 10 mg/dL (ref 8.7–10.2)
Chloride: 102 mmol/L (ref 96–106)
Creatinine, Ser: 0.74 mg/dL (ref 0.57–1.00)
Globulin, Total: 3.4 g/dL (ref 1.5–4.5)
Glucose: 105 mg/dL — ABNORMAL HIGH (ref 65–99)
Potassium: 4.1 mmol/L (ref 3.5–5.2)
Sodium: 139 mmol/L (ref 134–144)
Total Protein: 8.2 g/dL (ref 6.0–8.5)
eGFR: 95 mL/min/{1.73_m2} (ref >59)

## 2020-05-19 NOTE — Progress Notes (Signed)
Kidney function normal.   Liver function normal.

## 2020-06-09 ENCOUNTER — Ambulatory Visit: Payer: 59 | Admitting: Family

## 2020-07-16 NOTE — Progress Notes (Signed)
Patient ID: Darlene Crosby, female    DOB: [birth year]  MRN: [id number]  CC: Back Pain and Hypertension Follow-Up  Subjective: Darlene Crosby is a 55 y.o. female who presents for back pain and hypertension follow-up.  Her concerns today include:   1. HYPERTENSION FOLLOW-UP: 05/18/2020: - Blood pressure close to goal during today's visit. Reports home blood pressures are the same. - Per patient preference she discontinued Lisinopril-Hydrochlorothiazide related to side effects.  - Continue Amlodipine as prescribed.  - Follow-up with primary provider in 3 months or sooner if needed.  07/17/2020: Currently taking: see medication list Med Adherence: [x]  Yes    []  No Medication side effects: []  Yes    [x]  No Smoking []  Yes [x]  No SOB? []  Yes    [x]  No Chest Pain?: []  Yes    [x]  No Leg swelling?: []  Yes    [x]  No   2. BACK PAIN FOLLOW-UP: 05/18/2020: - Chronic.  - Patient currently trying to find a back specialist who accepts her health insurance.  - Continue Tizanidine as prescribed. May cause drowsiness. Counseled patient to not consume if operating heavy machinery or driving. Counseled patient to not consume with alcohol. - Follow-up with primary provider as needed.   07/17/2020: Reports bilateral lower back pain worsening. Requesting an order for new back brace. Taking Tizanidine as prescribed.  Patient Active Problem List   Diagnosis Date Noted  . Body mass index (BMI) 37.0-37.9, adult 03/14/2019  . Lumbar stenosis without neurogenic claudication 03/14/2019  . Hypercholesterolemia 02/08/2019  . Lumbar spondylosis 10/25/2018  . Degeneration of lumbar intervertebral disc 10/01/2018  . Surgical wound dehiscence 06/06/2018  . Osteoarthritis of left hip 04/25/2018  . Spinal stenosis of lumbar region 02/23/2018  . Trigger thumb of right hand 03/30/2017  . Dyspnea 03/26/2013  . 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 Essential (primary) hypertension 06/07/2012  . Pyogenic granuloma  02/15/2012     Current Outpatient Medications on File Prior to Visit  Medication Sig Dispense Refill  . albuterol (PROVENTIL) (2.5 MG/3ML) 0.083% nebulizer solution Inhale 2.5 mg into the lungs every 4 (four) hours as needed for wheezing or shortness of breath.     Marland Kitchen albuterol (VENTOLIN HFA) 108 (90 Base) MCG/ACT inhaler Inhale 2 puffs into the lungs every 4 (four) hours as needed (wheezing and SOB).     Marland Kitchen amLODipine (NORVASC) 10 MG tablet Take 1 tablet (10 mg total) by mouth daily. 90 tablet 0  . cyclobenzaprine (FLEXERIL) 10 MG tablet Take 10 mg by mouth daily as needed.    Marland Kitchen ibuprofen (ADVIL,MOTRIN) 200 MG tablet Take 600 mg by mouth 2 (two) times daily as needed for moderate pain.    Marland Kitchen levocetirizine (XYZAL) 5 MG tablet Take 5 mg by mouth every evening.    . Multiple Vitamin (MULTIVITAMIN) tablet Take 1 tablet by mouth daily.    . rosuvastatin (CRESTOR) 10 MG tablet Take 10 mg by mouth at bedtime.     . triamcinolone (KENALOG) 0.1 % Apply 1 application topically 2 (two) times daily. 80 g 0   No current facility-administered medications on file prior to visit.    Allergies  Allergen Reactions  . Pineapple Shortness Of Breath, Swelling and Hives    Swelling of tongue   . Chocolate Hives  . Coconut Oil Hives and Rash  . Strawberry Extract Hives  . Wheat Bran Hives    Social History   Socioeconomic History  . Marital status: Widowed    Spouse name:  Not on file  . Number of children: Not on file  . Years of education: Not on file  . Highest education level: Not on file  Occupational History  . Not on file  Tobacco Use  . Smoking status: Never Smoker  . Smokeless tobacco: Never Used  Vaping Use  . Vaping Use: Never used  Substance and Sexual Activity  . Alcohol use: No  . Drug use: Never  . Sexual activity: Not on file  Other Topics Concern  . Not on file  Social History Narrative  . Not on file   Social Determinants of Health   Financial Resource Strain: Not on  file  Food Insecurity: Not on file  Transportation Needs: Not on file  Physical Activity: Not on file  Stress: Not on file  Social Connections: Not on file  Intimate Partner Violence: Not on file    Family History  Problem Relation Age of Onset  . Hypertension Mother   . Asthma Mother   . Allergies Mother   . Heart disease Sister   . Hypertension Sister   . Stroke Sister   . Diabetes Sister     Past Surgical History:  Procedure Laterality Date  . ANTERIOR HIP REVISION Left 06/06/2018   Procedure: LEFT HIP WOUND DEHISCENCE POSSIBLE HEAD AND LINER EXCHANGE;  Surgeon: Rod Can, MD;  Location: [facility];  Service: Orthopedics;  Laterality: Left;  . COLONOSCOPY  2015  . DIAGNOSTIC LAPAROSCOPY     tubal preg-took ovary and tube  . LAPAROSCOPY FOR ECTOPIC PREGNANCY  1990s   LEFT SALPINGECTOMY AND RIGHT OOPHORECTOMY FOR ABNORMALITY  . MASS EXCISION  02/15/2012   Procedure: EXCISION MASS;  Surgeon: Schuyler Amor, MD;  Location: [facility];  Service: Orthopedics;  Laterality: Right;  Excision of Right Small Volar Mass  . TONSILLECTOMY  age 28  . TOTAL HIP ARTHROPLASTY Left 04/25/2018   Procedure: TOTAL HIP ARTHROPLASTY ANTERIOR APPROACH;  Surgeon: Rod Can, MD;  Location: [facility];  Service: Orthopedics;  Laterality: Left;  . TRIGGER FINGER RELEASE Right 2018   thumb  . WISDOM TOOTH EXTRACTION  age 63    ROS: Review of Systems Negative except as stated above  PHYSICAL EXAM: BP 137/88 (BP Location: Left Arm, Patient Position: Sitting, Cuff Size: Large)   Pulse 94   Temp 98.2 F (36.8 C)   Resp 15   Ht 5\' 5"  (1.651 m)   Wt 220 lb 6.4 oz (100 kg)   LMP 05/04/2012   SpO2 97%   BMI 36.68 kg/m   Physical Exam HENT:     Head: Normocephalic and atraumatic.  Eyes:     Extraocular Movements: Extraocular movements intact.     Conjunctiva/sclera: Conjunctivae normal.     Pupils: Pupils are equal, round, and reactive to light.  Cardiovascular:      Rate and Rhythm: Normal rate and regular rhythm.     Pulses: Normal pulses.     Heart sounds: Normal heart sounds.  Pulmonary:     Effort: Pulmonary effort is normal.     Breath sounds: Normal breath sounds.  Musculoskeletal:     Cervical back: Normal range of motion and neck supple.     Comments: Patient has lower back brace.  Neurological:     General: No focal deficit present.     Mental Status: She is alert and oriented to person, place, and time.  Psychiatric:        Mood and Affect: Mood normal.  Behavior: Behavior normal.     ASSESSMENT AND PLAN: 1. Essential hypertension: - Blood pressure at goal during today's office visit.  - Continue Amlodipine as prescribed.  - Counseled on blood pressure goal of less than 130/80, low-sodium, DASH diet, medication compliance, 150 minutes of moderate intensity exercise per week as tolerated. Discussed medication compliance, adverse effects. - Follow-up with primary provider in 3 months or sooner if needed.  - amLODipine (NORVASC) 10 MG tablet; Take 1 tablet (10 mg total) by mouth daily.  Dispense: 90 tablet; Refill: 0  2. Chronic bilateral low back pain, unspecified whether sciatica present: - Diagnostic x-ray lumbar spine for further evaluation.  - Lumbar back brace for support.  - Continue Tizanidine as prescribed.  - Follow-up with primary provider as scheduled.  - DG Lumbar Spine Complete; Future - Elastic Bandages & Supports (LUMBAR BACK BRACE/SUPPORT PAD) MISC; 1 each by Does not apply route as needed.  Dispense: 1 each; Refill: 0   Patient was given the opportunity to ask questions.  Patient verbalized understanding of the plan and was able to repeat key elements of the plan. Patient was given clear instructions to go to Emergency Department or return to medical center if symptoms don't improve, worsen, or new problems develop.The patient verbalized understanding.   Orders Placed This Encounter  Procedures  . DG Lumbar  Spine Complete     Requested Prescriptions   Signed Prescriptions Disp Refills  . Elastic Bandages & Supports (LUMBAR BACK BRACE/SUPPORT PAD) MISC 1 each 0    Sig: 1 each by Does not apply route as needed.   Follow-up with primary provider in 3 months for hypertension and as needed for back pain.   Camillia Herter, NP

## 2020-07-17 ENCOUNTER — Ambulatory Visit (INDEPENDENT_AMBULATORY_CARE_PROVIDER_SITE_OTHER): Payer: 59 | Admitting: Family

## 2020-07-17 ENCOUNTER — Other Ambulatory Visit: Payer: Self-pay

## 2020-07-17 ENCOUNTER — Ambulatory Visit (INDEPENDENT_AMBULATORY_CARE_PROVIDER_SITE_OTHER): Payer: 59

## 2020-07-17 VITALS — BP 137/88 | HR 94 | Temp 98.2°F | Resp 15 | Ht 65.0 in | Wt 220.4 lb

## 2020-07-17 DIAGNOSIS — M545 Low back pain, unspecified: Secondary | ICD-10-CM | POA: Diagnosis not present

## 2020-07-17 DIAGNOSIS — G8929 Other chronic pain: Secondary | ICD-10-CM | POA: Diagnosis not present

## 2020-07-17 DIAGNOSIS — I10 Essential (primary) hypertension: Secondary | ICD-10-CM

## 2020-07-17 NOTE — Progress Notes (Signed)
Lower back pain Experiencing spasms at bedtime

## 2020-07-19 MED ORDER — LUMBAR BACK BRACE/SUPPORT PAD MISC: 1.0000 | 0 refills | Status: AC | PRN

## 2020-07-20 ENCOUNTER — Telehealth: Payer: Self-pay | Admitting: Family

## 2020-07-20 NOTE — Telephone Encounter (Signed)
Pt called in stating she needs a refill on her medication   tiZANidine (ZANAFLEX) 4 MG tablet   [facility], [place]  [place], [place] [address]  Phone:  [phone] Fax:  [phone]    Pt also stated a script for her Elastic Bandages & Supports (LUMBAR BACK BRACE/SUPPORT PAD) MISC  Needs to be called in to [facility]: [facility] [address] [place], [place], [place] [address] Phone: [phone]

## 2020-07-21 ENCOUNTER — Other Ambulatory Visit: Payer: Self-pay | Admitting: Family

## 2020-07-21 DIAGNOSIS — M545 Low back pain, unspecified: Secondary | ICD-10-CM

## 2020-07-21 DIAGNOSIS — G8929 Other chronic pain: Secondary | ICD-10-CM

## 2020-07-21 DIAGNOSIS — M5136 Other intervertebral disc degeneration, lumbar region: Secondary | ICD-10-CM

## 2020-07-21 MED ORDER — TIZANIDINE HCL 4 MG PO TABS: 4.0000 mg | ORAL_TABLET | Freq: Every day | ORAL | 1 refills | Status: DC

## 2020-07-21 MED ORDER — AMLODIPINE BESYLATE 10 MG PO TABS: 10.0000 mg | ORAL_TABLET | Freq: Every day | ORAL | 0 refills | Status: DC

## 2020-07-21 NOTE — Progress Notes (Signed)
Lower back with arthritis. Referral to Orthopedics for further evaluation.

## 2020-07-21 NOTE — Progress Notes (Signed)
The Orthopedics office should call patient within 2 weeks with appointment details.

## 2020-08-05 ENCOUNTER — Telehealth: Payer: Self-pay | Admitting: Family

## 2020-08-05 NOTE — Telephone Encounter (Signed)
Pt calling for an update on her previous call/encounter (07/20/20)  Regarding the Back Brace stating she's been needing that urgently and it was sent to [facility] but [facility] states they don't have one.  Pt also has not been able to get the appt for the APPT for ortho schedule

## 2020-08-14 ENCOUNTER — Encounter: Payer: Self-pay | Admitting: Orthopaedic Surgery

## 2020-08-14 ENCOUNTER — Ambulatory Visit (INDEPENDENT_AMBULATORY_CARE_PROVIDER_SITE_OTHER): Payer: 59 | Admitting: Orthopaedic Surgery

## 2020-08-14 VITALS — BP 115/79 | HR 91 | Ht 65.0 in | Wt 220.0 lb

## 2020-08-14 DIAGNOSIS — M47816 Spondylosis without myelopathy or radiculopathy, lumbar region: Secondary | ICD-10-CM | POA: Diagnosis not present

## 2020-08-14 DIAGNOSIS — M5136 Other intervertebral disc degeneration, lumbar region: Secondary | ICD-10-CM

## 2020-08-14 NOTE — Progress Notes (Signed)
Office Visit Note   Patient: Darlene Crosby           Date of Birth: 07/17/1965           MRN: [id number] Visit Date: 08/14/2020              Requested by: Camillia Herter, NP [address] [place],  [place] [address] PCP: Camillia Herter, NP   Assessment & Plan: Visit Diagnoses:  1. Degeneration of lumbar intervertebral disc   2. Lumbar spondylosis     Plan: Patient is got some improvement with weight loss and is continuing on the current program.  We reviewed previous imaging studies which shows some disc degeneration.  She does not have any areas of severe compression and no nerve root tension signs currently.  She can follow-up if she develops progressive symptoms.  Follow-Up Instructions: No follow-ups on file.   Orders:  No orders of the defined types were placed in this encounter.  No orders of the defined types were placed in this encounter.     Procedures: No procedures performed   Clinical Data: No additional findings.   Subjective: Chief Complaint  Patient presents with   Lower Back - Pain    HPI 55 year old female seen with ongoing problems with low back pain with occasional sharp pains that wake her up at night.  States the pain at the lumbosacral junction radiates into the top of the buttocks both right and left.  No radiation into her legs.  She has been wearing her back brace all the time.  She is lost 6pounds.  She is taken tizanidine she thinks it may help some sometimes makes her sleepy.  Recent lumbar images 07/20/2020 showed trace retrolisthesis L1-2 with multilevel degenerative changes.  Lumbar myogram CT scan 03/28/2019 showed multilevel spondylosis mild at L2-3 and mild to moderate L3-4 with spinal canal stenosis.  Moderate neuroforaminal stenosis L3-4 and L4-5.  Disc bulging was slightly worse with extension.  No instability on flexion-extension myelogram images.  [facility] MRI scan 02/18/2019 was reviewed today as well as her myelogram  CT scan ordered by Dr. Ronnald Ramp.  Review of Systems previous total hip arthroplasty Dr. Lyla Glassing doing well.  She had reoperation for left hip wound dehiscence and application of wound VAC about 2 months postop.  Problems with chronic back pain.   Objective: Vital Signs: BP 115/79   Pulse 91   Ht 5\' 5"  (1.651 m)   Wt 220 lb (99.8 kg)   LMP 05/04/2012   BMI 36.61 kg/m   Physical Exam Constitutional:      Appearance: She is well-developed.  HENT:     Head: Normocephalic.     Right Ear: External ear normal.     Left Ear: External ear normal. There is no impacted cerumen.  Eyes:     Pupils: Pupils are equal, round, and reactive to light.  Neck:     Thyroid: No thyromegaly.     Trachea: No tracheal deviation.  Cardiovascular:     Rate and Rhythm: Normal rate.  Pulmonary:     Effort: Pulmonary effort is normal.  Abdominal:     Palpations: Abdomen is soft.  Musculoskeletal:     Cervical back: No rigidity.  Skin:    General: Skin is warm and dry.  Neurological:     Mental Status: She is alert and oriented to person, place, and time.  Psychiatric:        Behavior: Behavior normal.  Ortho Exam patient is moving easier from sitting standing heel and toe walking normal negative logroll of the hips.  Well-healed hip incision.  Knee reach full extension anterior tib EHL is intact knee and ankle jerk are intact.  Specialty Comments:  No specialty comments available.  Imaging: No results found.   PMFS History: Patient Active Problem List   Diagnosis Date Noted   Body mass index (BMI) 37.0-37.9, adult 03/14/2019   Lumbar stenosis without neurogenic claudication 03/14/2019   Hypercholesterolemia 02/08/2019   Lumbar spondylosis 10/25/2018   Degeneration of lumbar intervertebral disc 10/01/2018   Surgical wound dehiscence 06/06/2018   Osteoarthritis of left hip 04/25/2018   Spinal stenosis of lumbar region 02/23/2018   Trigger thumb of right hand 03/30/2017   Dyspnea  03/26/2013   Asthma vs VCD  02/28/2013   Essential (primary) hypertension 06/07/2012   Pyogenic granuloma 02/15/2012   Past Medical History:  Diagnosis Date   Asthma    followed by pcp   Hypertension    OA (osteoarthritis)    left knee, right shoulder, left hip   Seasonal allergies    Wears glasses     Family History  Problem Relation Age of Onset   Hypertension Mother    Asthma Mother    Allergies Mother    Heart disease Sister    Hypertension Sister    Stroke Sister    Diabetes Sister     Past Surgical History:  Procedure Laterality Date   ANTERIOR HIP REVISION Left 06/06/2018   Procedure: LEFT HIP WOUND DEHISCENCE POSSIBLE HEAD AND LINER EXCHANGE;  Surgeon: Rod Can, MD;  Location: [facility];  Service: Orthopedics;  Laterality: Left;   COLONOSCOPY  2015   DIAGNOSTIC LAPAROSCOPY     tubal preg-took ovary and tube   LAPAROSCOPY FOR ECTOPIC PREGNANCY  1990s   LEFT SALPINGECTOMY AND RIGHT OOPHORECTOMY FOR ABNORMALITY   MASS EXCISION  02/15/2012   Procedure: EXCISION MASS;  Surgeon: Schuyler Amor, MD;  Location: [facility];  Service: Orthopedics;  Laterality: Right;  Excision of Right Small Volar Mass   TONSILLECTOMY  age 28   TOTAL HIP ARTHROPLASTY Left 04/25/2018   Procedure: TOTAL HIP ARTHROPLASTY ANTERIOR APPROACH;  Surgeon: Rod Can, MD;  Location: [facility];  Service: Orthopedics;  Laterality: Left;   TRIGGER FINGER RELEASE Right 2018   thumb   WISDOM TOOTH EXTRACTION  age 72   Social History   Occupational History   Not on file  Tobacco Use   Smoking status: Never   Smokeless tobacco: Never  Vaping Use   Vaping Use: Never used  Substance and Sexual Activity   Alcohol use: No   Drug use: Never   Sexual activity: Not on file

## 2020-08-24 ENCOUNTER — Other Ambulatory Visit: Payer: Self-pay | Admitting: Family

## 2020-08-24 DIAGNOSIS — M545 Low back pain, unspecified: Secondary | ICD-10-CM

## 2020-08-24 NOTE — Progress Notes (Signed)
Patient ID: Darlene Crosby, female    DOB: 1965-06-15  MRN: [id number]  CC: Annual Physical Exam   Subjective: Darlene Crosby is a 55 y.o. female who presents for annual physical exam.   Her concerns today include:   HYPERTENSION FOLLOW-UP: 07/17/2020: - Blood pressure at goal during today's office visit. - Continue Amlodipine as prescribed. - Follow-up with primary provider in 3 months or sooner if needed.   08/25/2020: Amlodipine causing daily bilateral ankle swelling. Worse when standing for long periods of time. She does work 12 hour shifts on the weekends. Still able to fit her shoes. She is elevating the legs when able to do so. Not wearing compression stockings. Home blood pressure readings 110's-130's/70's-80's. Reports has tried Losartan, Lisinopril, and Hydrochlorothiazide in the past with side effects.    Patient Active Problem List   Diagnosis Date Noted   Body mass index (BMI) 37.0-37.9, adult 03/14/2019   Lumbar stenosis without neurogenic claudication 03/14/2019   Hypercholesterolemia 02/08/2019   Lumbar spondylosis 10/25/2018   Degeneration of lumbar intervertebral disc 10/01/2018   Surgical wound dehiscence 06/06/2018   Osteoarthritis of left hip 04/25/2018   Spinal stenosis of lumbar region 02/23/2018   Trigger thumb of right hand 03/30/2017   Dyspnea 03/26/2013   Asthma vs VCD  02/28/2013   Essential (primary) hypertension 06/07/2012   Pyogenic granuloma 02/15/2012     Current Outpatient Medications on File Prior to Visit  Medication Sig Dispense Refill   albuterol (PROVENTIL) (2.5 MG/3ML) 0.083% nebulizer solution Inhale 2.5 mg into the lungs every 4 (four) hours as needed for wheezing or shortness of breath.      albuterol (VENTOLIN HFA) 108 (90 Base) MCG/ACT inhaler Inhale 2 puffs into the lungs every 4 (four) hours as needed (wheezing and SOB).      cyclobenzaprine (FLEXERIL) 10 MG tablet Take 10 mg by mouth daily as needed.     Elastic Bandages &  Supports (LUMBAR BACK BRACE/SUPPORT PAD) MISC 1 each by Does not apply route as needed. 1 each 0   ibuprofen (ADVIL,MOTRIN) 200 MG tablet Take 600 mg by mouth 2 (two) times daily as needed for moderate pain.     levocetirizine (XYZAL) 5 MG tablet Take 5 mg by mouth every evening.     Multiple Vitamin (MULTIVITAMIN) tablet Take 1 tablet by mouth daily.     rosuvastatin (CRESTOR) 10 MG tablet Take 10 mg by mouth at bedtime.      tiZANidine (ZANAFLEX) 4 MG tablet Take 1 tablet (4 mg total) by mouth at bedtime. 30 tablet 1   triamcinolone (KENALOG) 0.1 % Apply 1 application topically 2 (two) times daily. 80 g 0   No current facility-administered medications on file prior to visit.    Allergies  Allergen Reactions   Pineapple Shortness Of Breath, Swelling and Hives    Swelling of tongue    Chocolate Hives   Coconut Oil Hives and Rash   Strawberry Extract Hives   Wheat Bran Hives    Social History   Socioeconomic History   Marital status: Widowed    Spouse name: Not on file   Number of children: Not on file   Years of education: Not on file   Highest education level: Not on file  Occupational History   Not on file  Tobacco Use   Smoking status: Never   Smokeless tobacco: Never  Vaping Use   Vaping Use: Never used  Substance and Sexual Activity   Alcohol use: No  Drug use: Never   Sexual activity: Not on file  Other Topics Concern   Not on file  Social History Narrative   Not on file   Social Determinants of Health   Financial Resource Strain: Not on file  Food Insecurity: Not on file  Transportation Needs: Not on file  Physical Activity: Not on file  Stress: Not on file  Social Connections: Not on file  Intimate Partner Violence: Not on file    Family History  Problem Relation Age of Onset   Hypertension Mother    Asthma Mother    Allergies Mother    Heart disease Sister    Hypertension Sister    Stroke Sister    Diabetes Sister     Past Surgical History:   Procedure Laterality Date   ANTERIOR HIP REVISION Left 06/06/2018   Procedure: LEFT HIP WOUND DEHISCENCE POSSIBLE HEAD AND LINER EXCHANGE;  Surgeon: Rod Can, MD;  Location: [facility];  Service: Orthopedics;  Laterality: Left;   COLONOSCOPY  2015   DIAGNOSTIC LAPAROSCOPY     tubal preg-took ovary and tube   LAPAROSCOPY FOR ECTOPIC PREGNANCY  1990s   LEFT SALPINGECTOMY AND RIGHT OOPHORECTOMY FOR ABNORMALITY   MASS EXCISION  02/15/2012   Procedure: EXCISION MASS;  Surgeon: Schuyler Amor, MD;  Location: [facility];  Service: Orthopedics;  Laterality: Right;  Excision of Right Small Volar Mass   TONSILLECTOMY  age 60   TOTAL HIP ARTHROPLASTY Left 04/25/2018   Procedure: TOTAL HIP ARTHROPLASTY ANTERIOR APPROACH;  Surgeon: Rod Can, MD;  Location: [facility];  Service: Orthopedics;  Laterality: Left;   TRIGGER FINGER RELEASE Right 2018   thumb   WISDOM TOOTH EXTRACTION  age 67    ROS: Review of Systems Negative except as stated above  PHYSICAL EXAM: BP 137/90 (BP Location: Left Arm, Patient Position: Sitting, Cuff Size: Normal)   Pulse 90   Temp 98 F (36.7 C)   Resp 16   Ht 5\' 5"  (1.651 m)   Wt 220 lb 6.4 oz (100 kg)   LMP 05/04/2012   SpO2 95%   BMI 36.68 kg/m   Vitals with BMI 08/25/2020 08/14/2020 07/17/2020  Height 5\' 5"  5\' 5"  5\' 5"   Weight 220 lbs 6 oz 220 lbs 220 lbs 6 oz  BMI 36.68 82.42 35.36  Systolic 144 315 400  Diastolic 90 79 88  Pulse 90 91 94    Physical Exam HENT:     Head: Normocephalic and atraumatic.     Right Ear: Tympanic membrane, ear canal and external ear normal.     Left Ear: Tympanic membrane, ear canal and external ear normal.  Eyes:     Extraocular Movements: Extraocular movements intact.     Conjunctiva/sclera: Conjunctivae normal.     Pupils: Pupils are equal, round, and reactive to light.  Cardiovascular:     Rate and Rhythm: Normal rate and regular rhythm.     Pulses: Normal pulses.     Heart sounds: Normal  heart sounds.  Pulmonary:     Effort: Pulmonary effort is normal.     Breath sounds: Normal breath sounds.  Chest:     Comments: Patient declined examination.  Abdominal:     General: Bowel sounds are normal.     Palpations: Abdomen is soft.  Genitourinary:    Comments: Patient declined examination.  Musculoskeletal:     Cervical back: Normal range of motion and neck supple.     Comments: Limited range of motion  left lower extremity and back.   Skin:    General: Skin is warm and dry.     Capillary Refill: Capillary refill takes less than 2 seconds.  Neurological:     General: No focal deficit present.     Mental Status: She is alert and oriented to person, place, and time.  Psychiatric:        Mood and Affect: Mood normal.        Behavior: Behavior normal.   Physical Exam HENT:     Head: Normocephalic and atraumatic.     Right Ear: Tympanic membrane, ear canal and external ear normal.     Left Ear: Tympanic membrane, ear canal and external ear normal.  Eyes:     Extraocular Movements: Extraocular movements intact.     Conjunctiva/sclera: Conjunctivae normal.     Pupils: Pupils are equal, round, and reactive to light.  Cardiovascular:     Rate and Rhythm: Normal rate and regular rhythm.     Pulses: Normal pulses.     Heart sounds: Normal heart sounds.  Pulmonary:     Effort: Pulmonary effort is normal.     Breath sounds: Normal breath sounds.  Chest:     Comments: Patient declined examination.  Abdominal:     General: Bowel sounds are normal.     Palpations: Abdomen is soft.  Genitourinary:    Comments: Patient declined examination.  Musculoskeletal:     Cervical back: Normal range of motion and neck supple.     Comments: Limited range of motion left lower extremity and back.   Skin:    General: Skin is warm and dry.     Capillary Refill: Capillary refill takes less than 2 seconds.  Neurological:     General: No focal deficit present.     Mental Status: She is  alert and oriented to person, place, and time.  Psychiatric:        Mood and Affect: Mood normal.        Behavior: Behavior normal.    ASSESSMENT AND PLAN: 1. Annual physical exam: - Counseled on 150 minutes of exercise per week as tolerated, healthy eating (including decreased daily intake of saturated fats, cholesterol, added sugars, sodium), STI prevention, and routine healthcare maintenance.  2. Screening for metabolic disorder: - CMP last obtained 05/18/2020 and essentially normal at that time.   3. Screening for iron deficiency anemia: - CBC to screen for anemia. - CBC  4. Diabetes mellitus screening: - Hemoglobin A1c to screen for pre-diabetes/diabetes. - Hemoglobin A1c  5. Screening cholesterol level: - History of hypercholesterolemia. - Lipid panel to screen for high cholesterol.  - Lipid Panel  6. Thyroid disorder screen: - TSH to check thyroid function.  - TSH  7. Need for hepatitis C screening test: - Hepatitis C antibody to screen for hepatitis C.  - Hepatitis C Antibody  8. Encounter for screening for HIV: - HIV antibody to screen for human immunodeficiency virus.  - HIV antibody (with reflex)  9. Colon cancer screening: - Referral to Gastroenterology for colon cancer screening by colonoscopy. - Ambulatory referral to Gastroenterology  10. Essential hypertension: - Blood pressure today in office close to goal.  - Home blood pressures overall close to goal.  - Amlodipine at the current dosage causing bilateral ankle swelling.  - Decrease Amlodipine from 10 mg daily to 5 mg daily.  - Patient intolerant to Lisinopril, Losartan, and Hydrochlorothiazide.  - Will hold adding a second medication at the moment.  -  Follow-up with primary provider in 2 weeks or sooner if needed for blood pressure check. - amLODipine (NORVASC) 5 MG tablet; Take 1 tablet (5 mg total) by mouth daily.  Dispense: 90 tablet; Refill: 0    Patient was given the opportunity to ask  questions.  Patient verbalized understanding of the plan and was able to repeat key elements of the plan. Patient was given clear instructions to go to Emergency Department or return to medical center if symptoms don't improve, worsen, or new problems develop.The patient verbalized understanding.   Orders Placed This Encounter  Procedures   Hepatitis C Antibody   HIV antibody (with reflex)   CBC   TSH   Hemoglobin A1c   Lipid Panel   Ambulatory referral to Gastroenterology     Requested Prescriptions   Signed Prescriptions Disp Refills   amLODipine (NORVASC) 5 MG tablet 90 tablet 0    Sig: Take 1 tablet (5 mg total) by mouth daily.    Return in about 2 weeks (around 09/08/2020) for Telephone Visit hypertension.  Camillia Herter, NP

## 2020-08-25 ENCOUNTER — Encounter: Payer: Self-pay | Admitting: Family

## 2020-08-25 ENCOUNTER — Other Ambulatory Visit: Payer: Self-pay

## 2020-08-25 ENCOUNTER — Ambulatory Visit (INDEPENDENT_AMBULATORY_CARE_PROVIDER_SITE_OTHER): Payer: 59 | Admitting: Family

## 2020-08-25 VITALS — BP 137/90 | HR 90 | Temp 98.0°F | Resp 16 | Ht 65.0 in | Wt 220.4 lb

## 2020-08-25 DIAGNOSIS — Z0000 Encounter for general adult medical examination without abnormal findings: Secondary | ICD-10-CM

## 2020-08-25 DIAGNOSIS — Z1211 Encounter for screening for malignant neoplasm of colon: Secondary | ICD-10-CM

## 2020-08-25 DIAGNOSIS — Z23 Encounter for immunization: Secondary | ICD-10-CM | POA: Diagnosis not present

## 2020-08-25 DIAGNOSIS — Z1159 Encounter for screening for other viral diseases: Secondary | ICD-10-CM

## 2020-08-25 DIAGNOSIS — Z131 Encounter for screening for diabetes mellitus: Secondary | ICD-10-CM

## 2020-08-25 DIAGNOSIS — I10 Essential (primary) hypertension: Secondary | ICD-10-CM

## 2020-08-25 DIAGNOSIS — Z1329 Encounter for screening for other suspected endocrine disorder: Secondary | ICD-10-CM

## 2020-08-25 DIAGNOSIS — Z13220 Encounter for screening for lipoid disorders: Secondary | ICD-10-CM

## 2020-08-25 DIAGNOSIS — Z13228 Encounter for screening for other metabolic disorders: Secondary | ICD-10-CM

## 2020-08-25 DIAGNOSIS — Z114 Encounter for screening for human immunodeficiency virus [HIV]: Secondary | ICD-10-CM

## 2020-08-25 DIAGNOSIS — Z130 Encounter for screening for diseases of the blood and blood-forming organs and certain disorders involving the immune mechanism: Secondary | ICD-10-CM

## 2020-08-25 MED ORDER — AMLODIPINE BESYLATE 5 MG PO TABS: 5.0000 mg | ORAL_TABLET | Freq: Every day | ORAL | 0 refills | Status: DC

## 2020-08-25 NOTE — Addendum Note (Signed)
Addended by: Elmon Else on: 08/25/2020 12:38 PM   Modules accepted: Orders

## 2020-08-25 NOTE — Progress Notes (Signed)
Pt presents for annual physical exam, Needs back brace order sent to [facility],  Pt states that Amlodipine is causing swelling

## 2020-08-26 ENCOUNTER — Other Ambulatory Visit: Payer: Self-pay | Admitting: Family

## 2020-08-26 DIAGNOSIS — E785 Hyperlipidemia, unspecified: Secondary | ICD-10-CM

## 2020-08-26 LAB — LIPID PANEL
Chol/HDL Ratio: 4.5 ratio — ABNORMAL HIGH (ref 0.0–4.4)
Cholesterol, Total: 189 mg/dL (ref 100–199)
HDL: 42 mg/dL (ref >39)
LDL Chol Calc (NIH): 120 mg/dL — ABNORMAL HIGH (ref 0–99)
Triglycerides: 152 mg/dL — ABNORMAL HIGH (ref 0–149)
VLDL Cholesterol Cal: 27 mg/dL (ref 5–40)

## 2020-08-26 LAB — CBC
Hematocrit: 45.6 % (ref 34.0–46.6)
Hemoglobin: 15 g/dL (ref 11.1–15.9)
MCH: 27.6 pg (ref 26.6–33.0)
MCHC: 32.9 g/dL (ref 31.5–35.7)
MCV: 84 fL (ref 79–97)
Platelets: 368 10*3/uL (ref 150–450)
RBC: 5.43 x10E6/uL — ABNORMAL HIGH (ref 3.77–5.28)
RDW: 13.6 % (ref 11.7–15.4)
WBC: 7.3 10*3/uL (ref 3.4–10.8)

## 2020-08-26 LAB — HEMOGLOBIN A1C
Est. average glucose Bld gHb Est-mCnc: 123 mg/dL
Hgb A1c MFr Bld: 5.9 % — ABNORMAL HIGH (ref 4.8–5.6)

## 2020-08-26 LAB — HIV ANTIBODY (ROUTINE TESTING W REFLEX): HIV Screen 4th Generation wRfx: NONREACTIVE

## 2020-08-26 LAB — HEPATITIS C ANTIBODY: Hep C Virus Ab: 0.1 s/co ratio (ref 0.0–0.9)

## 2020-08-26 LAB — TSH: TSH: 1.28 u[IU]/mL (ref 0.450–4.500)

## 2020-08-26 MED ORDER — ATORVASTATIN CALCIUM 40 MG PO TABS: 40.0000 mg | ORAL_TABLET | Freq: Every day | ORAL | 0 refills | Status: DC

## 2020-08-26 NOTE — Progress Notes (Signed)
Thyroid function normal.   No anemia.   Hepatitis C negative.   HIV negative.   Hemoglobin A1c is consistent with pre-diabetes. Practice healthy eating habits of fresh fruit and vegetables, lean baked meats such as chicken, fish, and [place]; limit breads, rice, pastas, and desserts; practice regular aerobic exercise (at least 150 minutes a week as tolerated). No medication needed at the moment. Encouraged to recheck in 6 months.   Cholesterol higher than expected. High cholesterol may increase risk of heart attack and/or stroke. Consider eating more fruits, vegetables, and lean baked meats such as chicken or fish. Moderate intensity exercise at least 150 minutes as tolerated per week may help as well. Begin Atorvastatin for high cholesterol. Encouraged to recheck in 6 weeks at fasting lab visit only.  The following is for provider reference only: The 10-year ASCVD risk score is: 8.2%   Values used to calculate the score:     Age: 55 years     Sex: Female     Is Non-Hispanic African American: Yes     Diabetic: No     Tobacco smoker: No     Systolic Blood Pressure: 492 mmHg     Is BP treated: Yes     HDL Cholesterol: 42 mg/dL     Total Cholesterol: 189 mg/dL

## 2020-09-03 ENCOUNTER — Encounter: Payer: Self-pay | Admitting: Physician Assistant

## 2020-09-06 NOTE — Progress Notes (Signed)
Virtual Visit via Telephone Note  I connected with Darlene Crosby, on 09/07/2020 at 7:46 AM by telephone due to the COVID-19 pandemic and verified that I am speaking with the correct person using two identifiers.  Due to current restrictions/limitations of in-office visits due to the COVID-19 pandemic, this scheduled clinical appointment was converted to a telehealth visit.   Consent: I discussed the limitations, risks, security and privacy concerns of performing an evaluation and management service by telephone and the availability of in person appointments. I also discussed with the patient that there may be a patient responsible charge related to this service. The patient expressed understanding and agreed to proceed.   Location of Patient: Home  Location of Provider: [facility] [place] participating in Telemedicine visit: [place], NP Elmon Else, CMA   History of Present Illness: Darlene Crosby is a 55 year-old female who presents for hypertension follow-up.   HYPERTENSION FOLLOW-UP: 08/25/2020: - Blood pressure today in office close to goal. - Home blood pressures overall close to goal. - Amlodipine at the current dosage causing bilateral ankle swelling. - Decrease Amlodipine from 10 mg daily to 5 mg daily. - Patient intolerant to Lisinopril, Losartan, and Hydrochlorothiazide. - Will hold adding a second medication at the moment. - Follow-up with primary provider in 2 weeks or sooner if needed for blood pressure check.  09/07/2020: Doing ok on current regimen. Initially was having some cramping but drinking plenty of water helped. Still having bilateral ankle swelling. Believes this may be related to standing too long and the type of shoes she is wearing. Planning to see a foot doctor soon to make sure there is nothing more going on with the feet. Currently taking: see medication list Med Adherence: [x]  Yes     []  No Adherence with salt restriction (low-salt diet): [x]  Yes  []  No Exercise: Yes [x]  as tolerated related to back pain Home Monitoring?: [x]  Yes    []  No Monitoring Frequency: [x]  Yes    []  No Home BP results range: [x]  Yes 130's/70's   Past Medical History:  Diagnosis Date   Asthma    followed by pcp   Hypertension    OA (osteoarthritis)    left knee, right shoulder, left hip   Seasonal allergies    Wears glasses    Allergies  Allergen Reactions   Pineapple Shortness Of Breath, Swelling and Hives    Swelling of tongue    Chocolate Hives   Coconut Oil Hives and Rash   Strawberry Extract Hives   Wheat Bran Hives    Current Outpatient Medications on File Prior to Visit  Medication Sig Dispense Refill   albuterol (PROVENTIL) (2.5 MG/3ML) 0.083% nebulizer solution Inhale 2.5 mg into the lungs every 4 (four) hours as needed for wheezing or shortness of breath.      albuterol (VENTOLIN HFA) 108 (90 Base) MCG/ACT inhaler Inhale 2 puffs into the lungs every 4 (four) hours as needed (wheezing and SOB).      amLODipine (NORVASC) 5 MG tablet Take 1 tablet (5 mg total) by mouth daily. 90 tablet 0   atorvastatin (LIPITOR) 40 MG tablet Take 1 tablet (40 mg total) by mouth daily. 90 tablet 0   cyclobenzaprine (FLEXERIL) 10 MG tablet Take 10 mg by mouth daily as needed.     Elastic Bandages & Supports (LUMBAR BACK BRACE/SUPPORT PAD) MISC 1 each by Does not apply route as needed. 1 each  0   ibuprofen (ADVIL,MOTRIN) 200 MG tablet Take 600 mg by mouth 2 (two) times daily as needed for moderate pain.     levocetirizine (XYZAL) 5 MG tablet Take 5 mg by mouth every evening.     Multiple Vitamin (MULTIVITAMIN) tablet Take 1 tablet by mouth daily.     tiZANidine (ZANAFLEX) 4 MG tablet Take 1 tablet (4 mg total) by mouth at bedtime. 30 tablet 1   triamcinolone (KENALOG) 0.1 % Apply 1 application topically 2 (two) times daily. 80 g 0   No current facility-administered medications on file prior to  visit.    Observations/Objective: Alert and oriented x 3. Not in acute distress. Physical examination not completed as this is a telemedicine visit.  Assessment and Plan: 1. Essential hypertension: - Home blood pressures are controlled. - Continue Amlodipine as prescribed.  - Patient still having bilateral ankle swelling. Reports she is able to manage for now but would like to see a foot doctor to make sure there is nothing more going on with the feet.  - Counseled patient that Amlodipine is likely the offender of bilateral ankle swelling and to let me know if this becomes unmanageable. Patient agreeable.   - Patient intolerant to Lisinopril, Losartan, and Hydrochlorothiazide. - Considering patient's history of intolerance of multiple blood pressure medications may consider referral to Cardiology as a next step if patient is unable to remain on Amlodipine. Patient agreeable.  - Counseled on blood pressure goal of less than 130/80, low-sodium, DASH diet, medication compliance, 150 minutes of moderate intensity exercise per week as tolerated. Discussed medication compliance, adverse effects. - Follow-up with primary provider in 3 months or sooner if needed.  - amLODipine (NORVASC) 5 MG tablet; Take 1 tablet (5 mg total) by mouth daily.  Dispense: 90 tablet; Refill: 0  2. Podiatry visit, routine: - Per patient request referral to Podiatry for further evaluation and management.  - Ambulatory referral to Podiatry   Follow Up Instructions: Follow-up with primary provider in 3 months or sooner if needed.    Patient was given clear instructions to go to Emergency Department or return to medical center if symptoms don't improve, worsen, or new problems develop.The patient verbalized understanding.  I discussed the assessment and treatment plan with the patient. The patient was provided an opportunity to ask questions and all were answered. The patient agreed with the plan and demonstrated an  understanding of the instructions.   The patient was advised to call back or seek an in-person evaluation if the symptoms worsen or if the condition fails to improve as anticipated.     I provided 10 minutes total of non-face-to-face time during this encounter.   Camillia Herter, NP  [facility] [place], [place] 09/07/2020, 7:46 AM

## 2020-09-07 ENCOUNTER — Telehealth (INDEPENDENT_AMBULATORY_CARE_PROVIDER_SITE_OTHER): Payer: 59 | Admitting: Family

## 2020-09-07 ENCOUNTER — Other Ambulatory Visit: Payer: Self-pay

## 2020-09-07 DIAGNOSIS — Z008 Encounter for other general examination: Secondary | ICD-10-CM | POA: Diagnosis not present

## 2020-09-07 DIAGNOSIS — I10 Essential (primary) hypertension: Secondary | ICD-10-CM

## 2020-09-07 MED ORDER — AMLODIPINE BESYLATE 5 MG PO TABS: 5.0000 mg | ORAL_TABLET | Freq: Every day | ORAL | 0 refills | Status: DC

## 2020-09-17 ENCOUNTER — Ambulatory Visit (INDEPENDENT_AMBULATORY_CARE_PROVIDER_SITE_OTHER): Payer: 59

## 2020-09-17 ENCOUNTER — Encounter: Payer: Self-pay | Admitting: Physician Assistant

## 2020-09-17 ENCOUNTER — Other Ambulatory Visit: Payer: Self-pay

## 2020-09-17 ENCOUNTER — Ambulatory Visit (INDEPENDENT_AMBULATORY_CARE_PROVIDER_SITE_OTHER): Payer: 59 | Admitting: Physician Assistant

## 2020-09-17 ENCOUNTER — Ambulatory Visit: Payer: 59 | Admitting: Podiatry

## 2020-09-17 VITALS — BP 128/84 | HR 63 | Ht 65.0 in | Wt 228.2 lb

## 2020-09-17 DIAGNOSIS — M7751 Other enthesopathy of right foot: Secondary | ICD-10-CM | POA: Diagnosis not present

## 2020-09-17 DIAGNOSIS — M25471 Effusion, right ankle: Secondary | ICD-10-CM | POA: Diagnosis not present

## 2020-09-17 DIAGNOSIS — M7752 Other enthesopathy of left foot: Secondary | ICD-10-CM

## 2020-09-17 DIAGNOSIS — M25472 Effusion, left ankle: Secondary | ICD-10-CM | POA: Diagnosis not present

## 2020-09-17 DIAGNOSIS — Z1211 Encounter for screening for malignant neoplasm of colon: Secondary | ICD-10-CM

## 2020-09-17 MED ORDER — MELOXICAM 7.5 MG PO TABS: 7.5000 mg | ORAL_TABLET | Freq: Every day | ORAL | 0 refills | Status: DC | PRN

## 2020-09-17 NOTE — Patient Instructions (Addendum)
Look at getting an over the counter knee high compression sock  Plantar Fasciitis (Heel Spur Syndrome) with Rehab The plantar fascia is a fibrous, ligament-like, soft-tissue structure that spans the bottom of the foot. Plantar fasciitis is a condition that causes pain in the foot due to inflammation of the tissue. SYMPTOMS  Pain and tenderness on the underneath side of the foot. Pain that worsens with standing or walking. CAUSES  Plantar fasciitis is caused by irritation and injury to the plantar fascia on the underneath side of the foot. Common mechanisms of injury include: Direct trauma to bottom of the foot. Damage to a small nerve that runs under the foot where the main fascia attaches to the heel bone. Stress placed on the plantar fascia due to bone spurs. RISK INCREASES WITH:  Activities that place stress on the plantar fascia (running, jumping, pivoting, or cutting). Poor strength and flexibility. Improperly fitted shoes. Tight calf muscles. Flat feet. Failure to warm-up properly before activity. Obesity. PREVENTION Warm up and stretch properly before activity. Allow for adequate recovery between workouts. Maintain physical fitness: Strength, flexibility, and endurance. Cardiovascular fitness. Maintain a health body weight. Avoid stress on the plantar fascia. Wear properly fitted shoes, including arch supports for individuals who have flat feet.  PROGNOSIS  If treated properly, then the symptoms of plantar fasciitis usually resolve without surgery. However, occasionally surgery is necessary.  RELATED COMPLICATIONS  Recurrent symptoms that may result in a chronic condition. Problems of the lower back that are caused by compensating for the injury, such as limping. Pain or weakness of the foot during push-off following surgery. Chronic inflammation, scarring, and partial or complete fascia tear, occurring more often from repeated injections.  TREATMENT  Treatment  initially involves the use of ice and medication to help reduce pain and inflammation. The use of strengthening and stretching exercises may help reduce pain with activity, especially stretches of the Achilles tendon. These exercises may be performed at home or with a therapist. Your caregiver may recommend that you use heel cups of arch supports to help reduce stress on the plantar fascia. Occasionally, corticosteroid injections are given to reduce inflammation. If symptoms persist for greater than 6 months despite non-surgical (conservative), then surgery may be recommended.   MEDICATION  If pain medication is necessary, then nonsteroidal anti-inflammatory medications, such as aspirin and ibuprofen, or other minor pain relievers, such as acetaminophen, are often recommended. Do not take pain medication within 7 days before surgery. Prescription pain relievers may be given if deemed necessary by your caregiver. Use only as directed and only as much as you need. Corticosteroid injections may be given by your caregiver. These injections should be reserved for the most serious cases, because they may only be given a certain number of times.  HEAT AND COLD Cold treatment (icing) relieves pain and reduces inflammation. Cold treatment should be applied for 10 to 15 minutes every 2 to 3 hours for inflammation and pain and immediately after any activity that aggravates your symptoms. Use ice packs or massage the area with a piece of ice (ice massage). Heat treatment may be used prior to performing the stretching and strengthening activities prescribed by your caregiver, physical therapist, or athletic trainer. Use a heat pack or soak the injury in warm water.  SEEK IMMEDIATE MEDICAL CARE IF: Treatment seems to offer no benefit, or the condition worsens. Any medications produce adverse side effects.  EXERCISES- RANGE OF MOTION (ROM) AND STRETCHING EXERCISES - Plantar Fasciitis (Heel Spur Syndrome) These  exercises may help you when beginning to rehabilitate your injury. Your symptoms may resolve with or without further involvement from your physician, physical therapist or athletic trainer. While completing these exercises, remember:  Restoring tissue flexibility helps normal motion to return to the joints. This allows healthier, less painful movement and activity. An effective stretch should be held for at least 30 seconds. A stretch should never be painful. You should only feel a gentle lengthening or release in the stretched tissue.  RANGE OF MOTION - Toe Extension, Flexion Sit with your right / left leg crossed over your opposite knee. Grasp your toes and gently pull them back toward the top of your foot. You should feel a stretch on the bottom of your toes and/or foot. Hold this stretch for 10 seconds. Now, gently pull your toes toward the bottom of your foot. You should feel a stretch on the top of your toes and or foot. Hold this stretch for 10 seconds. Repeat  times. Complete this stretch 3 times per day.   RANGE OF MOTION - Ankle Dorsiflexion, Active Assisted Remove shoes and sit on a chair that is preferably not on a carpeted surface. Place right / left foot under knee. Extend your opposite leg for support. Keeping your heel down, slide your right / left foot back toward the chair until you feel a stretch at your ankle or calf. If you do not feel a stretch, slide your bottom forward to the edge of the chair, while still keeping your heel down. Hold this stretch for 10 seconds. Repeat 3 times. Complete this stretch 2 times per day.   STRETCH  Gastroc, Standing Place hands on wall. Extend right / left leg, keeping the front knee somewhat bent. Slightly point your toes inward on your back foot. Keeping your right / left heel on the floor and your knee straight, shift your weight toward the wall, not allowing your back to arch. You should feel a gentle stretch in the right / left calf.  Hold this position for 10 seconds. Repeat 3 times. Complete this stretch 2 times per day.  STRETCH  Soleus, Standing Place hands on wall. Extend right / left leg, keeping the other knee somewhat bent. Slightly point your toes inward on your back foot. Keep your right / left heel on the floor, bend your back knee, and slightly shift your weight over the back leg so that you feel a gentle stretch deep in your back calf. Hold this position for 10 seconds. Repeat 3 times. Complete this stretch 2 times per day.  STRETCH  Gastrocsoleus, Standing  Note: This exercise can place a lot of stress on your foot and ankle. Please complete this exercise only if specifically instructed by your caregiver.  Place the ball of your right / left foot on a step, keeping your other foot firmly on the same step. Hold on to the wall or a rail for balance. Slowly lift your other foot, allowing your body weight to press your heel down over the edge of the step. You should feel a stretch in your right / left calf. Hold this position for 10 seconds. Repeat this exercise with a slight bend in your right / left knee. Repeat 3 times. Complete this stretch 2 times per day.   STRENGTHENING EXERCISES - Plantar Fasciitis (Heel Spur Syndrome)  These exercises may help you when beginning to rehabilitate your injury. They may resolve your symptoms with or without further involvement from your physician, physical therapist  or Product/process development scientist. While completing these exercises, remember:  Muscles can gain both the endurance and the strength needed for everyday activities through controlled exercises. Complete these exercises as instructed by your physician, physical therapist or athletic trainer. Progress the resistance and repetitions only as guided.  STRENGTH - Towel Curls Sit in a chair positioned on a non-carpeted surface. Place your foot on a towel, keeping your heel on the floor. Pull the towel toward your heel by only  curling your toes. Keep your heel on the floor. Repeat 3 times. Complete this exercise 2 times per day.  STRENGTH - Ankle Inversion Secure one end of a rubber exercise band/tubing to a fixed object (table, pole). Loop the other end around your foot just before your toes. Place your fists between your knees. This will focus your strengthening at your ankle. Slowly, pull your big toe up and in, making sure the band/tubing is positioned to resist the entire motion. Hold this position for 10 seconds. Have your muscles resist the band/tubing as it slowly pulls your foot back to the starting position. Repeat 3 times. Complete this exercises 2 times per day.  Document Released: 02/14/2005 Document Revised: 05/09/2011 Document Reviewed: 05/29/2008 [facility] Patient Information 2014 [place], [place]. Meloxicam tablets What is this medication? MELOXICAM (mel OX i cam) is a non-steroidal anti-inflammatory drug, also knownas an NSAID. It is used to treat pain, inflammation, and swelling. This medicine may be used for other purposes; ask your health care provider orpharmacist if you have questions. COMMON BRAND NAME(S): Mobic What should I tell my care team before I take this medication? They need to know if you have any of these conditions: asthma (lung or breathing disease) bleeding disorder coronary artery bypass graft (CABG) within the past 2 weeks dehydration heart attack heart disease heart failure high blood pressure if you often drink alcohol kidney disease liver disease smoke tobacco cigarettes stomach bleeding stomach ulcers, other stomach or intestine problems take medicines that treat or prevent blood clots taking other steroids like dexamethasone or prednisone an unusual or allergic reaction to meloxicam, other medicines, foods, dyes, or preservatives pregnant or trying to get pregnant breast-feeding How should I use this medication? Take this medicine by mouth. Take it as  directed on the prescription label at the same time every day. You can take it with or without food. If it upsets your stomach, take it with food. Do not use it more often than directed. There may be unused or extra doses in the bottle after you finish your treatment.Talk to your health care provider if you have questions about your dose. A special MedGuide will be given to you by the pharmacist with eachprescription and refill. Be sure to read this information carefully each time. Talk to your health care provider about the use of this medicine in children.Special care may be needed. Patients over 42 years of age may have a stronger reaction and need a smallerdose. Overdosage: If you think you have taken too much of this medicine contact apoison control center or emergency room at once. NOTE: This medicine is only for you. Do not share this medicine with others. What if I miss a dose? If you miss a dose, take it as soon as you can. If it is almost time for yournext dose, take only that dose. Do not take double or extra doses. What may interact with this medication? Do not take this medicine with any of the following medications: cidofovir ketorolac This medicine may also interact  with the following medications: aspirin and aspirin-like medicines certain medicines for blood pressure, heart disease, irregular heart beat certain medicines for depression, anxiety, or psychotic disturbances certain medicines that treat or prevent blood clots like warfarin, enoxaparin, dalteparin, apixaban, dabigatran, rivaroxaban cyclosporine diuretics fluconazole lithium methotrexate other NSAIDs, medicines for pain and inflammation, like ibuprofen and naproxen pemetrexed This list may not describe all possible interactions. Give your health care provider a list of all the medicines, herbs, non-prescription drugs, or dietary supplements you use. Also tell them if you smoke, drink alcohol, or use illegaldrugs.  Some items may interact with your medicine. What should I watch for while using this medication? Visit your health care provider for regular checks on your progress. Tell your health care provider if your symptoms do not start to get better or if they getworse. Do not take other medicines that contain aspirin, ibuprofen, or naproxen with this medicine. Side effects such as stomach upset, nausea, or ulcers may be more likely to occur. Many non-prescription medicines contain aspirin,ibuprofen, or naproxen. Always read labels carefully. This medicine can cause serious ulcers and bleeding in the stomach. It can happen with no warning. Smoking, drinking alcohol, older age, and poor health can also increase risks. Call your health care provider right away if you havestomach pain or blood in your vomit or stool. This medicine does not prevent a heart attack or stroke. This medicine may increase the chance of a heart attack or stroke. The chance may increase the longer you use this medicine or if you have heart disease. If you take aspirin to prevent a heart attack or stroke, talk to your health care provider aboutusing this medicine. Alcohol may interfere with the effect of this medicine. Avoid alcoholic drinks. This medicine may cause serious skin reactions. They can happen weeks to months after starting the medicine. Contact your health care provider right away if you notice fevers or flu-like symptoms with a rash. The rash may be red or purple and then turn into blisters or peeling of the skin. Or, you might notice a red rash with swelling of the face, lips or lymph nodes in your neck or underyour arms. Talk to your health care provider if you are pregnant before taking this medicine. Taking this medicine between weeks 20 and 30 of pregnancy may harm your unborn baby. Your health care provider will monitor you closely if youneed to take it. After 30 weeks of pregnancy, do not take this medicine. You may get  drowsy or dizzy. Do not drive, use machinery, or do anything that needs mental alertness until you know how this medicine affects you. Do not stand up or sit up quickly, especially if you are an older patient. Thisreduces the risk of dizzy or fainting spells. Be careful brushing or flossing your teeth or using a toothpick because you may get an infection or bleed more easily. If you have any dental work done, Primary school teacher you are receiving this medicine. This medicine may make it more difficult to get pregnant. Talk to your healthcare provider if you are concerned about your fertility. What side effects may I notice from receiving this medication? Side effects that you should report to your doctor or health care professionalas soon as possible: allergic reactions (skin rash, itching or hives; swelling of the face, lips, or tongue) bleeding (bloody or black, tarry stools; red or dark brown urine; spitting up blood or brown material that looks like coffee grounds; red spots on the skin; unusual bruising  or bleeding from the eyes, gums, or nose) blood clot (chest pain; shortness of breath; pain, swelling, or warmth in the leg) general ill feeling or flu-like symptoms high potassium levels (chest pain; fast, irregular heartbeat; muscle weakness) kidney injury (trouble passing urine or change in the amount of urine) light-colored stool liver injury (dark yellow or brown urine; general ill feeling or flu-like symptoms; loss of appetite, right upper belly pain; unusually weak or tired, yellowing of the eyes or skin) low red blood cell counts (trouble breathing; feeling faint; lightheaded, falls; unusually weak or tired) rash, fever, and swollen lymph nodes redness, blistering, peeling, or loosening of the skin, including inside the mouth stroke (changes in vision; confusion; trouble speaking or understanding; severe headaches; sudden numbness or weakness of the face, arm or leg; trouble walking;  dizziness; loss of balance or coordination) Side effects that usually do not require medical attention (report to yourdoctor or health care professional if they continue or are bothersome): constipation diarrhea dizziness gas headache nausea, vomiting This list may not describe all possible side effects. Call your doctor for medical advice about side effects. You may report side effects to FDA at1-800-FDA-1088. Where should I keep my medication? Keep out of the reach of children and pets. Store at room temperature between 20 and 25 degrees C (68 and 77 degrees F).Protect from moisture. Keep the container tightly closed. Get rid of any unused medicine after the expiration date. To get rid of medicines that are no longer needed or have expired: Take the medicine to a medicine take-back program. Check with your pharmacy or law enforcement to find a location. If you cannot return the medicine, check the label or package insert to see if the medicine should be thrown out in the garbage or flushed down the toilet. If you are not sure, ask your health care provider. If it is safe to put it in the trash, empty the medicine out of the container. Mix the medicine with cat litter, dirt, coffee grounds, or other unwanted substance. Seal the mixture in a bag or container. Put it in the trash. NOTE: This sheet is a summary. It may not cover all possible information. If you have questions about this medicine, talk to your doctor, pharmacist, orhealth care provider.  2022 Elsevier/Gold Standard (2020-01-27 15:14:36)

## 2020-09-17 NOTE — Progress Notes (Addendum)
Subjective:    Patient ID: Darlene Crosby, female    DOB: 15-Aug-1965, 55 y.o.   MRN: [id number]  HPI Darlene Crosby is a pleasant 55 year old African-American female who was referred today for colon cancer screening, and consideration of colonoscopy per [name], NP. Patient relates that she did have 1 prior colonoscopy at age 50, done in [place] but does not recall the physician's name.  She does remember being told that this was a negative exam and that she did not have any polyps. She has no current GI complaints.  Specifically no problems with abdominal pain changes in bowel habits melena or hematochezia. Family history is negative for colon cancer and polyps as far she is aware. She has history of hypertension, hyperlipidemia and spinal stenosis.  Recent annual labs with unremarkable CBC and c-Met.  Review of Systems Pertinent positive and negative review of systems were noted in the above HPI section.  All other review of systems was otherwise negative.   Outpatient Encounter Medications as of 09/17/2020  Medication Sig   albuterol (PROVENTIL) (2.5 MG/3ML) 0.083% nebulizer solution Inhale 2.5 mg into the lungs every 4 (four) hours as needed for wheezing or shortness of breath.    albuterol (VENTOLIN HFA) 108 (90 Base) MCG/ACT inhaler Inhale 2 puffs into the lungs every 4 (four) hours as needed (wheezing and SOB).    amLODipine (NORVASC) 5 MG tablet Take 1 tablet (5 mg total) by mouth daily.   atorvastatin (LIPITOR) 40 MG tablet Take 1 tablet (40 mg total) by mouth daily.   cyclobenzaprine (FLEXERIL) 10 MG tablet Take 10 mg by mouth daily as needed.   Elastic Bandages & Supports (LUMBAR BACK BRACE/SUPPORT PAD) MISC 1 each by Does not apply route as needed.   ibuprofen (ADVIL,MOTRIN) 200 MG tablet Take 600 mg by mouth 2 (two) times daily as needed for moderate pain.   levocetirizine (XYZAL) 5 MG tablet Take 5 mg by mouth every evening.   meloxicam (MOBIC) 7.5 MG tablet Take 1 tablet  (7.5 mg total) by mouth daily as needed.   Multiple Vitamin (MULTIVITAMIN) tablet Take 1 tablet by mouth daily.   tiZANidine (ZANAFLEX) 4 MG tablet Take 1 tablet (4 mg total) by mouth at bedtime.   triamcinolone (KENALOG) 0.1 % Apply 1 application topically 2 (two) times daily.   No facility-administered encounter medications on file as of 09/17/2020.   Allergies  Allergen Reactions   Pineapple Shortness Of Breath, Swelling and Hives    Swelling of tongue    Chocolate Hives   Coconut Oil Hives and Rash   Strawberry Extract Hives   Wheat Bran Hives   Patient Active Problem List   Diagnosis Date Noted   Body mass index (BMI) 37.0-37.9, adult 03/14/2019   Lumbar stenosis without neurogenic claudication 03/14/2019   Hyperlipidemia 02/08/2019   Lumbar spondylosis 10/25/2018   Degeneration of lumbar intervertebral disc 10/01/2018   Surgical wound dehiscence 06/06/2018   Osteoarthritis of left hip 04/25/2018   Spinal stenosis of lumbar region 02/23/2018   Trigger thumb of right hand 03/30/2017   Dyspnea 03/26/2013   Asthma vs VCD  02/28/2013   Essential (primary) hypertension 06/07/2012   Pyogenic granuloma 02/15/2012   Social History   Socioeconomic History   Marital status: Widowed    Spouse name: Not on file   Number of children: Not on file   Years of education: Not on file   Highest education level: Not on file  Occupational History   Not on file  Tobacco Use   Smoking status: Never   Smokeless tobacco: Never  Vaping Use   Vaping Use: Never used  Substance and Sexual Activity   Alcohol use: No   Drug use: Never   Sexual activity: Not on file  Other Topics Concern   Not on file  Social History Narrative   Not on file   Social Determinants of Health   Financial Resource Strain: Not on file  Food Insecurity: Not on file  Transportation Needs: Not on file  Physical Activity: Not on file  Stress: Not on file  Social Connections: Not on file  Intimate Partner  Violence: Not on file    Darlene Crosby's family history includes Allergies in her mother; Asthma in her mother; Breast cancer in her sister; Diabetes in her sister; Heart disease in her sister; Hypertension in her mother and sister; Stroke in her sister.      Objective:    Vitals:   09/17/20 1440  BP: 128/84  Pulse: 63  SpO2: 97%    Physical Exam  Well-developed well-nourished  AA female  in no acute distress.  Height, Weight,228  BMI 37.9  HEENT; nontraumatic normocephalic, EOMI, PE R LA, sclera anicteric.  Extremities; no clubbing cyanosis or edema skin warm and dry Neuro/Psych; alert and oriented x4, grossly nonfocal mood and affect appropriate        Assessment & Plan:   #57 55 year old African-American female referred for consideration of colonoscopy/colon screening. Patient had a colonoscopy at age 89 and was told this was negative exam. She is currently asymptomatic with no GI complaints, and average risk with no family history of colon cancer or polyps.  #2 hypertension #3.  Spinal stenosis #4.  Hyperlipidemia  Plan Patient has signed a release and we have attempted to get a copy of her colonoscopy and/or path from [facility]/ Dr. Adriana Mccallum /and Dr. Earlean Shawl.  We have been unable to get any confirmation this afternoon. Once records have been obtained and reviewed we will contact patient, and if colonoscopy is indicated at this time she will be scheduled.  In fact her colonoscopy was negative then she would not be due for follow-up colonoscopy until 2025.  Addendum-have received copy of prior colonoscopy which was done per Dr. Earlean Shawl 10/17/2013 for screening.  This showed mild diverticulosis in the rectosigmoid colon, otherwise normal exam and was recommended for 10-year interval follow-up.  Will place patient in system for recall colonoscopy August 2025.   Darlene Crosby Darlene Harold PA-C 09/17/2020   Cc: Camillia Herter, NP

## 2020-09-17 NOTE — Patient Instructions (Signed)
If you are age 57 or younger, your body mass index should be between 19-25. Your Body mass index is 37.98 kg/m. If this is out of the aformentioned range listed, please consider follow up with your Primary Care Provider.  __________________________________________________________  The [facility] providers would like to encourage you to use [facility] to communicate with providers for non-urgent requests or questions.  Due to long hold times on the telephone, sending your provider a message by [facility] may be a faster and more efficient way to get a response.  Please allow 48 business hours for a response.  Please remember that this is for non-urgent requests.   We are working to get records to see if you are needing to have a colonoscopy. Once we receive records will contact you to schedule.  Thank you for entrusting me with your care and choosing [facility].  Amy Esterwood, PA-C

## 2020-09-21 NOTE — Progress Notes (Signed)
Subjective:   Patient ID: Darlene Crosby, female   DOB: 55 y.o.   MRN: [id number]   HPI 55 year old female presents the office with concerns of ankle swelling.  She states that this started after she started amlodipine.  Patient was on 10 mg which is down to 5 mg as well as swelling.  She does get some soreness associated with swelling.  She tries to keep it elevated at night to reduce the swelling.  No recent injury or trauma.  No other treatments.  No other concerns.   Review of Systems  All other systems reviewed and are negative.  Past Medical History:  Diagnosis Date   Asthma    followed by pcp   Hypertension    OA (osteoarthritis)    left knee, right shoulder, left hip   Seasonal allergies    Wears glasses     Past Surgical History:  Procedure Laterality Date   ANTERIOR HIP REVISION Left 06/06/2018   Procedure: LEFT HIP WOUND DEHISCENCE POSSIBLE HEAD AND LINER EXCHANGE;  Surgeon: Rod Can, MD;  Location: [facility];  Service: Orthopedics;  Laterality: Left;   COLONOSCOPY  2015   DIAGNOSTIC LAPAROSCOPY     tubal preg-took ovary and tube   LAPAROSCOPY FOR ECTOPIC PREGNANCY  1990s   LEFT SALPINGECTOMY AND RIGHT OOPHORECTOMY FOR ABNORMALITY   MASS EXCISION  02/15/2012   Procedure: EXCISION MASS;  Surgeon: Schuyler Amor, MD;  Location: [facility];  Service: Orthopedics;  Laterality: Right;  Excision of Right Small Volar Mass   MYELOGRAM     TONSILLECTOMY  age 89   TOTAL HIP ARTHROPLASTY Left 04/25/2018   Procedure: TOTAL HIP ARTHROPLASTY ANTERIOR APPROACH;  Surgeon: Rod Can, MD;  Location: [facility];  Service: Orthopedics;  Laterality: Left;   TRIGGER FINGER RELEASE Right 2018   thumb   WISDOM TOOTH EXTRACTION  age 82     Current Outpatient Medications:    meloxicam (MOBIC) 7.5 MG tablet, Take 1 tablet (7.5 mg total) by mouth daily as needed., Disp: 14 tablet, Rfl: 0   albuterol (PROVENTIL) (2.5 MG/3ML) 0.083% nebulizer solution, Inhale 2.5  mg into the lungs every 4 (four) hours as needed for wheezing or shortness of breath. , Disp: , Rfl:    albuterol (VENTOLIN HFA) 108 (90 Base) MCG/ACT inhaler, Inhale 2 puffs into the lungs every 4 (four) hours as needed (wheezing and SOB). , Disp: , Rfl:    amLODipine (NORVASC) 5 MG tablet, Take 1 tablet (5 mg total) by mouth daily., Disp: 90 tablet, Rfl: 0   atorvastatin (LIPITOR) 40 MG tablet, Take 1 tablet (40 mg total) by mouth daily., Disp: 90 tablet, Rfl: 0   cyclobenzaprine (FLEXERIL) 10 MG tablet, Take 10 mg by mouth daily as needed., Disp: , Rfl:    Elastic Bandages & Supports (LUMBAR BACK BRACE/SUPPORT PAD) MISC, 1 each by Does not apply route as needed., Disp: 1 each, Rfl: 0   ibuprofen (ADVIL,MOTRIN) 200 MG tablet, Take 600 mg by mouth 2 (two) times daily as needed for moderate pain., Disp: , Rfl:    levocetirizine (XYZAL) 5 MG tablet, Take 5 mg by mouth every evening., Disp: , Rfl:    Multiple Vitamin (MULTIVITAMIN) tablet, Take 1 tablet by mouth daily., Disp: , Rfl:    tiZANidine (ZANAFLEX) 4 MG tablet, Take 1 tablet (4 mg total) by mouth at bedtime., Disp: 30 tablet, Rfl: 1   triamcinolone (KENALOG) 0.1 %, Apply 1 application topically 2 (two) times daily., Disp: 80  g, Rfl: 0  Allergies  Allergen Reactions   Pineapple Shortness Of Breath, Swelling and Hives    Swelling of tongue    Chocolate Hives   Coconut Oil Hives and Rash   Strawberry Extract Hives   Wheat Bran Hives          Objective:  Physical Exam  General: AAO x3, NAD  Dermatological: Skin is warm, dry and supple bilateral. There are no open sores, no preulcerative lesions, no rash or signs of infection present.  Vascular: Dorsalis Pedis artery and Posterior Tibial artery pedal pulses are 2/4 bilateral with immedate capillary fill time. There is no pain with calf compression, swelling, warmth, erythema.   Neruologic: Grossly intact via light touch bilateral.   Musculoskeletal: Edema present to bilateral  ankles.  She does have some slight discomfort on the medial aspect along the course of the flexor tendons bilaterally as well as the anterior ankle joint line.  There is no area pinpoint tenderness.  Achilles tendon, flexor, extensor tendons appear to be intact.  Muscular strength 5/5 in all groups tested bilateral.  Gait: Unassisted, Nonantalgic.       Assessment:   Bilateral ankle swelling likely from amlodipine; tendinitis     Plan:  -Treatment options discussed including all alternatives, risks, and complications -Etiology of symptoms were discussed -X-rays were obtained and reviewed with the patient.  No evidence of fracture or stress fracture noted. -I do think her symptoms are mostly coming from amlodipine however she does have some tenderness we will treat for tendinitis.  Prescribed meloxicam discussed side effects.  Discussed wearing, icing daily.  Discussed compression socks.  Trula Slade DPM

## 2020-09-21 NOTE — Progress Notes (Signed)
____________________________________________________________  Attending physician addendum:  Thank you for sending this case to me. I have reviewed the entire note and agree with the plan.  Thank you for getting the records to clarify.  Wilfrid Lund, MD  ____________________________________________________________

## 2020-10-05 ENCOUNTER — Ambulatory Visit (INDEPENDENT_AMBULATORY_CARE_PROVIDER_SITE_OTHER): Payer: 59

## 2020-10-05 ENCOUNTER — Other Ambulatory Visit: Payer: Self-pay

## 2020-10-05 DIAGNOSIS — Z23 Encounter for immunization: Secondary | ICD-10-CM | POA: Diagnosis not present

## 2020-10-31 ENCOUNTER — Encounter: Payer: Self-pay | Admitting: General Practice

## 2020-10-31 ENCOUNTER — Ambulatory Visit
Admission: EM | Admit: 2020-10-31 | Discharge: 2020-10-31 | Disposition: A | Discharge status: Home / Self Care | Payer: 59 | Attending: Urgent Care | Admitting: Urgent Care

## 2020-10-31 DIAGNOSIS — W57XXXA Bitten or stung by nonvenomous insect and other nonvenomous arthropods, initial encounter: Secondary | ICD-10-CM

## 2020-10-31 DIAGNOSIS — L249 Irritant contact dermatitis, unspecified cause: Secondary | ICD-10-CM

## 2020-10-31 DIAGNOSIS — S80862A Insect bite (nonvenomous), left lower leg, initial encounter: Secondary | ICD-10-CM

## 2020-10-31 MED ORDER — BETAMETHASONE DIPROPIONATE 0.05 % EX OINT: TOPICAL_OINTMENT | Freq: Two times a day (BID) | CUTANEOUS | 0 refills | Status: DC

## 2020-10-31 NOTE — ED Triage Notes (Signed)
3 gays ago, got bitten by an infect, reddish color around the bite site of the left leg, thinks there is a stinger, did not see the insect but felt the bite. Used triple antibiotic cream with no relief, used bee sting pad with no relief as well.

## 2020-10-31 NOTE — ED Provider Notes (Signed)
[facility]   MRN: [id number] DOB: 1966-01-27  Subjective:   Darlene Crosby is a 55 y.o. female presenting for 3-day history of persistent rash over the left lateral portion of her leg.  She was seen at a different practice, prescribed triamcinolone cream which she has used.  Denies fever, drainage of pus or bleeding, tenderness.  No current facility-administered medications for this encounter.  Current Outpatient Medications:    albuterol (PROVENTIL) (2.5 MG/3ML) 0.083% nebulizer solution, Inhale 2.5 mg into the lungs every 4 (four) hours as needed for wheezing or shortness of breath. , Disp: , Rfl:    albuterol (VENTOLIN HFA) 108 (90 Base) MCG/ACT inhaler, Inhale 2 puffs into the lungs every 4 (four) hours as needed (wheezing and SOB). , Disp: , Rfl:    amLODipine (NORVASC) 5 MG tablet, Take 1 tablet (5 mg total) by mouth daily., Disp: 90 tablet, Rfl: 0   atorvastatin (LIPITOR) 40 MG tablet, Take 1 tablet (40 mg total) by mouth daily., Disp: 90 tablet, Rfl: 0   cyclobenzaprine (FLEXERIL) 10 MG tablet, Take 10 mg by mouth daily as needed., Disp: , Rfl:    ibuprofen (ADVIL,MOTRIN) 200 MG tablet, Take 600 mg by mouth 2 (two) times daily as needed for moderate pain., Disp: , Rfl:    levocetirizine (XYZAL) 5 MG tablet, Take 5 mg by mouth every evening., Disp: , Rfl:    meloxicam (MOBIC) 7.5 MG tablet, Take 1 tablet (7.5 mg total) by mouth daily as needed., Disp: 14 tablet, Rfl: 0   Multiple Vitamin (MULTIVITAMIN) tablet, Take 1 tablet by mouth daily., Disp: , Rfl:    tiZANidine (ZANAFLEX) 4 MG tablet, Take 1 tablet (4 mg total) by mouth at bedtime., Disp: 30 tablet, Rfl: 1   triamcinolone (KENALOG) 0.1 %, Apply 1 application topically 2 (two) times daily., Disp: 80 g, Rfl: 0   Allergies  Allergen Reactions   Pineapple Shortness Of Breath, Swelling and Hives    Swelling of tongue    Chocolate Hives   Coconut Oil Hives and Rash   Strawberry Extract Hives   Wheat Bran Hives     Past Medical History:  Diagnosis Date   Asthma    followed by pcp   Hypertension    OA (osteoarthritis)    left knee, right shoulder, left hip   Seasonal allergies    Wears glasses      Past Surgical History:  Procedure Laterality Date   ANTERIOR HIP REVISION Left 06/06/2018   Procedure: LEFT HIP WOUND DEHISCENCE POSSIBLE HEAD AND LINER EXCHANGE;  Surgeon: Rod Can, MD;  Location: [facility];  Service: Orthopedics;  Laterality: Left;   COLONOSCOPY  2015   DIAGNOSTIC LAPAROSCOPY     tubal preg-took ovary and tube   LAPAROSCOPY FOR ECTOPIC PREGNANCY  1990s   LEFT SALPINGECTOMY AND RIGHT OOPHORECTOMY FOR ABNORMALITY   MASS EXCISION  02/15/2012   Procedure: EXCISION MASS;  Surgeon: Schuyler Amor, MD;  Location: [facility];  Service: Orthopedics;  Laterality: Right;  Excision of Right Small Volar Mass   MYELOGRAM     TONSILLECTOMY  age 70   TOTAL HIP ARTHROPLASTY Left 04/25/2018   Procedure: TOTAL HIP ARTHROPLASTY ANTERIOR APPROACH;  Surgeon: Rod Can, MD;  Location: [facility];  Service: Orthopedics;  Laterality: Left;   TRIGGER FINGER RELEASE Right 2018   thumb   WISDOM TOOTH EXTRACTION  age 49    Family History  Problem Relation Age of Onset   Hypertension Mother  Asthma Mother    Allergies Mother    Breast cancer Sister    Heart disease Sister    Hypertension Sister    Stroke Sister    Diabetes Sister    Colon cancer Neg Hx    Esophageal cancer Neg Hx    Stomach cancer Neg Hx    Pancreatic cancer Neg Hx     Social History   Tobacco Use   Smoking status: Never   Smokeless tobacco: Never  Vaping Use   Vaping Use: Never used  Substance Use Topics   Alcohol use: No   Drug use: Never    ROS   Objective:   Vitals: BP (!) 161/99 Comment: did not take blood pressure medication today  Pulse 90   Temp 97.8 F (36.6 C)   Resp 18   LMP 05/04/2012   SpO2 93%   Physical Exam Constitutional:      General: She is not in acute  distress.    Appearance: Normal appearance. She is well-developed. She is not ill-appearing, toxic-appearing or diaphoretic.  HENT:     Head: Normocephalic and atraumatic.     Nose: Nose normal.     Mouth/Throat:     Mouth: Mucous membranes are moist.     Pharynx: Oropharynx is clear.  Eyes:     General: No scleral icterus.       Right eye: No discharge.        Left eye: No discharge.     Extraocular Movements: Extraocular movements intact.     Conjunctiva/sclera: Conjunctivae normal.     Pupils: Pupils are equal, round, and reactive to light.  Cardiovascular:     Rate and Rhythm: Normal rate.  Pulmonary:     Effort: Pulmonary effort is normal.  Skin:    General: Skin is warm and dry.       Neurological:     General: No focal deficit present.     Mental Status: She is alert and oriented to person, place, and time.     Motor: No weakness.     Coordination: Coordination normal.     Gait: Gait normal.     Deep Tendon Reflexes: Reflexes normal.  Psychiatric:        Mood and Affect: Mood normal.        Behavior: Behavior normal.        Thought Content: Thought content normal.        Judgment: Judgment normal.    Assessment and Plan :   PDMP not reviewed this encounter.  1. Irritant contact dermatitis, unspecified trigger   2. Insect bite of left lower leg, initial encounter     Will manage for an irritant contact dermatitis likely secondary to an insect bite/sting.  I do believe that patient is doing well with triamcinolone but she would like something stronger and therefore will use betamethasone.  I do not suspect that this is an infection as there is minimal tenderness, no warmth, drainage of pus or swelling, induration. Counseled patient on potential for adverse effects with medications prescribed/recommended today, ER and return-to-clinic precautions discussed, patient verbalized understanding.    Jaynee Eagles, PA-C 10/31/20 1555

## 2020-11-19 ENCOUNTER — Ambulatory Visit (INDEPENDENT_AMBULATORY_CARE_PROVIDER_SITE_OTHER): Payer: 59 | Admitting: Podiatry

## 2020-11-19 ENCOUNTER — Other Ambulatory Visit: Payer: Self-pay | Admitting: Family

## 2020-11-19 ENCOUNTER — Other Ambulatory Visit: Payer: Self-pay

## 2020-11-19 DIAGNOSIS — M7751 Other enthesopathy of right foot: Secondary | ICD-10-CM

## 2020-11-19 DIAGNOSIS — R609 Edema, unspecified: Secondary | ICD-10-CM

## 2020-11-19 DIAGNOSIS — M76829 Posterior tibial tendinitis, unspecified leg: Secondary | ICD-10-CM | POA: Diagnosis not present

## 2020-11-19 DIAGNOSIS — E785 Hyperlipidemia, unspecified: Secondary | ICD-10-CM

## 2020-11-19 NOTE — Patient Instructions (Signed)
Look at getting "powersteps", "superfeet" or "aetrex" inserts

## 2020-11-20 NOTE — Progress Notes (Signed)
Patient ID: Darlene KESINGER, female    DOB: [birth year]  MRN: [id number]  CC: Hypertension Follow-Up   Subjective: Darlene Crosby is a 55 y.o. female who presents for hypertension follow-up.   Her concerns today include:   HYPERTENSION FOLLOW-UP: 09/07/2020: - Home blood pressures are controlled. - Continue Amlodipine as prescribed.   11/23/2020: Doing well on current regimen. No side effects. No issues/concerns. Denies chest pain and shortness of breath. Reports home blood pressures are usually 120's-130's/80's with occasional 140's/80's. Reports ankle swelling has improved some. Seeing a foot doctor who says she has tendinitis and ordered compression stockings which seem to be helping.  Patient Active Problem List   Diagnosis Date Noted   Body mass index (BMI) 37.0-37.9, adult 03/14/2019   Lumbar stenosis without neurogenic claudication 03/14/2019   Hyperlipidemia 02/08/2019   Lumbar spondylosis 10/25/2018   Degeneration of lumbar intervertebral disc 10/01/2018   Surgical wound dehiscence 06/06/2018   Osteoarthritis of left hip 04/25/2018   Spinal stenosis of lumbar region 02/23/2018   Trigger thumb of right hand 03/30/2017   Dyspnea 03/26/2013   Asthma vs VCD  02/28/2013   Essential (primary) hypertension 06/07/2012   Pyogenic granuloma 02/15/2012     Current Outpatient Medications on File Prior to Visit  Medication Sig Dispense Refill   albuterol (PROVENTIL) (2.5 MG/3ML) 0.083% nebulizer solution Inhale 2.5 mg into the lungs every 4 (four) hours as needed for wheezing or shortness of breath.      albuterol (VENTOLIN HFA) 108 (90 Base) MCG/ACT inhaler Inhale 2 puffs into the lungs every 4 (four) hours as needed (wheezing and SOB).      atorvastatin (LIPITOR) 40 MG tablet Take 1 tablet by mouth once daily 90 tablet 0   betamethasone dipropionate (DIPROLENE) 0.05 % ointment Apply topically 2 (two) times daily. 30 g 0   cyclobenzaprine (FLEXERIL) 10 MG tablet Take 10 mg by  mouth daily as needed.     ibuprofen (ADVIL,MOTRIN) 200 MG tablet Take 600 mg by mouth 2 (two) times daily as needed for moderate pain.     levocetirizine (XYZAL) 5 MG tablet Take 5 mg by mouth every evening.     meloxicam (MOBIC) 7.5 MG tablet Take 1 tablet (7.5 mg total) by mouth daily as needed. 14 tablet 0   Multiple Vitamin (MULTIVITAMIN) tablet Take 1 tablet by mouth daily.     tiZANidine (ZANAFLEX) 4 MG tablet Take 1 tablet (4 mg total) by mouth at bedtime. 30 tablet 1   triamcinolone (KENALOG) 0.1 % Apply 1 application topically 2 (two) times daily. 80 g 0   No current facility-administered medications on file prior to visit.    Allergies  Allergen Reactions   Pineapple Shortness Of Breath, Swelling and Hives    Swelling of tongue    Chocolate Hives   Coconut Oil Hives and Rash   Strawberry Extract Hives   Wheat Bran Hives    Social History   Socioeconomic History   Marital status: Widowed    Spouse name: Not on file   Number of children: Not on file   Years of education: Not on file   Highest education level: Not on file  Occupational History   Not on file  Tobacco Use   Smoking status: Never   Smokeless tobacco: Never  Vaping Use   Vaping Use: Never used  Substance and Sexual Activity   Alcohol use: No   Drug use: Never   Sexual activity: Not on file  Other Topics  Concern   Not on file  Social History Narrative   Not on file   Social Determinants of Health   Financial Resource Strain: Not on file  Food Insecurity: Not on file  Transportation Needs: Not on file  Physical Activity: Not on file  Stress: Not on file  Social Connections: Not on file  Intimate Partner Violence: Not on file    Family History  Problem Relation Age of Onset   Hypertension Mother    Asthma Mother    Allergies Mother    Breast cancer Sister    Heart disease Sister    Hypertension Sister    Stroke Sister    Diabetes Sister    Colon cancer Neg Hx    Esophageal cancer  Neg Hx    Stomach cancer Neg Hx    Pancreatic cancer Neg Hx     Past Surgical History:  Procedure Laterality Date   ANTERIOR HIP REVISION Left 06/06/2018   Procedure: LEFT HIP WOUND DEHISCENCE POSSIBLE HEAD AND LINER EXCHANGE;  Surgeon: Rod Can, MD;  Location: [facility];  Service: Orthopedics;  Laterality: Left;   COLONOSCOPY  2015   DIAGNOSTIC LAPAROSCOPY     tubal preg-took ovary and tube   LAPAROSCOPY FOR ECTOPIC PREGNANCY  1990s   LEFT SALPINGECTOMY AND RIGHT OOPHORECTOMY FOR ABNORMALITY   MASS EXCISION  02/15/2012   Procedure: EXCISION MASS;  Surgeon: Schuyler Amor, MD;  Location: [facility];  Service: Orthopedics;  Laterality: Right;  Excision of Right Small Volar Mass   MYELOGRAM     TONSILLECTOMY  age 9   TOTAL HIP ARTHROPLASTY Left 04/25/2018   Procedure: TOTAL HIP ARTHROPLASTY ANTERIOR APPROACH;  Surgeon: Rod Can, MD;  Location: [facility];  Service: Orthopedics;  Laterality: Left;   TRIGGER FINGER RELEASE Right 2018   thumb   WISDOM TOOTH EXTRACTION  age 30    ROS: Review of Systems Negative except as stated above  PHYSICAL EXAM: BP (!) 144/99 (BP Location: Left Arm, Patient Position: Sitting, Cuff Size: Large)   Pulse 84   Temp 97.7 F (36.5 C)   Resp 18   Ht 5\' 5"  (1.651 m)   Wt 223 lb 3.2 oz (101.2 kg)   LMP 05/04/2012   SpO2 97%   BMI 37.14 kg/m   Physical Exam HENT:     Head: Normocephalic and atraumatic.  Eyes:     Extraocular Movements: Extraocular movements intact.     Conjunctiva/sclera: Conjunctivae normal.     Pupils: Pupils are equal, round, and reactive to light.  Cardiovascular:     Rate and Rhythm: Normal rate and regular rhythm.     Pulses: Normal pulses.     Heart sounds: Normal heart sounds.  Pulmonary:     Effort: Pulmonary effort is normal.     Breath sounds: Normal breath sounds.  Musculoskeletal:     Cervical back: Normal range of motion and neck supple.  Neurological:     General: No focal  deficit present.     Mental Status: She is alert and oriented to person, place, and time.  Psychiatric:        Mood and Affect: Mood normal.        Behavior: Behavior normal.   ASSESSMENT AND PLAN: 1. Essential hypertension: - Blood pressure not at goal during today's visit. Patient asymptomatic without chest pressure, chest pain, palpitations, shortness of breath, worst headache of life, and any additional red flag symptoms. - Home blood pressures at goal.  -  Continue Amlodipine as prescribed.  - Patient intolerant to Lisinopril, Losartan, and Hydrochlorothiazide. - Counseled on blood pressure goal of less than 130/80, low-sodium, DASH diet, medication compliance, 150 minutes of moderate intensity exercise per week as tolerated. Discussed medication compliance, adverse effects. - Follow-up with primary provider in 3 months or sooner if needed.  - amLODipine (NORVASC) 5 MG tablet; Take 1 tablet (5 mg total) by mouth daily.  Dispense: 90 tablet; Refill: 0    Patient was given the opportunity to ask questions.  Patient verbalized understanding of the plan and was able to repeat key elements of the plan. Patient was given clear instructions to go to Emergency Department or return to medical center if symptoms don't improve, worsen, or new problems develop.The patient verbalized understanding.    Requested Prescriptions   Signed Prescriptions Disp Refills   amLODipine (NORVASC) 5 MG tablet 90 tablet 0    Sig: Take 1 tablet (5 mg total) by mouth daily.    Return in about 3 months (around 02/22/2021) for Follow-Up or next available hypertension .  Camillia Herter, NP

## 2020-11-23 ENCOUNTER — Ambulatory Visit (INDEPENDENT_AMBULATORY_CARE_PROVIDER_SITE_OTHER): Payer: 59 | Admitting: Family

## 2020-11-23 ENCOUNTER — Other Ambulatory Visit: Payer: Self-pay

## 2020-11-23 ENCOUNTER — Encounter: Payer: Self-pay | Admitting: Family

## 2020-11-23 VITALS — BP 144/99 | HR 84 | Temp 97.7°F | Resp 18 | Ht 65.0 in | Wt 223.2 lb

## 2020-11-23 DIAGNOSIS — I10 Essential (primary) hypertension: Secondary | ICD-10-CM | POA: Diagnosis not present

## 2020-11-23 MED ORDER — AMLODIPINE BESYLATE 5 MG PO TABS: 5.0000 mg | ORAL_TABLET | Freq: Every day | ORAL | 0 refills | Status: DC

## 2020-11-23 NOTE — Progress Notes (Signed)
Pt presents for hypertension follow-up,

## 2020-11-24 NOTE — Progress Notes (Signed)
Subjective: 55 year old female presents the office today for follow-up evaluation of ankle swelling.  She does feel the amlodipine is causing this but she also does get some discomfort inside of her ankle.  She denies any recent injury or trauma.  Swelling is about the same.  No redness or warmth associated.  No open sores.  Objective: AAO x3, NAD DP/PT pulses palpable bilaterally, CRT less than 3 seconds There is still edema present to bilateral ankles.  There is tenderness to palpation on the course of the posterior tibial, flexor tendons posterior and inferior to the medial malleolus on the right side.  Clinically the tendon appears to be intact.  There is no pain with ankle or subtalar range of motion.  No area of pinpoint tenderness.  MMT 5/5.  No pain with calf compression, swelling, warmth, erythema  Assessment: 55 year old female with tendinitis right side, ankle swelling  Plan: -All treatment options discussed with the patient including all alternatives, risks, complications.  -I do still believe that her ankle swelling is multifactorial.  I think it still related to medications he still having tendinitis issues.  Refer to physical therapy.  Recommend compression socks, shoes and good arch support.  If symptoms continue MRI. -Patient encouraged to call the office with any questions, concerns, change in symptoms.   Trula Slade DPM

## 2020-11-30 ENCOUNTER — Other Ambulatory Visit: Payer: Self-pay

## 2020-11-30 ENCOUNTER — Ambulatory Visit: Payer: 59 | Attending: Podiatry

## 2020-11-30 VITALS — BP 150/98

## 2020-11-30 DIAGNOSIS — M79604 Pain in right leg: Secondary | ICD-10-CM | POA: Insufficient documentation

## 2020-11-30 DIAGNOSIS — R2689 Other abnormalities of gait and mobility: Secondary | ICD-10-CM | POA: Insufficient documentation

## 2020-11-30 NOTE — Therapy (Signed)
[place], [place], [address] Phone: [phone]   Fax:  [phone]  Physical Therapy Evaluation  Patient Details  Name: Darlene Crosby MRN: [id number] Date of Birth: [birth year] Referring Provider (PT): Trula Slade, [place]   Encounter Date: 11/30/2020   PT End of Session - 11/30/20 1355     Visit Number 1    Number of Visits 17    Date for PT Re-Evaluation 01/25/21    Authorization Type [employer]    PT Start Time 0915    PT Stop Time 1000    PT Time Calculation (min) 45 min    Activity Tolerance Patient tolerated treatment well;No increased pain    Behavior During Therapy WFL for tasks assessed/performed             Past Medical History:  Diagnosis Date   Asthma    followed by pcp   Hypertension    OA (osteoarthritis)    left knee, right shoulder, left hip   Seasonal allergies    Wears glasses     Past Surgical History:  Procedure Laterality Date   ANTERIOR HIP REVISION Left 06/06/2018   Procedure: LEFT HIP WOUND DEHISCENCE POSSIBLE HEAD AND LINER EXCHANGE;  Surgeon: Rod Can, MD;  Location: [facility];  Service: Orthopedics;  Laterality: Left;   COLONOSCOPY  2015   DIAGNOSTIC LAPAROSCOPY     tubal preg-took ovary and tube   LAPAROSCOPY FOR ECTOPIC PREGNANCY  1990s   LEFT SALPINGECTOMY AND RIGHT OOPHORECTOMY FOR ABNORMALITY   MASS EXCISION  02/15/2012   Procedure: EXCISION MASS;  Surgeon: Schuyler Amor, MD;  Location: [facility];  Service: Orthopedics;  Laterality: Right;  Excision of Right Small Volar Mass   MYELOGRAM     TONSILLECTOMY  age 57   TOTAL HIP ARTHROPLASTY Left 04/25/2018   Procedure: TOTAL HIP ARTHROPLASTY ANTERIOR APPROACH;  Surgeon: Rod Can, MD;  Location: [facility];  Service: Orthopedics;  Laterality: Left;   TRIGGER FINGER RELEASE Right 2018   thumb   WISDOM TOOTH EXTRACTION  age 28    Vitals:   11/30/20 0924  BP: (!)  150/98      Subjective Assessment - 11/30/20 0924     Subjective Pt presents to PT with reports of 2wk hx of R ankle swelling and discomfort. Insidous onset with no MOI, also denies N/T down R LE or at site of R ankle. Does promote some LBP that has been chronic and she is worried about it getting worse. Has noted some improvement with elevation of R LE at night, but swelling will return when sitting in dependent position or with prolonged standing.    Pertinent History 2-3wk hx of R ankle pain and swelling with no MOI    Limitations Sitting;Standing;Walking    How long can you sit comfortably? 60 min    How long can you stand comfortably? 20-30 min    How long can you walk comfortably? 5 min    Patient Stated Goals Pt wants to decrease R ankle pain in order to get back to recreational walking    Currently in Pain? Yes    Pain Score 8    8/10 at worst   Pain Location Ankle    Pain Orientation Right    Pain Descriptors / Indicators Aching;Sore    Pain Type Acute pain    Pain Onset 1 to 4 weeks ago    Pain Frequency Constant    Aggravating  Factors  prolonged sitting/standing    Pain Relieving Factors ice, elevation                [facility] PT Assessment - 11/30/20 0001       Assessment   Medical Diagnosis M77.51 (ICD-10-CM) - Tendonitis of ankle, right  M76.829 (ICD-10-CM) - PTTD (posterior tibial tendon dysfunction)    Referring Provider (PT) Trula Slade, DPM    Hand Dominance Right      Precautions   Precautions None      Restrictions   Weight Bearing Restrictions No      Balance Screen   Has the patient fallen in the past 6 months No    Has the patient had a decrease in activity level because of a fear of falling?  No    Is the patient reluctant to leave their home because of a fear of falling?  No      Home Environment   Living Environment Private residence    Living Arrangements Alone    Type of [place] Access Level entry    Home Layout One  level    [place] - 2 wheels;Cane - single point      Prior Function   Level of Independence Independent;Independent with basic ADLs    Vocation Part time employment    Games developer on weekends at [employer]   Overall Cognitive Status Within Functional Limits for tasks assessed    Attention Focused      Observation/Other Assessments   Focus on Therapeutic Outcomes (FOTO)  49% function; 66% predicted      Functional Tests   Functional tests Single leg stance;Sit to Stand      Single Leg Stance   Comments R: 6 sec; L: 8 sec      Sit to Stand   Comments 30 Sec STS: 6 reps      AROM   Overall AROM Comments LE AROM WFL      Strength   Overall Strength Comments 5/5 LE MMT      Palpation   Palpation comment TTP to R tibialis posterior                        Objective measurements completed on examination: See above findings.                PT Education - 11/30/20 1410     Education Details eval findings, FOTO, HEP, POC    Person(s) Educated Patient    Methods Explanation;Demonstration;Handout    Comprehension Verbalized understanding;Returned demonstration              PT Short Term Goals - 11/30/20 1355       PT SHORT TERM GOAL #1   Title Pt will be knowledgeable of initial HEP for improved comfort and carryover    Baseline initial HEP given    Time 3    Period Weeks    Status New    Target Date 12/21/20               PT Long Term Goals - 11/30/20 1356       PT LONG TERM GOAL #1   Title Pt will improve FOT to no less than 66% as proxy for functional improvement    Baseline 49% function    Time 8    Period Weeks    Status New  Target Date 01/25/21      PT LONG TERM GOAL #2   Title Pt will improve SLS to no less than 20 sec ea for improved balance and mobility    Baseline R: 6 sec, L: 8 sec    Time 8    Period Weeks    Status New    Target Date 01/25/21       PT LONG TERM GOAL #3   Title Pt will improve 30 sec STS to no less than 8 reps for improved balance and mobility    Baseline 6 reps    Time 8    Period Weeks    Status New    Target Date 01/25/21      PT LONG TERM GOAL #4   Title Pt will self report R LE pain no greater than 3/10 at worst for improved comfort and function    Baseline 810 at worst    Time 8    Period Weeks    Status New    Target Date 01/25/21                    Plan - 11/30/20 1403     Clinical Impression Statement Pt is a pleasant 55 y/o F who presents to PT with reports of R ankle pain and swelling. Physical findings are consistent with referring provider impression, as pt demos TTP to R tibialis post, decreased SLS time, and reduced functional mobility with low rep count on 30 Sec STS. She would benefit from skilled PT services working on improving balance, improving strength, and decreasing pain. Will assess response to initial HEP and progress as able.    Personal Factors and Comorbidities Comorbidity 1;Fitness    Comorbidities PMH: HTN    Examination-Activity Limitations Stand;Stairs;Squat;Sit;Transfers;Locomotion Level    Examination-Participation Restrictions Yard Work;Occupation;Community Activity    Stability/Clinical Decision Making Stable/Uncomplicated    Clinical Decision Making Low    Rehab Potential Good    PT Frequency 2x / week    PT Duration 8 weeks    PT Treatment/Interventions ADLs/Self Care Home Management;Electrical Stimulation;Cryotherapy;Moist Heat;Gait training;Stair training;Functional mobility training;Therapeutic activities;Therapeutic exercise;Balance training;Neuromuscular re-education;Patient/family education;Manual techniques;Dry needling;Taping;Vasopneumatic Device    PT Next Visit Plan assess response to HEP, progress as able    PT Home Exercise Plan Access Code: [email or web address]    Consulted and Agree with Plan of Care Patient             Patient will benefit from skilled  therapeutic intervention in order to improve the following deficits and impairments:  Abnormal gait, Decreased activity tolerance, Decreased balance, Decreased endurance, Decreased mobility, Decreased strength, Pain, Difficulty walking  Visit Diagnosis: Pain in right leg - Plan: PT plan of care cert/re-cert  Other abnormalities of gait and mobility - Plan: PT plan of care cert/re-cert     Problem List Patient Active Problem List   Diagnosis Date Noted   Body mass index (BMI) 37.0-37.9, adult 03/14/2019   Lumbar stenosis without neurogenic claudication 03/14/2019   Hyperlipidemia 02/08/2019   Lumbar spondylosis 10/25/2018   Degeneration of lumbar intervertebral disc 10/01/2018   Surgical wound dehiscence 06/06/2018   Osteoarthritis of left hip 04/25/2018   Spinal stenosis of lumbar region 02/23/2018   Trigger thumb of right hand 03/30/2017   Dyspnea 03/26/2013   Asthma vs VCD  02/28/2013   Essential (primary) hypertension 06/07/2012   Pyogenic granuloma 02/15/2012    Ward Chatters, PT 11/30/2020, 2:11 PM  [facility] [address]  [place], [place], [address] Phone: [phone]   Fax:  [phone]  Name: Darlene Crosby MRN: [id number] Date of Birth: 04/04/1965

## 2020-12-08 ENCOUNTER — Ambulatory Visit: Payer: 59 | Admitting: Physical Therapy

## 2020-12-08 ENCOUNTER — Encounter: Payer: Self-pay | Admitting: Physical Therapy

## 2020-12-08 ENCOUNTER — Other Ambulatory Visit: Payer: Self-pay

## 2020-12-08 DIAGNOSIS — M79604 Pain in right leg: Secondary | ICD-10-CM | POA: Diagnosis not present

## 2020-12-08 DIAGNOSIS — R2689 Other abnormalities of gait and mobility: Secondary | ICD-10-CM

## 2020-12-08 NOTE — Therapy (Signed)
[place], [place], [address] Phone: [phone]   Fax:  [phone]  Physical Therapy Treatment  Patient Details  Name: Darlene Crosby MRN: [id number] Date of Birth: October 09, 1965 Referring Provider (PT): Trula Slade, [place]   Encounter Date: 12/08/2020   PT End of Session - 12/08/20 1159     Visit Number 2    Number of Visits 17    Date for PT Re-Evaluation 01/25/21    Authorization Type [employer]    PT Start Time 1155    PT Stop Time 1233    PT Time Calculation (min) 38 min             Past Medical History:  Diagnosis Date   Asthma    followed by pcp   Hypertension    OA (osteoarthritis)    left knee, right shoulder, left hip   Seasonal allergies    Wears glasses     Past Surgical History:  Procedure Laterality Date   ANTERIOR HIP REVISION Left 06/06/2018   Procedure: LEFT HIP WOUND DEHISCENCE POSSIBLE HEAD AND LINER EXCHANGE;  Surgeon: Rod Can, MD;  Location: [facility];  Service: Orthopedics;  Laterality: Left;   COLONOSCOPY  2015   DIAGNOSTIC LAPAROSCOPY     tubal preg-took ovary and tube   LAPAROSCOPY FOR ECTOPIC PREGNANCY  1990s   LEFT SALPINGECTOMY AND RIGHT OOPHORECTOMY FOR ABNORMALITY   MASS EXCISION  02/15/2012   Procedure: EXCISION MASS;  Surgeon: Schuyler Amor, MD;  Location: [facility];  Service: Orthopedics;  Laterality: Right;  Excision of Right Small Volar Mass   MYELOGRAM     TONSILLECTOMY  age 30   TOTAL HIP ARTHROPLASTY Left 04/25/2018   Procedure: TOTAL HIP ARTHROPLASTY ANTERIOR APPROACH;  Surgeon: Rod Can, MD;  Location: [facility];  Service: Orthopedics;  Laterality: Left;   TRIGGER FINGER RELEASE Right 2018   thumb   WISDOM TOOTH EXTRACTION  age 71    There were no vitals filed for this visit.   Subjective Assessment - 12/08/20 1155     Subjective I think the ankle is still swollen. It is not hurting yet today. The  ankle still hurts if I am active.    Pertinent History 2-3wk hx of R ankle pain and swelling with no MOI    Currently in Pain? No/denies    Pain Score 0-No pain    Pain Location Ankle                   [facility] Adult PT Treatment/Exercise - 12/08/20 0001       Modalities   Modalities Cryotherapy      Cryotherapy   Number Minutes Cryotherapy 8 Minutes    Cryotherapy Location Ankle    Type of Cryotherapy Ice pack      Ankle Exercises: Stretches   Soleus Stretch 2 reps    Gastroc Stretch 2 reps      Ankle Exercises: Aerobic   Recumbent Bike L1 x 5 minutes      Ankle Exercises: Seated   Heel Raises 20 reps    Heel Raises Limitations ball between ankles    Toe Raise 20 reps    BAPS Level 2                       PT Short Term Goals - 11/30/20 1355       PT SHORT TERM GOAL #1   Title Pt  will be knowledgeable of initial HEP for improved comfort and carryover    Baseline initial HEP given    Time 3    Period Weeks    Status New    Target Date 12/21/20               PT Long Term Goals - 11/30/20 1356       PT LONG TERM GOAL #1   Title Pt will improve FOT to no less than 66% as proxy for functional improvement    Baseline 49% function    Time 8    Period Weeks    Status New    Target Date 01/25/21      PT LONG TERM GOAL #2   Title Pt will improve SLS to no less than 20 sec ea for improved balance and mobility    Baseline R: 6 sec, L: 8 sec    Time 8    Period Weeks    Status New    Target Date 01/25/21      PT LONG TERM GOAL #3   Title Pt will improve 30 sec STS to no less than 8 reps for improved balance and mobility    Baseline 6 reps    Time 8    Period Weeks    Status New    Target Date 01/25/21      PT LONG TERM GOAL #4   Title Pt will self report R LE pain no greater than 3/10 at worst for improved comfort and function    Baseline 810 at worst    Time 8    Period Weeks    Status New    Target Date 01/25/21                    Plan - 12/08/20 1417     Clinical Impression Statement Pt reports compliance with HEP and continued edema in right > left ankle. She is wearing compression stockings today. She voices frustration with inability to exercise due to ankle pain, back pain and hip stiffness which limit her standing and walking tolerance. Began recumbment biked and reviewed HEP with seated options. Began standing calf stretching and updated to HEP. Continued wtih seated ankle mobility which was tolerated well with some increased soreness. Cryotherapy applied at end of session to reduce edema and soreness.    PT Treatment/Interventions ADLs/Self Care Home Management;Electrical Stimulation;Cryotherapy;Moist Heat;Gait training;Stair training;Functional mobility training;Therapeutic activities;Therapeutic exercise;Balance training;Neuromuscular re-education;Patient/family education;Manual techniques;Dry needling;Taping;Vasopneumatic Device    PT Next Visit Plan assess response to HEP, progress as able    PT Home Exercise Plan Access Code: [email or web address]             Patient will benefit from skilled therapeutic intervention in order to improve the following deficits and impairments:  Abnormal gait, Decreased activity tolerance, Decreased balance, Decreased endurance, Decreased mobility, Decreased strength, Pain, Difficulty walking  Visit Diagnosis: Pain in right leg  Other abnormalities of gait and mobility     Problem List Patient Active Problem List   Diagnosis Date Noted   Body mass index (BMI) 37.0-37.9, adult 03/14/2019   Lumbar stenosis without neurogenic claudication 03/14/2019   Hyperlipidemia 02/08/2019   Lumbar spondylosis 10/25/2018   Degeneration of lumbar intervertebral disc 10/01/2018   Surgical wound dehiscence 06/06/2018   Osteoarthritis of left hip 04/25/2018   Spinal stenosis of lumbar region 02/23/2018   Trigger thumb of right hand 03/30/2017   Dyspnea 03/26/2013   Asthma vs VCD   02/28/2013  Essential (primary) hypertension 06/07/2012   Pyogenic granuloma 02/15/2012    Dorene Ar, PTA 12/08/2020, 2:23 PM  [facility] [address] [place], [place], [address] Phone: [phone]   Fax:  [phone]  Name: Darlene Crosby MRN: [id number] Date of Birth: 03-Oct-1965

## 2020-12-10 ENCOUNTER — Other Ambulatory Visit: Payer: Self-pay

## 2020-12-10 ENCOUNTER — Ambulatory Visit: Payer: 59

## 2020-12-10 DIAGNOSIS — M79604 Pain in right leg: Secondary | ICD-10-CM | POA: Diagnosis not present

## 2020-12-10 DIAGNOSIS — R2689 Other abnormalities of gait and mobility: Secondary | ICD-10-CM

## 2020-12-10 NOTE — Therapy (Signed)
[place], [place], [address] Phone: [phone]   Fax:  [phone]  Physical Therapy Treatment  Patient Details  Name: Darlene Crosby MRN: [id number] Date of Birth: [birth year] Referring Provider (PT): Trula Slade, [place]   Encounter Date: 12/10/2020   PT End of Session - 12/10/20 0911     Visit Number 3    Number of Visits 17    Date for PT Re-Evaluation 01/25/21    Authorization Type [employer]    PT Start Time 0915    PT Stop Time 0957    PT Time Calculation (min) 42 min    Activity Tolerance Patient tolerated treatment well;No increased pain    Behavior During Therapy WFL for tasks assessed/performed             Past Medical History:  Diagnosis Date   Asthma    followed by pcp   Hypertension    OA (osteoarthritis)    left knee, right shoulder, left hip   Seasonal allergies    Wears glasses     Past Surgical History:  Procedure Laterality Date   ANTERIOR HIP REVISION Left 06/06/2018   Procedure: LEFT HIP WOUND DEHISCENCE POSSIBLE HEAD AND LINER EXCHANGE;  Surgeon: Rod Can, MD;  Location: [facility];  Service: Orthopedics;  Laterality: Left;   COLONOSCOPY  2015   DIAGNOSTIC LAPAROSCOPY     tubal preg-took ovary and tube   LAPAROSCOPY FOR ECTOPIC PREGNANCY  1990s   LEFT SALPINGECTOMY AND RIGHT OOPHORECTOMY FOR ABNORMALITY   MASS EXCISION  02/15/2012   Procedure: EXCISION MASS;  Surgeon: Schuyler Amor, MD;  Location: [facility];  Service: Orthopedics;  Laterality: Right;  Excision of Right Small Volar Mass   MYELOGRAM     TONSILLECTOMY  age 69   TOTAL HIP ARTHROPLASTY Left 04/25/2018   Procedure: TOTAL HIP ARTHROPLASTY ANTERIOR APPROACH;  Surgeon: Rod Can, MD;  Location: [facility];  Service: Orthopedics;  Laterality: Left;   TRIGGER FINGER RELEASE Right 2018   thumb   WISDOM TOOTH EXTRACTION  age 83    There were no vitals filed for this  visit.   Subjective Assessment - 12/10/20 0911     Subjective Pt presents to PT with no current pain or discomfort. Notes that swelling in R ankle has been decreasing and she has been managing it well. Ready to begin PT treatment at this time.    Currently in Pain? No/denies    Pain Score 0-No pain           [facility] Adult PT Treatment/Exercise:   Therapeutic Exercise:  NuStep lvl 5 UE/LE while taking subjective Towel inv/ev x 5 4lb ea R ankle 4-way red tband 2x15 ea Step up 6in x 10 ea Heel Toe raise 2x20 Standing hip abd 2x10 ea  Neuro Re-Ed:  Tandem walk x 2 laps in // Tandem stance 2x30 sec ea                             PT Education - 12/10/20 0951     Education Details HEP update    Person(s) Educated Patient    Methods Explanation;Demonstration;Handout    Comprehension Verbalized understanding;Returned demonstration              PT Short Term Goals - 11/30/20 1355       PT SHORT TERM GOAL #1   Title Pt will be knowledgeable of  initial HEP for improved comfort and carryover    Baseline initial HEP given    Time 3    Period Weeks    Status New    Target Date 12/21/20               PT Long Term Goals - 11/30/20 1356       PT LONG TERM GOAL #1   Title Pt will improve FOT to no less than 66% as proxy for functional improvement    Baseline 49% function    Time 8    Period Weeks    Status New    Target Date 01/25/21      PT LONG TERM GOAL #2   Title Pt will improve SLS to no less than 20 sec ea for improved balance and mobility    Baseline R: 6 sec, L: 8 sec    Time 8    Period Weeks    Status New    Target Date 01/25/21      PT LONG TERM GOAL #3   Title Pt will improve 30 sec STS to no less than 8 reps for improved balance and mobility    Baseline 6 reps    Time 8    Period Weeks    Status New    Target Date 01/25/21      PT LONG TERM GOAL #4   Title Pt will self report R LE pain no greater than 3/10 at worst  for improved comfort and function    Baseline 810 at worst    Time 8    Period Weeks    Status New    Target Date 01/25/21                   Plan - 12/10/20 0948     Clinical Impression Statement Pt was once again able to complete prescribed with no adverse effect or change in baseline. Today's session focused on continued strengthening and improving stability of distal LE musculature and R ankle. She continues to show improving stability and activity tolerance progressing as expected with therapy. PT will continue to progress as tolerated per POC.    PT Treatment/Interventions ADLs/Self Care Home Management;Electrical Stimulation;Cryotherapy;Moist Heat;Gait training;Stair training;Functional mobility training;Therapeutic activities;Therapeutic exercise;Balance training;Neuromuscular re-education;Patient/family education;Manual techniques;Dry needling;Taping;Vasopneumatic Device    PT Next Visit Plan assess response to HEP, progress as able    PT Home Exercise Plan Access Code: [email or web address]             Patient will benefit from skilled therapeutic intervention in order to improve the following deficits and impairments:  Abnormal gait, Decreased activity tolerance, Decreased balance, Decreased endurance, Decreased mobility, Decreased strength, Pain, Difficulty walking  Visit Diagnosis: Pain in right leg  Other abnormalities of gait and mobility     Problem List Patient Active Problem List   Diagnosis Date Noted   Body mass index (BMI) 37.0-37.9, adult 03/14/2019   Lumbar stenosis without neurogenic claudication 03/14/2019   Hyperlipidemia 02/08/2019   Lumbar spondylosis 10/25/2018   Degeneration of lumbar intervertebral disc 10/01/2018   Surgical wound dehiscence 06/06/2018   Osteoarthritis of left hip 04/25/2018   Spinal stenosis of lumbar region 02/23/2018   Trigger thumb of right hand 03/30/2017   Dyspnea 03/26/2013   Asthma vs VCD  02/28/2013   Essential  (primary) hypertension 06/07/2012   Pyogenic granuloma 02/15/2012    Ward Chatters, PT 12/10/2020, 9:59 AM  [facility]  [place], [place], [address] Phone: [phone]   Fax:  [phone]  Name: Darlene Crosby MRN: [id number] Date of Birth: [birth year]

## 2020-12-14 ENCOUNTER — Other Ambulatory Visit: Payer: Self-pay

## 2020-12-14 ENCOUNTER — Ambulatory Visit: Payer: 59

## 2020-12-14 DIAGNOSIS — M79604 Pain in right leg: Secondary | ICD-10-CM

## 2020-12-14 DIAGNOSIS — R2689 Other abnormalities of gait and mobility: Secondary | ICD-10-CM

## 2020-12-14 NOTE — Therapy (Signed)
[facility] [place], [place], [address] Phone: [phone]   Fax:  [phone]  Physical Therapy Treatment  Patient Details  Name: Darlene Crosby MRN: [id number] Date of Birth: 01-01-1966 Referring Provider (PT): Trula Slade, [place]   Encounter Date: 12/14/2020   PT End of Session - 12/14/20 0903     Visit Number 4    Number of Visits 17    Date for PT Re-Evaluation 01/25/21    Authorization Type [employer]    PT Start Time 0915    PT Stop Time 0959    PT Time Calculation (min) 44 min    Activity Tolerance Patient tolerated treatment well;No increased pain    Behavior During Therapy WFL for tasks assessed/performed             Past Medical History:  Diagnosis Date   Asthma    followed by pcp   Hypertension    OA (osteoarthritis)    left knee, right shoulder, left hip   Seasonal allergies    Wears glasses     Past Surgical History:  Procedure Laterality Date   ANTERIOR HIP REVISION Left 06/06/2018   Procedure: LEFT HIP WOUND DEHISCENCE POSSIBLE HEAD AND LINER EXCHANGE;  Surgeon: Rod Can, MD;  Location: [facility];  Service: Orthopedics;  Laterality: Left;   COLONOSCOPY  2015   DIAGNOSTIC LAPAROSCOPY     tubal preg-took ovary and tube   LAPAROSCOPY FOR ECTOPIC PREGNANCY  1990s   LEFT SALPINGECTOMY AND RIGHT OOPHORECTOMY FOR ABNORMALITY   MASS EXCISION  02/15/2012   Procedure: EXCISION MASS;  Surgeon: Schuyler Amor, MD;  Location: [facility];  Service: Orthopedics;  Laterality: Right;  Excision of Right Small Volar Mass   MYELOGRAM     TONSILLECTOMY  age 86   TOTAL HIP ARTHROPLASTY Left 04/25/2018   Procedure: TOTAL HIP ARTHROPLASTY ANTERIOR APPROACH;  Surgeon: Rod Can, MD;  Location: [facility];  Service: Orthopedics;  Laterality: Left;   TRIGGER FINGER RELEASE Right 2018   thumb   WISDOM TOOTH EXTRACTION  age 88    There were no vitals filed for this  visit.   Subjective Assessment - 12/14/20 0903     Subjective Pt presents to PT with no current pain or discomfort. Notes that swelling in R ankle has been continuing to decrease. Ready to begin PT treatment at this time.    Currently in Pain? No/denies    Pain Score 0-No pain           [facility] Adult PT Treatment/Exercise:   Therapeutic Exercise:  NuStep lvl 5 UE/LE while taking subjective STS 2x10 - no UE support Bridge 2x10 - 3 sec hold Towel inv/ev x 5 4lb ea R ankle 4-way red tband 2x15 ea Step up 8in x 10 ea Heel Toe raise 2x20 Standing hip abd/ext 2x10 ea red tband   Neuro Re-Ed:  Tandem walk x 2 laps in // Tandem stance 2x30 sec ea                               PT Short Term Goals - 11/30/20 1355       PT SHORT TERM GOAL #1   Title Pt will be knowledgeable of initial HEP for improved comfort and carryover    Baseline initial HEP given    Time 3    Period Weeks    Status New  Target Date 12/21/20               PT Long Term Goals - 11/30/20 1356       PT LONG TERM GOAL #1   Title Pt will improve FOT to no less than 66% as proxy for functional improvement    Baseline 49% function    Time 8    Period Weeks    Status New    Target Date 01/25/21      PT LONG TERM GOAL #2   Title Pt will improve SLS to no less than 20 sec ea for improved balance and mobility    Baseline R: 6 sec, L: 8 sec    Time 8    Period Weeks    Status New    Target Date 01/25/21      PT LONG TERM GOAL #3   Title Pt will improve 30 sec STS to no less than 8 reps for improved balance and mobility    Baseline 6 reps    Time 8    Period Weeks    Status New    Target Date 01/25/21      PT LONG TERM GOAL #4   Title Pt will self report R LE pain no greater than 3/10 at worst for improved comfort and function    Baseline 810 at worst    Time 8    Period Weeks    Status New    Target Date 01/25/21                   Plan - 12/14/20 0924      Clinical Impression Statement Pt was able to complete prescribed exercises with no adverse effect or increase in pain. Today's session continued to focus on improving LE strength and R ankle stability. She is progressing as expected with therapy and should continue to be seen and progressed as tolerated.    PT Treatment/Interventions ADLs/Self Care Home Management;Electrical Stimulation;Cryotherapy;Moist Heat;Gait training;Stair training;Functional mobility training;Therapeutic activities;Therapeutic exercise;Balance training;Neuromuscular re-education;Patient/family education;Manual techniques;Dry needling;Taping;Vasopneumatic Device    PT Next Visit Plan assess response to HEP, progress as able    PT Home Exercise Plan Access Code: [email or web address]             Patient will benefit from skilled therapeutic intervention in order to improve the following deficits and impairments:  Abnormal gait, Decreased activity tolerance, Decreased balance, Decreased endurance, Decreased mobility, Decreased strength, Pain, Difficulty walking  Visit Diagnosis: Pain in right leg  Other abnormalities of gait and mobility     Problem List Patient Active Problem List   Diagnosis Date Noted   Body mass index (BMI) 37.0-37.9, adult 03/14/2019   Lumbar stenosis without neurogenic claudication 03/14/2019   Hyperlipidemia 02/08/2019   Lumbar spondylosis 10/25/2018   Degeneration of lumbar intervertebral disc 10/01/2018   Surgical wound dehiscence 06/06/2018   Osteoarthritis of left hip 04/25/2018   Spinal stenosis of lumbar region 02/23/2018   Trigger thumb of right hand 03/30/2017   Dyspnea 03/26/2013   Asthma vs VCD  02/28/2013   Essential (primary) hypertension 06/07/2012   Pyogenic granuloma 02/15/2012    Ward Chatters, PT 12/14/2020, 9:59 AM  [facility] [address] [place], [place], [address] Phone: [phone]   Fax:  [phone]  Name:  Darlene Crosby MRN: [id number] Date of Birth: 1965/10/01

## 2020-12-16 ENCOUNTER — Other Ambulatory Visit: Payer: Self-pay

## 2020-12-16 ENCOUNTER — Ambulatory Visit: Payer: 59

## 2020-12-16 DIAGNOSIS — M79604 Pain in right leg: Secondary | ICD-10-CM

## 2020-12-16 DIAGNOSIS — R2689 Other abnormalities of gait and mobility: Secondary | ICD-10-CM

## 2020-12-16 NOTE — Therapy (Signed)
[place], [place], [address] Phone: [phone]   Fax:  [phone]  Physical Therapy Treatment  Patient Details  Name: Darlene Crosby MRN: [id number] Date of Birth: May 12, 1965 Referring Provider (PT): Trula Slade, [place]   Encounter Date: 12/16/2020   PT End of Session - 12/16/20 0910     Visit Number 5    Number of Visits 17    Date for PT Re-Evaluation 01/25/21    Authorization Type [employer]    PT Start Time 0915    PT Stop Time 0955    PT Time Calculation (min) 40 min    Activity Tolerance Patient tolerated treatment well;No increased pain    Behavior During Therapy WFL for tasks assessed/performed             Past Medical History:  Diagnosis Date   Asthma    followed by pcp   Hypertension    OA (osteoarthritis)    left knee, right shoulder, left hip   Seasonal allergies    Wears glasses     Past Surgical History:  Procedure Laterality Date   ANTERIOR HIP REVISION Left 06/06/2018   Procedure: LEFT HIP WOUND DEHISCENCE POSSIBLE HEAD AND LINER EXCHANGE;  Surgeon: Rod Can, MD;  Location: [facility];  Service: Orthopedics;  Laterality: Left;   COLONOSCOPY  2015   DIAGNOSTIC LAPAROSCOPY     tubal preg-took ovary and tube   LAPAROSCOPY FOR ECTOPIC PREGNANCY  1990s   LEFT SALPINGECTOMY AND RIGHT OOPHORECTOMY FOR ABNORMALITY   MASS EXCISION  02/15/2012   Procedure: EXCISION MASS;  Surgeon: Schuyler Amor, MD;  Location: [facility];  Service: Orthopedics;  Laterality: Right;  Excision of Right Small Volar Mass   MYELOGRAM     TONSILLECTOMY  age 55   TOTAL HIP ARTHROPLASTY Left 04/25/2018   Procedure: TOTAL HIP ARTHROPLASTY ANTERIOR APPROACH;  Surgeon: Rod Can, MD;  Location: [facility];  Service: Orthopedics;  Laterality: Left;   TRIGGER FINGER RELEASE Right 2018   thumb   WISDOM TOOTH EXTRACTION  age 55    There were no vitals filed for this  visit.   Subjective Assessment - 12/16/20 0910     Subjective Pt presents to PT with continued reports of no pain or R ankle swelling. She has been compliant with HEP with no adverse effect and is ready to begin PT at this time.    Currently in Pain? No/denies    Pain Score 0-No pain           [facility] Adult PT Treatment/Exercise:   Therapeutic Exercise:  NuStep lvl 6 UE/LE while taking subjective Step up 8in 2x10 ea Heel Toe raise x20 Standing hip abd/ext 2x10 ea yellow tband Lateral walk x 3 laps in //  Past Interventions Not Performed Today:  STS 2x10 - no UE support Bridge 2x10 - 3 sec hold Towel inv/ev x 5 4lb ea R ankle 4-way red tband 2x15 ea  Neuro Re-Ed:  Tandem walk x 2 laps in // High march x 2 laps in // Tandem stance 2x30 sec ea SLS 2x20 sec     [facility] PT Assessment - 12/16/20 0001       Single Leg Stance   Comments 20 sec bilaterally  PT Short Term Goals - 12/16/20 0943       PT SHORT TERM GOAL #1   Title Pt will be knowledgeable of initial HEP for improved comfort and carryover    Baseline initial HEP given    Time 3    Period Weeks    Status Achieved    Target Date 12/21/20               PT Long Term Goals - 12/16/20 0942       PT LONG TERM GOAL #1   Title Pt will improve FOT to no less than 66% as proxy for functional improvement    Baseline 49% function    Time 8    Period Weeks    Status On-going    Target Date 01/25/21      PT LONG TERM GOAL #2   Title Pt will improve SLS to no less than 20 sec ea for improved balance and mobility    Baseline R: 6 sec, L: 8 sec; goal met on 10/19    Time 8    Period Weeks    Status Achieved    Target Date 01/25/21      PT LONG TERM GOAL #3   Title Pt will improve 30 sec STS to no less than 8 reps for improved balance and mobility    Baseline 6 reps    Time 8    Period Weeks    Status On-going      PT LONG TERM GOAL #4    Title Pt will self report R LE pain no greater than 3/10 at worst for improved comfort and function    Baseline 810 at worst    Time 8    Period Weeks    Status On-going    Target Date 01/25/21                   Plan - 12/16/20 0918     Clinical Impression Statement Pt was once again able to complete prescribed exercises with no adverse effect or increase in pain. Today's session we continued to work on improving proximal hip strength, as well as ankle strength and stability. She has met her LTG for SLS time today, showing improvement in functional stability for bilateral ankles. Will continue to progress as able per POC.    PT Treatment/Interventions ADLs/Self Care Home Management;Electrical Stimulation;Cryotherapy;Moist Heat;Gait training;Stair training;Functional mobility training;Therapeutic activities;Therapeutic exercise;Balance training;Neuromuscular re-education;Patient/family education;Manual techniques;Dry needling;Taping;Vasopneumatic Device    PT Next Visit Plan assess response to HEP, progress as able    PT Home Exercise Plan Access Code: [email or web address]             Patient will benefit from skilled therapeutic intervention in order to improve the following deficits and impairments:  Abnormal gait, Decreased activity tolerance, Decreased balance, Decreased endurance, Decreased mobility, Decreased strength, Pain, Difficulty walking  Visit Diagnosis: Pain in right leg  Other abnormalities of gait and mobility     Problem List Patient Active Problem List   Diagnosis Date Noted   Body mass index (BMI) 37.0-37.9, adult 03/14/2019   Lumbar stenosis without neurogenic claudication 03/14/2019   Hyperlipidemia 02/08/2019   Lumbar spondylosis 10/25/2018   Degeneration of lumbar intervertebral disc 10/01/2018   Surgical wound dehiscence 06/06/2018   Osteoarthritis of left hip 04/25/2018   Spinal stenosis of lumbar region 02/23/2018   Trigger thumb of right hand  03/30/2017   Dyspnea 03/26/2013   Asthma vs VCD  02/28/2013   Essential (  primary) hypertension 06/07/2012   Pyogenic granuloma 02/15/2012    Ward Chatters, PT 12/16/2020, 9:56 AM  [facility] [address] [place], [place], [address] Phone: [phone]   Fax:  [phone]  Name: Darlene Crosby MRN: [id number] Date of Birth: 1966-02-01

## 2020-12-21 ENCOUNTER — Other Ambulatory Visit: Payer: Self-pay

## 2020-12-21 ENCOUNTER — Ambulatory Visit: Payer: 59

## 2020-12-21 DIAGNOSIS — R2689 Other abnormalities of gait and mobility: Secondary | ICD-10-CM

## 2020-12-21 DIAGNOSIS — M79604 Pain in right leg: Secondary | ICD-10-CM | POA: Diagnosis not present

## 2020-12-21 NOTE — Therapy (Signed)
[place], [place], [address] Phone: [phone]   Fax:  [phone]  Physical Therapy Treatment/Discharge  Patient Details  Name: Darlene Crosby MRN: [id number] Date of Birth: October 05, 1965 Referring Provider (PT): Trula Slade, [place]   Encounter Date: 12/21/2020   PT End of Session - 12/21/20 0914     Visit Number 6    Number of Visits 17    Date for PT Re-Evaluation 01/25/21    Authorization Type [employer]    PT Start Time 0915    PT Stop Time 0958    PT Time Calculation (min) 43 min    Activity Tolerance Patient tolerated treatment well;No increased pain    Behavior During Therapy WFL for tasks assessed/performed             Past Medical History:  Diagnosis Date   Asthma    followed by pcp   Hypertension    OA (osteoarthritis)    left knee, right shoulder, left hip   Seasonal allergies    Wears glasses     Past Surgical History:  Procedure Laterality Date   ANTERIOR HIP REVISION Left 06/06/2018   Procedure: LEFT HIP WOUND DEHISCENCE POSSIBLE HEAD AND LINER EXCHANGE;  Surgeon: Rod Can, MD;  Location: [facility];  Service: Orthopedics;  Laterality: Left;   COLONOSCOPY  2015   DIAGNOSTIC LAPAROSCOPY     tubal preg-took ovary and tube   LAPAROSCOPY FOR ECTOPIC PREGNANCY  1990s   LEFT SALPINGECTOMY AND RIGHT OOPHORECTOMY FOR ABNORMALITY   MASS EXCISION  02/15/2012   Procedure: EXCISION MASS;  Surgeon: Schuyler Amor, MD;  Location: [facility];  Service: Orthopedics;  Laterality: Right;  Excision of Right Small Volar Mass   MYELOGRAM     TONSILLECTOMY  age 65   TOTAL HIP ARTHROPLASTY Left 04/25/2018   Procedure: TOTAL HIP ARTHROPLASTY ANTERIOR APPROACH;  Surgeon: Rod Can, MD;  Location: [facility];  Service: Orthopedics;  Laterality: Left;   TRIGGER FINGER RELEASE Right 2018   thumb   WISDOM TOOTH EXTRACTION  age 89    There were no vitals filed for  this visit.   Subjective Assessment - 12/21/20 0915     Subjective Pt presents to PT with no current reports of pain. Notes she had some muscle soreness after last treatment session, but it decreased quickly. Pt is ready to begin PT treatment at this time.    Currently in Pain? No/denies    Pain Score 0-No pain           [facility] Adult PT Treatment/Exercise:   Therapeutic Exercise:  NuStep lvl 6 UE/LE while taking subjective Heel Toe raise x 20 Standing hip abd/ext 2x10 ea green tband Lateral walk x 3 laps in // green  SLS x 30 sec ea STS x 10 - no UE support R ankle 4-way red tband x 10 ea Bridge 2x10 - 3 sec hold      [facility] PT Assessment - 12/21/20 0001       Observation/Other Assessments   Focus on Therapeutic Outcomes (FOTO)  65% function      Single Leg Stance   Comments 30 sec bilaterally      Sit to Stand   Comments 30 Sec STS: 7 reps                                    PT  Education - 12/21/20 0958     Education Details HEP update and discharge plan    Person(s) Educated Patient    Methods Explanation;Demonstration;Handout    Comprehension Verbalized understanding;Returned demonstration              PT Short Term Goals - 12/16/20 0943       PT SHORT TERM GOAL #1   Title Pt will be knowledgeable of initial HEP for improved comfort and carryover    Baseline initial HEP given    Time 3    Period Weeks    Status Achieved    Target Date 12/21/20               PT Long Term Goals - 12/21/20 0928       PT LONG TERM GOAL #1   Title Pt will improve FOTO to no less than 66% as proxy for functional improvement    Baseline 49% function; 65% function on 10/24    Time 8    Period Weeks    Status Achieved      PT LONG TERM GOAL #2   Title Pt will improve SLS to no less than 20 sec ea for improved balance and mobility    Baseline R: 6 sec, L: 8 sec; goal met on 10/19    Time 8    Period Weeks    Status Achieved      PT  LONG TERM GOAL #3   Title Pt will improve 30 sec STS to no less than 8 reps for improved balance and mobility    Baseline 6 reps; 7 reps on 10/24    Time 8    Period Weeks    Status Partially Met      PT LONG TERM GOAL #4   Title Pt will self report R LE pain no greater than 3/10 at worst for improved comfort and function    Baseline 810 at worst    Time 8    Period Weeks    Status Achieved                   Plan - 12/21/20 1000     Clinical Impression Statement Pt was able to complete all prescribed exercises and demonstrated knowledge of HEP with no adverse effect. Over the course of PT, pt has greatly improved strength and functional mobility. She has had 0/10 pain the last few weeks and improved all LTGs as noted. Subjective she feels as though she is in a good place to continue to progress with HEP and is ready to discharge at this time. Pt should continue to improve with HEP compliance, which was updated and reviewed.    PT Treatment/Interventions ADLs/Self Care Home Management;Electrical Stimulation;Cryotherapy;Moist Heat;Gait training;Stair training;Functional mobility training;Therapeutic activities;Therapeutic exercise;Balance training;Neuromuscular re-education;Patient/family education;Manual techniques;Dry needling;Taping;Vasopneumatic Device    PT Home Exercise Plan Access Code: [email or web address]    Consulted and Agree with Plan of Care Patient             Patient will benefit from skilled therapeutic intervention in order to improve the following deficits and impairments:  Abnormal gait, Decreased activity tolerance, Decreased balance, Decreased endurance, Decreased mobility, Decreased strength, Pain, Difficulty walking  Visit Diagnosis: Pain in right leg  Other abnormalities of gait and mobility     Problem List Patient Active Problem List   Diagnosis Date Noted   Body mass index (BMI) 37.0-37.9, adult 03/14/2019   Lumbar stenosis without neurogenic  claudication 03/14/2019   Hyperlipidemia  02/08/2019   Lumbar spondylosis 10/25/2018   Degeneration of lumbar intervertebral disc 10/01/2018   Surgical wound dehiscence 06/06/2018   Osteoarthritis of left hip 04/25/2018   Spinal stenosis of lumbar region 02/23/2018   Trigger thumb of right hand 03/30/2017   Dyspnea 03/26/2013   Asthma vs VCD  02/28/2013   Essential (primary) hypertension 06/07/2012   Pyogenic granuloma 02/15/2012    Ward Chatters, PT 12/21/2020, 10:04 AM  [facility] [address] [place], [place], [address] Phone: [phone]   Fax:  [phone]  Name: Darlene Crosby MRN: [id number] Date of Birth: 11-Dec-1965   PHYSICAL THERAPY DISCHARGE SUMMARY  Visits from Start of Care: 6  Current functional level related to goals / functional outcomes: See goals and objective   Remaining deficits: See goals and objective   Education / Equipment: HEP   Patient agrees to discharge. Patient goals were  mostly met . Patient is being discharged due to being pleased with the current functional level.

## 2020-12-23 ENCOUNTER — Encounter: Payer: 59 | Admitting: Physical Therapy

## 2020-12-28 ENCOUNTER — Encounter: Payer: 59 | Admitting: Physical Therapy

## 2020-12-31 ENCOUNTER — Ambulatory Visit (INDEPENDENT_AMBULATORY_CARE_PROVIDER_SITE_OTHER): Payer: 59 | Admitting: Specialist

## 2020-12-31 ENCOUNTER — Ambulatory Visit (INDEPENDENT_AMBULATORY_CARE_PROVIDER_SITE_OTHER): Payer: 59

## 2020-12-31 ENCOUNTER — Encounter: Payer: Self-pay | Admitting: Specialist

## 2020-12-31 VITALS — BP 147/91 | HR 78 | Ht 65.0 in | Wt 220.0 lb

## 2020-12-31 DIAGNOSIS — M47816 Spondylosis without myelopathy or radiculopathy, lumbar region: Secondary | ICD-10-CM | POA: Diagnosis not present

## 2020-12-31 DIAGNOSIS — M48062 Spinal stenosis, lumbar region with neurogenic claudication: Secondary | ICD-10-CM

## 2020-12-31 DIAGNOSIS — M5136 Other intervertebral disc degeneration, lumbar region: Secondary | ICD-10-CM

## 2020-12-31 DIAGNOSIS — M51369 Other intervertebral disc degeneration, lumbar region without mention of lumbar back pain or lower extremity pain: Secondary | ICD-10-CM

## 2020-12-31 MED ORDER — PREGABALIN 50 MG PO CAPS: 50.0000 mg | ORAL_CAPSULE | Freq: Every day | ORAL | 2 refills | Status: DC

## 2020-12-31 MED ORDER — DICLOFENAC SODIUM 50 MG PO TBEC: 50.0000 mg | DELAYED_RELEASE_TABLET | Freq: Two times a day (BID) | ORAL | 2 refills | Status: DC

## 2020-12-31 NOTE — Progress Notes (Signed)
Office Visit Note   Patient: Darlene Crosby           Date of Birth: 18-Feb-1966           MRN: [id number] Visit Date: 12/31/2020              Requested by: Camillia Herter, NP [address],  [place] [address] PCP: Camillia Herter, NP   Assessment & Plan: Visit Diagnoses:  1. Degeneration of lumbar intervertebral disc   2. Lumbar spondylosis   3. Spinal stenosis of lumbar region with neurogenic claudication     Plan: Avoid bending, stooping and avoid lifting weights greater than 10 lbs. Avoid prolong standing and walking. Avoid frequent bending and stooping  No lifting greater than 10 lbs. May use ice or moist heat for pain. Weight loss is of benefit. Handicap license is approved. Dr. Romona Curls secretary/Assistant will call to arrange for facet blocks left L1-2, L2-3, L3-4 and L4-5, consideration of radiofrequency ablation of the medial nerve branches.  Lyrica 50 mg qhs.     Follow-Up Instructions: Return in about 4 weeks (around 01/28/2021).   Orders:  Orders Placed This Encounter  Procedures   XR Lumb Spine Flex&Ext Only   No orders of the defined types were placed in this encounter.     Procedures: No procedures performed   Clinical Data: No additional findings.   Subjective: Chief Complaint  Patient presents with   Lower Back - Follow-up, Pain    States that she has back oain that raditates up the back, she states that she has spinal stenosis, 2 bulging disc, and 2 degenerative discs,     55 year old female with history of back pain and previous left anterior THR. She has had pain for years and has seen Dr. Nelva Bush and Dr. Rolena Infante Was told she has spinal narrowing and no xrays. She underwent injection treatments of the lumbar spine. ESIs about 8 have been done. She tried PT and Dr. Tonita Cong treated her with a back brace and then PT. She has restarted using a brace. There is pain into the back with prolong standing and walking and picking up  items on the floor is painful. She can walk a half mile at the most and the pain is present with just walking in from the car. There is lumbar stiffness and difficulty straightening up her back. She uses a walker to get her balance in the AM.   Review of Systems  Constitutional:  Negative for activity change, appetite change, chills, diaphoresis, fatigue, fever and unexpected weight change.  HENT:  Positive for dental problem. Negative for congestion, drooling, ear discharge, ear pain, facial swelling, hearing loss, mouth sores, nosebleeds, postnasal drip, rhinorrhea, sinus pressure, sinus pain, sneezing, sore throat, tinnitus, trouble swallowing and voice change.   Eyes:  Positive for visual disturbance.  Respiratory:  Positive for apnea, shortness of breath and wheezing. Negative for cough, choking, chest tightness and stridor.   Cardiovascular:  Positive for leg swelling. Negative for chest pain and palpitations.  Gastrointestinal: Negative.  Negative for abdominal distention, abdominal pain, anal bleeding, blood in stool, constipation, diarrhea, nausea, rectal pain and vomiting.  Endocrine: Positive for cold intolerance.  Genitourinary: Negative.   Musculoskeletal:  Positive for back pain and gait problem. Negative for arthralgias, joint swelling, myalgias, neck pain and neck stiffness.  Skin: Negative.   Allergic/Immunologic: Negative.   Neurological:  Positive for weakness and numbness.  Hematological: Negative.  Negative for adenopathy.  Does not bruise/bleed easily.  Psychiatric/Behavioral:  Positive for sleep disturbance. Negative for agitation, behavioral problems, confusion, decreased concentration, dysphoric mood, hallucinations, self-injury and suicidal ideas. The patient is not nervous/anxious and is not hyperactive.     Objective: Vital Signs: BP (!) 147/91 (BP Location: Left Arm, Patient Position: Sitting)   Pulse 78   Ht 5\' 5"  (1.651 m)   Wt 220 lb (99.8 kg)   LMP 05/04/2012    BMI 36.61 kg/m   Physical Exam  Ortho Exam  Specialty Comments:  No specialty comments available.  Imaging: XR Lumb Spine Flex&Ext Only  Result Date: 12/31/2020 AP and lateral flexion and extension radiographs show moderately severe DDD T12-L1, L1-2, L2-3, L3-4 and L4-5. There is significant spondylosis changes in the facets at multiple levels .     PMFS History: Patient Active Problem List   Diagnosis Date Noted   Body mass index (BMI) 37.0-37.9, adult 03/14/2019   Lumbar stenosis without neurogenic claudication 03/14/2019   Hyperlipidemia 02/08/2019   Lumbar spondylosis 10/25/2018   Degeneration of lumbar intervertebral disc 10/01/2018   Surgical wound dehiscence 06/06/2018   Osteoarthritis of left hip 04/25/2018   Spinal stenosis of lumbar region 02/23/2018   Trigger thumb of right hand 03/30/2017   Dyspnea 03/26/2013   Asthma vs VCD  02/28/2013   Essential (primary) hypertension 06/07/2012   Pyogenic granuloma 02/15/2012   Past Medical History:  Diagnosis Date   Asthma    followed by pcp   Hypertension    OA (osteoarthritis)    left knee, right shoulder, left hip   Seasonal allergies    Wears glasses     Family History  Problem Relation Age of Onset   Hypertension Mother    Asthma Mother    Allergies Mother    Breast cancer Sister    Heart disease Sister    Hypertension Sister    Stroke Sister    Diabetes Sister    Colon cancer Neg Hx    Esophageal cancer Neg Hx    Stomach cancer Neg Hx    Pancreatic cancer Neg Hx     Past Surgical History:  Procedure Laterality Date   ANTERIOR HIP REVISION Left 06/06/2018   Procedure: LEFT HIP WOUND DEHISCENCE POSSIBLE HEAD AND LINER EXCHANGE;  Surgeon: Rod Can, MD;  Location: [facility];  Service: Orthopedics;  Laterality: Left;   COLONOSCOPY  2015   DIAGNOSTIC LAPAROSCOPY     tubal preg-took ovary and tube   LAPAROSCOPY FOR ECTOPIC PREGNANCY  1990s   LEFT SALPINGECTOMY AND RIGHT OOPHORECTOMY FOR  ABNORMALITY   MASS EXCISION  02/15/2012   Procedure: EXCISION MASS;  Surgeon: Schuyler Amor, MD;  Location: [facility];  Service: Orthopedics;  Laterality: Right;  Excision of Right Small Volar Mass   MYELOGRAM     TONSILLECTOMY  age 1   TOTAL HIP ARTHROPLASTY Left 04/25/2018   Procedure: TOTAL HIP ARTHROPLASTY ANTERIOR APPROACH;  Surgeon: Rod Can, MD;  Location: [facility];  Service: Orthopedics;  Laterality: Left;   TRIGGER FINGER RELEASE Right 2018   thumb   WISDOM TOOTH EXTRACTION  age 66   Social History   Occupational History   Not on file  Tobacco Use   Smoking status: Never   Smokeless tobacco: Never  Vaping Use   Vaping Use: Never used  Substance and Sexual Activity   Alcohol use: No   Drug use: Never   Sexual activity: Not on file

## 2021-01-09 ENCOUNTER — Other Ambulatory Visit: Payer: Self-pay | Admitting: Family

## 2021-01-09 DIAGNOSIS — E785 Hyperlipidemia, unspecified: Secondary | ICD-10-CM

## 2021-01-09 DIAGNOSIS — I10 Essential (primary) hypertension: Secondary | ICD-10-CM

## 2021-01-29 ENCOUNTER — Other Ambulatory Visit: Payer: Self-pay

## 2021-01-29 ENCOUNTER — Encounter: Payer: Self-pay | Admitting: Specialist

## 2021-01-29 ENCOUNTER — Ambulatory Visit (INDEPENDENT_AMBULATORY_CARE_PROVIDER_SITE_OTHER): Payer: 59 | Admitting: Specialist

## 2021-01-29 VITALS — BP 146/88 | HR 84 | Ht 65.0 in | Wt 220.0 lb

## 2021-01-29 DIAGNOSIS — M51369 Other intervertebral disc degeneration, lumbar region without mention of lumbar back pain or lower extremity pain: Secondary | ICD-10-CM

## 2021-01-29 DIAGNOSIS — M5136 Other intervertebral disc degeneration, lumbar region: Secondary | ICD-10-CM

## 2021-01-29 DIAGNOSIS — Q7649 Other congenital malformations of spine, not associated with scoliosis: Secondary | ICD-10-CM | POA: Diagnosis not present

## 2021-01-29 DIAGNOSIS — M47816 Spondylosis without myelopathy or radiculopathy, lumbar region: Secondary | ICD-10-CM

## 2021-01-29 DIAGNOSIS — M48062 Spinal stenosis, lumbar region with neurogenic claudication: Secondary | ICD-10-CM | POA: Diagnosis not present

## 2021-01-29 DIAGNOSIS — Z96642 Presence of left artificial hip joint: Secondary | ICD-10-CM

## 2021-01-29 NOTE — Patient Instructions (Signed)
Plan: Avoid bending, stooping and avoid lifting weights greater than 10 lbs. Avoid prolong standing and walking. Avoid frequent bending and stooping  No lifting greater than 10 lbs. May use ice or moist heat for pain. Weight loss is of benefit. Handicap license is approved. Dr. Romona Curls secretary/Assistant will call to arrange for facet blocks left L1-2, L2-3, L3-4 and L4-5, consideration of radiofrequency ablation of the medial nerve branches.  Lyrica 50 mg qhs.

## 2021-01-29 NOTE — Progress Notes (Signed)
Office Visit Note   Patient: Darlene Crosby           Date of Birth: 09/18/1965           MRN: [id number] Visit Date: 01/29/2021              Requested by: Camillia Herter, NP [address],  [place] [address] PCP: Camillia Herter, NP   Assessment & Plan: Visit Diagnoses:  1. Spinal stenosis of lumbar region with neurogenic claudication   2. Lumbar spondylosis   3. Degeneration of lumbar intervertebral disc   4. Congenital spinal stenosis of lumbar region   5. History of total left hip replacement     Plan: Avoid bending, stooping and avoid lifting weights greater than 10 lbs. Avoid prolong standing and walking. Avoid frequent bending and stooping  No lifting greater than 10 lbs. May use ice or moist heat for pain. Weight loss is of benefit. Handicap license is approved. Dr. Romona Curls secretary/Assistant will call to arrange for facet blocks left L1-2, L2-3, L3-4 and L4-5, consideration of radiofrequency ablation of the medial nerve branches.  Lyrica 50 mg qhs.     Follow-Up Instructions: No follow-ups on file.   Orders:  No orders of the defined types were placed in this encounter.  No orders of the defined types were placed in this encounter.     Procedures: No procedures performed   Clinical Data: No additional findings.   Subjective: Chief Complaint  Patient presents with   Lower Back - Follow-up    States that her injection has been scheduled for 02/17/2021 with Dr. Ernestina Patches.    55 year old female with history of back pain with some into the buttocks and thighs. She is unable to walk more than 200 feet due to leg and thigh fatigue. She uses a back brace to help with pain. Takes hot showers and baths. No bowel or bladder incontinence. Pain is worse with prolong standing and walking and with bending stooping and lifting. She reports the pain is about a nine of 10. She has pain into the back at night uses medication to facilitate sleeping.  She was done part time work Friday and Saturday night for a 2 x12 hour shifts.  She is applying for SSDI and I support her application.   Review of Systems  Constitutional: Negative.   HENT: Negative.    Eyes: Negative.   Respiratory: Negative.    Cardiovascular: Negative.   Gastrointestinal: Negative.   Endocrine: Negative.   Genitourinary: Negative.   Musculoskeletal: Negative.   Skin: Negative.   Allergic/Immunologic: Negative.   Neurological: Negative.   Hematological: Negative.   Psychiatric/Behavioral: Negative.      Objective: Vital Signs: BP (!) 146/88 (BP Location: Left Arm, Patient Position: Sitting)   Pulse 84   Ht 5\' 5"  (1.651 m)   Wt 220 lb (99.8 kg)   LMP 05/04/2012   BMI 36.61 kg/m   Physical Exam Constitutional:      Appearance: She is well-developed.  HENT:     Head: Normocephalic and atraumatic.  Eyes:     Pupils: Pupils are equal, round, and reactive to light.  Pulmonary:     Effort: Pulmonary effort is normal.     Breath sounds: Normal breath sounds.  Abdominal:     General: Bowel sounds are normal.     Palpations: Abdomen is soft.  Musculoskeletal:     Cervical back: Normal range of motion and neck supple.  Lumbar back: Negative right straight leg raise test and negative left straight leg raise test.  Skin:    General: Skin is warm and dry.  Neurological:     Mental Status: She is alert and oriented to person, place, and time.  Psychiatric:        Behavior: Behavior normal.        Thought Content: Thought content normal.        Judgment: Judgment normal.   Back Exam   Tenderness  The patient is experiencing tenderness in the lumbar.  Range of Motion  Extension:  abnormal  Flexion:  abnormal  Lateral bend right:  abnormal  Lateral bend left:  abnormal  Rotation right:  abnormal  Rotation left:  abnormal   Muscle Strength  Right Quadriceps:  5/5  Left Quadriceps:  5/5  Right Hamstrings:  5/5  Left Hamstrings:  5/5   Tests   Straight leg raise right: negative Straight leg raise left: negative  Reflexes  Patellar:  1/4 Achilles:  1/4 Babinski's sign: normal   Other  Toe walk: normal Heel walk: normal Sensation: normal Gait: abnormal     Specialty Comments:  No specialty comments available.  Imaging: No results found.   PMFS History: Patient Active Problem List   Diagnosis Date Noted   Body mass index (BMI) 37.0-37.9, adult 03/14/2019   Lumbar stenosis without neurogenic claudication 03/14/2019   Hyperlipidemia 02/08/2019   Lumbar spondylosis 10/25/2018   Degeneration of lumbar intervertebral disc 10/01/2018   Surgical wound dehiscence 06/06/2018   Osteoarthritis of left hip 04/25/2018   Spinal stenosis of lumbar region 02/23/2018   Trigger thumb of right hand 03/30/2017   Dyspnea 03/26/2013   Asthma vs VCD  02/28/2013   Essential (primary) hypertension 06/07/2012   Pyogenic granuloma 02/15/2012   Past Medical History:  Diagnosis Date   Asthma    followed by pcp   Hypertension    OA (osteoarthritis)    left knee, right shoulder, left hip   Seasonal allergies    Wears glasses     Family History  Problem Relation Age of Onset   Hypertension Mother    Asthma Mother    Allergies Mother    Breast cancer Sister    Heart disease Sister    Hypertension Sister    Stroke Sister    Diabetes Sister    Colon cancer Neg Hx    Esophageal cancer Neg Hx    Stomach cancer Neg Hx    Pancreatic cancer Neg Hx     Past Surgical History:  Procedure Laterality Date   ANTERIOR HIP REVISION Left 06/06/2018   Procedure: LEFT HIP WOUND DEHISCENCE POSSIBLE HEAD AND LINER EXCHANGE;  Surgeon: Rod Can, MD;  Location: [facility];  Service: Orthopedics;  Laterality: Left;   COLONOSCOPY  2015   DIAGNOSTIC LAPAROSCOPY     tubal preg-took ovary and tube   LAPAROSCOPY FOR ECTOPIC PREGNANCY  1990s   LEFT SALPINGECTOMY AND RIGHT OOPHORECTOMY FOR ABNORMALITY   MASS EXCISION  02/15/2012   Procedure:  EXCISION MASS;  Surgeon: Schuyler Amor, MD;  Location: [facility];  Service: Orthopedics;  Laterality: Right;  Excision of Right Small Volar Mass   MYELOGRAM     TONSILLECTOMY  age 33   TOTAL HIP ARTHROPLASTY Left 04/25/2018   Procedure: TOTAL HIP ARTHROPLASTY ANTERIOR APPROACH;  Surgeon: Rod Can, MD;  Location: [facility];  Service: Orthopedics;  Laterality: Left;   TRIGGER FINGER RELEASE Right 2018   thumb  WISDOM TOOTH EXTRACTION  age 56   Social History   Occupational History   Not on file  Tobacco Use   Smoking status: Never   Smokeless tobacco: Never  Vaping Use   Vaping Use: Never used  Substance and Sexual Activity   Alcohol use: No   Drug use: Never   Sexual activity: Not on file

## 2021-02-12 ENCOUNTER — Ambulatory Visit: Payer: 59 | Admitting: Orthopaedic Surgery

## 2021-02-15 NOTE — Progress Notes (Signed)
Patient ID: Darlene Crosby, female    DOB: 10/19/1965  MRN: [id number]  CC: Hypertension Follow-Up   Subjective: Darlene Crosby is a 55 y.o. female who presents for hypertension follow-up.   Her concerns today include:   HYPERTENSION FOLLOW-UP: 11/23/2020:  - Home blood pressures at goal.  - Continue Amlodipine as prescribed.  - Patient intolerant to Lisinopril, Losartan, and Hydrochlorothiazide.  02/23/2021: Doing well on current regimen. No side effects. No issues/concerns. Denies chest pain and shortness of breath. Home blood pressures  mostly 130's/70's-80's.  2. HYPERLIPIDEMIA FOLLOW-UP: Reports Atorvastatin caused rash on legs. Since then quit taking and has resolved. Reports tolerated Rosuvastatin better.  Patient Active Problem List   Diagnosis Date Noted   Body mass index (BMI) 37.0-37.9, adult 03/14/2019   Lumbar stenosis without neurogenic claudication 03/14/2019   Hyperlipidemia 02/08/2019   Lumbar spondylosis 10/25/2018   Degeneration of lumbar intervertebral disc 10/01/2018   Surgical wound dehiscence 06/06/2018   Osteoarthritis of left hip 04/25/2018   Spinal stenosis of lumbar region 02/23/2018   Pain in left knee 01/10/2018   Trigger thumb of right hand 03/30/2017   Dyspnea 03/26/2013   Asthma vs VCD  02/28/2013   Hypertensive disorder 06/07/2012   Pyogenic granuloma 02/15/2012     Current Outpatient Medications on File Prior to Visit  Medication Sig Dispense Refill   albuterol (PROVENTIL) (2.5 MG/3ML) 0.083% nebulizer solution Inhale 2.5 mg into the lungs every 4 (four) hours as needed for wheezing or shortness of breath.      albuterol (VENTOLIN HFA) 108 (90 Base) MCG/ACT inhaler Inhale 2 puffs into the lungs every 4 (four) hours as needed (wheezing and SOB).      atorvastatin (LIPITOR) 40 MG tablet Take 1 tablet by mouth once daily 90 tablet 0   cyclobenzaprine (FLEXERIL) 10 MG tablet Take 10 mg by mouth daily as needed.     diclofenac (VOLTAREN)  50 MG EC tablet Take 1 tablet (50 mg total) by mouth 2 (two) times daily. 60 tablet 2   levocetirizine (XYZAL) 5 MG tablet Take 5 mg by mouth every evening.     Multiple Vitamin (MULTIVITAMIN) tablet Take 1 tablet by mouth daily.     pregabalin (LYRICA) 50 MG capsule Take 1 capsule (50 mg total) by mouth daily. 30 capsule 2   Current Facility-Administered Medications on File Prior to Visit  Medication Dose Route Frequency Provider Last Rate Last Admin   methylPREDNISolone acetate (DEPO-MEDROL) injection 80 mg  80 mg Other Once Magnus Sinning, MD        Allergies  Allergen Reactions   Pineapple Shortness Of Breath, Swelling and Hives    Swelling of tongue    Chocolate Hives   Coconut Oil Hives and Rash   Strawberry Extract Hives   Wheat Bran Hives    Social History   Socioeconomic History   Marital status: Widowed    Spouse name: Not on file   Number of children: Not on file   Years of education: Not on file   Highest education level: Not on file  Occupational History   Not on file  Tobacco Use   Smoking status: Never   Smokeless tobacco: Never  Vaping Use   Vaping Use: Never used  Substance and Sexual Activity   Alcohol use: No   Drug use: Never   Sexual activity: Not on file  Other Topics Concern   Not on file  Social History Narrative   Not on file   Social  Determinants of Health   Financial Resource Strain: Not on file  Food Insecurity: Not on file  Transportation Needs: Not on file  Physical Activity: Not on file  Stress: Not on file  Social Connections: Not on file  Intimate Partner Violence: Not on file    Family History  Problem Relation Age of Onset   Hypertension Mother    Asthma Mother    Allergies Mother    Breast cancer Sister    Heart disease Sister    Hypertension Sister    Stroke Sister    Diabetes Sister    Colon cancer Neg Hx    Esophageal cancer Neg Hx    Stomach cancer Neg Hx    Pancreatic cancer Neg Hx     Past Surgical  History:  Procedure Laterality Date   ANTERIOR HIP REVISION Left 06/06/2018   Procedure: LEFT HIP WOUND DEHISCENCE POSSIBLE HEAD AND LINER EXCHANGE;  Surgeon: Rod Can, MD;  Location: [facility];  Service: Orthopedics;  Laterality: Left;   COLONOSCOPY  2015   DIAGNOSTIC LAPAROSCOPY     tubal preg-took ovary and tube   LAPAROSCOPY FOR ECTOPIC PREGNANCY  1990s   LEFT SALPINGECTOMY AND RIGHT OOPHORECTOMY FOR ABNORMALITY   MASS EXCISION  02/15/2012   Procedure: EXCISION MASS;  Surgeon: Schuyler Amor, MD;  Location: [facility];  Service: Orthopedics;  Laterality: Right;  Excision of Right Small Volar Mass   MYELOGRAM     TONSILLECTOMY  age 55   TOTAL HIP ARTHROPLASTY Left 04/25/2018   Procedure: TOTAL HIP ARTHROPLASTY ANTERIOR APPROACH;  Surgeon: Rod Can, MD;  Location: [facility];  Service: Orthopedics;  Laterality: Left;   TRIGGER FINGER RELEASE Right 2018   thumb   WISDOM TOOTH EXTRACTION  age 38    ROS: Review of Systems Negative except as stated above  PHYSICAL EXAM: BP 137/89 (BP Location: Left Arm, Patient Position: Sitting, Cuff Size: Large)    Pulse 82    Temp 98.9 F (37.2 C)    Resp 18    Ht 5\' 5"  (1.651 m)    Wt 229 lb (103.9 kg)    LMP 05/04/2012    SpO2 97%    BMI 38.11 kg/m   Physical Exam HENT:     Head: Normocephalic and atraumatic.  Eyes:     Extraocular Movements: Extraocular movements intact.     Conjunctiva/sclera: Conjunctivae normal.     Pupils: Pupils are equal, round, and reactive to light.  Cardiovascular:     Rate and Rhythm: Normal rate and regular rhythm.     Pulses: Normal pulses.     Heart sounds: Normal heart sounds.  Pulmonary:     Effort: Pulmonary effort is normal.     Breath sounds: Normal breath sounds.  Musculoskeletal:     Cervical back: Normal range of motion and neck supple.  Neurological:     General: No focal deficit present.     Mental Status: She is alert and oriented to person, place, and time.   Psychiatric:        Mood and Affect: Mood normal.        Behavior: Behavior normal.   ASSESSMENT AND PLAN: 1. Essential hypertension: - Continue Amlodipine as prescribed.  - Counseled on blood pressure goal of less than 130/80, low-sodium, DASH diet, medication compliance, 150 minutes of moderate intensity exercise per week as tolerated. Discussed medication compliance, adverse effects. - Update BMP - Follow-up with primary provider in 3 months or sooner if needed. -  Basic Metabolic Panel - amLODipine (NORVASC) 5 MG tablet; Take 1 tablet (5 mg total) by mouth daily.  Dispense: 90 tablet; Refill: 0  2. Hyperlipidemia, unspecified hyperlipidemia type: - Update lipid panel. Will update and refill Rosuvastatin once lipid panel returns.  - Follow-up with primary provider as scheduled. - Lipid Panel  3. Prediabetes: - Repeat screening for prediabetes. Last hemoglobin A1c 5.9% on 08/25/2020. - Hemoglobin A1c    Patient was given the opportunity to ask questions.  Patient verbalized understanding of the plan and was able to repeat key elements of the plan. Patient was given clear instructions to go to Emergency Department or return to medical center if symptoms don't improve, worsen, or new problems develop.The patient verbalized understanding.   Orders Placed This Encounter  Procedures   Basic Metabolic Panel   Lipid Panel   Hemoglobin A1c     Requested Prescriptions   Signed Prescriptions Disp Refills   amLODipine (NORVASC) 5 MG tablet 90 tablet 0    Sig: Take 1 tablet (5 mg total) by mouth daily.    Return in about 3 months (around 05/24/2021) for Follow-Up or next available hypertension .  Camillia Herter, NP

## 2021-02-17 ENCOUNTER — Ambulatory Visit: Payer: Self-pay

## 2021-02-17 ENCOUNTER — Ambulatory Visit (INDEPENDENT_AMBULATORY_CARE_PROVIDER_SITE_OTHER): Payer: 59 | Admitting: Physical Medicine and Rehabilitation

## 2021-02-17 ENCOUNTER — Encounter: Payer: Self-pay | Admitting: Physical Medicine and Rehabilitation

## 2021-02-17 ENCOUNTER — Other Ambulatory Visit: Payer: Self-pay

## 2021-02-17 VITALS — BP 159/94 | HR 87

## 2021-02-17 DIAGNOSIS — M47816 Spondylosis without myelopathy or radiculopathy, lumbar region: Secondary | ICD-10-CM | POA: Diagnosis not present

## 2021-02-17 MED ORDER — METHYLPREDNISOLONE ACETATE 80 MG/ML IJ SUSP: 80.0000 mg | Freq: Once | INTRAMUSCULAR | Status: DC

## 2021-02-17 NOTE — Progress Notes (Signed)
Pt state lower back with  the left side hurt the worse. Pt state standing and sitting to long makes the pain worse. Pt state she uses ice and take pain meds to help ease her pain.  Numeric Pain Rating Scale and Functional Assessment Average Pain 8   In the last MONTH (on 0-10 scale) has pain interfered with the following?  1. General activity like being  able to carry out your everyday physical activities such as walking, climbing stairs, carrying groceries, or moving a chair?  Rating(10)   +Driver, -BT, -Dye Allergies.

## 2021-02-17 NOTE — Patient Instructions (Signed)

## 2021-02-23 ENCOUNTER — Other Ambulatory Visit: Payer: Self-pay

## 2021-02-23 ENCOUNTER — Encounter: Payer: Self-pay | Admitting: Family

## 2021-02-23 ENCOUNTER — Ambulatory Visit: Payer: 59 | Admitting: Family

## 2021-02-23 VITALS — BP 137/89 | HR 82 | Temp 98.9°F | Resp 18 | Ht 65.0 in | Wt 229.0 lb

## 2021-02-23 DIAGNOSIS — E785 Hyperlipidemia, unspecified: Secondary | ICD-10-CM

## 2021-02-23 DIAGNOSIS — I10 Essential (primary) hypertension: Secondary | ICD-10-CM

## 2021-02-23 DIAGNOSIS — R7303 Prediabetes: Secondary | ICD-10-CM | POA: Diagnosis not present

## 2021-02-23 MED ORDER — AMLODIPINE BESYLATE 5 MG PO TABS: 5.0000 mg | ORAL_TABLET | Freq: Every day | ORAL | 0 refills | Status: DC

## 2021-02-23 NOTE — Progress Notes (Signed)
Pt presents for hypertension follow-up, pt states that atorvastatin is causing rash on right leg, states she would like to switch back to rosuvastatin

## 2021-02-24 ENCOUNTER — Other Ambulatory Visit: Payer: Self-pay | Admitting: Family

## 2021-02-24 DIAGNOSIS — E785 Hyperlipidemia, unspecified: Secondary | ICD-10-CM

## 2021-02-24 LAB — BASIC METABOLIC PANEL
BUN/Creatinine Ratio: 24 — ABNORMAL HIGH (ref 9–23)
BUN: 20 mg/dL (ref 6–24)
CO2: 23 mmol/L (ref 20–29)
Calcium: 9.5 mg/dL (ref 8.7–10.2)
Chloride: 106 mmol/L (ref 96–106)
Creatinine, Ser: 0.83 mg/dL (ref 0.57–1.00)
Glucose: 87 mg/dL (ref 70–99)
Potassium: 4.3 mmol/L (ref 3.5–5.2)
Sodium: 143 mmol/L (ref 134–144)
eGFR: 82 mL/min/{1.73_m2} (ref >59)

## 2021-02-24 LAB — LIPID PANEL
Chol/HDL Ratio: 5.9 ratio — ABNORMAL HIGH (ref 0.0–4.4)
Cholesterol, Total: 207 mg/dL — ABNORMAL HIGH (ref 100–199)
HDL: 35 mg/dL — ABNORMAL LOW (ref >39)
LDL Chol Calc (NIH): 140 mg/dL — ABNORMAL HIGH (ref 0–99)
Triglycerides: 178 mg/dL — ABNORMAL HIGH (ref 0–149)
VLDL Cholesterol Cal: 32 mg/dL (ref 5–40)

## 2021-02-24 LAB — HEMOGLOBIN A1C
Est. average glucose Bld gHb Est-mCnc: 126 mg/dL
Hgb A1c MFr Bld: 6 % — ABNORMAL HIGH (ref 4.8–5.6)

## 2021-02-24 MED ORDER — ROSUVASTATIN CALCIUM 20 MG PO TABS: 20.0000 mg | ORAL_TABLET | Freq: Every day | ORAL | 0 refills | Status: DC

## 2021-02-24 NOTE — Progress Notes (Signed)
Kidney function normal.   Pre-diabetes slightly increased since 6 months ago. No medication needed as of present. Repeat screening in 6 months.   Cholesterol increased since 6 months ago. This is likely related to patient self-discontinued Atorvastatin related to side effect of rash. Resume Rosuvastatin as prescribed and repeat cholesterol check in 6 to 8 weeks. Reminder to practice low-fat heart healthy diet and at least 150 minutes of moderate intensity exercise weekly as tolerated.   The following is for provider reference only: The 10-year ASCVD risk score (Arnett DK, et al., 2019) is: 10.3%   Values used to calculate the score:     Age: 55 years     Sex: Female     Is Non-Hispanic African American: Yes     Diabetic: No     Tobacco smoker: No     Systolic Blood Pressure: 468 mmHg     Is BP treated: Yes     HDL Cholesterol: 35 mg/dL     Total Cholesterol: 207 mg/dL

## 2021-03-04 ENCOUNTER — Encounter: Payer: Self-pay | Admitting: Specialist

## 2021-03-04 ENCOUNTER — Ambulatory Visit (INDEPENDENT_AMBULATORY_CARE_PROVIDER_SITE_OTHER): Payer: 59 | Admitting: Specialist

## 2021-03-04 ENCOUNTER — Other Ambulatory Visit: Payer: Self-pay

## 2021-03-04 VITALS — BP 143/88 | HR 77 | Ht 65.0 in | Wt 229.0 lb

## 2021-03-04 DIAGNOSIS — M48062 Spinal stenosis, lumbar region with neurogenic claudication: Secondary | ICD-10-CM | POA: Diagnosis not present

## 2021-03-04 DIAGNOSIS — M47816 Spondylosis without myelopathy or radiculopathy, lumbar region: Secondary | ICD-10-CM | POA: Diagnosis not present

## 2021-03-04 DIAGNOSIS — M5136 Other intervertebral disc degeneration, lumbar region: Secondary | ICD-10-CM | POA: Diagnosis not present

## 2021-03-04 DIAGNOSIS — Z96642 Presence of left artificial hip joint: Secondary | ICD-10-CM

## 2021-03-04 DIAGNOSIS — Q7649 Other congenital malformations of spine, not associated with scoliosis: Secondary | ICD-10-CM | POA: Diagnosis not present

## 2021-03-04 DIAGNOSIS — M51369 Other intervertebral disc degeneration, lumbar region without mention of lumbar back pain or lower extremity pain: Secondary | ICD-10-CM

## 2021-03-04 MED ORDER — DICLOFENAC SODIUM 50 MG PO TBEC: 50.0000 mg | DELAYED_RELEASE_TABLET | Freq: Three times a day (TID) | ORAL | 2 refills | Status: DC

## 2021-03-04 NOTE — Progress Notes (Incomplete)
Office Visit Note   Patient: Darlene Crosby           Date of Birth: Dec 16, 1965           MRN: [id number] Visit Date: 03/04/2021              Requested by: Camillia Herter, NP [place],  [place] [address] PCP: Camillia Herter, NP   Assessment & Plan: Visit Diagnoses: No diagnosis found.  Plan: ***  Follow-Up Instructions: No follow-ups on file.   Orders:  No orders of the defined types were placed in this encounter.  No orders of the defined types were placed in this encounter.     Procedures: No procedures performed   Clinical Data: No additional findings.   Subjective: Chief Complaint  Patient presents with   Lower Back - Follow-up    Follow up after medial branch blocks    56 year old female with history of back pain and left greater than right sciatica, left leg feels heavy 100 lbs. She underwent left THR in the past and this helped alittle, she is still having to help to move the left leg to get in and out of cars. No bowel or bladder difficult. She is going to pass her v  Review of Systems   Objective: Vital Signs: BP (!) 143/88 (BP Location: Left Arm, Patient Position: Sitting)    Pulse 77    Ht 5\' 5"  (1.651 m)    Wt 229 lb (103.9 kg)    LMP 05/04/2012    BMI 38.11 kg/m   Physical Exam  Ortho Exam  Specialty Comments:  No specialty comments available.  Imaging: No results found.   PMFS History: Patient Active Problem List   Diagnosis Date Noted   Body mass index (BMI) 37.0-37.9, adult 03/14/2019   Lumbar stenosis without neurogenic claudication 03/14/2019   Hyperlipidemia 02/08/2019   Lumbar spondylosis 10/25/2018   Degeneration of lumbar intervertebral disc 10/01/2018   Surgical wound dehiscence 06/06/2018   Osteoarthritis of left hip 04/25/2018   Spinal stenosis of lumbar region 02/23/2018   Pain in left knee 01/10/2018   Trigger thumb of right hand 03/30/2017   Dyspnea 03/26/2013   Asthma vs  VCD  02/28/2013   Hypertensive disorder 06/07/2012   Pyogenic granuloma 02/15/2012   Past Medical History:  Diagnosis Date   Asthma    followed by pcp   Hypertension    OA (osteoarthritis)    left knee, right shoulder, left hip   Seasonal allergies    Wears glasses     Family History  Problem Relation Age of Onset   Hypertension Mother    Asthma Mother    Allergies Mother    Breast cancer Sister    Heart disease Sister    Hypertension Sister    Stroke Sister    Diabetes Sister    Colon cancer Neg Hx    Esophageal cancer Neg Hx    Stomach cancer Neg Hx    Pancreatic cancer Neg Hx     Past Surgical History:  Procedure Laterality Date   ANTERIOR HIP REVISION Left 06/06/2018   Procedure: LEFT HIP WOUND DEHISCENCE POSSIBLE HEAD AND LINER EXCHANGE;  Surgeon: Rod Can, MD;  Location: [facility];  Service: Orthopedics;  Laterality: Left;   COLONOSCOPY  2015   DIAGNOSTIC LAPAROSCOPY     tubal preg-took ovary and tube   LAPAROSCOPY FOR ECTOPIC PREGNANCY  1990s   LEFT SALPINGECTOMY AND RIGHT  OOPHORECTOMY FOR ABNORMALITY   MASS EXCISION  02/15/2012   Procedure: EXCISION MASS;  Surgeon: Schuyler Amor, MD;  Location: [facility];  Service: Orthopedics;  Laterality: Right;  Excision of Right Small Volar Mass   MYELOGRAM     TONSILLECTOMY  age 66   TOTAL HIP ARTHROPLASTY Left 04/25/2018   Procedure: TOTAL HIP ARTHROPLASTY ANTERIOR APPROACH;  Surgeon: Rod Can, MD;  Location: [facility];  Service: Orthopedics;  Laterality: Left;   TRIGGER FINGER RELEASE Right 2018   thumb   WISDOM TOOTH EXTRACTION  age 61   Social History   Occupational History   Not on file  Tobacco Use   Smoking status: Never   Smokeless tobacco: Never  Vaping Use   Vaping Use: Never used  Substance and Sexual Activity   Alcohol use: No   Drug use: Never   Sexual activity: Not on file

## 2021-03-04 NOTE — Progress Notes (Signed)
Office Visit Note   Patient: Darlene Crosby           Date of Birth: Mar 14, 1965           MRN: [id number] Visit Date: 03/04/2021              Requested by: Camillia Herter, NP [place] [place],  [place] [address] PCP: Camillia Herter, NP   Assessment & Plan: Visit Diagnoses:  1. Spinal stenosis of lumbar region with neurogenic claudication   2. Lumbar spondylosis   3. Degeneration of lumbar intervertebral disc   4. Congenital spinal stenosis of lumbar region   5. History of total left hip replacement     Plan: Avoid bending, stooping and avoid lifting weights greater than 10 lbs. Avoid prolong standing and walking. Avoid frequent bending and stooping  No lifting greater than 10 lbs. May use ice or moist heat for pain. Weight loss is of benefit. Handicap license is approved. Physical therapy Consider repeat ablation or even SCS, at age 84 she is impaired due to her disease of the lumbar spine and The affects on her function.  She is disabled and unable to perform gainful employement. If surgery were Contemplated the results of surgery would be fusion of the entire lumbar spine with greater than 50% impairement and high likelihood that she would still be unable to perform work gainfully.   Follow-Up Instructions: No follow-ups on file.   Orders:  No orders of the defined types were placed in this encounter.  No orders of the defined types were placed in this encounter.     Procedures: No procedures performed   Clinical Data: No additional findings.   Subjective: Chief Complaint  Patient presents with   Lower Back - Follow-up    Follow up after medial branch blocks    56 year old female with history of back and leg pain for over 7 years, she has had previous left THR with some improvement but pain and heavy sensation left leg persists as does her need to assist the left leg in getting in and out of a vehicle. No bowel or bladder difficulties,  She reports increased frequency. PT previously at [facility] by Dr. Tonita Cong, and also [facility] by Dr. Nelva Bush. Standing and walking is limited to 200'  or 2-3 minutes standing. She alternates sitting and standing. She takes meds and goes to bed and not sure she has night pain. Pain with sitting and bending and well as standing and walking. Pain with riding in a car. Has to help to lift her leg to get in and out of a car. She is post left THR for arthrosis. Reports the left leg feels like it weights 100 lbs getting in and out of car.   Review of Systems  Constitutional: Negative.   HENT: Negative.    Eyes: Negative.   Respiratory: Negative.    Cardiovascular: Negative.   Gastrointestinal: Negative.   Endocrine: Negative.   Genitourinary: Negative.   Musculoskeletal: Negative.   Skin: Negative.   Allergic/Immunologic: Negative.   Neurological: Negative.   Hematological: Negative.   Psychiatric/Behavioral: Negative.      Objective: Vital Signs: BP (!) 143/88 (BP Location: Left Arm, Patient Position: Sitting)    Pulse 77    Ht 5\' 5"  (1.651 m)    Wt 229 lb (103.9 kg)    LMP 05/04/2012    BMI 38.11 kg/m   Physical Exam Constitutional:      Appearance:  She is well-developed.  HENT:     Head: Normocephalic and atraumatic.  Eyes:     Pupils: Pupils are equal, round, and reactive to light.  Pulmonary:     Effort: Pulmonary effort is normal.     Breath sounds: Normal breath sounds.  Abdominal:     General: Bowel sounds are normal.     Palpations: Abdomen is soft.  Musculoskeletal:     Cervical back: Normal range of motion and neck supple.     Lumbar back: Negative right straight leg raise test and negative left straight leg raise test.  Skin:    General: Skin is warm and dry.  Neurological:     Mental Status: She is alert and oriented to person, place, and time.  Psychiatric:        Behavior: Behavior normal.        Thought Content: Thought content normal.        Judgment: Judgment normal.    Back Exam   Tenderness  The patient is experiencing tenderness in the lumbar.  Range of Motion  Extension:  abnormal  Flexion:  abnormal  Lateral bend right:  abnormal  Lateral bend left:  abnormal  Rotation right:  abnormal   Muscle Strength  Right Quadriceps:  5/5  Left Quadriceps:  5/5  Right Hamstrings:  5/5  Left Hamstrings:  5/5   Tests  Straight leg raise right: negative Straight leg raise left: negative  Other  Toe walk: normal Heel walk: normal Sensation: normal  Comments:  Left hip flexion weakness    Specialty Comments:  No specialty comments available.  Imaging: No results found.   PMFS History: Patient Active Problem List   Diagnosis Date Noted   Body mass index (BMI) 37.0-37.9, adult 03/14/2019   Lumbar stenosis without neurogenic claudication 03/14/2019   Hyperlipidemia 02/08/2019   Lumbar spondylosis 10/25/2018   Degeneration of lumbar intervertebral disc 10/01/2018   Surgical wound dehiscence 06/06/2018   Osteoarthritis of left hip 04/25/2018   Spinal stenosis of lumbar region 02/23/2018   Pain in left knee 01/10/2018   Trigger thumb of right hand 03/30/2017   Dyspnea 03/26/2013   Asthma vs VCD  02/28/2013   Hypertensive disorder 06/07/2012   Pyogenic granuloma 02/15/2012   Past Medical History:  Diagnosis Date   Asthma    followed by pcp   Hypertension    OA (osteoarthritis)    left knee, right shoulder, left hip   Seasonal allergies    Wears glasses     Family History  Problem Relation Age of Onset   Hypertension Mother    Asthma Mother    Allergies Mother    Breast cancer Sister    Heart disease Sister    Hypertension Sister    Stroke Sister    Diabetes Sister    Colon cancer Neg Hx    Esophageal cancer Neg Hx    Stomach cancer Neg Hx    Pancreatic cancer Neg Hx     Past Surgical History:  Procedure Laterality Date   ANTERIOR HIP REVISION Left 06/06/2018   Procedure: LEFT HIP  WOUND DEHISCENCE POSSIBLE HEAD AND LINER EXCHANGE;  Surgeon: Rod Can, MD;  Location: [facility];  Service: Orthopedics;  Laterality: Left;   COLONOSCOPY  2015   DIAGNOSTIC LAPAROSCOPY     tubal preg-took ovary and tube   LAPAROSCOPY FOR ECTOPIC PREGNANCY  1990s   LEFT SALPINGECTOMY AND RIGHT OOPHORECTOMY FOR ABNORMALITY   MASS EXCISION  02/15/2012   Procedure: EXCISION  MASS;  Surgeon: Schuyler Amor, MD;  Location: [facility];  Service: Orthopedics;  Laterality: Right;  Excision of Right Small Volar Mass   MYELOGRAM     TONSILLECTOMY  age 20   TOTAL HIP ARTHROPLASTY Left 04/25/2018   Procedure: TOTAL HIP ARTHROPLASTY ANTERIOR APPROACH;  Surgeon: Rod Can, MD;  Location: [facility];  Service: Orthopedics;  Laterality: Left;   TRIGGER FINGER RELEASE Right 2018   thumb   WISDOM TOOTH EXTRACTION  age 75   Social History   Occupational History   Not on file  Tobacco Use   Smoking status: Never   Smokeless tobacco: Never  Vaping Use   Vaping Use: Never used  Substance and Sexual Activity   Alcohol use: No   Drug use: Never   Sexual activity: Not on file

## 2021-03-04 NOTE — Patient Instructions (Signed)
Plan: Avoid bending, stooping and avoid lifting weights greater than 10 lbs. Avoid prolong standing and walking. Avoid frequent bending and stooping  No lifting greater than 10 lbs. May use ice or moist heat for pain. Weight loss is of benefit. Handicap license is approved. Physical therapy Consider repeat ablation or even SCS, at age 56 she is impaired due to her disease of the lumbar spine and The affects on her function.  She is disabled and unable to perform gainful employement. If surgery were Contemplated the results of surgery would be fusion of the entire lumbar spine with greater than 50% impairement and high likelihood that she would still be unable to perform work gainfully.

## 2021-03-12 ENCOUNTER — Ambulatory Visit: Payer: 59 | Admitting: Rehabilitative and Restorative Service Providers"

## 2021-03-12 ENCOUNTER — Other Ambulatory Visit: Payer: Self-pay

## 2021-03-12 ENCOUNTER — Encounter: Payer: Self-pay | Admitting: Rehabilitative and Restorative Service Providers"

## 2021-03-12 DIAGNOSIS — M25552 Pain in left hip: Secondary | ICD-10-CM

## 2021-03-12 DIAGNOSIS — M6281 Muscle weakness (generalized): Secondary | ICD-10-CM

## 2021-03-12 DIAGNOSIS — M545 Low back pain, unspecified: Secondary | ICD-10-CM | POA: Diagnosis not present

## 2021-03-12 DIAGNOSIS — R262 Difficulty in walking, not elsewhere classified: Secondary | ICD-10-CM

## 2021-03-12 DIAGNOSIS — G8929 Other chronic pain: Secondary | ICD-10-CM

## 2021-03-12 DIAGNOSIS — R293 Abnormal posture: Secondary | ICD-10-CM

## 2021-03-12 NOTE — Therapy (Signed)
[facility] [address] [place], [place], [address] Phone: [phone]   Fax:  [phone]  Physical Therapy Evaluation  Patient Details  Name: Darlene Crosby MRN: [id number] Date of Birth: 1965/07/10 Referring Provider (PT): Jessy Oto, MD   Encounter Date: 03/12/2021   PT End of Session - 03/12/21 0932     Visit Number 1    Number of Visits 20    Date for PT Re-Evaluation 05/21/21    Progress Note Due on Visit 10    PT Start Time 0935    PT Stop Time 1008    PT Time Calculation (min) 33 min    Activity Tolerance Patient tolerated treatment well    Behavior During Therapy [facility] for tasks assessed/performed             Past Medical History:  Diagnosis Date   Asthma    followed by pcp   Hypertension    OA (osteoarthritis)    left knee, right shoulder, left hip   Seasonal allergies    Wears glasses     Past Surgical History:  Procedure Laterality Date   ANTERIOR HIP REVISION Left 06/06/2018   Procedure: LEFT HIP WOUND DEHISCENCE POSSIBLE HEAD AND LINER EXCHANGE;  Surgeon: Rod Can, MD;  Location: [facility];  Service: Orthopedics;  Laterality: Left;   COLONOSCOPY  2015   DIAGNOSTIC LAPAROSCOPY     tubal preg-took ovary and tube   LAPAROSCOPY FOR ECTOPIC PREGNANCY  1990s   LEFT SALPINGECTOMY AND RIGHT OOPHORECTOMY FOR ABNORMALITY   MASS EXCISION  02/15/2012   Procedure: EXCISION MASS;  Surgeon: Schuyler Amor, MD;  Location: [facility];  Service: Orthopedics;  Laterality: Right;  Excision of Right Small Volar Mass   MYELOGRAM     TONSILLECTOMY  age 42   TOTAL HIP ARTHROPLASTY Left 04/25/2018   Procedure: TOTAL HIP ARTHROPLASTY ANTERIOR APPROACH;  Surgeon: Rod Can, MD;  Location: [facility];  Service: Orthopedics;  Laterality: Left;   TRIGGER FINGER RELEASE Right 2018   thumb   WISDOM TOOTH EXTRACTION  age 45    There were no vitals filed for this visit.    Subjective Assessment - 03/12/21  0931     Subjective Pt. indicated complaints of back pain c limits in bending, stooping, squatting, prolonged activity.  Pt. indicated having trouble sleeping at times but able to sleep when taking medicine.  Works 3rd shift as [employer] with rounds every other hour, sitting included as well.   Pt. indicated feeling complaints similar for several years 2020 range onset.    Pertinent History Multiple lumbar DDD, mild lumbar spinal stenosis, left hip flexor weakness post left hip anterior THA 2020.   - seen Oct 2022 at [address] for Rt ankle    Limitations Standing;Walking;House hold activities;Lifting;Sitting    How long can you sit comfortably? 1 hr    How long can you stand comfortably? 20 mins    How long can you walk comfortably? 25 mins    Patient Stated Goals Reduce pain    Currently in Pain? Yes    Pain Score 8    at best 7/10, 9/10   Pain Location Back    Pain Orientation Lower    Pain Descriptors / Indicators Aching;Tightness    Pain Type Chronic pain    Pain Radiating Towards no numbness/tingling, no bowel or bladder changes    Pain Onset More than a month ago    Pain Frequency Constant  Aggravating Factors  Bending, stooping, squatting, standing prolonged, sitting prolonged, walking prolonged, housework    Pain Relieving Factors muscle relaxer/rest    Effect of Pain on Daily Activities Limited in work, household activity                [facility] PT Assessment - 03/12/21 0001       Assessment   Medical Diagnosis M48.062 (ICD-10-CM) - Spinal stenosis of lumbar region with neurogenic claudication  M47.816 (ICD-10-CM) - Lumbar spondylosis  M51.36 (ICD-10-CM) - Degeneration of lumbar intervertebral disc  Z96.642 (ICD-10-CM) - History of total left hip replacement    Referring Provider (PT) Jessy Oto, MD    Onset Date/Surgical Date 02/28/18    Hand Dominance Right      Precautions   Precautions None      Balance Screen   Has the patient fallen in the past 6 months No     Has the patient had a decrease in activity level because of a fear of falling?  No    Is the patient reluctant to leave their home because of a fear of falling?  No      Home Environment   Living Environment Private residence    Living Arrangements Alone    Type of [place] Access Level entry    [place] One level      Prior [place] Requirements Works in [place] requiring sitting, walking.    Leisure previously walking/cooking      Cognition   Overall Cognitive Status Within Functional Limits for tasks assessed      Observation/Other Assessments   Focus on Therapeutic Outcomes (FOTO)  intake 42%, predicted 49%      Sensation   Light Touch Appears Intact      Functional Tests   Functional tests Sit to Stand;Single leg stance      Single Leg Stance   Comments unable to maintain on either LE      Sit to Stand   Comments 18 inch chair s UE assist c increased difficulty      Posture/Postural Control   Posture Comments Reduced lumbar lordosis in standing      ROM / Strength   AROM / PROM / Strength Strength;PROM;AROM      AROM   Overall AROM Comments no centralization or peripherialization noted in lumbar movement.  Hip ER/IR measured in supine 90 deg flexion.  Noted deficits in active flexion movement of Lt hip    AROM Assessment Site Lumbar;Hip;Knee    Right/Left Hip Left;Right    Right Hip Flexion 90    Right Hip External Rotation  30    Right Hip Internal Rotation  30    Left Hip Flexion 45   limited by strength in Lt leg   Left Hip External Rotation  30    Left Hip Internal Rotation  28    Right/Left Knee Right;Left    Right Knee Extension 0    Left Knee Extension 0    Lumbar Flexion mid shin c pain increase in movement    Lumbar Extension 25% WFL c ERP lumbar (more than flexion)    Lumbar - Right Side Bend mid thigh c pain noted    Lumbar - Left Side Bend mid thigh c pain noted      PROM   Overall PROM Comments measured in supine     PROM Assessment Site Hip    Right/Left Hip Left;Right  Right Hip Flexion 95    Left Hip Flexion 95      Strength   Strength Assessment Site Hip;Knee;Ankle    Right/Left Hip Left;Right    Right Hip Flexion 5/5    Left Hip Flexion 2+/5    Right/Left Knee Right;Left    Right Knee Flexion 5/5    Right Knee Extension 5/5    Left Knee Flexion 5/5    Left Knee Extension 5/5    Right/Left Ankle Left;Right    Right Ankle Dorsiflexion 5/5    Left Ankle Dorsiflexion 5/5      Palpation   Palpation comment Tenderness L2-L5 central lumbar and lumbar paraspinals      Special Tests   Other special tests (-) slump, crossed SLR bilateral      Ambulation/Gait   Gait Comments Indepedent ambulation c mild forward trunk lean c longer distances (within household distances )                        Objective measurements completed on examination: See above findings.       [facility] Adult PT Treatment/Exercise - 03/12/21 0001       Exercises   Exercises Other Exercises;Lumbar    Other Exercises  HEP instruction/performance c cues for techniques, handout provided.  Trial set performed of each for comprehension and symptom assessment.      Lumbar Exercises: Stretches   Single Knee to Chest Stretch 30 seconds;2 reps;Left;Right   c strap assist   Lower Trunk Rotation 3 reps   15 seconds x bilateral     Lumbar Exercises: Seated   Other Seated Lumbar Exercises instruction for sit to stand to sit transfer c focus on eccentric lowering control      Lumbar Exercises: Supine   Bridge 10 reps    Other Supine Lumbar Exercises supine marching c TrA contraction x 10                     PT Education - 03/12/21 0931     Education Details HEP, POC    Person(s) Educated Patient    Methods Explanation;Demonstration;Handout;Verbal cues    Comprehension Verbalized understanding;Returned demonstration              PT Short Term Goals - 03/12/21 0932       PT SHORT  TERM GOAL #1   Title Patient will demonstrate independent use of home exercise program to maintain progress from in clinic treatments.    Time 3    Period Weeks    Status New    Target Date 04/02/21               PT Long Term Goals - 03/12/21 0932       PT LONG TERM GOAL #1   Title Patient will demonstrate/report pain at worst less than or equal to 2/10 to facilitate minimal limitation in daily activity secondary to pain symptoms.    Time 10    Period Weeks    Status New    Target Date 05/21/21      PT LONG TERM GOAL #2   Title Patient will demonstrate independent use of home exercise program to facilitate ability to maintain/progress functional gains from skilled physical therapy services.    Time 10    Period Weeks    Status New    Target Date 05/21/21      PT LONG TERM GOAL #3   Title Pt. will demonstrate  FOTO outcome > or = 49% to indicated reduced disability due to condition.    Time 10    Period Weeks    Status New    Target Date 05/21/21      PT LONG TERM GOAL #4   Title Pt. will demonstrate lumbar extension > or = 75% to facilitate upright standing/walking posture for home and work activity.    Time 10    Period Weeks    Status New    Target Date 05/21/21      PT LONG TERM GOAL #5   Title Pt. will demonstrate Lt hip flexion MMT > or = 4/5 to facilitate transfers, standing, walking at home/work.    Time 10    Period Weeks    Status New    Target Date 05/21/21      Additional Long Term Goals   Additional Long Term Goals Yes      PT LONG TERM GOAL #6   Title Pt. will demonstrate/report ability to walk/standing > 1 hr for work related tasks.    Time 10    Period Weeks    Status New    Target Date 05/21/21      PT LONG TERM GOAL #7   Title Pt. will demonstrate bilateral SLS > 10 seconds to facilitate stability in ambulation.    Time 10    Period Weeks    Status New    Target Date 05/21/21                    Plan - 03/12/21 1006      Clinical Impression Statement Patient is a 56 y.o. who comes to clinic with complaints of low back pain with mobility, strength and movement coordination deficits in back/hips that impair their ability to perform usual daily and recreational functional activities without increase difficulty/symptoms at this time.  Patient to benefit from skilled PT services to address impairments and limitations to improve to previous level of function without restriction secondary to condition.    Personal Factors and Comorbidities Other   Multiple lumbar DDD, mild lumbar spinal stenosis, left hip flexor weakness post left hip anterior THA   Examination-Activity Limitations Sit;Sleep;Bed Mobility;Squat;Bend;Stairs;Carry;Stand;Transfers;Dressing;Hygiene/Grooming;Lift;Locomotion Level;Reach Overhead    Examination-Participation Restrictions Occupation;Meal Prep;Laundry;Interpersonal Relationship;Driving;Community Activity;Cleaning    Stability/Clinical Decision Making Evolving/Moderate complexity    Clinical Decision Making Moderate    Rehab Potential --   fair to good   PT Frequency 2x / week    PT Duration Other (comment)   10 weeks   PT Treatment/Interventions ADLs/Self Care Home Management;Cryotherapy;Electrical Stimulation;Iontophoresis 4mg /ml Dexamethasone;Moist Heat;Balance training;Traction;Therapeutic exercise;Therapeutic activities;Functional mobility training;Stair training;Gait training;Ultrasound;Neuromuscular re-education;Patient/family education;Passive range of motion;Spinal Manipulations;Joint Manipulations;Dry needling;Taping;Manual techniques    PT Next Visit Plan Check hip abduction/extension strength bilateral.  Progressive lumbar/hip mobility improvements, Lt hip strengthening specifically in flexion, general activity tolerance improvements (nustep)    PT Home Exercise Plan [id number]    Consulted and Agree with Plan of Care Patient             Patient will benefit from skilled therapeutic  intervention in order to improve the following deficits and impairments:  Abnormal gait, Decreased endurance, Hypomobility, Decreased activity tolerance, Decreased strength, Pain, Difficulty walking, Decreased mobility, Decreased balance, Decreased range of motion, Impaired perceived functional ability, Improper body mechanics, Postural dysfunction, Impaired flexibility, Decreased coordination  Visit Diagnosis: Chronic bilateral low back pain without sciatica  Muscle weakness (generalized)  Pain in left hip  Abnormal posture  Difficulty in walking, not  elsewhere classified     Problem List Patient Active Problem List   Diagnosis Date Noted   Body mass index (BMI) 37.0-37.9, adult 03/14/2019   Lumbar stenosis without neurogenic claudication 03/14/2019   Hyperlipidemia 02/08/2019   Lumbar spondylosis 10/25/2018   Degeneration of lumbar intervertebral disc 10/01/2018   Surgical wound dehiscence 06/06/2018   Osteoarthritis of left hip 04/25/2018   Spinal stenosis of lumbar region 02/23/2018   Pain in left knee 01/10/2018   Trigger thumb of right hand 03/30/2017   Dyspnea 03/26/2013   Asthma vs VCD  02/28/2013   Hypertensive disorder 06/07/2012   Pyogenic granuloma 02/15/2012   Scot Jun, PT, DPT, OCS, ATC 03/12/21  10:22 AM    [facility] [address] [place], [place], [address] Phone: [phone]   Fax:  [phone]  Name: Darlene Crosby MRN: [id number] Date of Birth: 05/15/1965

## 2021-03-12 NOTE — Patient Instructions (Signed)
Access Code: [email or web address] URL: [email or web address] Date: 03/12/2021 Prepared by: Scot Jun  Exercises Supine Lower Trunk Rotation - 2 x daily - 7 x weekly - 1 sets - 5 reps - 15 hold Supine Single Knee to Chest Stretch - 2 x daily - 7 x weekly - 1 sets - 5 reps - 30 hold Supine March - 2 x daily - 7 x weekly - 3 sets - 10 reps Supine Bridge - 2 x daily - 7 x weekly - 3 sets - 10 reps - 2 hold Eccentric Squat - 1-2 x daily - 7 x weekly - 3 sets - 10 reps

## 2021-03-15 ENCOUNTER — Encounter: Payer: Self-pay | Admitting: Rehabilitative and Restorative Service Providers"

## 2021-03-15 ENCOUNTER — Ambulatory Visit (INDEPENDENT_AMBULATORY_CARE_PROVIDER_SITE_OTHER): Payer: 59 | Admitting: Rehabilitative and Restorative Service Providers"

## 2021-03-15 ENCOUNTER — Other Ambulatory Visit: Payer: Self-pay

## 2021-03-15 DIAGNOSIS — M6281 Muscle weakness (generalized): Secondary | ICD-10-CM | POA: Diagnosis not present

## 2021-03-15 DIAGNOSIS — M25552 Pain in left hip: Secondary | ICD-10-CM

## 2021-03-15 DIAGNOSIS — M545 Low back pain, unspecified: Secondary | ICD-10-CM

## 2021-03-15 DIAGNOSIS — R293 Abnormal posture: Secondary | ICD-10-CM

## 2021-03-15 DIAGNOSIS — G8929 Other chronic pain: Secondary | ICD-10-CM

## 2021-03-15 DIAGNOSIS — R262 Difficulty in walking, not elsewhere classified: Secondary | ICD-10-CM

## 2021-03-15 NOTE — Patient Instructions (Signed)
Access Code: [email or web address] URL: [email or web address] Date: 03/15/2021 Prepared by: Scot Jun  Exercises Supine Lower Trunk Rotation - 2 x daily - 7 x weekly - 1 sets - 5 reps - 15 hold Supine Single Knee to Chest Stretch - 2 x daily - 7 x weekly - 1 sets - 5 reps - 30 hold Supine March - 2 x daily - 7 x weekly - 3 sets - 10 reps Supine Bridge - 2 x daily - 7 x weekly - 3 sets - 10 reps - 2 hold Eccentric Squat - 1-2 x daily - 7 x weekly - 3 sets - 10 reps Clamshell - 1 x daily - 7 x weekly - 3 sets - 10 reps

## 2021-03-15 NOTE — Therapy (Signed)
[facility] Physical Therapy [address] [place], [place], [address] Phone: [phone]   Fax:  [phone]  Physical Therapy Treatment  Patient Details  Name: Darlene Crosby MRN: [id number] Date of Birth: 1965/07/25 Referring Provider (PT): Jessy Oto, MD   Encounter Date: 03/15/2021   PT End of Session - 03/15/21 0910     Visit Number 2    Number of Visits 20    Date for PT Re-Evaluation 05/21/21    Progress Note Due on Visit 10    PT Start Time 0846    PT Stop Time 0925    PT Time Calculation (min) 39 min    Activity Tolerance Patient tolerated treatment well    Behavior During Therapy [facility] for tasks assessed/performed             Past Medical History:  Diagnosis Date   Asthma    followed by pcp   Hypertension    OA (osteoarthritis)    left knee, right shoulder, left hip   Seasonal allergies    Wears glasses     Past Surgical History:  Procedure Laterality Date   ANTERIOR HIP REVISION Left 06/06/2018   Procedure: LEFT HIP WOUND DEHISCENCE POSSIBLE HEAD AND LINER EXCHANGE;  Surgeon: Rod Can, MD;  Location: [facility];  Service: Orthopedics;  Laterality: Left;   COLONOSCOPY  2015   DIAGNOSTIC LAPAROSCOPY     tubal preg-took ovary and tube   LAPAROSCOPY FOR ECTOPIC PREGNANCY  1990s   LEFT SALPINGECTOMY AND RIGHT OOPHORECTOMY FOR ABNORMALITY   MASS EXCISION  02/15/2012   Procedure: EXCISION MASS;  Surgeon: Schuyler Amor, MD;  Location: [facility];  Service: Orthopedics;  Laterality: Right;  Excision of Right Small Volar Mass   MYELOGRAM     TONSILLECTOMY  age 56   TOTAL HIP ARTHROPLASTY Left 04/25/2018   Procedure: TOTAL HIP ARTHROPLASTY ANTERIOR APPROACH;  Surgeon: Rod Can, MD;  Location: [facility];  Service: Orthopedics;  Laterality: Left;   TRIGGER FINGER RELEASE Right 2018   thumb   WISDOM TOOTH EXTRACTION  age 56    There were no vitals filed for this visit.   Subjective Assessment - 03/15/21  0851     Subjective Pt. indicated getting up was 7/10 with pain better upon arrival c back brace wearing.    Pertinent History Multiple lumbar DDD, mild lumbar spinal stenosis, left hip flexor weakness post left hip anterior THA 2020.   - seen Oct 2022 at [address] for Rt ankle    Limitations Standing;Walking;House hold activities;Lifting;Sitting    Patient Stated Goals Reduce pain    Currently in Pain? No/denies    Pain Score 7    at worst   Pain Location Back    Pain Orientation Lower    Pain Descriptors / Indicators Aching;Tightness    Pain Onset More than a month ago    Pain Frequency Constant    Aggravating Factors  bending, stooping, walking    Pain Relieving Factors back brace helped                [facility] PT Assessment - 03/15/21 0001       Assessment   Medical Diagnosis M48.062 (ICD-10-CM) - Spinal stenosis of lumbar region with neurogenic claudication  M47.816 (ICD-10-CM) - Lumbar spondylosis  M51.36 (ICD-10-CM) - Degeneration of lumbar intervertebral disc  Z96.642 (ICD-10-CM) - History of total left hip replacement    Referring Provider (PT) Jessy Oto, MD    Onset Date/Surgical  Date 02/28/18    Hand Dominance Right      Strength   Right Hip ABduction 4/5    Left Hip ABduction 4/5                           [facility] Adult PT Treatment/Exercise - 03/15/21 0001       Lumbar Exercises: Stretches   Single Knee to Chest Stretch 30 seconds;3 reps;Left;Right   c strap around leg   Lower Trunk Rotation 3 reps   15 seconds x 3 bilateral     Lumbar Exercises: Aerobic   Nustep Lvl 5 10 mins UE/LE      Lumbar Exercises: Seated   Sit to Stand 20 reps   20 inch table height, no UE assist     Lumbar Exercises: Supine   Bridge 20 reps   2-3 second hold   Other Supine Lumbar Exercises supine marching c TrA contraction x 10      Lumbar Exercises: Sidelying   Clam Both;Other (comment)   2 x 10                      PT Short Term Goals -  03/15/21 0916       PT SHORT TERM GOAL #1   Title Patient will demonstrate independent use of home exercise program to maintain progress from in clinic treatments.    Time 3    Period Weeks    Status On-going    Target Date 04/02/21               PT Long Term Goals - 03/12/21 0932       PT LONG TERM GOAL #1   Title Patient will demonstrate/report pain at worst less than or equal to 2/10 to facilitate minimal limitation in daily activity secondary to pain symptoms.    Time 10    Period Weeks    Status New    Target Date 05/21/21      PT LONG TERM GOAL #2   Title Patient will demonstrate independent use of home exercise program to facilitate ability to maintain/progress functional gains from skilled physical therapy services.    Time 10    Period Weeks    Status New    Target Date 05/21/21      PT LONG TERM GOAL #3   Title Pt. will demonstrate FOTO outcome > or = 49% to indicated reduced disability due to condition.    Time 10    Period Weeks    Status New    Target Date 05/21/21      PT LONG TERM GOAL #4   Title Pt. will demonstrate lumbar extension > or = 75% to facilitate upright standing/walking posture for home and work activity.    Time 10    Period Weeks    Status New    Target Date 05/21/21      PT LONG TERM GOAL #5   Title Pt. will demonstrate Lt hip flexion MMT > or = 4/5 to facilitate transfers, standing, walking at home/work.    Time 10    Period Weeks    Status New    Target Date 05/21/21      Additional Long Term Goals   Additional Long Term Goals Yes      PT LONG TERM GOAL #6   Title Pt. will demonstrate/report ability to walk/standing > 1 hr for work related tasks.  Time 10    Period Weeks    Status New    Target Date 05/21/21      PT LONG TERM GOAL #7   Title Pt. will demonstrate bilateral SLS > 10 seconds to facilitate stability in ambulation.    Time 10    Period Weeks    Status New    Target Date 05/21/21                    Plan - 03/15/21 0909     Clinical Impression Statement Good overall knowledge of existing HEP c some cues required to ensure proper techniques.  Pt. demonstrated improved quality in Lt hip supine active march movement showing improved hip flexor use but difficulty still high.  Overall strengthening program continued to benefit ability to perform movement.    Personal Factors and Comorbidities Other   Multiple lumbar DDD, mild lumbar spinal stenosis, left hip flexor weakness post left hip anterior THA   Examination-Activity Limitations Sit;Sleep;Bed Mobility;Squat;Bend;Stairs;Carry;Stand;Transfers;Dressing;Hygiene/Grooming;Lift;Locomotion Level;Reach Overhead    Examination-Participation Restrictions Occupation;Meal Prep;Laundry;Interpersonal Relationship;Driving;Community Activity;Cleaning    Stability/Clinical Decision Making Evolving/Moderate complexity    Rehab Potential --   fair to good   PT Frequency 2x / week    PT Duration Other (comment)   10 weeks   PT Treatment/Interventions ADLs/Self Care Home Management;Cryotherapy;Electrical Stimulation;Iontophoresis 4mg /ml Dexamethasone;Moist Heat;Balance training;Traction;Therapeutic exercise;Therapeutic activities;Functional mobility training;Stair training;Gait training;Ultrasound;Neuromuscular re-education;Patient/family education;Passive range of motion;Spinal Manipulations;Joint Manipulations;Dry needling;Taping;Manual techniques    PT Next Visit Plan Continue progress lumbar/core, hip strengthening.    PT Home Exercise Plan [email or web address]    Consulted and Agree with Plan of Care Patient             Patient will benefit from skilled therapeutic intervention in order to improve the following deficits and impairments:  Abnormal gait, Decreased endurance, Hypomobility, Decreased activity tolerance, Decreased strength, Pain, Difficulty walking, Decreased mobility, Decreased balance, Decreased range of motion, Impaired perceived  functional ability, Improper body mechanics, Postural dysfunction, Impaired flexibility, Decreased coordination  Visit Diagnosis: Chronic bilateral low back pain without sciatica  Muscle weakness (generalized)  Pain in left hip  Abnormal posture  Difficulty in walking, not elsewhere classified     Problem List Patient Active Problem List   Diagnosis Date Noted   Body mass index (BMI) 37.0-37.9, adult 03/14/2019   Lumbar stenosis without neurogenic claudication 03/14/2019   Hyperlipidemia 02/08/2019   Lumbar spondylosis 10/25/2018   Degeneration of lumbar intervertebral disc 10/01/2018   Surgical wound dehiscence 06/06/2018   Osteoarthritis of left hip 04/25/2018   Spinal stenosis of lumbar region 02/23/2018   Pain in left knee 01/10/2018   Trigger thumb of right hand 03/30/2017   Dyspnea 03/26/2013   Asthma vs VCD  02/28/2013   Hypertensive disorder 06/07/2012   Pyogenic granuloma 02/15/2012    Scot Jun, PT, DPT, OCS, ATC 03/15/21  9:19 AM    [facility] Physical Therapy [address] [place], [place], [address] Phone: [phone]   Fax:  [phone]  Name: Darlene Crosby MRN: [id number] Date of Birth: [birth year]

## 2021-03-18 ENCOUNTER — Ambulatory Visit: Payer: 59 | Admitting: Rehabilitative and Restorative Service Providers"

## 2021-03-18 ENCOUNTER — Other Ambulatory Visit: Payer: Self-pay

## 2021-03-18 ENCOUNTER — Encounter: Payer: Self-pay | Admitting: Rehabilitative and Restorative Service Providers"

## 2021-03-18 DIAGNOSIS — G8929 Other chronic pain: Secondary | ICD-10-CM

## 2021-03-18 DIAGNOSIS — M545 Low back pain, unspecified: Secondary | ICD-10-CM | POA: Diagnosis not present

## 2021-03-18 DIAGNOSIS — R293 Abnormal posture: Secondary | ICD-10-CM | POA: Diagnosis not present

## 2021-03-18 DIAGNOSIS — M6281 Muscle weakness (generalized): Secondary | ICD-10-CM | POA: Diagnosis not present

## 2021-03-18 DIAGNOSIS — M25552 Pain in left hip: Secondary | ICD-10-CM | POA: Diagnosis not present

## 2021-03-18 DIAGNOSIS — R262 Difficulty in walking, not elsewhere classified: Secondary | ICD-10-CM

## 2021-03-18 NOTE — Therapy (Signed)
[facility] Physical Therapy [address] [place], [place], [address] Phone: [phone]   Fax:  [phone]  Physical Therapy Treatment  Patient Details  Name: Darlene Crosby MRN: [id number] Date of Birth: 09-18-1965 Referring Provider (PT): Jessy Oto, MD   Encounter Date: 03/18/2021   PT End of Session - 03/18/21 1517     Visit Number 3    Number of Visits 20    Date for PT Re-Evaluation 05/21/21    Progress Note Due on Visit 10    PT Start Time 2202    PT Stop Time 5427    PT Time Calculation (min) 48 min    Activity Tolerance Patient tolerated treatment well;No increased pain    Behavior During Therapy WFL for tasks assessed/performed             Past Medical History:  Diagnosis Date   Asthma    followed by pcp   Hypertension    OA (osteoarthritis)    left knee, right shoulder, left hip   Seasonal allergies    Wears glasses     Past Surgical History:  Procedure Laterality Date   ANTERIOR HIP REVISION Left 06/06/2018   Procedure: LEFT HIP WOUND DEHISCENCE POSSIBLE HEAD AND LINER EXCHANGE;  Surgeon: Rod Can, MD;  Location: [facility];  Service: Orthopedics;  Laterality: Left;   COLONOSCOPY  2015   DIAGNOSTIC LAPAROSCOPY     tubal preg-took ovary and tube   LAPAROSCOPY FOR ECTOPIC PREGNANCY  1990s   LEFT SALPINGECTOMY AND RIGHT OOPHORECTOMY FOR ABNORMALITY   MASS EXCISION  02/15/2012   Procedure: EXCISION MASS;  Surgeon: Schuyler Amor, MD;  Location: [facility];  Service: Orthopedics;  Laterality: Right;  Excision of Right Small Volar Mass   MYELOGRAM     TONSILLECTOMY  age 56   TOTAL HIP ARTHROPLASTY Left 04/25/2018   Procedure: TOTAL HIP ARTHROPLASTY ANTERIOR APPROACH;  Surgeon: Rod Can, MD;  Location: [facility];  Service: Orthopedics;  Laterality: Left;   TRIGGER FINGER RELEASE Right 2018   thumb   WISDOM TOOTH EXTRACTION  age 56    There were no vitals filed for this visit.   Subjective  Assessment - 03/18/21 1514     Subjective Darlene Crosby reports good HEP compliance.  She has been particularly good about getting her exercises in at work.    Pertinent History Multiple lumbar DDD, mild lumbar spinal stenosis, left hip flexor weakness post left hip anterior THA 2020.   - seen Oct 2022 [address] for Rt ankle    Limitations Standing;Walking;House hold activities;Lifting;Sitting    How long can you sit comfortably? 1 hour    How long can you stand comfortably? 20-25 minutes    How long can you walk comfortably? 20-25 minutes    Patient Stated Goals Reduce pain    Currently in Pain? Yes    Pain Score 5     Pain Location Back    Pain Orientation Lower    Pain Descriptors / Indicators Aching;Tightness;Sore    Pain Type Chronic pain    Pain Radiating Towards NA    Pain Onset More than a month ago    Pain Frequency Constant    Aggravating Factors  Slouched and flexed postures, prolonged sitting    Pain Relieving Factors Exercise and change of position    Effect of Pain on Daily Activities Limited endurance and function with job and house ADLs  [facility] Adult PT Treatment/Exercise - 03/18/21 0001       Posture/Postural Control   Posture/Postural Control Postural limitations    Postural Limitations Forward head;Rounded Shoulders;Decreased lumbar lordosis      Therapeutic Activites    Therapeutic Activities ADL's    ADL's Discussed taking short, frequent breaks to change position (IE not sit), discussed home walking program, reviewed bed mobility (log roll) and HEP      Exercises   Exercises Lumbar      Lumbar Exercises: Stretches   Single Knee to Chest Stretch Left;Right;4 reps;30 seconds   c strap around leg   Lower Trunk Rotation 4 reps   15 seconds x 4 bilateral     Lumbar Exercises: Standing   Other Standing Lumbar Exercises Hip hike in doorway (shoulder on doorframe) 2 sets of 5 for 3 seconds B    Other Standing  Lumbar Exercises Trunk extension AROM (hips forward) 10X 3 seconds and shoulder blade pinches 10X 5 seconds      Lumbar Exercises: Seated   Sit to Stand 10 reps;Limitations    Sit to Stand Limitations 2 sets slow eccentrics      Lumbar Exercises: Supine   Bridge Non-compliant;10 reps;5 seconds;Limitations    Bridge Limitations 2 sets    Other Supine Lumbar Exercises Supine marching with TA activation 10X 5 seconds B      Lumbar Exercises: Sidelying   Clam Both;Other (comment)   2 x 10                    PT Education - 03/18/21 1516     Education Details Reviewed spine anatomy, posture and body mechanics.  Reviewed and progressed HEP.    Person(s) Educated Patient    Methods Explanation;Demonstration;Verbal cues;Handout    Comprehension Verbalized understanding;Need further instruction;Returned demonstration;Verbal cues required              PT Short Term Goals - 03/15/21 0916       PT SHORT TERM GOAL #1   Title Patient will demonstrate independent use of home exercise program to maintain progress from in clinic treatments.    Time 3    Period Weeks    Status On-going    Target Date 04/02/21               PT Long Term Goals - 03/12/21 0932       PT LONG TERM GOAL #1   Title Patient will demonstrate/report pain at worst less than or equal to 2/10 to facilitate minimal limitation in daily activity secondary to pain symptoms.    Time 10    Period Weeks    Status New    Target Date 05/21/21      PT LONG TERM GOAL #2   Title Patient will demonstrate independent use of home exercise program to facilitate ability to maintain/progress functional gains from skilled physical therapy services.    Time 10    Period Weeks    Status New    Target Date 05/21/21      PT LONG TERM GOAL #3   Title Pt. will demonstrate FOTO outcome > or = 49% to indicated reduced disability due to condition.    Time 10    Period Weeks    Status New    Target Date 05/21/21       PT LONG TERM GOAL #4   Title Pt. will demonstrate lumbar extension > or = 75% to facilitate upright standing/walking posture for home  and work activity.    Time 10    Period Weeks    Status New    Target Date 05/21/21      PT LONG TERM GOAL #5   Title Pt. will demonstrate Lt hip flexion MMT > or = 4/5 to facilitate transfers, standing, walking at home/work.    Time 10    Period Weeks    Status New    Target Date 05/21/21      Additional Long Term Goals   Additional Long Term Goals Yes      PT LONG TERM GOAL #6   Title Pt. will demonstrate/report ability to walk/standing > 1 hr for work related tasks.    Time 10    Period Weeks    Status New    Target Date 05/21/21      PT LONG TERM GOAL #7   Title Pt. will demonstrate bilateral SLS > 10 seconds to facilitate stability in ambulation.    Time 10    Period Weeks    Status New    Target Date 05/21/21                   Plan - 03/18/21 1518     Clinical Impression Statement Darlene Crosby is doing a great job with her home exercises.  She demonstrated good mechanics with bed mobility and is aware of the importance of taking short and frequent breaks to stand and stretch throughout the day (avoid prolonged sitting).  We progressed some hip and core strength work and she may benefit from adding this to her HEP if she looks good with it next week.  Continue core and hip strength work to meet all LTGs.    Personal Factors and Comorbidities Other    Examination-Activity Limitations Sit;Sleep;Bed Mobility;Squat;Bend;Stairs;Carry;Stand;Transfers;Dressing;Hygiene/Grooming;Lift;Locomotion Level;Reach Overhead    Examination-Participation Restrictions Occupation;Meal Prep;Laundry;Interpersonal Relationship;Driving;Community Activity;Cleaning    Stability/Clinical Decision Making Evolving/Moderate complexity    Rehab Potential --   fair to good   PT Frequency 2x / week    PT Duration Other (comment)   10 weeks   PT  Treatment/Interventions ADLs/Self Care Home Management;Cryotherapy;Electrical Stimulation;Iontophoresis 4mg /ml Dexamethasone;Moist Heat;Balance training;Traction;Therapeutic exercise;Therapeutic activities;Functional mobility training;Stair training;Gait training;Ultrasound;Neuromuscular re-education;Patient/family education;Passive range of motion;Spinal Manipulations;Joint Manipulations;Dry needling;Taping;Manual techniques    PT Next Visit Plan Progress back and hip strength    PT Home Exercise Plan [email or web address]    Consulted and Agree with Plan of Care Patient             Patient will benefit from skilled therapeutic intervention in order to improve the following deficits and impairments:  Abnormal gait, Decreased endurance, Hypomobility, Decreased activity tolerance, Decreased strength, Pain, Difficulty walking, Decreased mobility, Decreased balance, Decreased range of motion, Impaired perceived functional ability, Improper body mechanics, Postural dysfunction, Impaired flexibility, Decreased coordination  Visit Diagnosis: Chronic bilateral low back pain without sciatica  Muscle weakness (generalized)  Pain in left hip  Abnormal posture  Difficulty in walking, not elsewhere classified     Problem List Patient Active Problem List   Diagnosis Date Noted   Body mass index (BMI) 37.0-37.9, adult 03/14/2019   Lumbar stenosis without neurogenic claudication 03/14/2019   Hyperlipidemia 02/08/2019   Lumbar spondylosis 10/25/2018   Degeneration of lumbar intervertebral disc 10/01/2018   Surgical wound dehiscence 06/06/2018   Osteoarthritis of left hip 04/25/2018   Spinal stenosis of lumbar region 02/23/2018   Pain in left knee 01/10/2018   Trigger thumb of right hand 03/30/2017   Dyspnea 03/26/2013   Asthma  vs VCD  02/28/2013   Hypertensive disorder 06/07/2012   Pyogenic granuloma 02/15/2012    Farley Ly, PT, MPT 03/18/2021, 3:21 PM  [facility] Physical  Therapy [address] [place], [place], [address] Phone: [phone]   Fax:  [phone]  Name: Darlene Crosby MRN: [id number] Date of Birth: 05-20-1965

## 2021-03-18 NOTE — Patient Instructions (Signed)
Access Code: [email or web address] URL: [email or web address] Date: 03/18/2021 Prepared by: Vista Mink  Exercises Supine Lower Trunk Rotation - 2 x daily - 7 x weekly - 1 sets - 5 reps - 15 hold Supine Single Knee to Chest Stretch - 2 x daily - 7 x weekly - 1 sets - 4-5 reps - 30 hold Supine March - 2 x daily - 7 x weekly - 3 sets - 10 reps Supine Bridge - 2 x daily - 7 x weekly - 3 sets - 10 reps - 5 hold Eccentric Squat - 1-2 x daily - 7 x weekly - 3 sets - 10 reps Clamshell - 1-2 x daily - 7 x weekly - 3 sets - 10 reps Standing Lumbar Extension at Wall - Forearms - 5 x daily - 7 x weekly - 1 sets - 5 reps - 3 seconds hold Standing Scapular Retraction - 5 x daily - 7 x weekly - 1 sets - 5 reps - 5 second hold

## 2021-03-24 ENCOUNTER — Other Ambulatory Visit: Payer: Self-pay

## 2021-03-24 ENCOUNTER — Encounter: Payer: Self-pay | Admitting: Rehabilitative and Restorative Service Providers"

## 2021-03-24 ENCOUNTER — Ambulatory Visit: Payer: 59 | Admitting: Rehabilitative and Restorative Service Providers"

## 2021-03-24 DIAGNOSIS — R293 Abnormal posture: Secondary | ICD-10-CM | POA: Diagnosis not present

## 2021-03-24 DIAGNOSIS — M25552 Pain in left hip: Secondary | ICD-10-CM | POA: Diagnosis not present

## 2021-03-24 DIAGNOSIS — M6281 Muscle weakness (generalized): Secondary | ICD-10-CM

## 2021-03-24 DIAGNOSIS — R262 Difficulty in walking, not elsewhere classified: Secondary | ICD-10-CM

## 2021-03-24 DIAGNOSIS — M545 Low back pain, unspecified: Secondary | ICD-10-CM | POA: Diagnosis not present

## 2021-03-24 DIAGNOSIS — G8929 Other chronic pain: Secondary | ICD-10-CM

## 2021-03-24 NOTE — Procedures (Signed)
Lumbar Diagnostic Facet Joint Nerve Block with Fluoroscopic Guidance   Patient: Darlene Crosby      Date of Birth: 08/10/1965 MRN: [id number] PCP: Camillia Herter, NP      Visit Date: 02/17/2021   Universal Protocol:    Date/Time: 01/25/239:11 AM  Consent Given By: the patient  Position: PRONE  Additional Comments: Vital signs were monitored before and after the procedure. Patient was prepped and draped in the usual sterile fashion. The correct patient, procedure, and site was verified.   Injection Procedure Details:   Procedure diagnoses:  1. Spondylosis without myelopathy or radiculopathy, lumbar region      Meds Administered:  Meds ordered this encounter  Medications   methylPREDNISolone acetate (DEPO-MEDROL) injection 80 mg     Laterality: Bilateral  Location/Site: L2-L3, L1 and L2 medial branches, L3-L4, L2 and L3 medial branches, and L4-L5, L3 and L4 medial branches  Needle: 5.0 in., 25 ga.  Short bevel or Quincke spinal needle  Needle Placement: Oblique pedical  Findings:   -Comments: There was excellent flow of contrast along the articular pillars without intravascular flow.  Procedure Details: The fluoroscope beam is vertically oriented in AP and then obliqued 15 to 20 degrees to the ipsilateral side of the desired nerve to achieve the Scotty dog appearance.  The skin over the target area of the junction of the superior articulating process and the transverse process (sacral ala if blocking the L5 dorsal rami) was locally anesthetized with a 1 ml volume of 1% Lidocaine without Epinephrine.  The spinal needle was inserted and advanced in a trajectory view down to the target.   After contact with periosteum and negative aspirate for blood and CSF, correct placement without intravascular or epidural spread was confirmed by injecting 0.5 ml. of Isovue-250.  A spot radiograph was obtained of this image.    Next, a 0.5 ml. volume of the injectate described above  was injected. The needle was then redirected to the other facet joint nerves mentioned above if needed.  Prior to the procedure, the patient was given a Pain Diary which was completed for baseline measurements.  After the procedure, the patient rated their pain every 30 minutes and will continue rating at this frequency for a total of 5 hours.  The patient has been asked to complete the Diary and return to [place] by mail, fax or hand delivered as soon as possible.   Additional Comments:  The patient tolerated the procedure well Dressing: 2 x 2 sterile gauze and Band-Aid    Post-procedure details: Patient was observed during the procedure. Post-procedure instructions were reviewed.  Patient left the clinic in stable condition.

## 2021-03-24 NOTE — Progress Notes (Signed)
Darlene Crosby - 56 y.o. female MRN [id number]  Date of birth: Feb 15, 1966  Office Visit Note: Visit Date: 02/17/2021 PCP: Camillia Herter, NP Referred by: Camillia Herter, NP  Subjective: Chief Complaint  Patient presents with   Lower Back - Pain   Left Leg - Pain   Right Leg - Pain   HPI:  Darlene Crosby is a 56 y.o. female who comes in today at the request of Dr. [name] for planned Bilateral  L2-3, L3-4, and L4-5 Lumbar facet/medial branch block with fluoroscopic guidance.  The patient has failed conservative care including home exercise, medications, time and activity modification.  This injection will be diagnostic and hopefully therapeutic.  Please see requesting physician notes for further details and justification.  Exam has shown concordant pain with facet joint loading.  This would be the first diagnostic injection for a set of 2 diagnostic injections required for potential for radiofrequency ablation approval.  She was somewhat confused about the issue of needing to injections and I did explain that today.  ROS Otherwise per HPI.  Assessment & Plan: Visit Diagnoses:    ICD-10-CM   1. Spondylosis without myelopathy or radiculopathy, lumbar region  M47.816 XR C-ARM NO REPORT    Facet Injection    DISCONTINUED: methylPREDNISolone acetate (DEPO-MEDROL) injection 80 mg      Plan: No additional findings.   Meds & Orders:  Meds ordered this encounter  Medications   DISCONTD: methylPREDNISolone acetate (DEPO-MEDROL) injection 80 mg    Orders Placed This Encounter  Procedures   Facet Injection   XR C-ARM NO REPORT    Follow-up: Return for Review Pain Diary.   Procedures: No procedures performed  Lumbar Diagnostic Facet Joint Nerve Block with Fluoroscopic Guidance   Patient: Darlene Crosby      Date of Birth: 1965-07-25 MRN: [id number] PCP: Camillia Herter, NP      Visit Date: 02/17/2021   Universal Protocol:    Date/Time: 01/25/239:11 AM  Consent Given  By: the patient  Position: PRONE  Additional Comments: Vital signs were monitored before and after the procedure. Patient was prepped and draped in the usual sterile fashion. The correct patient, procedure, and site was verified.   Injection Procedure Details:   Procedure diagnoses:  1. Spondylosis without myelopathy or radiculopathy, lumbar region      Meds Administered:  Meds ordered this encounter  Medications   methylPREDNISolone acetate (DEPO-MEDROL) injection 80 mg     Laterality: Bilateral  Location/Site: L2-L3, L1 and L2 medial branches, L3-L4, L2 and L3 medial branches, and L4-L5, L3 and L4 medial branches  Needle: 5.0 in., 25 ga.  Short bevel or Quincke spinal needle  Needle Placement: Oblique pedical  Findings:   -Comments: There was excellent flow of contrast along the articular pillars without intravascular flow.  Procedure Details: The fluoroscope beam is vertically oriented in AP and then obliqued 15 to 20 degrees to the ipsilateral side of the desired nerve to achieve the Scotty dog appearance.  The skin over the target area of the junction of the superior articulating process and the transverse process (sacral ala if blocking the L5 dorsal rami) was locally anesthetized with a 1 ml volume of 1% Lidocaine without Epinephrine.  The spinal needle was inserted and advanced in a trajectory view down to the target.   After contact with periosteum and negative aspirate for blood and CSF, correct placement without intravascular or epidural spread was confirmed by injecting 0.5 ml. of  Isovue-250.  A spot radiograph was obtained of this image.    Next, a 0.5 ml. volume of the injectate described above was injected. The needle was then redirected to the other facet joint nerves mentioned above if needed.  Prior to the procedure, the patient was given a Pain Diary which was completed for baseline measurements.  After the procedure, the patient rated their pain every 30  minutes and will continue rating at this frequency for a total of 5 hours.  The patient has been asked to complete the Diary and return to [place] by mail, fax or hand delivered as soon as possible.   Additional Comments:  The patient tolerated the procedure well Dressing: 2 x 2 sterile gauze and Band-Aid    Post-procedure details: Patient was observed during the procedure. Post-procedure instructions were reviewed.  Patient left the clinic in stable condition.    Clinical History: No specialty comments available.     Objective:  VS:  HT:     WT:    BMI:      BP:(!) 159/94   HR:87bpm   TEMP: ( )   RESP:  Physical Exam Vitals and nursing note reviewed.  Constitutional:      General: She is not in acute distress.    Appearance: Normal appearance. She is not ill-appearing.  HENT:     Head: Normocephalic and atraumatic.     Right Ear: External ear normal.     Left Ear: External ear normal.  Eyes:     Extraocular Movements: Extraocular movements intact.  Cardiovascular:     Rate and Rhythm: Normal rate.     Pulses: Normal pulses.  Pulmonary:     Effort: Pulmonary effort is normal. No respiratory distress.  Abdominal:     General: There is no distension.     Palpations: Abdomen is soft.  Musculoskeletal:        General: Tenderness present.     Cervical back: Neck supple.     Right lower leg: No edema.     Left lower leg: No edema.     Comments: Patient has good distal strength with no pain over the greater trochanters.  No clonus or focal weakness. Patient somewhat slow to rise from a seated position to full extension.  There is concordant low back pain with facet loading and lumbar spine extension rotation.  There are no definitive trigger points but the patient is somewhat tender across the lower back and PSIS.  There is no pain with hip rotation.   Skin:    Findings: No erythema, lesion or rash.  Neurological:     General: No focal deficit present.     Mental Status: She is  alert and oriented to person, place, and time.     Sensory: No sensory deficit.     Motor: No weakness or abnormal muscle tone.     Coordination: Coordination normal.  Psychiatric:        Mood and Affect: Mood normal.        Behavior: Behavior normal.     Imaging: No results found.

## 2021-03-24 NOTE — Therapy (Signed)
[facility] [address] [place], [place], [address] Phone: [phone]   Fax:  [phone]  Physical Therapy Treatment  Patient Details  Name: Darlene Crosby MRN: [id number] Date of Birth: 1965-07-16 Referring Provider (PT): Jessy Oto, MD   Encounter Date: 03/24/2021   PT End of Session - 03/24/21 1346     Visit Number 4    Number of Visits 20    Date for PT Re-Evaluation 05/21/21    Progress Note Due on Visit 10    PT Start Time 1342    PT Stop Time 1421    PT Time Calculation (min) 39 min    Activity Tolerance Patient tolerated treatment well    Behavior During Therapy [facility] for tasks assessed/performed             Past Medical History:  Diagnosis Date   Asthma    followed by pcp   Hypertension    OA (osteoarthritis)    left knee, right shoulder, left hip   Seasonal allergies    Wears glasses     Past Surgical History:  Procedure Laterality Date   ANTERIOR HIP REVISION Left 06/06/2018   Procedure: LEFT HIP WOUND DEHISCENCE POSSIBLE HEAD AND LINER EXCHANGE;  Surgeon: Rod Can, MD;  Location: [facility];  Service: Orthopedics;  Laterality: Left;   COLONOSCOPY  2015   DIAGNOSTIC LAPAROSCOPY     tubal preg-took ovary and tube   LAPAROSCOPY FOR ECTOPIC PREGNANCY  1990s   LEFT SALPINGECTOMY AND RIGHT OOPHORECTOMY FOR ABNORMALITY   MASS EXCISION  02/15/2012   Procedure: EXCISION MASS;  Surgeon: Schuyler Amor, MD;  Location: [place];  Service: Orthopedics;  Laterality: Right;  Excision of Right Small Volar Mass   MYELOGRAM     TONSILLECTOMY  age 34   TOTAL HIP ARTHROPLASTY Left 04/25/2018   Procedure: TOTAL HIP ARTHROPLASTY ANTERIOR APPROACH;  Surgeon: Rod Can, MD;  Location: [facility];  Service: Orthopedics;  Laterality: Left;   TRIGGER FINGER RELEASE Right 2018   thumb   WISDOM TOOTH EXTRACTION  age 51    There were no vitals filed for this visit.   Subjective Assessment - 03/24/21  1345     Subjective Pt. indicated nothing worse for pain.  Rated about 7/10 at most today.  Pt. indicated no specific aggravating or easing factors today.    Pertinent History Multiple lumbar DDD, mild lumbar spinal stenosis, left hip flexor weakness post left hip anterior THA 2020.   - seen Oct 2022 at [address] for Rt ankle    Limitations Standing;Walking;House hold activities;Lifting;Sitting    Patient Stated Goals Reduce pain    Currently in Pain? Yes    Pain Score 7    at worst   Pain Location Back    Pain Orientation Lower    Pain Descriptors / Indicators Aching;Tightness;Sore    Pain Type Chronic pain    Pain Onset More than a month ago    Pain Frequency Constant    Aggravating Factors  nothing specifc reported    Pain Relieving Factors nothing specific reported.                [facility] PT Assessment - 03/24/21 0001       Assessment   Medical Diagnosis M48.062 (ICD-10-CM) - Spinal stenosis of lumbar region with neurogenic claudication  M47.816 (ICD-10-CM) - Lumbar spondylosis  M51.36 (ICD-10-CM) - Degeneration of lumbar intervertebral disc  Z96.642 (ICD-10-CM) - History of total left hip replacement  Referring Provider (PT) Jessy Oto, MD    Onset Date/Surgical Date 02/28/18    Hand Dominance Right      AROM   Lumbar Extension 75% [facility]                           [facility] Adult PT Treatment/Exercise - 03/24/21 0001       Lumbar Exercises: Stretches   Lower Trunk Rotation Other (comment)   15 sec x 5 bilateral     Lumbar Exercises: Aerobic   Nustep Lvl 6 10 mins UE/LE      Lumbar Exercises: Standing   Row Theraband;Both   3 x 10 c scapular retraction c TrA contraction focus   Theraband Level (Row) Level 4 (Blue)    Shoulder Extension Both;Theraband   3 x 10 c scapular retraction c TrA contraction focus   Theraband Level (Shoulder Extension) Level 4 (Blue)    Other Standing Lumbar Exercises Hip hike in doorway 3 sec hold x 15 bilateral       Lumbar Exercises: Supine   Bridge Other (comment)   2 x 10                      PT Short Term Goals - 03/15/21 0916       PT SHORT TERM GOAL #1   Title Patient will demonstrate independent use of home exercise program to maintain progress from in clinic treatments.    Time 3    Period Weeks    Status On-going    Target Date 04/02/21               PT Long Term Goals - 03/12/21 0932       PT LONG TERM GOAL #1   Title Patient will demonstrate/report pain at worst less than or equal to 2/10 to facilitate minimal limitation in daily activity secondary to pain symptoms.    Time 10    Period Weeks    Status New    Target Date 05/21/21      PT LONG TERM GOAL #2   Title Patient will demonstrate independent use of home exercise program to facilitate ability to maintain/progress functional gains from skilled physical therapy services.    Time 10    Period Weeks    Status New    Target Date 05/21/21      PT LONG TERM GOAL #3   Title Pt. will demonstrate FOTO outcome > or = 49% to indicated reduced disability due to condition.    Time 10    Period Weeks    Status New    Target Date 05/21/21      PT LONG TERM GOAL #4   Title Pt. will demonstrate lumbar extension > or = 75% to facilitate upright standing/walking posture for home and work activity.    Time 10    Period Weeks    Status New    Target Date 05/21/21      PT LONG TERM GOAL #5   Title Pt. will demonstrate Lt hip flexion MMT > or = 4/5 to facilitate transfers, standing, walking at home/work.    Time 10    Period Weeks    Status New    Target Date 05/21/21      Additional Long Term Goals   Additional Long Term Goals Yes      PT LONG TERM GOAL #6   Title Pt. will demonstrate/report ability to  walk/standing > 1 hr for work related tasks.    Time 10    Period Weeks    Status New    Target Date 05/21/21      PT LONG TERM GOAL #7   Title Pt. will demonstrate bilateral SLS > 10 seconds to  facilitate stability in ambulation.    Time 10    Period Weeks    Status New    Target Date 05/21/21                   Plan - 03/24/21 1420     Clinical Impression Statement Pt. has demonstrated improved lumbar mobility as noted in objective data.  Continued difficulty reported c sustained bending, housework activity but has reported some improvements.  Pt. indicated longer duration of sitting/standing as well.  Continued skilled PT services indicated to continue to improve.    Personal Factors and Comorbidities Other    Examination-Activity Limitations Sit;Sleep;Bed Mobility;Squat;Bend;Stairs;Carry;Stand;Transfers;Dressing;Hygiene/Grooming;Lift;Locomotion Level;Reach Overhead    Examination-Participation Restrictions Occupation;Meal Prep;Laundry;Interpersonal Relationship;Driving;Community Activity;Cleaning    Stability/Clinical Decision Making Evolving/Moderate complexity    Rehab Potential --   fair to good   PT Frequency 2x / week    PT Duration Other (comment)   10 weeks   PT Treatment/Interventions ADLs/Self Care Home Management;Cryotherapy;Electrical Stimulation;Iontophoresis 4mg /ml Dexamethasone;Moist Heat;Balance training;Traction;Therapeutic exercise;Therapeutic activities;Functional mobility training;Stair training;Gait training;Ultrasound;Neuromuscular re-education;Patient/family education;Passive range of motion;Spinal Manipulations;Joint Manipulations;Dry needling;Taping;Manual techniques    PT Next Visit Plan Progressive LE strengthening for functional mobility tasks (squat, kneel)    PT Home Exercise Plan [id number]    Consulted and Agree with Plan of Care Patient             Patient will benefit from skilled therapeutic intervention in order to improve the following deficits and impairments:  Abnormal gait, Decreased endurance, Hypomobility, Decreased activity tolerance, Decreased strength, Pain, Difficulty walking, Decreased mobility, Decreased balance, Decreased  range of motion, Impaired perceived functional ability, Improper body mechanics, Postural dysfunction, Impaired flexibility, Decreased coordination  Visit Diagnosis: Chronic bilateral low back pain without sciatica  Muscle weakness (generalized)  Pain in left hip  Abnormal posture  Difficulty in walking, not elsewhere classified     Problem List Patient Active Problem List   Diagnosis Date Noted   Body mass index (BMI) 37.0-37.9, adult 03/14/2019   Lumbar stenosis without neurogenic claudication 03/14/2019   Hyperlipidemia 02/08/2019   Lumbar spondylosis 10/25/2018   Degeneration of lumbar intervertebral disc 10/01/2018   Surgical wound dehiscence 06/06/2018   Osteoarthritis of left hip 04/25/2018   Spinal stenosis of lumbar region 02/23/2018   Pain in left knee 01/10/2018   Trigger thumb of right hand 03/30/2017   Dyspnea 03/26/2013   Asthma vs VCD  02/28/2013   Hypertensive disorder 06/07/2012   Pyogenic granuloma 02/15/2012   Scot Jun, PT, DPT, OCS, ATC 03/24/21  2:22 PM    [facility] [address] [place], [place], [address] Phone: [phone]   Fax:  [phone]  Name: Darlene Crosby MRN: [id number] Date of Birth: [birth year]

## 2021-03-29 ENCOUNTER — Ambulatory Visit: Payer: 59 | Admitting: Rehabilitative and Restorative Service Providers"

## 2021-03-29 ENCOUNTER — Other Ambulatory Visit: Payer: Self-pay

## 2021-03-29 DIAGNOSIS — M6281 Muscle weakness (generalized): Secondary | ICD-10-CM

## 2021-03-29 DIAGNOSIS — M545 Low back pain, unspecified: Secondary | ICD-10-CM

## 2021-03-29 DIAGNOSIS — R262 Difficulty in walking, not elsewhere classified: Secondary | ICD-10-CM

## 2021-03-29 DIAGNOSIS — G8929 Other chronic pain: Secondary | ICD-10-CM

## 2021-03-29 DIAGNOSIS — R293 Abnormal posture: Secondary | ICD-10-CM

## 2021-03-29 DIAGNOSIS — M25552 Pain in left hip: Secondary | ICD-10-CM

## 2021-03-29 NOTE — Therapy (Signed)
[facility] [address] [place], [place], [address] Phone: [phone]   Fax:  [phone]  Physical Therapy Treatment  Patient Details  Name: Darlene Crosby MRN: [id number] Date of Birth: Jun 20, 1965 Referring Provider (PT): Jessy Oto, MD   Encounter Date: 03/29/2021   PT End of Session - 03/29/21 0943     Visit Number 5    Number of Visits 20    Date for PT Re-Evaluation 05/21/21    Progress Note Due on Visit 10    PT Start Time 0937    PT Stop Time 1015    PT Time Calculation (min) 38 min    Activity Tolerance Patient tolerated treatment well    Behavior During Therapy [facility] for tasks assessed/performed             Past Medical History:  Diagnosis Date   Asthma    followed by pcp   Hypertension    OA (osteoarthritis)    left knee, right shoulder, left hip   Seasonal allergies    Wears glasses     Past Surgical History:  Procedure Laterality Date   ANTERIOR HIP REVISION Left 06/06/2018   Procedure: LEFT HIP WOUND DEHISCENCE POSSIBLE HEAD AND LINER EXCHANGE;  Surgeon: Rod Can, MD;  Location: [facility];  Service: Orthopedics;  Laterality: Left;   COLONOSCOPY  2015   DIAGNOSTIC LAPAROSCOPY     tubal preg-took ovary and tube   LAPAROSCOPY FOR ECTOPIC PREGNANCY  1990s   LEFT SALPINGECTOMY AND RIGHT OOPHORECTOMY FOR ABNORMALITY   MASS EXCISION  02/15/2012   Procedure: EXCISION MASS;  Surgeon: Schuyler Amor, MD;  Location: [place];  Service: Orthopedics;  Laterality: Right;  Excision of Right Small Volar Mass   MYELOGRAM     TONSILLECTOMY  age 45   TOTAL HIP ARTHROPLASTY Left 04/25/2018   Procedure: TOTAL HIP ARTHROPLASTY ANTERIOR APPROACH;  Surgeon: Rod Can, MD;  Location: [facility];  Service: Orthopedics;  Laterality: Left;   TRIGGER FINGER RELEASE Right 2018   thumb   WISDOM TOOTH EXTRACTION  age 59    There were no vitals filed for this visit.       [facility] PT Assessment -  03/29/21 0001       Assessment   Medical Diagnosis M48.062 (ICD-10-CM) - Spinal stenosis of lumbar region with neurogenic claudication  M47.816 (ICD-10-CM) - Lumbar spondylosis  M51.36 (ICD-10-CM) - Degeneration of lumbar intervertebral disc  Z96.642 (ICD-10-CM) - History of total left hip replacement    Referring Provider (PT) Jessy Oto, MD    Onset Date/Surgical Date 02/28/18    Hand Dominance Right      AROM   Lumbar Extension 100% [facility]                           [facility] Adult PT Treatment/Exercise - 03/29/21 0001       Lumbar Exercises: Stretches   Lower Trunk Rotation Other (comment);3 reps   15 sec x 3 bilateral     Lumbar Exercises: Machines for Strengthening   Leg Press Double leg 75 lbs x 15, single leg 31 lbs 2 x 10 bilateral      Lumbar Exercises: Standing   Other Standing Lumbar Exercises Hip hike in doorway 3 sec hold x 10 bilateral    Other Standing Lumbar Exercises standing lumbar extension 2 x 5      Lumbar Exercises: Seated   Sit to Stand 20 reps  Lumbar Exercises: Supine   Bridge Other (comment)   2 x 15   Other Supine Lumbar Exercises supine hip flexion isometric hold 10 sec x 6 bilateral c ipsilateral UE press down hold                       PT Short Term Goals - 03/29/21 0944       PT SHORT TERM GOAL #1   Title Patient will demonstrate independent use of home exercise program to maintain progress from in clinic treatments.    Time 3    Period Weeks    Status Achieved    Target Date 04/02/21               PT Long Term Goals - 03/12/21 0932       PT LONG TERM GOAL #1   Title Patient will demonstrate/report pain at worst less than or equal to 2/10 to facilitate minimal limitation in daily activity secondary to pain symptoms.    Time 10    Period Weeks    Status New    Target Date 05/21/21      PT LONG TERM GOAL #2   Title Patient will demonstrate independent use of home exercise program to facilitate  ability to maintain/progress functional gains from skilled physical therapy services.    Time 10    Period Weeks    Status New    Target Date 05/21/21      PT LONG TERM GOAL #3   Title Pt. will demonstrate FOTO outcome > or = 49% to indicated reduced disability due to condition.    Time 10    Period Weeks    Status New    Target Date 05/21/21      PT LONG TERM GOAL #4   Title Pt. will demonstrate lumbar extension > or = 75% to facilitate upright standing/walking posture for home and work activity.    Time 10    Period Weeks    Status New    Target Date 05/21/21      PT LONG TERM GOAL #5   Title Pt. will demonstrate Lt hip flexion MMT > or = 4/5 to facilitate transfers, standing, walking at home/work.    Time 10    Period Weeks    Status New    Target Date 05/21/21      Additional Long Term Goals   Additional Long Term Goals Yes      PT LONG TERM GOAL #6   Title Pt. will demonstrate/report ability to walk/standing > 1 hr for work related tasks.    Time 10    Period Weeks    Status New    Target Date 05/21/21      PT LONG TERM GOAL #7   Title Pt. will demonstrate bilateral SLS > 10 seconds to facilitate stability in ambulation.    Time 10    Period Weeks    Status New    Target Date 05/21/21                   Plan - 03/29/21 0957     Clinical Impression Statement Pt. to benefit from continued efforts in clinic and at home for Lt leg strengthening in particular to facilitate improved functional movement.    Personal Factors and Comorbidities Other    Examination-Activity Limitations Sit;Sleep;Bed Mobility;Squat;Bend;Stairs;Carry;Stand;Transfers;Dressing;Hygiene/Grooming;Lift;Locomotion Level;Reach Overhead    Examination-Participation Restrictions Occupation;Meal Prep;Laundry;Interpersonal Relationship;Driving;Community Activity;Cleaning    Stability/Clinical Decision Making Evolving/Moderate complexity  Rehab Potential --   fair to good   PT Frequency 2x  / week    PT Duration Other (comment)   10 weeks   PT Treatment/Interventions ADLs/Self Care Home Management;Cryotherapy;Electrical Stimulation;Iontophoresis 4mg /ml Dexamethasone;Moist Heat;Balance training;Traction;Therapeutic exercise;Therapeutic activities;Functional mobility training;Stair training;Gait training;Ultrasound;Neuromuscular re-education;Patient/family education;Passive range of motion;Spinal Manipulations;Joint Manipulations;Dry needling;Taping;Manual techniques    PT Next Visit Plan Progressive LE strengthening improvements, Lt specifically.  Review lumbar/hip stretching for home as needed    PT Home Exercise Plan [id number]    Consulted and Agree with Plan of Care Patient             Patient will benefit from skilled therapeutic intervention in order to improve the following deficits and impairments:  Abnormal gait, Decreased endurance, Hypomobility, Decreased activity tolerance, Decreased strength, Pain, Difficulty walking, Decreased mobility, Decreased balance, Decreased range of motion, Impaired perceived functional ability, Improper body mechanics, Postural dysfunction, Impaired flexibility, Decreased coordination  Visit Diagnosis: Chronic bilateral low back pain without sciatica  Muscle weakness (generalized)  Pain in left hip  Abnormal posture  Difficulty in walking, not elsewhere classified     Problem List Patient Active Problem List   Diagnosis Date Noted   Body mass index (BMI) 37.0-37.9, adult 03/14/2019   Lumbar stenosis without neurogenic claudication 03/14/2019   Hyperlipidemia 02/08/2019   Lumbar spondylosis 10/25/2018   Degeneration of lumbar intervertebral disc 10/01/2018   Surgical wound dehiscence 06/06/2018   Osteoarthritis of left hip 04/25/2018   Spinal stenosis of lumbar region 02/23/2018   Pain in left knee 01/10/2018   Trigger thumb of right hand 03/30/2017   Dyspnea 03/26/2013   Asthma vs VCD  02/28/2013   Hypertensive disorder  06/07/2012   Pyogenic granuloma 02/15/2012    Scot Jun, PT, DPT, OCS, ATC 03/29/21  10:10 AM    [facility] [address] [place], [place], [address] Phone: [phone]   Fax:  [phone]  Name: Darlene Crosby MRN: [id number] Date of Birth: 17-Apr-1965

## 2021-03-31 ENCOUNTER — Encounter: Payer: Self-pay | Admitting: Rehabilitative and Restorative Service Providers"

## 2021-03-31 ENCOUNTER — Ambulatory Visit: Payer: 59 | Admitting: Rehabilitative and Restorative Service Providers"

## 2021-03-31 ENCOUNTER — Other Ambulatory Visit: Payer: Self-pay

## 2021-03-31 DIAGNOSIS — M545 Low back pain, unspecified: Secondary | ICD-10-CM

## 2021-03-31 DIAGNOSIS — M25552 Pain in left hip: Secondary | ICD-10-CM | POA: Diagnosis not present

## 2021-03-31 DIAGNOSIS — M6281 Muscle weakness (generalized): Secondary | ICD-10-CM

## 2021-03-31 DIAGNOSIS — R293 Abnormal posture: Secondary | ICD-10-CM | POA: Diagnosis not present

## 2021-03-31 DIAGNOSIS — R262 Difficulty in walking, not elsewhere classified: Secondary | ICD-10-CM

## 2021-03-31 DIAGNOSIS — G8929 Other chronic pain: Secondary | ICD-10-CM

## 2021-03-31 NOTE — Therapy (Signed)
[facility] [address] [place], [place], [address] Phone: [phone]   Fax:  [phone]  Physical Therapy Treatment  Patient Details  Name: Darlene Crosby MRN: [id number] Date of Birth: 31-Mar-1965 Referring Provider (PT): Jessy Oto, MD   Encounter Date: 03/31/2021   PT End of Session - 03/31/21 1102     Visit Number 6    Number of Visits 20    Date for PT Re-Evaluation 05/21/21    Progress Note Due on Visit 10    PT Start Time 4403    PT Stop Time 1101    PT Time Calculation (min) 46 min    Activity Tolerance Patient tolerated treatment well    Behavior During Therapy [facility] for tasks assessed/performed             Past Medical History:  Diagnosis Date   Asthma    followed by pcp   Hypertension    OA (osteoarthritis)    left knee, right shoulder, left hip   Seasonal allergies    Wears glasses     Past Surgical History:  Procedure Laterality Date   ANTERIOR HIP REVISION Left 06/06/2018   Procedure: LEFT HIP WOUND DEHISCENCE POSSIBLE HEAD AND LINER EXCHANGE;  Surgeon: Rod Can, MD;  Location: [facility];  Service: Orthopedics;  Laterality: Left;   COLONOSCOPY  2015   DIAGNOSTIC LAPAROSCOPY     tubal preg-took ovary and tube   LAPAROSCOPY FOR ECTOPIC PREGNANCY  1990s   LEFT SALPINGECTOMY AND RIGHT OOPHORECTOMY FOR ABNORMALITY   MASS EXCISION  02/15/2012   Procedure: EXCISION MASS;  Surgeon: Schuyler Amor, MD;  Location: [place];  Service: Orthopedics;  Laterality: Right;  Excision of Right Small Volar Mass   MYELOGRAM     TONSILLECTOMY  age 56   TOTAL HIP ARTHROPLASTY Left 04/25/2018   Procedure: TOTAL HIP ARTHROPLASTY ANTERIOR APPROACH;  Surgeon: Rod Can, MD;  Location: [facility];  Service: Orthopedics;  Laterality: Left;   TRIGGER FINGER RELEASE Right 2018   thumb   WISDOM TOOTH EXTRACTION  age 56    There were no vitals filed for this visit.   Subjective Assessment - 03/31/21  1019     Subjective Pt. indicated she doesn't want to complain.  Rated pain 7/10 but "not too bad."    Pertinent History Multiple lumbar DDD, mild lumbar spinal stenosis, left hip flexor weakness post left hip anterior THA 2020.   - seen Oct 2022 at [address] for Rt ankle    Limitations Standing;Walking;House hold activities;Lifting;Sitting    Patient Stated Goals Reduce pain    Currently in Pain? Yes    Pain Score 7    rated not too bad   Pain Location Back    Pain Orientation Lower    Pain Descriptors / Indicators Tightness    Pain Type Chronic pain    Pain Onset More than a month ago    Pain Frequency Constant    Aggravating Factors  stiffness after baby watching    Pain Relieving Factors wearing brace some                [facility] PT Assessment - 03/31/21 0001       Observation/Other Assessments   Focus on Therapeutic Outcomes (FOTO)  update 46%                           [facility] Adult PT Treatment/Exercise - 03/31/21 0001  Lumbar Exercises: Stretches   Lower Trunk Rotation 3 reps   15 sec x 3 bilateral     Lumbar Exercises: Aerobic   Nustep Lvl 6 10 mins UE/LE      Lumbar Exercises: Machines for Strengthening   Leg Press Double leg 75 lbs 2 x 15, single leg 37 lbs 2 x 15 bilateral      Lumbar Exercises: Standing   Other Standing Lumbar Exercises 6 inch 2 x 10 bilateral fwd no hand assist    Other Standing Lumbar Exercises lumbar extension standing x 5      Lumbar Exercises: Supine   Bridge 5 seconds;15 reps    Other Supine Lumbar Exercises supine hip flexion isometric hold 10 sec x 6 bilateral c ipsilateral UE press down hold                       PT Short Term Goals - 03/29/21 0944       PT SHORT TERM GOAL #1   Title Patient will demonstrate independent use of home exercise program to maintain progress from in clinic treatments.    Time 3    Period Weeks    Status Achieved    Target Date 04/02/21               PT  Long Term Goals - 03/31/21 1104       PT LONG TERM GOAL #1   Title Patient will demonstrate/report pain at worst less than or equal to 2/10 to facilitate minimal limitation in daily activity secondary to pain symptoms.    Time 10    Period Weeks    Status On-going    Target Date 05/21/21      PT LONG TERM GOAL #2   Title Patient will demonstrate independent use of home exercise program to facilitate ability to maintain/progress functional gains from skilled physical therapy services.    Time 10    Period Weeks    Status On-going    Target Date 05/21/21      PT LONG TERM GOAL #3   Title Pt. will demonstrate FOTO outcome > or = 49% to indicated reduced disability due to condition.    Time 10    Period Weeks    Status On-going    Target Date 05/21/21      PT LONG TERM GOAL #4   Title Pt. will demonstrate lumbar extension > or = 75% to facilitate upright standing/walking posture for home and work activity.    Time 10    Period Weeks    Status Achieved    Target Date 05/21/21      PT LONG TERM GOAL #5   Title Pt. will demonstrate Lt hip flexion MMT > or = 4/5 to facilitate transfers, standing, walking at home/work.    Time 10    Period Weeks    Status On-going    Target Date 05/21/21      PT LONG TERM GOAL #6   Title Pt. will demonstrate/report ability to walk/standing > 1 hr for work related tasks.    Time 10    Period Weeks    Status On-going    Target Date 05/21/21      PT LONG TERM GOAL #7   Title Pt. will demonstrate bilateral SLS > 10 seconds to facilitate stability in ambulation.    Time 10    Period Weeks    Status On-going    Target Date 05/21/21  Plan - 03/31/21 1051     Clinical Impression Statement Lt hip strength deficits noted in flexion primarily and plan to continue to address to improve functional movement.  HIgher number back symptoms noted but performance in clinic and Pt. self report indicated reduced disability due to  those higher level symptom numbers reported.    Personal Factors and Comorbidities Other    Examination-Activity Limitations Sit;Sleep;Bed Mobility;Squat;Bend;Stairs;Carry;Stand;Transfers;Dressing;Hygiene/Grooming;Lift;Locomotion Level;Reach Overhead    Examination-Participation Restrictions Occupation;Meal Prep;Laundry;Interpersonal Relationship;Driving;Community Activity;Cleaning    Stability/Clinical Decision Making Evolving/Moderate complexity    Rehab Potential --   fair to good   PT Frequency 2x / week    PT Duration Other (comment)   10 weeks   PT Treatment/Interventions ADLs/Self Care Home Management;Cryotherapy;Electrical Stimulation;Iontophoresis 4mg /ml Dexamethasone;Moist Heat;Balance training;Traction;Therapeutic exercise;Therapeutic activities;Functional mobility training;Stair training;Gait training;Ultrasound;Neuromuscular re-education;Patient/family education;Passive range of motion;Spinal Manipulations;Joint Manipulations;Dry needling;Taping;Manual techniques    PT Next Visit Plan Progressive LE strengthening improvements, functional squat/stair navigation strengthening    PT Home Exercise Plan [id number]    Consulted and Agree with Plan of Care Patient             Patient will benefit from skilled therapeutic intervention in order to improve the following deficits and impairments:  Abnormal gait, Decreased endurance, Hypomobility, Decreased activity tolerance, Decreased strength, Pain, Difficulty walking, Decreased mobility, Decreased balance, Decreased range of motion, Impaired perceived functional ability, Improper body mechanics, Postural dysfunction, Impaired flexibility, Decreased coordination  Visit Diagnosis: Chronic bilateral low back pain without sciatica  Muscle weakness (generalized)  Pain in left hip  Abnormal posture  Difficulty in walking, not elsewhere classified     Problem List Patient Active Problem List   Diagnosis Date Noted   Body mass index  (BMI) 37.0-37.9, adult 03/14/2019   Lumbar stenosis without neurogenic claudication 03/14/2019   Hyperlipidemia 02/08/2019   Lumbar spondylosis 10/25/2018   Degeneration of lumbar intervertebral disc 10/01/2018   Surgical wound dehiscence 06/06/2018   Osteoarthritis of left hip 04/25/2018   Spinal stenosis of lumbar region 02/23/2018   Pain in left knee 01/10/2018   Trigger thumb of right hand 03/30/2017   Dyspnea 03/26/2013   Asthma vs VCD  02/28/2013   Hypertensive disorder 06/07/2012   Pyogenic granuloma 02/15/2012   Scot Jun, PT, DPT, OCS, ATC 03/31/21  11:05 AM    [facility] [address] [place], [place], [address] Phone: [phone]   Fax:  [phone]  Name: ASHANA TULLO MRN: [id number] Date of Birth: 1965-09-14

## 2021-04-01 ENCOUNTER — Encounter: Payer: Self-pay | Admitting: Specialist

## 2021-04-01 ENCOUNTER — Ambulatory Visit (INDEPENDENT_AMBULATORY_CARE_PROVIDER_SITE_OTHER): Payer: 59 | Admitting: Specialist

## 2021-04-01 VITALS — BP 140/87 | HR 86 | Ht 65.0 in | Wt 229.0 lb

## 2021-04-01 DIAGNOSIS — M48062 Spinal stenosis, lumbar region with neurogenic claudication: Secondary | ICD-10-CM

## 2021-04-01 DIAGNOSIS — Q7649 Other congenital malformations of spine, not associated with scoliosis: Secondary | ICD-10-CM

## 2021-04-01 DIAGNOSIS — M5136 Other intervertebral disc degeneration, lumbar region: Secondary | ICD-10-CM | POA: Diagnosis not present

## 2021-04-01 DIAGNOSIS — M47816 Spondylosis without myelopathy or radiculopathy, lumbar region: Secondary | ICD-10-CM | POA: Diagnosis not present

## 2021-04-01 DIAGNOSIS — R29898 Other symptoms and signs involving the musculoskeletal system: Secondary | ICD-10-CM

## 2021-04-01 NOTE — Patient Instructions (Signed)
Plan: Avoid bending, stooping and avoid lifting weights greater than 10 lbs. Avoid prolong standing and walking. Avoid frequent bending and stooping  No lifting greater than 10 lbs. May use ice or moist heat for pain. Weight loss is of benefit. Handicap license is approved. MRI lumbar spine as last study was over 1 year ago and she is still weak in left hip flexors.   Follow-Up Instructions: No follow-ups on file.

## 2021-04-01 NOTE — Progress Notes (Signed)
Office Visit Note   Patient: Darlene Crosby           Date of Birth: 02-03-1966           MRN: [id number] Visit Date: 04/01/2021              Requested by: Camillia Herter, NP [place],  [place] [address] PCP: Camillia Herter, NP   Assessment & Plan: Visit Diagnoses:  1. Spinal stenosis of lumbar region with neurogenic claudication   2. 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 Degeneration of lumbar intervertebral disc   5. Weakness of left hip     Plan: Avoid bending, stooping and avoid lifting weights greater than 10 lbs. Avoid prolong standing and walking. Avoid frequent bending and stooping  No lifting greater than 10 lbs. May use ice or moist heat for pain. Weight loss is of benefit. Handicap license is approved. MRI lumbar spine as last study was over 1 year ago and she is still weak in left hip flexors.   Follow-Up Instructions: Return in about 3 weeks (around 04/22/2021).   Orders:  No orders of the defined types were placed in this encounter.  No orders of the defined types were placed in this encounter.     Procedures: No procedures performed   Clinical Data: No additional findings.   Subjective: Chief Complaint  Patient presents with   Middle Back - Follow-up    56 year old with history of left THR and lumbago she has   Review of Systems   Objective: Vital Signs: BP 140/87 (BP Location: Left Arm, Patient Position: Sitting)    Pulse 86    Ht 5\' 5"  (1.651 m)    Wt 229 lb (103.9 kg)    LMP 05/04/2012    BMI 38.11 kg/m   Physical Exam  Ortho Exam  Specialty Comments:  No specialty comments available.  Imaging: No results found.   PMFS History: Patient Active Problem List   Diagnosis Date Noted   Body mass index (BMI) 37.0-37.9, adult 03/14/2019   Lumbar stenosis without neurogenic claudication 03/14/2019   Hyperlipidemia 02/08/2019   Lumbar spondylosis 10/25/2018    Degeneration of lumbar intervertebral disc 10/01/2018   Surgical wound dehiscence 06/06/2018   Osteoarthritis of left hip 04/25/2018   Spinal stenosis of lumbar region 02/23/2018   Pain in left knee 01/10/2018   Trigger thumb of right hand 03/30/2017   Dyspnea 03/26/2013   Asthma vs VCD  02/28/2013   Hypertensive disorder 06/07/2012   Pyogenic granuloma 02/15/2012   Past Medical History:  Diagnosis Date   Asthma    followed by pcp   Hypertension    OA (osteoarthritis)    left knee, right shoulder, left hip   Seasonal allergies    Wears glasses     Family History  Problem Relation Age of Onset   Hypertension Mother    Asthma Mother    Allergies Mother    Breast cancer Sister    Heart disease Sister    Hypertension Sister    Stroke Sister    Diabetes Sister    Colon cancer Neg Hx    Esophageal cancer Neg Hx    Stomach cancer Neg Hx    Pancreatic cancer Neg Hx     Past Surgical History:  Procedure Laterality Date   ANTERIOR HIP REVISION Left 06/06/2018   Procedure: LEFT HIP WOUND DEHISCENCE POSSIBLE HEAD AND LINER  EXCHANGE;  Surgeon: Rod Can, MD;  [facility];  Service: Orthopedics;  Laterality: Left;   COLONOSCOPY  2015   DIAGNOSTIC LAPAROSCOPY     tubal preg-took ovary and tube   LAPAROSCOPY FOR ECTOPIC PREGNANCY  1990s   LEFT SALPINGECTOMY AND RIGHT OOPHORECTOMY FOR ABNORMALITY   MASS EXCISION  02/15/2012   Procedure: EXCISION MASS;  Surgeon: Schuyler Amor, MD;  Location: [place];  Service: Orthopedics;  Laterality: Right;  Excision of Right Small Volar Mass   MYELOGRAM     TONSILLECTOMY  age 16   TOTAL HIP ARTHROPLASTY Left 04/25/2018   Procedure: TOTAL HIP ARTHROPLASTY ANTERIOR APPROACH;  Surgeon: Rod Can, MD;  [facility];  Service: Orthopedics;  Laterality: Left;   TRIGGER FINGER RELEASE Right 2018   thumb   WISDOM TOOTH EXTRACTION  age 70   Social History   Occupational History   Not on file  Tobacco Use    Smoking status: Never   Smokeless tobacco: Never  Vaping Use   Vaping Use: Never used  Substance and Sexual Activity   Alcohol use: No   Drug use: Never   Sexual activity: Not on file

## 2021-04-05 ENCOUNTER — Ambulatory Visit (INDEPENDENT_AMBULATORY_CARE_PROVIDER_SITE_OTHER): Payer: 59 | Admitting: Rehabilitative and Restorative Service Providers"

## 2021-04-05 ENCOUNTER — Encounter: Payer: Self-pay | Admitting: Rehabilitative and Restorative Service Providers"

## 2021-04-05 ENCOUNTER — Other Ambulatory Visit: Payer: Self-pay

## 2021-04-05 DIAGNOSIS — M6281 Muscle weakness (generalized): Secondary | ICD-10-CM

## 2021-04-05 DIAGNOSIS — G8929 Other chronic pain: Secondary | ICD-10-CM

## 2021-04-05 DIAGNOSIS — M25552 Pain in left hip: Secondary | ICD-10-CM

## 2021-04-05 DIAGNOSIS — R293 Abnormal posture: Secondary | ICD-10-CM

## 2021-04-05 DIAGNOSIS — R262 Difficulty in walking, not elsewhere classified: Secondary | ICD-10-CM

## 2021-04-05 DIAGNOSIS — M545 Low back pain, unspecified: Secondary | ICD-10-CM

## 2021-04-05 NOTE — Therapy (Signed)
[place], [place], [address] Phone: [phone]   Fax:  [phone]  Physical Therapy Treatment  Patient Details  Name: Darlene Crosby MRN: [id number] Date of Birth: [birth year] Referring Provider (PT): Jessy Oto, MD   Encounter Date: 04/05/2021   PT End of Session - 04/05/21 0928     Visit Number 7    Number of Visits 20    Date for PT Re-Evaluation 05/21/21    Progress Note Due on Visit 10    PT Start Time 0929    PT Stop Time 1008    PT Time Calculation (min) 39 min    Activity Tolerance Patient tolerated treatment well    Behavior During Therapy [facility] for tasks assessed/performed             Past Medical History:  Diagnosis Date   Asthma    followed by pcp   Hypertension    OA (osteoarthritis)    left knee, right shoulder, left hip   Seasonal allergies    Wears glasses     Past Surgical History:  Procedure Laterality Date   ANTERIOR HIP REVISION Left 06/06/2018   Procedure: LEFT HIP WOUND DEHISCENCE POSSIBLE HEAD AND LINER EXCHANGE;  Surgeon: Rod Can, MD;  [facility]: [facility];  Service: Orthopedics;  Laterality: Left;   COLONOSCOPY  2015   DIAGNOSTIC LAPAROSCOPY     tubal preg-took ovary and tube   LAPAROSCOPY FOR ECTOPIC PREGNANCY  1990s   LEFT SALPINGECTOMY AND RIGHT OOPHORECTOMY FOR ABNORMALITY   MASS EXCISION  02/15/2012   Procedure: EXCISION MASS;  Surgeon: Schuyler Amor, MD;  Location: [place];  Service: Orthopedics;  Laterality: Right;  Excision of Right Small Volar Mass   MYELOGRAM     TONSILLECTOMY  age 22   TOTAL HIP ARTHROPLASTY Left 04/25/2018   Procedure: TOTAL HIP ARTHROPLASTY ANTERIOR APPROACH;  Surgeon: Rod Can, MD;  [facility];  Service: Orthopedics;  Laterality: Left;   TRIGGER FINGER RELEASE Right 2018   thumb   WISDOM TOOTH EXTRACTION  age 34    There were no vitals filed for this visit.   Subjective Assessment - 04/05/21  0932     Subjective Pt. indicated "she's healing"  and feeling stiff in morning.  7.5 for symptoms.  Pt. indicated she uses the healing description to replace any hurting.    Pertinent History Multiple lumbar DDD, mild lumbar spinal stenosis, left hip flexor weakness post left hip anterior THA 2020.   - seen Oct [address]22 [address] for Rt ankle    Limitations Standing;Walking;House hold activities;Lifting;Sitting    Patient Stated Goals Reduce pain    Currently in Pain? Yes    Pain Score 7     Pain Location Back    Pain Orientation Lower    Pain Descriptors / Indicators Tightness    Pain Type Chronic pain    Pain Onset More than a month ago    Pain Frequency Constant    Aggravating Factors  stiffness in morning noted primarily today    Pain Relieving Factors ice, exercise use, movement.                               [place] Adult PT Treatment/Exercise - 04/05/21 0001       Lumbar Exercises: Machines for Strengthening   Leg Press Double leg 81 lbs 2 x 15, single leg 43 lbs 2  x 15 bilateral      Lumbar Exercises: Standing   Other Standing Lumbar Exercises 6 inch 2x15  bilateral fwd no hand assist, lateral step down slowly x 15 4 inch step bilaterally                       PT Short Term Goals - 03/29/21 0944       PT SHORT TERM GOAL #1   Title Patient will demonstrate independent use of home exercise program to maintain progress from in clinic treatments.    Time 3    Period Weeks    Status Achieved    Target Date 04/02/21               PT Long Term Goals - 03/31/21 1104       PT LONG TERM GOAL #1   Title Patient will demonstrate/report pain at worst less than or equal to 2/10 to facilitate minimal limitation in daily activity secondary to pain symptoms.    Time 10    Period Weeks    Status On-going    Target Date 05/21/21      PT LONG TERM GOAL #2   Title Patient will demonstrate independent use of home exercise program to  facilitate ability to maintain/progress functional gains from skilled physical therapy services.    Time 10    Period Weeks    Status On-going    Target Date 05/21/21      PT LONG TERM GOAL #3   Title Pt. will demonstrate FOTO outcome > or = 49% to indicated reduced disability due to condition.    Time 10    Period Weeks    Status On-going    Target Date 05/21/21      PT LONG TERM GOAL #4   Title Pt. will demonstrate lumbar extension > or = 75% to facilitate upright standing/walking posture for home and work activity.    Time 10    Period Weeks    Status Achieved    Target Date 05/21/21      PT LONG TERM GOAL #5   Title Pt. will demonstrate Lt hip flexion MMT > or = 4/5 to facilitate transfers, standing, walking at home/work.    Time 10    Period Weeks    Status On-going    Target Date 05/21/21      PT LONG TERM GOAL #6   Title Pt. will demonstrate/report ability to walk/standing > 1 hr for work related tasks.    Time 10    Period Weeks    Status On-going    Target Date 05/21/21      PT LONG TERM GOAL #7   Title Pt. will demonstrate bilateral SLS > 10 seconds to facilitate stability in ambulation.    Time 10    Period Weeks    Status On-going    Target Date 05/21/21                   Plan - 04/05/21 0944     Clinical Impression Statement Continued time spent in clinic to improve loading control and strength in bilateral LE, Lt specifically to reduce difficulty c functional movements.  Continued higher level of symptoms indicated by symptom reporting but tolerance to intervention within clinic without pain increase reported.  Pt. to benefit from continued home exercise plan for symptom management as well as progression in clinic.    Personal Factors and Comorbidities Other  Examination-Activity Limitations Sit;Sleep;Bed Mobility;Squat;Bend;Stairs;Carry;Stand;Transfers;Dressing;Hygiene/Grooming;Lift;Locomotion Level;Reach Overhead    Examination-Participation  Restrictions Occupation;Meal Prep;Laundry;Interpersonal Relationship;Driving;Community Activity;Cleaning    Stability/Clinical Decision Making Evolving/Moderate complexity    Rehab Potential --   fair to good   PT Frequency 2x / week    PT Duration Other (comment)   10 weeks   PT Treatment/Interventions ADLs/Self Care Home Management;Cryotherapy;Electrical Stimulation;Iontophoresis 4mg /ml Dexamethasone;Moist Heat;Balance training;Traction;Therapeutic exercise;Therapeutic activities;Functional mobility training;Stair training;Gait training;Ultrasound;Neuromuscular re-education;Patient/family education;Passive range of motion;Spinal Manipulations;Joint Manipulations;Dry needling;Taping;Manual techniques    PT Next Visit Plan Progressive LE strengthening improvements, functional squat/stairs    PT Home Exercise Plan [id number]    Consulted and Agree with Plan of Care Patient             Patient will benefit from skilled therapeutic intervention in order to improve the following deficits and impairments:  Abnormal gait, Decreased endurance, Hypomobility, Decreased activity tolerance, Decreased strength, Pain, Difficulty walking, Decreased mobility, Decreased balance, Decreased range of motion, Impaired perceived functional ability, Improper body mechanics, Postural dysfunction, Impaired flexibility, Decreased coordination  Visit Diagnosis: Chronic bilateral low back pain without sciatica  Muscle weakness (generalized)  Pain in left hip  Abnormal posture  Difficulty in walking, not elsewhere classified     Problem List Patient Active Problem List   Diagnosis Date Noted   Body mass index (BMI) 37.0-37.9, adult 03/14/2019   Lumbar stenosis without neurogenic claudication 03/14/2019   Hyperlipidemia 02/08/2019   Lumbar spondylosis 10/25/2018   Degeneration of lumbar intervertebral disc 10/01/2018   Surgical wound dehiscence 06/06/2018   Osteoarthritis of left hip 04/25/2018   Spinal  stenosis of lumbar region 02/23/2018   Pain in left knee 01/10/2018   Trigger thumb of right hand 03/30/2017   Dyspnea 03/26/2013   Asthma vs VCD  02/28/2013   Hypertensive disorder 06/07/2012   Pyogenic granuloma 02/15/2012    Scot Jun, PT, DPT, OCS, ATC 04/05/21  10:12 AM    [facility] [address] [place], [place], [address] Phone: [phone]   Fax:  [phone]  Name: Darlene Crosby MRN: [id number] Date of Birth: 02/16/1966

## 2021-04-07 ENCOUNTER — Other Ambulatory Visit: Payer: Self-pay

## 2021-04-07 ENCOUNTER — Ambulatory Visit: Payer: 59 | Admitting: Rehabilitative and Restorative Service Providers"

## 2021-04-07 DIAGNOSIS — R293 Abnormal posture: Secondary | ICD-10-CM

## 2021-04-07 DIAGNOSIS — M25552 Pain in left hip: Secondary | ICD-10-CM

## 2021-04-07 DIAGNOSIS — G8929 Other chronic pain: Secondary | ICD-10-CM

## 2021-04-07 DIAGNOSIS — M545 Low back pain, unspecified: Secondary | ICD-10-CM | POA: Diagnosis not present

## 2021-04-07 DIAGNOSIS — M6281 Muscle weakness (generalized): Secondary | ICD-10-CM | POA: Diagnosis not present

## 2021-04-07 DIAGNOSIS — R262 Difficulty in walking, not elsewhere classified: Secondary | ICD-10-CM

## 2021-04-07 NOTE — Therapy (Signed)
[facility] [address] [place], [place], [address] Phone: [phone]   Fax:  [phone]  Physical Therapy Treatment  Patient Details  Name: Darlene Crosby MRN: [id number] Date of Birth: August 03, 1965 Referring Provider (PT): Jessy Oto, MD   Encounter Date: 04/07/2021   PT End of Session - 04/07/21 0935     Visit Number 8    Number of Visits 20    Date for PT Re-Evaluation 05/21/21    Progress Note Due on Visit 10    PT Start Time 0928    PT Stop Time 1008    PT Time Calculation (min) 40 min    Activity Tolerance Patient limited by fatigue    Behavior During Therapy [facility] for tasks assessed/performed             Past Medical History:  Diagnosis Date   Asthma    followed by pcp   Hypertension    OA (osteoarthritis)    left knee, right shoulder, left hip   Seasonal allergies    Wears glasses     Past Surgical History:  Procedure Laterality Date   ANTERIOR HIP REVISION Left 06/06/2018   Procedure: LEFT HIP WOUND DEHISCENCE POSSIBLE HEAD AND LINER EXCHANGE;  Surgeon: Rod Can, MD;  Location: [facility];  Service: Orthopedics;  Laterality: Left;   COLONOSCOPY  2015   DIAGNOSTIC LAPAROSCOPY     tubal preg-took ovary and tube   LAPAROSCOPY FOR ECTOPIC PREGNANCY  1990s   LEFT SALPINGECTOMY AND RIGHT OOPHORECTOMY FOR ABNORMALITY   MASS EXCISION  02/15/2012   Procedure: EXCISION MASS;  Surgeon: Schuyler Amor, MD;  Location: [place];  Service: Orthopedics;  Laterality: Right;  Excision of Right Small Volar Mass   MYELOGRAM     TONSILLECTOMY  age 14   TOTAL HIP ARTHROPLASTY Left 04/25/2018   Procedure: TOTAL HIP ARTHROPLASTY ANTERIOR APPROACH;  Surgeon: Rod Can, MD;  Location: [facility];  Service: Orthopedics;  Laterality: Left;   TRIGGER FINGER RELEASE Right 2018   thumb   WISDOM TOOTH EXTRACTION  age 36    There were no vitals filed for this visit.   Subjective Assessment - 04/07/21 0933      Subjective Pt. indicated no specific changes from last visit to today.  Reported she "didn't want to complain."    Pertinent History Multiple lumbar DDD, mild lumbar spinal stenosis, left hip flexor weakness post left hip anterior THA 2020.   - seen Oct 2022 at [address] for Rt ankle    Limitations Standing;Walking;House hold activities;Lifting;Sitting    Patient Stated Goals Reduce pain    Currently in Pain? Yes    Pain Score 7     Pain Location Back    Pain Orientation Lower    Pain Descriptors / Indicators Tightness;Aching    Pain Type Chronic pain    Pain Onset More than a month ago    Pain Frequency Constant    Aggravating Factors  various stiffness c inactivity    Pain Relieving Factors stretching helps                [facility] PT Assessment - 04/07/21 0001       Assessment   Medical Diagnosis M48.062 (ICD-10-CM) - Spinal stenosis of lumbar region with neurogenic claudication  M47.816 (ICD-10-CM) - Lumbar spondylosis  M51.36 (ICD-10-CM) - Degeneration of lumbar intervertebral disc  U31.497 (ICD-10-CM) - History of total left hip replacement    Referring Provider (PT) Jessy Oto,  MD    Onset Date/Surgical Date 02/28/18    Hand Dominance Right                           [facility] Adult PT Treatment/Exercise - 04/07/21 0001       Lumbar Exercises: Stretches   Lower Trunk Rotation 3 reps   15 sec x 3 bilateral   Other Lumbar Stretch Exercise figure 4 into flexion and push away in supine 30 sec x 3 Lt leg      Lumbar Exercises: Aerobic   Nustep Lvl 6 5 mins, Lvl 5 5 mins      Lumbar Exercises: Machines for Strengthening   Leg Press Double leg 75 lbs 3 x 15, 43 lb 2 x 15 bilateral single leg      Lumbar Exercises: Seated   Other Seated Lumbar Exercises seated SLR 2 x 10 bilateral      Lumbar Exercises: Supine   Bridge 15 reps    Other Supine Lumbar Exercises supine hip flexion isometric hold 5 sec x 15 Lt leg c ipsilateral UE press down hold                        PT Short Term Goals - 03/29/21 0944       PT SHORT TERM GOAL #1   Title Patient will demonstrate independent use of home exercise program to maintain progress from in clinic treatments.    Time 3    Period Weeks    Status Achieved    Target Date 04/02/21               PT Long Term Goals - 03/31/21 1104       PT LONG TERM GOAL #1   Title Patient will demonstrate/report pain at worst less than or equal to 2/10 to facilitate minimal limitation in daily activity secondary to pain symptoms.    Time 10    Period Weeks    Status On-going    Target Date 05/21/21      PT LONG TERM GOAL #2   Title Patient will demonstrate independent use of home exercise program to facilitate ability to maintain/progress functional gains from skilled physical therapy services.    Time 10    Period Weeks    Status On-going    Target Date 05/21/21      PT LONG TERM GOAL #3   Title Pt. will demonstrate FOTO outcome > or = 49% to indicated reduced disability due to condition.    Time 10    Period Weeks    Status On-going    Target Date 05/21/21      PT LONG TERM GOAL #4   Title Pt. will demonstrate lumbar extension > or = 75% to facilitate upright standing/walking posture for home and work activity.    Time 10    Period Weeks    Status Achieved    Target Date 05/21/21      PT LONG TERM GOAL #5   Title Pt. will demonstrate Lt hip flexion MMT > or = 4/5 to facilitate transfers, standing, walking at home/work.    Time 10    Period Weeks    Status On-going    Target Date 05/21/21      PT LONG TERM GOAL #6   Title Pt. will demonstrate/report ability to walk/standing > 1 hr for work related tasks.    Time 10  Period Weeks    Status On-going    Target Date 05/21/21      PT LONG TERM GOAL #7   Title Pt. will demonstrate bilateral SLS > 10 seconds to facilitate stability in ambulation.    Time 10    Period Weeks    Status On-going    Target Date 05/21/21                    Plan - 04/07/21 0955     Clinical Impression Statement More fatigue noted overall, primarily with lowe extremity strengthening intervention.  Adjusted accordingly to continue to address weaknesses but avoid too much.  Pt. to continue to benefit from improvement of strength in Lt hip/quad to faciltiate improved functional movement control.    Personal Factors and Comorbidities Other    Examination-Activity Limitations Sit;Sleep;Bed Mobility;Squat;Bend;Stairs;Carry;Stand;Transfers;Dressing;Hygiene/Grooming;Lift;Locomotion Level;Reach Overhead    Examination-Participation Restrictions Occupation;Meal Prep;Laundry;Interpersonal Relationship;Driving;Community Activity;Cleaning    Stability/Clinical Decision Making Evolving/Moderate complexity    Rehab Potential --   fair to good   PT Frequency 2x / week    PT Duration Other (comment)   10 weeks   PT Treatment/Interventions ADLs/Self Care Home Management;Cryotherapy;Electrical Stimulation;Iontophoresis 4mg /ml Dexamethasone;Moist Heat;Balance training;Traction;Therapeutic exercise;Therapeutic activities;Functional mobility training;Stair training;Gait training;Ultrasound;Neuromuscular re-education;Patient/family education;Passive range of motion;Spinal Manipulations;Joint Manipulations;Dry needling;Taping;Manual techniques    PT Next Visit Plan Reassessment objective data    PT Home Exercise Plan [id number]    Consulted and Agree with Plan of Care Patient             Patient will benefit from skilled therapeutic intervention in order to improve the following deficits and impairments:  Abnormal gait, Decreased endurance, Hypomobility, Decreased activity tolerance, Decreased strength, Pain, Difficulty walking, Decreased mobility, Decreased balance, Decreased range of motion, Impaired perceived functional ability, Improper body mechanics, Postural dysfunction, Impaired flexibility, Decreased coordination  Visit  Diagnosis: Chronic bilateral low back pain without sciatica  Muscle weakness (generalized)  Pain in left hip  Abnormal posture  Difficulty in walking, not elsewhere classified     Problem List Patient Active Problem List   Diagnosis Date Noted   Body mass index (BMI) 37.0-37.9, adult 03/14/2019   Lumbar stenosis without neurogenic claudication 03/14/2019   Hyperlipidemia 02/08/2019   Lumbar spondylosis 10/25/2018   Degeneration of lumbar intervertebral disc 10/01/2018   Surgical wound dehiscence 06/06/2018   Osteoarthritis of left hip 04/25/2018   Spinal stenosis of lumbar region 02/23/2018   Pain in left knee 01/10/2018   Trigger thumb of right hand 03/30/2017   Dyspnea 03/26/2013   Asthma vs VCD  02/28/2013   Hypertensive disorder 06/07/2012   Pyogenic granuloma 02/15/2012    Scot Jun, PT, DPT, OCS, ATC 04/07/21  10:04 AM    [facility] [address] [place], [place], [address] Phone: [phone]   Fax:  [phone]  Name: Darlene Crosby MRN: [id number] Date of Birth: 23-Nov-1965

## 2021-04-08 ENCOUNTER — Other Ambulatory Visit: Payer: Self-pay | Admitting: Family

## 2021-04-08 DIAGNOSIS — E785 Hyperlipidemia, unspecified: Secondary | ICD-10-CM

## 2021-04-08 DIAGNOSIS — I10 Essential (primary) hypertension: Secondary | ICD-10-CM

## 2021-04-12 ENCOUNTER — Telehealth: Payer: Self-pay | Admitting: Rehabilitative and Restorative Service Providers"

## 2021-04-12 ENCOUNTER — Encounter: Payer: 59 | Admitting: Rehabilitative and Restorative Service Providers"

## 2021-04-12 NOTE — Telephone Encounter (Signed)
Patient indicated a death in family upon follow up phone call after missed appointment today.    Scot Jun, PT, DPT, OCS, ATC 04/12/21  9:51 AM

## 2021-04-14 ENCOUNTER — Ambulatory Visit: Payer: 59 | Admitting: Rehabilitative and Restorative Service Providers"

## 2021-04-14 ENCOUNTER — Encounter: Payer: Self-pay | Admitting: Rehabilitative and Restorative Service Providers"

## 2021-04-14 ENCOUNTER — Other Ambulatory Visit: Payer: Self-pay

## 2021-04-14 DIAGNOSIS — R293 Abnormal posture: Secondary | ICD-10-CM

## 2021-04-14 DIAGNOSIS — M25552 Pain in left hip: Secondary | ICD-10-CM | POA: Diagnosis not present

## 2021-04-14 DIAGNOSIS — R262 Difficulty in walking, not elsewhere classified: Secondary | ICD-10-CM

## 2021-04-14 DIAGNOSIS — M545 Low back pain, unspecified: Secondary | ICD-10-CM | POA: Diagnosis not present

## 2021-04-14 DIAGNOSIS — M6281 Muscle weakness (generalized): Secondary | ICD-10-CM

## 2021-04-14 DIAGNOSIS — G8929 Other chronic pain: Secondary | ICD-10-CM

## 2021-04-14 NOTE — Therapy (Signed)
[facility] [address] [place], [place], [address] Phone: [phone]   Fax:  [phone]  Physical Therapy Treatment  Patient Details  Name: Darlene Crosby MRN: [id number] Date of Birth: 15-Sep-1965 Referring Provider (PT): Jessy Oto, MD   Encounter Date: 04/14/2021   PT End of Session - 04/14/21 1000     Visit Number 9    Number of Visits 20    Date for PT Re-Evaluation 05/21/21    Progress Note Due on Visit 10    PT Start Time 0928    PT Stop Time 1008    PT Time Calculation (min) 40 min    Activity Tolerance Patient tolerated treatment well    Behavior During Therapy [facility] for tasks assessed/performed             Past Medical History:  Diagnosis Date   Asthma    followed by pcp   Hypertension    OA (osteoarthritis)    left knee, right shoulder, left hip   Seasonal allergies    Wears glasses     Past Surgical History:  Procedure Laterality Date   ANTERIOR HIP REVISION Left 06/06/2018   Procedure: LEFT HIP WOUND DEHISCENCE POSSIBLE HEAD AND LINER EXCHANGE;  Surgeon: Rod Can, MD;  Location: [facility];  Service: Orthopedics;  Laterality: Left;   COLONOSCOPY  2015   DIAGNOSTIC LAPAROSCOPY     tubal preg-took ovary and tube   LAPAROSCOPY FOR ECTOPIC PREGNANCY  1990s   LEFT SALPINGECTOMY AND RIGHT OOPHORECTOMY FOR ABNORMALITY   MASS EXCISION  02/15/2012   Procedure: EXCISION MASS;  Surgeon: Schuyler Amor, MD;  Location: [place];  Service: Orthopedics;  Laterality: Right;  Excision of Right Small Volar Mass   MYELOGRAM     TONSILLECTOMY  age 56   TOTAL HIP ARTHROPLASTY Left 04/25/2018   Procedure: TOTAL HIP ARTHROPLASTY ANTERIOR APPROACH;  Surgeon: Rod Can, MD;  Location: [facility];  Service: Orthopedics;  Laterality: Left;   TRIGGER FINGER RELEASE Right 2018   thumb   WISDOM TOOTH EXTRACTION  age 56    There were no vitals filed for this visit.   Subjective Assessment - 04/14/21  0936     Subjective Pt. stated moderate pain today upon arrival (7/10).  Pt. reported having to miss Monday's visit due to abrupt news of family member passing away.  Pt. has not worked since the weekend and has been focused primarily on family care.    Pertinent History Multiple lumbar DDD, mild lumbar spinal stenosis, left hip flexor weakness post left hip anterior THA 2020.   - seen Oct 2022 [address] for Rt ankle    Limitations Standing;Walking;House hold activities;Lifting;Sitting    Patient Stated Goals Reduce pain    Currently in Pain? Yes    Pain Score 7     Pain Location Back    Pain Orientation Lower    Pain Descriptors / Indicators Aching;Tightness    Pain Type Chronic pain    Pain Onset More than a month ago    Aggravating Factors  cooking this morning    Pain Relieving Factors nothing specific inidcated today                [facility] PT Assessment - 04/14/21 0001       Assessment   Medical Diagnosis M48.062 (ICD-10-CM) - Spinal stenosis of lumbar region with neurogenic claudication  M47.816 (ICD-10-CM) - Lumbar spondylosis  M51.36 (ICD-10-CM) - Degeneration of lumbar intervertebral disc  N46.270 (ICD-10-CM) - History of total left hip replacement    Referring Provider (PT) Jessy Oto, MD    Onset Date/Surgical Date 02/28/18    Hand Dominance Right      AROM   Lumbar Extension 100% WFL                           [facility] Adult PT Treatment/Exercise - 04/14/21 0001       Lumbar Exercises: Stretches   Other Lumbar Stretch Exercise standing lumbar extension x 10      Lumbar Exercises: Aerobic   Nustep Lvl 5 15 mins UE/LE      Lumbar Exercises: Machines for Strengthening   Leg Press Double leg 75 lbs 2 x 15, 37 lb 2 x 15 bilateral single leg      Lumbar Exercises: Standing   Row Both   3 x 15   Theraband Level (Row) Level 4 (Blue)    Other Standing Lumbar Exercises tband chest press standing c TrA contraction hold 3 x 15    Other Standing  Lumbar Exercises flight of stairs up/down x 2 c reciprocal gait                       PT Short Term Goals - 03/29/21 0944       PT SHORT TERM GOAL #1   Title Patient will demonstrate independent use of home exercise program to maintain progress from in clinic treatments.    Time 3    Period Weeks    Status Achieved    Target Date 04/02/21               PT Long Term Goals - 03/31/21 1104       PT LONG TERM GOAL #1   Title Patient will demonstrate/report pain at worst less than or equal to 2/10 to facilitate minimal limitation in daily activity secondary to pain symptoms.    Time 10    Period Weeks    Status On-going    Target Date 05/21/21      PT LONG TERM GOAL #2   Title Patient will demonstrate independent use of home exercise program to facilitate ability to maintain/progress functional gains from skilled physical therapy services.    Time 10    Period Weeks    Status On-going    Target Date 05/21/21      PT LONG TERM GOAL #3   Title Pt. will demonstrate FOTO outcome > or = 49% to indicated reduced disability due to condition.    Time 10    Period Weeks    Status On-going    Target Date 05/21/21      PT LONG TERM GOAL #4   Title Pt. will demonstrate lumbar extension > or = 75% to facilitate upright standing/walking posture for home and work activity.    Time 10    Period Weeks    Status Achieved    Target Date 05/21/21      PT LONG TERM GOAL #5   Title Pt. will demonstrate Lt hip flexion MMT > or = 4/5 to facilitate transfers, standing, walking at home/work.    Time 10    Period Weeks    Status On-going    Target Date 05/21/21      PT LONG TERM GOAL #6   Title Pt. will demonstrate/report ability to walk/standing > 1 hr for work related tasks.  Time 10    Period Weeks    Status On-going    Target Date 05/21/21      PT LONG TERM GOAL #7   Title Pt. will demonstrate bilateral SLS > 10 seconds to facilitate stability in ambulation.     Time 10    Period Weeks    Status On-going    Target Date 05/21/21                   Plan - 04/14/21 1000     Clinical Impression Statement Pt. returned today following recent death in family.  Accordingly, Pt. has not worked and reported overall self care activity (HEP) has not been a focus in last few days.  Utilized today to promote return to some activity to promote lumbar mobility, core/hip strength to progress control and tolerance to movements in daily activity.    Personal Factors and Comorbidities Other    Examination-Activity Limitations Sit;Sleep;Bed Mobility;Squat;Bend;Stairs;Carry;Stand;Transfers;Dressing;Hygiene/Grooming;Lift;Locomotion Level;[place] Overhead    Examination-Participation Restrictions Occupation;Meal Prep;Laundry;Interpersonal Relationship;Driving;Community Activity;Cleaning    Stability/Clinical Decision Making Evolving/Moderate complexity    Rehab Potential --   fair to good   PT Frequency 2x / week    PT Duration Other (comment)   10 weeks   PT Treatment/Interventions ADLs/Self Care Home Management;Cryotherapy;Electrical Stimulation;Iontophoresis 4mg /ml Dexamethasone;Moist Heat;Balance training;Traction;Therapeutic exercise;Therapeutic activities;Functional mobility training;Stair training;Gait training;Ultrasound;Neuromuscular re-education;Patient/family education;Passive range of motion;Spinal Manipulations;Joint Manipulations;Dry needling;Taping;Manual techniques    PT Next Visit Plan Progress note next visit. FOTO reassessment possible.    PT Home Exercise Plan [id number]    Consulted and Agree with Plan of Care Patient             Patient will benefit from skilled therapeutic intervention in order to improve the following deficits and impairments:  Abnormal gait, Decreased endurance, Hypomobility, Decreased activity tolerance, Decreased strength, Pain, Difficulty walking, Decreased mobility, Decreased balance, Decreased range of motion, Impaired  perceived functional ability, Improper body mechanics, Postural dysfunction, Impaired flexibility, Decreased coordination  Visit Diagnosis: Chronic bilateral low back pain without sciatica  Muscle weakness (generalized)  Pain in left hip  Abnormal posture  Difficulty in walking, not elsewhere classified     Problem List Patient Active Problem List   Diagnosis Date Noted   Body mass index (BMI) 37.0-37.9, adult 03/14/2019   Lumbar stenosis without neurogenic claudication 03/14/2019   Hyperlipidemia 02/08/2019   Lumbar spondylosis 10/25/2018   Degeneration of lumbar intervertebral disc 10/01/2018   Surgical wound dehiscence 06/06/2018   Osteoarthritis of left hip 04/25/2018   Spinal stenosis of lumbar region 02/23/2018   Pain in left knee 01/10/2018   Trigger thumb of right hand 03/30/2017   Dyspnea 03/26/2013   Asthma vs VCD  02/28/2013   Hypertensive disorder 06/07/2012   Pyogenic granuloma 02/15/2012    Scot Jun, PT, DPT, OCS, ATC 04/14/21  10:07 AM    [facility] [address] [place], [place], [address] Phone: [phone]   Fax:  [phone]  Name: Darlene Crosby MRN: [id number] Date of Birth: 03/12/1965

## 2021-04-19 ENCOUNTER — Encounter: Payer: 59 | Admitting: Rehabilitative and Restorative Service Providers"

## 2021-04-21 ENCOUNTER — Ambulatory Visit: Payer: 59 | Admitting: Rehabilitative and Restorative Service Providers"

## 2021-04-21 ENCOUNTER — Other Ambulatory Visit: Payer: Self-pay

## 2021-04-21 ENCOUNTER — Encounter: Payer: Self-pay | Admitting: Rehabilitative and Restorative Service Providers"

## 2021-04-21 DIAGNOSIS — M6281 Muscle weakness (generalized): Secondary | ICD-10-CM | POA: Diagnosis not present

## 2021-04-21 DIAGNOSIS — G8929 Other chronic pain: Secondary | ICD-10-CM

## 2021-04-21 DIAGNOSIS — M545 Low back pain, unspecified: Secondary | ICD-10-CM | POA: Diagnosis not present

## 2021-04-21 DIAGNOSIS — R293 Abnormal posture: Secondary | ICD-10-CM | POA: Diagnosis not present

## 2021-04-21 DIAGNOSIS — M25552 Pain in left hip: Secondary | ICD-10-CM

## 2021-04-21 DIAGNOSIS — R262 Difficulty in walking, not elsewhere classified: Secondary | ICD-10-CM

## 2021-04-21 NOTE — Therapy (Signed)
[facility] [address] [place], [place], [address] Phone: [phone]   Fax:  [phone]  Physical Therapy Treatment [place] [facility] Note  Patient Details  Name: Darlene Crosby MRN: [id number] Date of Birth: 04-Nov-1965 Referring Provider (PT): Jessy Oto, MD   Encounter Date: 04/21/2021  Progress Note Reporting Period 03/12/2021 to 04/21/2021  See note below for Objective Data and Assessment of Progress/Goals.       PT End of Session - 04/21/21 0932     Visit Number 10    Number of Visits 20    Date for PT Re-Evaluation 05/21/21    Progress Note Due on Visit 20    PT Start Time 0930    PT Stop Time 1009    PT Time Calculation (min) 39 min    Activity Tolerance Patient tolerated treatment well    Behavior During Therapy WFL for tasks assessed/performed             Past Medical History:  Diagnosis Date   Asthma    followed by pcp   Hypertension    OA (osteoarthritis)    left knee, right shoulder, left hip   Seasonal allergies    Wears glasses     Past Surgical History:  Procedure Laterality Date   ANTERIOR HIP REVISION Left 06/06/2018   Procedure: LEFT HIP WOUND DEHISCENCE POSSIBLE HEAD AND LINER EXCHANGE;  Surgeon: Rod Can, MD;  Location: [facility];  Service: Orthopedics;  Laterality: Left;   COLONOSCOPY  2015   DIAGNOSTIC LAPAROSCOPY     tubal preg-took ovary and tube   LAPAROSCOPY FOR ECTOPIC PREGNANCY  1990s   LEFT SALPINGECTOMY AND RIGHT OOPHORECTOMY FOR ABNORMALITY   MASS EXCISION  02/15/2012   Procedure: EXCISION MASS;  Surgeon: Schuyler Amor, MD;  Location: [place];  Service: Orthopedics;  Laterality: Right;  Excision of Right Small Volar Mass   MYELOGRAM     TONSILLECTOMY  age 19   TOTAL HIP ARTHROPLASTY Left 04/25/2018   Procedure: TOTAL HIP ARTHROPLASTY ANTERIOR APPROACH;  Surgeon: Rod Can, MD;  Location: [facility];  Service: Orthopedics;  Laterality: Left;   TRIGGER FINGER  RELEASE Right 2018   thumb   WISDOM TOOTH EXTRACTION  age 70    There were no vitals filed for this visit.   Subjective Assessment - 04/21/21 0932     Subjective Pt indicated quite a bit better +5 at this time.   Chief complaint of walking/bending at this time noted.  Pt. indicated she felt like the standing/cooking tolerance has improved, and walking a little bit more.    Pertinent History Multiple lumbar DDD, mild lumbar spinal stenosis, left hip flexor weakness post left hip anterior THA 2020.   - seen Oct 2022 at [address] for Rt ankle    Limitations Standing;Walking;House hold activities;Lifting;Sitting    How long can you stand comfortably? 30 mins    How long can you walk comfortably? 30-35 mins    Patient Stated Goals Reduce pain    Currently in Pain? Yes    Pain Score 6     Pain Location Back    Pain Orientation Lower    Pain Descriptors / Indicators Aching;Tightness    Pain Onset More than a month ago    Pain Frequency Constant    Aggravating Factors  standing/walking, bending    Pain Relieving Factors rest, stretching, ice                [facility] PT Assessment -  04/21/21 0001       Assessment   Medical Diagnosis M48.062 (ICD-10-CM) - Spinal stenosis of lumbar region with neurogenic claudication  M47.816 (ICD-10-CM) - Lumbar spondylosis  M51.36 (ICD-10-CM) - Degeneration of lumbar intervertebral disc  Z96.642 (ICD-10-CM) - History of total left hip replacement    Referring Provider (PT) Jessy Oto, MD    Onset Date/Surgical Date 02/28/18    Hand Dominance Right      Observation/Other Assessments   Focus on Therapeutic Outcomes (FOTO)  updated 40%      AROM   Left Hip Flexion 109   measured in supine   Lumbar Flexion mid shin, aberrant movement upon return (gower's sign)    Lumbar Extension 100% WFL    Lumbar - Right Side Bend lateral femoral epicondyle c no pain    Lumbar - Left Side Bend lateral femoral epicondyle c no pain      Strength   Right Hip  Flexion 5/5    Right Hip ABduction 5/5    Left Hip Flexion 3+/5    Left Hip ABduction 4+/5                           [facility] Adult PT Treatment/Exercise - 04/21/21 0001       Lumbar Exercises: Aerobic   Nustep Lvl 5 15 mins UE/LE      Lumbar Exercises: Supine   Other Supine Lumbar Exercises supine pball press down 10 sec x 10      Lumbar Exercises: Sidelying   Hip Abduction 15 reps;Both;2 seconds      Lumbar Exercises: Prone   Other Prone Lumbar Exercises prone opposite arm/leg lift 2-3 sec hold x 10 each                     PT Education - 04/21/21 0958     Education Details HEP progression, review of overall presentation    Person(s) Educated Patient    Methods Explanation;Demonstration;Verbal cues;Handout    Comprehension Verbalized understanding;Returned demonstration              PT Short Term Goals - 03/29/21 0944       PT SHORT TERM GOAL #1   Title Patient will demonstrate independent use of home exercise program to maintain progress from in clinic treatments.    Time 3    Period Weeks    Status Achieved    Target Date 04/02/21               PT Long Term Goals - 04/21/21 0935       PT LONG TERM GOAL #1   Title Patient will demonstrate/report pain at worst less than or equal to 2/10 to facilitate minimal limitation in daily activity secondary to pain symptoms.    Time 10    Period Weeks    Status On-going    Target Date 05/21/21      PT LONG TERM GOAL #2   Title Patient will demonstrate independent use of home exercise program to facilitate ability to maintain/progress functional gains from skilled physical therapy services.    Time 10    Period Weeks    Status On-going    Target Date 05/21/21      PT LONG TERM GOAL #3   Title Pt. will demonstrate FOTO outcome > or = 49% to indicated reduced disability due to condition.    Time 10    Period Weeks  Status On-going    Target Date 05/21/21      PT LONG TERM  GOAL #4   Title Pt. will demonstrate lumbar extension > or = 75% to facilitate upright standing/walking posture for home and work activity.    Time 10    Period Weeks    Status Achieved    Target Date 05/21/21      PT LONG TERM GOAL #5   Title Pt. will demonstrate Lt hip flexion MMT > or = 4/5 to facilitate transfers, standing, walking at home/work.    Time 10    Period Weeks    Status On-going    Target Date 05/21/21      PT LONG TERM GOAL #6   Title Pt. will demonstrate/report ability to walk/standing > 1 hr for work related tasks.    Time 10    Period Weeks    Status On-going    Target Date 05/21/21      PT LONG TERM GOAL #7   Title Pt. will demonstrate bilateral SLS > 10 seconds to facilitate stability in ambulation.    Time 10    Period Weeks    Status On-going    Target Date 05/21/21                   Plan - 04/21/21 0954     Clinical Impression Statement Pt has attended 10 visits overall during course of treamtent.  Pt. has reported mild reduction in severity of symptoms as observed to this point c moderate symptoms primarily vs. previous severe symptom level.  Global Rating of Change +5 indicated at this time.  See objective data for updated information.  Pt. has demonstrated improvements in lumbar mobility and Lt hip strength to this point.  Pt. demonstrated continued symptoms c impairments in strength/mobility that impact functional daily activity.  Recommended continued skilled PT services at this time c focus on maintaining gains in lumbar mobility, progressive core/hip strength for functional movement improvements.    Personal Factors and Comorbidities Other    Examination-Activity Limitations Sit;Sleep;Bed Mobility;Squat;Bend;Stairs;Carry;Stand;Transfers;Dressing;Hygiene/Grooming;Lift;Locomotion Level;Reach Overhead    Examination-Participation Restrictions Occupation;Meal Prep;Laundry;Interpersonal Relationship;Driving;Community Activity;Cleaning     Stability/Clinical Decision Making Evolving/Moderate complexity    Rehab Potential --   fair to good   PT Frequency 2x / week    PT Duration Other (comment)   10 weeks   PT Treatment/Interventions ADLs/Self Care Home Management;Cryotherapy;Electrical Stimulation;Iontophoresis 4mg /ml Dexamethasone;Moist Heat;Balance training;Traction;Therapeutic exercise;Therapeutic activities;Functional mobility training;Stair training;Gait training;Ultrasound;Neuromuscular re-education;Patient/family education;Passive range of motion;Spinal Manipulations;Joint Manipulations;Dry needling;Taping;Manual techniques    PT Next Visit Plan Core/hip strengthening progression, improve step/squat movement as able.    PT Home Exercise Plan [email or web address]    Consulted and Agree with Plan of Care Patient             Patient will benefit from skilled therapeutic intervention in order to improve the following deficits and impairments:  Abnormal gait, Decreased endurance, Hypomobility, Decreased activity tolerance, Decreased strength, Pain, Difficulty walking, Decreased mobility, Decreased balance, Decreased range of motion, Impaired perceived functional ability, Improper body mechanics, Postural dysfunction, Impaired flexibility, Decreased coordination  Visit Diagnosis: Chronic bilateral low back pain without sciatica  Muscle weakness (generalized)  Pain in left hip  Abnormal posture  Difficulty in walking, not elsewhere classified     Problem List Patient Active Problem List   Diagnosis Date Noted   Body mass index (BMI) 37.0-37.9, adult 03/14/2019   Lumbar stenosis without neurogenic claudication 03/14/2019   Hyperlipidemia 02/08/2019   Lumbar spondylosis 10/25/2018  Degeneration of lumbar intervertebral disc 10/01/2018   Surgical wound dehiscence 06/06/2018   Osteoarthritis of left hip 04/25/2018   Spinal stenosis of lumbar region 02/23/2018   Pain in left knee 01/10/2018   Trigger thumb of right hand  03/30/2017   Dyspnea 03/26/2013   Asthma vs VCD  02/28/2013   Hypertensive disorder 06/07/2012   Pyogenic granuloma 02/15/2012    Scot Jun, PT, DPT, OCS, ATC 04/21/21  10:08 AM    [facility] [address] [place], [place], [address] Phone: [phone]   Fax:  [phone]  Name: Darlene Crosby MRN: [id number] Date of Birth: [birth year]

## 2021-04-21 NOTE — Patient Instructions (Signed)
Access Code: [email or web address] URL: [email or web address] Date: 04/21/2021 Prepared by: Scot Jun  Exercises Supine Lower Trunk Rotation - 2 x daily - 7 x weekly - 1 sets - 5 reps - 15 hold Supine Single Knee to Chest Stretch - 2 x daily - 7 x weekly - 1 sets - 4-5 reps - 30 hold Supine Bridge - 2 x daily - 7 x weekly - 3 sets - 10 reps - 5 hold Eccentric Squat - 1-2 x daily - 7 x weekly - 3 sets - 10 reps Standing Lumbar Extension at [place] - 5 x daily - 7 x weekly - 1 sets - 5 reps - 3 seconds hold Standing Scapular Retraction - 5 x daily - 7 x weekly - 1 sets - 5 reps - 5 second hold Sidelying Hip Abduction - 2 x daily - 7 x weekly - 1-2 sets - 5 reps - 2 hold Seated March - 2 x daily - 7 x weekly - 1-2 sets - 10 reps Hooklying Isometric Hip Flexion - 2 x daily - 7 x weekly - 1 sets - 10 reps - 10 hold

## 2021-04-26 ENCOUNTER — Encounter: Payer: 59 | Admitting: Rehabilitative and Restorative Service Providers"

## 2021-04-28 ENCOUNTER — Other Ambulatory Visit: Payer: Self-pay

## 2021-04-28 ENCOUNTER — Encounter: Payer: Self-pay | Admitting: Rehabilitative and Restorative Service Providers"

## 2021-04-28 ENCOUNTER — Ambulatory Visit: Payer: 59 | Admitting: Rehabilitative and Restorative Service Providers"

## 2021-04-28 DIAGNOSIS — M545 Low back pain, unspecified: Secondary | ICD-10-CM | POA: Diagnosis not present

## 2021-04-28 DIAGNOSIS — M6281 Muscle weakness (generalized): Secondary | ICD-10-CM | POA: Diagnosis not present

## 2021-04-28 DIAGNOSIS — G8929 Other chronic pain: Secondary | ICD-10-CM

## 2021-04-28 DIAGNOSIS — M25552 Pain in left hip: Secondary | ICD-10-CM | POA: Diagnosis not present

## 2021-04-28 DIAGNOSIS — R293 Abnormal posture: Secondary | ICD-10-CM

## 2021-04-28 DIAGNOSIS — R262 Difficulty in walking, not elsewhere classified: Secondary | ICD-10-CM

## 2021-04-28 NOTE — Therapy (Signed)
[facility] [address] [place], [place], [address] Phone: [phone]   Fax:  [phone]  Physical Therapy Treatment  Patient Details  Name: Darlene Crosby MRN: [id number] Date of Birth: 1965-12-24 Referring Provider (PT): Jessy Oto, MD   Encounter Date: 04/28/2021   PT End of Session - 04/28/21 0940     Visit Number 11    Number of Visits 20    Date for PT Re-Evaluation 05/21/21    Progress Note Due on Visit 20    PT Start Time 0935    PT Stop Time 1014    PT Time Calculation (min) 39 min    Activity Tolerance Patient tolerated treatment well    Behavior During Therapy [facility] for tasks assessed/performed             Past Medical History:  Diagnosis Date   Asthma    followed by pcp   Hypertension    OA (osteoarthritis)    left knee, right shoulder, left hip   Seasonal allergies    Wears glasses     Past Surgical History:  Procedure Laterality Date   ANTERIOR HIP REVISION Left 06/06/2018   Procedure: LEFT HIP WOUND DEHISCENCE POSSIBLE HEAD AND LINER EXCHANGE;  Surgeon: Rod Can, MD;  Location: [facility];  Service: Orthopedics;  Laterality: Left;   COLONOSCOPY  2015   DIAGNOSTIC LAPAROSCOPY     tubal preg-took ovary and tube   LAPAROSCOPY FOR ECTOPIC PREGNANCY  1990s   LEFT SALPINGECTOMY AND RIGHT OOPHORECTOMY FOR ABNORMALITY   MASS EXCISION  02/15/2012   Procedure: EXCISION MASS;  Surgeon: Schuyler Amor, MD;  Location: [place];  Service: Orthopedics;  Laterality: Right;  Excision of Right Small Volar Mass   MYELOGRAM     TONSILLECTOMY  age 56   TOTAL HIP ARTHROPLASTY Left 04/25/2018   Procedure: TOTAL HIP ARTHROPLASTY ANTERIOR APPROACH;  Surgeon: Rod Can, MD;  Location: [facility];  Service: Orthopedics;  Laterality: Left;   TRIGGER FINGER RELEASE Right 2018   thumb   WISDOM TOOTH EXTRACTION  age 75    There were no vitals filed for this visit.   Subjective Assessment - 04/28/21  0938     Subjective Pt. indicated cancelling last time due to recent family funeral.  Pt indicated she was having a hard time dealing with that over the last few weeks.   Pt indicated pain today upon arrival about a 7/10.    Pertinent History Multiple lumbar DDD, mild lumbar spinal stenosis, left hip flexor weakness post left hip anterior THA 2020.   - seen Oct 2022 [address] for Rt ankle    Limitations Standing;Walking;House hold activities;Lifting;Sitting    Patient Stated Goals Reduce pain    Currently in Pain? Yes    Pain Score 7     Pain Location Back    Pain Orientation Lower    Pain Descriptors / Indicators Aching;Tightness    Pain Type Chronic pain    Pain Onset More than a month ago    Pain Frequency Constant    Aggravating Factors  giving bath to child    Pain Relieving Factors rest                [facility] PT Assessment - 04/28/21 0001       Assessment   Medical Diagnosis M48.062 (ICD-10-CM) - Spinal stenosis of lumbar region with neurogenic claudication  M47.816 (ICD-10-CM) - Lumbar spondylosis  M51.36 (ICD-10-CM) - Degeneration of lumbar intervertebral  disc  [id number] (ICD-10-CM) - History of total left hip replacement    Referring Provider (PT) Jessy Oto, MD    Onset Date/Surgical Date 02/28/18    Hand Dominance Right                           [facility] Adult PT Treatment/Exercise - 04/28/21 0001       Lumbar Exercises: Stretches   Lower Trunk Rotation 3 reps   15 seconds x 3 bilateral   Other Lumbar Stretch Exercise standing lumbar extension x 10      Lumbar Exercises: Aerobic   Nustep Lvl 5 12 mins UE/LE      Lumbar Exercises: Machines for Strengthening   Leg Press Double leg 75 lbs x 15, 37 lb 2 x 15 bilateral single leg      Lumbar Exercises: Supine   Bridge 10 reps    Other Supine Lumbar Exercises supine pball press down 10 sec x 10      Lumbar Exercises: Prone   Other Prone Lumbar Exercises prone opposite arm/leg lift 2-3 sec hold  x 10 each                       PT Short Term Goals - 03/29/21 0944       PT SHORT TERM GOAL #1   Title Patient will demonstrate independent use of home exercise program to maintain progress from in clinic treatments.    Time 3    Period Weeks    Status Achieved    Target Date 04/02/21               PT Long Term Goals - 04/21/21 0935       PT LONG TERM GOAL #1   Title Patient will demonstrate/report pain at worst less than or equal to 2/10 to facilitate minimal limitation in daily activity secondary to pain symptoms.    Time 10    Period Weeks    Status On-going    Target Date 05/21/21      PT LONG TERM GOAL #2   Title Patient will demonstrate independent use of home exercise program to facilitate ability to maintain/progress functional gains from skilled physical therapy services.    Time 10    Period Weeks    Status On-going    Target Date 05/21/21      PT LONG TERM GOAL #3   Title Pt. will demonstrate FOTO outcome > or = 49% to indicated reduced disability due to condition.    Time 10    Period Weeks    Status On-going    Target Date 05/21/21      PT LONG TERM GOAL #4   Title Pt. will demonstrate lumbar extension > or = 75% to facilitate upright standing/walking posture for home and work activity.    Time 10    Period Weeks    Status Achieved    Target Date 05/21/21      PT LONG TERM GOAL #5   Title Pt. will demonstrate Lt hip flexion MMT > or = 4/5 to facilitate transfers, standing, walking at home/work.    Time 10    Period Weeks    Status On-going    Target Date 05/21/21      PT LONG TERM GOAL #6   Title Pt. will demonstrate/report ability to walk/standing > 1 hr for work related tasks.    Time 10  Period Weeks    Status On-going    Target Date 05/21/21      PT LONG TERM GOAL #7   Title Pt. will demonstrate bilateral SLS > 10 seconds to facilitate stability in ambulation.    Time 10    Period Weeks    Status On-going     Target Date 05/21/21                   Plan - 04/28/21 1004     Clinical Impression Statement Continued focus on improving mobility and strength within lumbar and hip areas to promote improved progressive mobility tolerance and performance.  Pt. has continued to have reported difficulty due to recent death in family that has impacted her emotional and physical state in last few weeks.    Personal Factors and Comorbidities Other    Examination-Activity Limitations Sit;Sleep;Bed Mobility;Squat;Bend;Stairs;Carry;Stand;Transfers;Dressing;Hygiene/Grooming;Lift;Locomotion Level;Reach Overhead    Examination-Participation Restrictions Occupation;Meal Prep;Laundry;Interpersonal Relationship;Driving;Community Activity;Cleaning    Stability/Clinical Decision Making Evolving/Moderate complexity    Rehab Potential --   fair to good   PT Frequency 2x / week    PT Duration Other (comment)   10 weeks   PT Treatment/Interventions ADLs/Self Care Home Management;Cryotherapy;Electrical Stimulation;Iontophoresis 4mg /ml Dexamethasone;Moist Heat;Balance training;Traction;Therapeutic exercise;Therapeutic activities;Functional mobility training;Stair training;Gait training;Ultrasound;Neuromuscular re-education;Patient/family education;Passive range of motion;Spinal Manipulations;Joint Manipulations;Dry needling;Taping;Manual techniques    PT Next Visit Plan Lumbar and hip mobility gains, strengthening for improved activity tolerance.    PT Home Exercise Plan [email or web address]    Consulted and Agree with Plan of Care Patient             Patient will benefit from skilled therapeutic intervention in order to improve the following deficits and impairments:  Abnormal gait, Decreased endurance, Hypomobility, Decreased activity tolerance, Decreased strength, Pain, Difficulty walking, Decreased mobility, Decreased balance, Decreased range of motion, Impaired perceived functional ability, Improper body mechanics, Postural  dysfunction, Impaired flexibility, Decreased coordination  Visit Diagnosis: Chronic bilateral low back pain without sciatica  Muscle weakness (generalized)  Pain in left hip  Abnormal posture  Difficulty in walking, not elsewhere classified     Problem List Patient Active Problem List   Diagnosis Date Noted   Body mass index (BMI) 37.0-37.9, adult 03/14/2019   Lumbar stenosis without neurogenic claudication 03/14/2019   Hyperlipidemia 02/08/2019   Lumbar spondylosis 10/25/2018   Degeneration of lumbar intervertebral disc 10/01/2018   Surgical wound dehiscence 06/06/2018   Osteoarthritis of left hip 04/25/2018   Spinal stenosis of lumbar region 02/23/2018   Pain in left knee 01/10/2018   Trigger thumb of right hand 03/30/2017   Dyspnea 03/26/2013   Asthma vs VCD  02/28/2013   Hypertensive disorder 06/07/2012   Pyogenic granuloma 02/15/2012    Scot Jun, PT, DPT, OCS, ATC 04/28/21  10:15 AM    [facility] [address] [place], [place], [address] Phone: [phone]   Fax:  [phone]  Name: EVANELL REDLICH MRN: [id number] Date of Birth: May 06, 1965

## 2021-05-03 ENCOUNTER — Encounter: Payer: Self-pay | Admitting: Rehabilitative and Restorative Service Providers"

## 2021-05-03 ENCOUNTER — Other Ambulatory Visit: Payer: Self-pay

## 2021-05-03 ENCOUNTER — Ambulatory Visit (INDEPENDENT_AMBULATORY_CARE_PROVIDER_SITE_OTHER): Payer: 59 | Admitting: Rehabilitative and Restorative Service Providers"

## 2021-05-03 DIAGNOSIS — M25552 Pain in left hip: Secondary | ICD-10-CM

## 2021-05-03 DIAGNOSIS — R293 Abnormal posture: Secondary | ICD-10-CM | POA: Diagnosis not present

## 2021-05-03 DIAGNOSIS — M79604 Pain in right leg: Secondary | ICD-10-CM

## 2021-05-03 DIAGNOSIS — R2689 Other abnormalities of gait and mobility: Secondary | ICD-10-CM

## 2021-05-03 DIAGNOSIS — R262 Difficulty in walking, not elsewhere classified: Secondary | ICD-10-CM

## 2021-05-03 DIAGNOSIS — M545 Low back pain, unspecified: Secondary | ICD-10-CM

## 2021-05-03 DIAGNOSIS — M6281 Muscle weakness (generalized): Secondary | ICD-10-CM | POA: Diagnosis not present

## 2021-05-03 DIAGNOSIS — G8929 Other chronic pain: Secondary | ICD-10-CM

## 2021-05-03 NOTE — Patient Instructions (Signed)
Access Code: [email or web address] ?URL: [email or web address] ?Date: 05/03/2021 ?Prepared by: Scot Jun ? ?Exercises ?Supine Lower Trunk Rotation - 2 x daily - 7 x weekly - 1 sets - 5 reps - 15 hold ?Supine Single Knee to Chest Stretch - 2 x daily - 7 x weekly - 1 sets - 4-5 reps - 30 hold ?Supine Bridge - 2 x daily - 7 x weekly - 3 sets - 10 reps - 5 hold ?Standing Lumbar Extension at [place] 5 x daily - 7 x weekly - 1 sets - 5 reps - 3 seconds hold ?Sidelying Hip Abduction - 2 x daily - 7 x weekly - 1-2 sets - 5 reps - 2 hold ?Hooklying Isometric Hip Flexion - 2 x daily - 7 x weekly - 1 sets - 10 reps - 10 hold ?Prone Alternating Arm and Leg Lifts - 2 x daily - 7 x weekly - 1-2 sets - 10 reps ?Supine Dead Bug with Leg Extension - 2 x daily - 7 x weekly - 3 sets - 10 reps ? ?

## 2021-05-03 NOTE — Therapy (Signed)
[facility] [address] [place], [place], [address] Phone: [phone]   Fax:  [phone]  Physical Therapy Treatment  Patient Details  Name: Darlene Crosby MRN: [id number] Date of Birth: 26-Jan-1966 Referring Provider (PT): Jessy Oto, MD   Encounter Date: 05/03/2021   PT End of Session - 05/03/21 0933     Visit Number 12    Number of Visits 20    Date for PT Re-Evaluation 05/21/21    Progress Note Due on Visit 20    PT Start Time 0930    PT Stop Time 1009    PT Time Calculation (min) 39 min    Activity Tolerance Patient tolerated treatment well    Behavior During Therapy [facility] for tasks assessed/performed             Past Medical History:  Diagnosis Date   Asthma    followed by pcp   Hypertension    OA (osteoarthritis)    left knee, right shoulder, left hip   Seasonal allergies    Wears glasses     Past Surgical History:  Procedure Laterality Date   ANTERIOR HIP REVISION Left 06/06/2018   Procedure: LEFT HIP WOUND DEHISCENCE POSSIBLE HEAD AND LINER EXCHANGE;  Surgeon: Rod Can, MD;  Location: [facility];  Service: Orthopedics;  Laterality: Left;   COLONOSCOPY  2015   DIAGNOSTIC LAPAROSCOPY     tubal preg-took ovary and tube   LAPAROSCOPY FOR ECTOPIC PREGNANCY  1990s   LEFT SALPINGECTOMY AND RIGHT OOPHORECTOMY FOR ABNORMALITY   MASS EXCISION  02/15/2012   Procedure: EXCISION MASS;  Surgeon: Schuyler Amor, MD;  Location: [place];  Service: Orthopedics;  Laterality: Right;  Excision of Right Small Volar Mass   MYELOGRAM     TONSILLECTOMY  age 56   TOTAL HIP ARTHROPLASTY Left 04/25/2018   Procedure: TOTAL HIP ARTHROPLASTY ANTERIOR APPROACH;  Surgeon: Rod Can, MD;  Location: [facility];  Service: Orthopedics;  Laterality: Left;   TRIGGER FINGER RELEASE Right 2018   thumb   WISDOM TOOTH EXTRACTION  age 56    There were no vitals filed for this visit.   Subjective Assessment - 05/03/21  0932     Subjective Pt. indicated symptoms still at 7/10.  She indicated she did work and has been wearing brace.    Pertinent History Multiple lumbar DDD, mild lumbar spinal stenosis, left hip flexor weakness post left hip anterior THA 2020.   - seen Oct 2022 at [address] for Rt ankle    Limitations Standing;Walking;House hold activities;Lifting;Sitting    Patient Stated Goals Reduce pain    Currently in Pain? Yes    Pain Score 7     Pain Location Back    Pain Orientation Lower    Pain Descriptors / Indicators Aching;Tightness    Pain Onset More than a month ago    Pain Frequency Constant    Aggravating Factors  standing prolonged    Pain Relieving Factors resting                               [facility] Adult PT Treatment/Exercise - 05/03/21 0001       Lumbar Exercises: Stretches   Lower Trunk Rotation 3 reps   15 sec x 3 bilateral   Other Lumbar Stretch Exercise standing lumbar extension 2x5      Lumbar Exercises: Aerobic   Nustep Lvl 5 10 mins UE/LE  Lumbar Exercises: Seated   Sit to Stand 10 reps   18 inch table no UE     Lumbar Exercises: Supine   Other Supine Lumbar Exercises supine dying bug (full leg extension)  x 15 bilateral    Other Supine Lumbar Exercises supine pball press down 10 sec x 10      Lumbar Exercises: Prone   Other Prone Lumbar Exercises prone opposite arm/leg lift 2-3 sec hold x 10 each                     PT Education - 05/03/21 0949     Education Details HEP progression    Person(s) Educated Patient    Methods Explanation;Demonstration;Verbal cues;Handout    Comprehension Verbalized understanding;Returned demonstration              PT Short Term Goals - 03/29/21 0944       PT SHORT TERM GOAL #1   Title Patient will demonstrate independent use of home exercise program to maintain progress from in clinic treatments.    Time 3    Period Weeks    Status Achieved    Target Date 04/02/21                PT Long Term Goals - 04/21/21 0935       PT LONG TERM GOAL #1   Title Patient will demonstrate/report pain at worst less than or equal to 2/10 to facilitate minimal limitation in daily activity secondary to pain symptoms.    Time 10    Period Weeks    Status On-going    Target Date 05/21/21      PT LONG TERM GOAL #2   Title Patient will demonstrate independent use of home exercise program to facilitate ability to maintain/progress functional gains from skilled physical therapy services.    Time 10    Period Weeks    Status On-going    Target Date 05/21/21      PT LONG TERM GOAL #3   Title Pt. will demonstrate FOTO outcome > or = 49% to indicated reduced disability due to condition.    Time 10    Period Weeks    Status On-going    Target Date 05/21/21      PT LONG TERM GOAL #4   Title Pt. will demonstrate lumbar extension > or = 75% to facilitate upright standing/walking posture for home and work activity.    Time 10    Period Weeks    Status Achieved    Target Date 05/21/21      PT LONG TERM GOAL #5   Title Pt. will demonstrate Lt hip flexion MMT > or = 4/5 to facilitate transfers, standing, walking at home/work.    Time 10    Period Weeks    Status On-going    Target Date 05/21/21      PT LONG TERM GOAL #6   Title Pt. will demonstrate/report ability to walk/standing > 1 hr for work related tasks.    Time 10    Period Weeks    Status On-going    Target Date 05/21/21      PT LONG TERM GOAL #7   Title Pt. will demonstrate bilateral SLS > 10 seconds to facilitate stability in ambulation.    Time 10    Period Weeks    Status On-going    Target Date 05/21/21  Plan - 05/03/21 0949     Clinical Impression Statement Lt hip flexion strength deficits and fatigue noted in several interventions today requiring adaptation and additional rest periods.    Personal Factors and Comorbidities Other    Examination-Activity Limitations  Sit;Sleep;Bed Mobility;Squat;Bend;Stairs;Carry;Stand;Transfers;Dressing;Hygiene/Grooming;Lift;Locomotion Level;Reach Overhead    Examination-Participation Restrictions Occupation;Meal Prep;Laundry;Interpersonal Relationship;Driving;Community Activity;Cleaning    Stability/Clinical Decision Making Evolving/Moderate complexity    Rehab Potential --   fair to good   PT Frequency 2x / week    PT Duration Other (comment)   10 weeks   PT Treatment/Interventions ADLs/Self Care Home Management;Cryotherapy;Electrical Stimulation;Iontophoresis '4mg'$ /ml Dexamethasone;Moist Heat;Balance training;Traction;Therapeutic exercise;Therapeutic activities;Functional mobility training;Stair training;Gait training;Ultrasound;Neuromuscular re-education;Patient/family education;Passive range of motion;Spinal Manipulations;Joint Manipulations;Dry needling;Taping;Manual techniques    PT Next Visit Plan Core and hip strengthening, lumbar mobility gains as necessary for symptom relief.    PT Home Exercise Plan [id number]    Consulted and Agree with Plan of Care Patient             Patient will benefit from skilled therapeutic intervention in order to improve the following deficits and impairments:  Abnormal gait, Decreased endurance, Hypomobility, Decreased activity tolerance, Decreased strength, Pain, Difficulty walking, Decreased mobility, Decreased balance, Decreased range of motion, Impaired perceived functional ability, Improper body mechanics, Postural dysfunction, Impaired flexibility, Decreased coordination  Visit Diagnosis: Chronic bilateral low back pain without sciatica  Muscle weakness (generalized)  Pain in left hip  Abnormal posture  Difficulty in walking, not elsewhere classified  Pain in right leg  Other abnormalities of gait and mobility     Problem List Patient Active Problem List   Diagnosis Date Noted   Body mass index (BMI) 37.0-37.9, adult 03/14/2019   Lumbar stenosis without  neurogenic claudication 03/14/2019   Hyperlipidemia 02/08/2019   Lumbar spondylosis 10/25/2018   Degeneration of lumbar intervertebral disc 10/01/2018   Surgical wound dehiscence 06/06/2018   Osteoarthritis of left hip 04/25/2018   Spinal stenosis of lumbar region 02/23/2018   Pain in left knee 01/10/2018   Trigger thumb of right hand 03/30/2017   Dyspnea 03/26/2013   Asthma vs VCD  02/28/2013   Hypertensive disorder 06/07/2012   Pyogenic granuloma 02/15/2012    Scot Jun, PT, DPT, OCS, ATC 05/03/21  10:01 AM    [facility] [address] [place], [place], [address] Phone: [phone]   Fax:  [phone]  Name: Darlene Crosby MRN: [id number] Date of Birth: 1965-07-22

## 2021-05-05 ENCOUNTER — Ambulatory Visit: Payer: 59 | Admitting: Rehabilitative and Restorative Service Providers"

## 2021-05-05 ENCOUNTER — Other Ambulatory Visit: Payer: Self-pay

## 2021-05-05 ENCOUNTER — Encounter: Payer: Self-pay | Admitting: Rehabilitative and Restorative Service Providers"

## 2021-05-05 DIAGNOSIS — M6281 Muscle weakness (generalized): Secondary | ICD-10-CM

## 2021-05-05 DIAGNOSIS — R293 Abnormal posture: Secondary | ICD-10-CM

## 2021-05-05 DIAGNOSIS — M545 Low back pain, unspecified: Secondary | ICD-10-CM

## 2021-05-05 DIAGNOSIS — R262 Difficulty in walking, not elsewhere classified: Secondary | ICD-10-CM

## 2021-05-05 DIAGNOSIS — M25552 Pain in left hip: Secondary | ICD-10-CM | POA: Diagnosis not present

## 2021-05-05 DIAGNOSIS — G8929 Other chronic pain: Secondary | ICD-10-CM

## 2021-05-05 NOTE — Therapy (Signed)
[facility] [address] [place], [place], [address] Phone: [phone]   Fax:  [phone]  Physical Therapy Treatment  Patient Details  Name: Darlene Crosby MRN: [id number] Date of Birth: [birth year] Referring Provider (PT): Jessy Oto, MD   Encounter Date: 05/05/2021   PT End of Session - 05/05/21 0942     Visit Number 13    Number of Visits 20    Date for PT Re-Evaluation 05/21/21    Progress Note Due on Visit 20    PT Start Time 0934    PT Stop Time 1013    PT Time Calculation (min) 39 min    Activity Tolerance Patient tolerated treatment well    Behavior During Therapy [facility] for tasks assessed/performed             Past Medical History:  Diagnosis Date   Asthma    followed by pcp   Hypertension    OA (osteoarthritis)    left knee, right shoulder, left hip   Seasonal allergies    Wears glasses     Past Surgical History:  Procedure Laterality Date   ANTERIOR HIP REVISION Left 06/06/2018   Procedure: LEFT HIP WOUND DEHISCENCE POSSIBLE HEAD AND LINER EXCHANGE;  Surgeon: Rod Can, MD;  Location: [facility];  Service: Orthopedics;  Laterality: Left;   COLONOSCOPY  2015   DIAGNOSTIC LAPAROSCOPY     tubal preg-took ovary and tube   LAPAROSCOPY FOR ECTOPIC PREGNANCY  1990s   LEFT SALPINGECTOMY AND RIGHT OOPHORECTOMY FOR ABNORMALITY   MASS EXCISION  02/15/2012   Procedure: EXCISION MASS;  Surgeon: Schuyler Amor, MD;  Location: [place];  Service: Orthopedics;  Laterality: Right;  Excision of Right Small Volar Mass   MYELOGRAM     TONSILLECTOMY  age 10   TOTAL HIP ARTHROPLASTY Left 04/25/2018   Procedure: TOTAL HIP ARTHROPLASTY ANTERIOR APPROACH;  Surgeon: Rod Can, MD;  Location: [facility];  Service: Orthopedics;  Laterality: Left;   TRIGGER FINGER RELEASE Right 2018   thumb   WISDOM TOOTH EXTRACTION  age 37    There were no vitals filed for this visit.   Subjective Assessment - 05/05/21  0938     Subjective Pt. reported feeling increased stiffness and limited movement from Lt leg today.  Pt. stated pain in back about 8/10.  Somewhat better +3 reported.    Pertinent History Multiple lumbar DDD, mild lumbar spinal stenosis, left hip flexor weakness post left hip anterior THA 2020.   - seen Oct 2022 [address] for Rt ankle    Limitations Standing;Walking;House hold activities;Lifting;Sitting    Patient Stated Goals Reduce pain    Pain Score 8     Pain Location Back    Pain Orientation Lower    Pain Descriptors / Indicators Aching    Pain Onset More than a month ago    Pain Frequency Constant    Aggravating Factors  housework    Pain Relieving Factors rest                [facility] PT Assessment - 05/05/21 0001       Assessment   Medical Diagnosis M48.062 (ICD-10-CM) - Spinal stenosis of lumbar region with neurogenic claudication  M47.816 (ICD-10-CM) - Lumbar spondylosis  M51.36 (ICD-10-CM) - Degeneration of lumbar intervertebral disc  Z96.642 (ICD-10-CM) - History of total left hip replacement    Referring Provider (PT) Jessy Oto, MD    Onset Date/Surgical Date 02/28/18  Hand Dominance Right      AROM   Lumbar Extension 100% WFL, continued reduced symptoms noted c use    Lumbar - Right Side Bend lateral femoral epicondyle c no pain    Lumbar - Left Side Bend lateral femoral epicondyle c no pain      Strength   Right Hip Flexion 5/5    Right Hip ABduction 5/5    Left Hip Flexion 3+/5    Left Hip ABduction 4+/5                           [facility] Adult PT Treatment/Exercise - 05/05/21 0001       Exercises   Other Exercises  HEP review for trial HEP period      Lumbar Exercises: Stretches   Other Lumbar Stretch Exercise SKC Lt 15 sec x 5      Lumbar Exercises: Aerobic   Nustep Lvl 6 10 mins UE/LE      Lumbar Exercises: Machines for Strengthening   Leg Press Single leg 2x 15 37 lbs, performed bilateral      Lumbar Exercises: Supine    Bridge 15 reps    Other Supine Lumbar Exercises supine marching 2 x 10, isometric flexion hold Lt and Rt hip 5sec x 10    Other Supine Lumbar Exercises supine pball press down 10 sec x 10                       PT Short Term Goals - 03/29/21 0944       PT SHORT TERM GOAL #1   Title Patient will demonstrate independent use of home exercise program to maintain progress from in clinic treatments.    Time 3    Period Weeks    Status Achieved    Target Date 04/02/21               PT Long Term Goals - 05/05/21 0955       PT LONG TERM GOAL #1   Title Patient will demonstrate/report pain at worst less than or equal to 2/10 to facilitate minimal limitation in daily activity secondary to pain symptoms.    Time 10    Period Weeks    Status On-going    Target Date 05/21/21      PT LONG TERM GOAL #2   Title Patient will demonstrate independent use of home exercise program to facilitate ability to maintain/progress functional gains from skilled physical therapy services.    Time 10    Period Weeks    Status Achieved    Target Date 05/21/21      PT LONG TERM GOAL #3   Title Pt. will demonstrate FOTO outcome > or = 49% to indicated reduced disability due to condition.    Time 10    Period Weeks    Status On-going    Target Date 05/21/21      PT LONG TERM GOAL #4   Title Pt. will demonstrate lumbar extension > or = 75% to facilitate upright standing/walking posture for home and work activity.    Time 10    Period Weeks    Status Achieved    Target Date 05/21/21      PT LONG TERM GOAL #5   Title Pt. will demonstrate Lt hip flexion MMT > or = 4/5 to facilitate transfers, standing, walking at home/work.    Time 10    Period Weeks  Status On-going    Target Date 05/21/21      PT LONG TERM GOAL #6   Title Pt. will demonstrate/report ability to walk/standing > 1 hr for work related tasks.    Time 10    Period Weeks    Status On-going    Target Date 05/21/21       PT LONG TERM GOAL #7   Title Pt. will demonstrate bilateral SLS > 10 seconds to facilitate stability in ambulation.    Time 10    Period Weeks    Status On-going    Target Date 05/21/21                   Plan - 05/05/21 0943     Clinical Impression Statement Pt. has attended 13 visits overall during course of treatment with slow but steady overall objective mobility and strength improvements noted in treatment time.  Global Rating of change +3 somewhat better. Continued complaints of back pain noted as reported with some reduction in severity and a noted improvement c use of HEP for short term relief in difficult moments.  Pt. may be appropriate at this time for a trial transition to HEP for continued work on mobility and strength gains.  Pt. was in agreement with plan.  HEP was reviewed.    Personal Factors and Comorbidities Other    Examination-Activity Limitations Sit;Sleep;Bed Mobility;Squat;Bend;Stairs;Carry;Stand;Transfers;Dressing;Hygiene/Grooming;Lift;Locomotion Level;Reach Overhead    Examination-Participation Restrictions Occupation;Meal Prep;Laundry;Interpersonal Relationship;Driving;Community Activity;Cleaning    Stability/Clinical Decision Making Evolving/Moderate complexity    Rehab Potential --   fair to good   PT Frequency 2x / week    PT Duration Other (comment)   10 weeks   PT Treatment/Interventions ADLs/Self Care Home Management;Cryotherapy;Electrical Stimulation;Iontophoresis '4mg'$ /ml Dexamethasone;Moist Heat;Balance training;Traction;Therapeutic exercise;Therapeutic activities;Functional mobility training;Stair training;Gait training;Ultrasound;Neuromuscular re-education;Patient/family education;Passive range of motion;Spinal Manipulations;Joint Manipulations;Dry needling;Taping;Manual techniques    PT Next Visit Plan Hold PT for trial HEP due to stablized condition.  recert to be required if return.    PT Home Exercise Plan [id number]    Consulted and Agree  with Plan of Care Patient             Patient will benefit from skilled therapeutic intervention in order to improve the following deficits and impairments:  Abnormal gait, Decreased endurance, Hypomobility, Decreased activity tolerance, Decreased strength, Pain, Difficulty walking, Decreased mobility, Decreased balance, Decreased range of motion, Impaired perceived functional ability, Improper body mechanics, Postural dysfunction, Impaired flexibility, Decreased coordination  Visit Diagnosis: Chronic bilateral low back pain without sciatica  Muscle weakness (generalized)  Pain in left hip  Abnormal posture  Difficulty in walking, not elsewhere classified     Problem List Patient Active Problem List   Diagnosis Date Noted   Body mass index (BMI) 37.0-37.9, adult 03/14/2019   Lumbar stenosis without neurogenic claudication 03/14/2019   Hyperlipidemia 02/08/2019   Lumbar spondylosis 10/25/2018   Degeneration of lumbar intervertebral disc 10/01/2018   Surgical wound dehiscence 06/06/2018   Osteoarthritis of left hip 04/25/2018   Spinal stenosis of lumbar region 02/23/2018   Pain in left knee 01/10/2018   Trigger thumb of right hand 03/30/2017   Dyspnea 03/26/2013   Asthma vs VCD  02/28/2013   Hypertensive disorder 06/07/2012   Pyogenic granuloma 02/15/2012   Scot Jun, PT, DPT, OCS, ATC 05/05/21  10:07 AM    [facility] [address] [place], [place], [address] Phone: [phone]   Fax:  [phone]  Name: Darlene Crosby MRN: [id number] Date of Birth:  12/11/1965 ° ° ° °

## 2021-05-19 NOTE — Progress Notes (Signed)
? ? ?Patient ID: Darlene Crosby, female    DOB: 05/28/1965  MRN: [id number] ? ?CC: Hypertension Follow-Up  ? ?Subjective: ?Darlene Crosby is a 56 y.o. female who presents for hypertension follow-up.  ? ?Her concerns today include:  ?HYPERTENSION FOLLOW-UP: ?02/23/2021: ?- Continue Amlodipine as prescribed. ? ?05/24/2021: ?Doing well on current regimen. No side effects. No issues/concerns. Denies chest pain and shortness of breath.  ? ?2. HYPERLIPIDEMIA FOLLOW-UP: ?Doing well on Rosuvastatin, no issues/concerns. ? ?3. ALLERGIES: ?Requesting Xyzal and Tessalon Perles refills.  ? ?Patient Active Problem List  ? Diagnosis Date Noted  ? Body mass index (BMI) 37.0-37.9, adult 03/14/2019  ? Lumbar stenosis without neurogenic claudication 03/14/2019  ? Hyperlipidemia 02/08/2019  ? Lumbar spondylosis 10/25/2018  ? Degeneration of lumbar intervertebral disc 10/01/2018  ? Surgical wound dehiscence 06/06/2018  ? Osteoarthritis of left hip 04/25/2018  ? Spinal stenosis of lumbar region 02/23/2018  ? Pain in left knee 01/10/2018  ? Trigger thumb of right hand 03/30/2017  ? Dyspnea 03/26/2013  ? Asthma vs VCD  02/28/2013  ? Hypertensive disorder 06/07/2012  ? Pyogenic granuloma 02/15/2012  ?  ? ?Current Outpatient Medications on File Prior to Visit  ?Medication Sig Dispense Refill  ? albuterol (PROVENTIL) (2.5 MG/3ML) 0.083% nebulizer solution Inhale 2.5 mg into the lungs every 4 (four) hours as needed for wheezing or shortness of breath.     ? albuterol (VENTOLIN HFA) 108 (90 Base) MCG/ACT inhaler Inhale 2 puffs into the lungs every 4 (four) hours as needed (wheezing and SOB).     ? cyclobenzaprine (FLEXERIL) 10 MG tablet Take 10 mg by mouth daily as needed.    ? diclofenac (VOLTAREN) 50 MG EC tablet Take 1 tablet (50 mg total) by mouth 3 (three) times daily. 90 tablet 2  ? Multiple Vitamin (MULTIVITAMIN) tablet Take 1 tablet by mouth daily.    ? pregabalin (LYRICA) 50 MG capsule Take 1 capsule (50 mg total) by mouth daily. 30  capsule 2  ? rosuvastatin (CRESTOR) 20 MG tablet Take 1 tablet (20 mg total) by mouth daily. 180 tablet 0  ? ?No current facility-administered medications on file prior to visit.  ? ? ?Allergies  ?Allergen Reactions  ? Pineapple Shortness Of Breath, Swelling and Hives  ?  Swelling of tongue   ? Chocolate Hives  ? Coconut Oil Hives and Rash  ? Strawberry Extract Hives  ? Wheat Bran Hives  ? ? ?Social History  ? ?Socioeconomic History  ? Marital status: Widowed  ?  Spouse name: Not on file  ? Number of children: Not on file  ? Years of education: Not on file  ? Highest education level: Not on file  ?Occupational History  ? Not on file  ?Tobacco Use  ? Smoking status: Never  ?  Passive exposure: Never  ? Smokeless tobacco: Never  ?Vaping Use  ? Vaping Use: Never used  ?Substance and Sexual Activity  ? Alcohol use: No  ? Drug use: Never  ? Sexual activity: Not on file  ?Other Topics Concern  ? Not on file  ?Social History Narrative  ? Not on file  ? ?Social Determinants of Health  ? ?Financial Resource Strain: Not on file  ?Food Insecurity: Not on file  ?Transportation Needs: Not on file  ?Physical Activity: Not on file  ?Stress: Not on file  ?Social Connections: Not on file  ?Intimate Partner Violence: Not on file  ? ? ?Family History  ?Problem Relation Age of Onset  ? Hypertension  Mother   ? Asthma Mother   ? Allergies Mother   ? Breast cancer Sister   ? Heart disease Sister   ? Hypertension Sister   ? Stroke Sister   ? Diabetes Sister   ? Colon cancer Neg Hx   ? Esophageal cancer Neg Hx   ? Stomach cancer Neg Hx   ? Pancreatic cancer Neg Hx   ? ? ?Past Surgical History:  ?Procedure Laterality Date  ? ANTERIOR HIP REVISION Left 06/06/2018  ? Procedure: LEFT HIP WOUND DEHISCENCE POSSIBLE HEAD AND LINER EXCHANGE;  Surgeon: Rod Can, MD;  Location: [facility];  Service: Orthopedics;  Laterality: Left;  ? COLONOSCOPY  2015  ? DIAGNOSTIC LAPAROSCOPY    ? tubal preg-took ovary and tube  ? LAPAROSCOPY FOR ECTOPIC  PREGNANCY  1990s  ? LEFT SALPINGECTOMY AND RIGHT OOPHORECTOMY FOR ABNORMALITY  ? MASS EXCISION  02/15/2012  ? Procedure: EXCISION MASS;  Surgeon: Schuyler Amor, MD;  Location: [facility];  Service: Orthopedics;  Laterality: Right;  Excision of Right Small Volar Mass  ? MYELOGRAM    ? TONSILLECTOMY  age 23  ? TOTAL HIP ARTHROPLASTY Left 04/25/2018  ? Procedure: TOTAL HIP ARTHROPLASTY ANTERIOR APPROACH;  Surgeon: Rod Can, MD;  Location: [facility];  Service: Orthopedics;  Laterality: Left;  ? TRIGGER FINGER RELEASE Right 2018  ? thumb  ? WISDOM TOOTH EXTRACTION  age 23  ? ? ?ROS: ?Review of Systems ?Negative except as stated above ? ?PHYSICAL EXAM: ?BP 140/85 (BP Location: Left Arm, Patient Position: Sitting, Cuff Size: Large)   Pulse 74   Temp 98.3 ?F (36.8 ?C)   Resp 18   Ht 5' 5.2" (1.656 m)   Wt 229 lb (103.9 kg)   LMP 05/04/2012   SpO2 98%   BMI 37.88 kg/m?  ? ?Physical Exam ?HENT:  ?   Head: Normocephalic and atraumatic.  ?Eyes:  ?   Extraocular Movements: Extraocular movements intact.  ?   Conjunctiva/sclera: Conjunctivae normal.  ?   Pupils: Pupils are equal, round, and reactive to light.  ?Cardiovascular:  ?   Rate and Rhythm: Normal rate and regular rhythm.  ?   Pulses: Normal pulses.  ?   Heart sounds: Normal heart sounds.  ?Pulmonary:  ?   Effort: Pulmonary effort is normal.  ?   Breath sounds: Normal breath sounds.  ?Musculoskeletal:  ?   Cervical back: Normal range of motion and neck supple.  ?Neurological:  ?   General: No focal deficit present.  ?   Mental Status: She is alert and oriented to person, place, and time.  ?Psychiatric:     ?   Mood and Affect: Mood normal.     ?   Behavior: Behavior normal.  ? ?ASSESSMENT AND PLAN: ?1. Essential (primary) hypertension: ?- Continue Amlodipine as prescribed.  ?- Counseled on blood pressure goal of less than 130/80, low-sodium, DASH diet, medication compliance, and 150 minutes of moderate intensity exercise per week as  tolerated. Counseled on medication adherence and adverse effects. ?- Follow-up with primary provider in 4 months or sooner if needed.  ?- amLODipine (NORVASC) 5 MG tablet; Take 1 tablet (5 mg total) by mouth daily.  Dispense: 120 tablet; Refill: 0 ? ?2. Hyperlipidemia, unspecified hyperlipidemia type: ?- Continue Rosuvastatin as prescribed. No refills needed as of present.  ?- Update lipid panel.  ?- Lipid Panel ? ?3. Perennial allergic rhinitis: ?- Continue Levocetirizine and Benzonatate capsules as prescribed.  ?- Follow-up with primary provider as  scheduled.  ?- levocetirizine (XYZAL) 5 MG tablet; Take 1 tablet (5 mg total) by mouth every evening.  Dispense: 30 tablet; Refill: 5 ?- benzonatate (TESSALON) 100 MG capsule; Take 1 capsule (100 mg total) by mouth 3 (three) times daily as needed for cough.  Dispense: 20 capsule; Refill: 2 ? ? ? ?Patient was given the opportunity to ask questions.  Patient verbalized understanding of the plan and was able to repeat key elements of the plan. Patient was given clear instructions to go to Emergency Department or return to medical center if symptoms don't improve, worsen, or new problems develop.The patient verbalized understanding. ? ? ?Orders Placed This Encounter  ?Procedures  ? Lipid Panel  ? ? ? ?Requested Prescriptions  ? ?Signed Prescriptions Disp Refills  ? levocetirizine (XYZAL) 5 MG tablet 30 tablet 5  ?  Sig: Take 1 tablet (5 mg total) by mouth every evening.  ? benzonatate (TESSALON) 100 MG capsule 20 capsule 2  ?  Sig: Take 1 capsule (100 mg total) by mouth 3 (three) times daily as needed for cough.  ? amLODipine (NORVASC) 5 MG tablet 120 tablet 0  ?  Sig: Take 1 tablet (5 mg total) by mouth daily.  ? ? ?Return in about 4 months (around 09/23/2021) for Follow-Up or next available hypertension . ? ?Camillia Herter, NP  ?

## 2021-05-24 ENCOUNTER — Ambulatory Visit (INDEPENDENT_AMBULATORY_CARE_PROVIDER_SITE_OTHER): Payer: 59 | Admitting: Family

## 2021-05-24 ENCOUNTER — Other Ambulatory Visit: Payer: Self-pay

## 2021-05-24 ENCOUNTER — Encounter: Payer: Self-pay | Admitting: Family

## 2021-05-24 VITALS — BP 140/85 | HR 74 | Temp 98.3°F | Resp 18 | Ht 65.2 in | Wt 229.0 lb

## 2021-05-24 DIAGNOSIS — I10 Essential (primary) hypertension: Secondary | ICD-10-CM | POA: Diagnosis not present

## 2021-05-24 DIAGNOSIS — J3089 Other allergic rhinitis: Secondary | ICD-10-CM | POA: Diagnosis not present

## 2021-05-24 DIAGNOSIS — E785 Hyperlipidemia, unspecified: Secondary | ICD-10-CM | POA: Diagnosis not present

## 2021-05-24 MED ORDER — BENZONATATE 100 MG PO CAPS
100.0000 mg | ORAL_CAPSULE | Freq: Three times a day (TID) | ORAL | 2 refills | Status: DC | PRN
Start: 2021-05-24 — End: 2022-04-12

## 2021-05-24 MED ORDER — LEVOCETIRIZINE DIHYDROCHLORIDE 5 MG PO TABS: 5.0000 mg | ORAL_TABLET | Freq: Every evening | ORAL | 5 refills | Status: DC

## 2021-05-24 MED ORDER — AMLODIPINE BESYLATE 5 MG PO TABS: 5.0000 mg | ORAL_TABLET | Freq: Every day | ORAL | 0 refills | Status: DC

## 2021-05-24 NOTE — Progress Notes (Signed)
Pt presents for hypertension follow-up, needs refill on Xyzal and wants a Rx for Tessalon Perles  ?

## 2021-06-07 ENCOUNTER — Telehealth: Payer: Self-pay | Admitting: Specialist

## 2021-06-07 ENCOUNTER — Ambulatory Visit (INDEPENDENT_AMBULATORY_CARE_PROVIDER_SITE_OTHER): Payer: 59 | Admitting: Specialist

## 2021-06-07 ENCOUNTER — Encounter: Payer: Self-pay | Admitting: Specialist

## 2021-06-07 VITALS — BP 169/93 | HR 80 | Ht 65.0 in | Wt 229.0 lb

## 2021-06-07 DIAGNOSIS — Q7649 Other congenital malformations of spine, not associated with scoliosis: Secondary | ICD-10-CM

## 2021-06-07 DIAGNOSIS — M47816 Spondylosis without myelopathy or radiculopathy, lumbar region: Secondary | ICD-10-CM

## 2021-06-07 DIAGNOSIS — R29898 Other symptoms and signs involving the musculoskeletal system: Secondary | ICD-10-CM

## 2021-06-07 DIAGNOSIS — M5136 Other intervertebral disc degeneration, lumbar region: Secondary | ICD-10-CM | POA: Diagnosis not present

## 2021-06-07 DIAGNOSIS — M51369 Other intervertebral disc degeneration, lumbar region without mention of lumbar back pain or lower extremity pain: Secondary | ICD-10-CM

## 2021-06-07 DIAGNOSIS — Z96642 Presence of left artificial hip joint: Secondary | ICD-10-CM

## 2021-06-07 DIAGNOSIS — M48062 Spinal stenosis, lumbar region with neurogenic claudication: Secondary | ICD-10-CM

## 2021-06-07 NOTE — Progress Notes (Signed)
? ?Office Visit Note ?  ?Patient: Darlene Crosby           ?Date of Birth: 1965/11/11           ?MRN: [id number] ?Visit Date: 06/07/2021 ?             ?Requested by: Camillia Herter, NP ?[facility] ?[address] ?[place],  [place] [address] ?PCP: Camillia Herter, NP ? ? ?Assessment & Plan: ?Visit Diagnoses:  ?1. Spinal stenosis of lumbar region with neurogenic claudication   ?2. 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 Degeneration of lumbar intervertebral disc   ?5. Weakness of left hip   ?6. History of total left hip replacement   ?7. Spondylosis without myelopathy or radiculopathy, lumbar region   ? ? ?Plan: Avoid bending, stooping and avoid lifting weights greater than 10 lbs. ?Avoid prolong standing and walking. ?Avoid frequent bending and stooping  ?No lifting greater than 10 lbs. ?May use ice or moist heat for pain. ?Weight loss is of benefit. ?Handicap license is approved. ?Avoid bending, stooping and avoid lifting weights greater than 10 lbs. ?Avoid prolong standing and walking. ?Order for a new walker with wheels. ?Surgery scheduling secretary Kandice Hams, will call you in the next week to schedule for surgery.  ?Surgery recommended is a two level lumbar decompression L3-4 and L4-5. ?Take hydrocodone for for pain. ?Risk of surgery includes risk of infection 1 in 300 patients, bleeding less than 1/2% chance you would need a transfusion.   Risk to the nerves is one in 10,000. ? ?Expect improved walking and standing tolerance. Expect relief of leg pain. ?This will not improve the weakness in hip flexion strength of the left hip as I believe this is not due to the spine but chrone in ability to move the hip prior to THR caused long term weakness. Numbness may persist depending on the length and degree of pressure that has been present. ? ? ?Follow-Up Instructions: No follow-ups on file.  ? ?Orders:  ?No orders of the defined types were placed in this encounter. ? ?No orders  of the defined types were placed in this encounter. ? ? ? ? Procedures: ?No procedures performed ? ? ?Clinical Data: ?Findings:  ?Narrative & Impression ?CLINICAL DATA:  Chronic central low back pain radiating upwards into ?the right shoulder. No prior surgery. ?  ?EXAM: ?LUMBAR MYELOGRAM ?  ?CT LUMBAR MYELOGRAM ?  ?FLUOROSCOPY TIME:  Radiation Exposure Index (as provided by the ?fluoroscopic device): 29.5 mGy ?  ?Fluoroscopy Time:  1 minute, 18 seconds ?  ?Number of Acquired Images:  16 ?  ?PROCEDURE: ?After thorough discussion of risks and benefits of the procedure ?including bleeding, infection, injury to nerves, blood vessels, ?adjacent structures as well as headache and CSF leak, written and ?oral informed consent was obtained. Consent was obtained by Dr. ?Fabiola Backer. Time out form was completed. ?  ?Patient was positioned prone on the fluoroscopy table. Local ?anesthesia was provided with 1% lidocaine without epinephrine after ?prepped and draped in the usual sterile fashion. Puncture was ?performed at L2-L3 using a 5 inch 22-gauge spinal needle via right ?interlaminar approach. Using a single pass through the dura, the ?needle was placed within the thecal sac, with return of clear CSF. ?15 mL of Isovue M-200 was injected into the thecal sac, with normal ?opacification of the nerve roots and cauda equina consistent with ?free flow within the subarachnoid space. ?  ?I personally performed  the lumbar puncture and administered the ?intrathecal contrast. I also personally supervised acquisition of ?the myelogram images. ?  ?TECHNIQUE: ?Contiguous axial images were obtained through the lumbar spine after ?the intrathecal infusion of contrast. Coronal and sagittal ?reconstructions were obtained of the axial image sets. ?  ?COMPARISON:  Outside MRI lumbar spine dated February 18, 2019. ?  ?FINDINGS: ?LUMBAR MYELOGRAM FINDINGS: ?  ?Trace retrolisthesis at L1-L2. No dynamic instability. Small ventral ?extradural  defects from L1-L2 through L4-L5. These slightly worsened ?with extension. Mild spinal canal stenosis at L2-L3 and L3-L4. No ?nerve root effacement. ?  ?CT LUMBAR MYELOGRAM FINDINGS: ?  ?Segmentation: Standard. ?  ?Alignment: Unchanged trace retrolisthesis at L1-L2. ?  ?Vertebrae: No acute fracture or other focal pathologic process. ?  ?Conus medullaris and cauda equina: Conus extends to the L1 level. ?Conus and cauda equina appear normal. ?  ?Paraspinal and other soft tissues: Negative. Incidental note is made ?of a retroaortic left renal vein. ?  ?Disc levels: ?  ?T12-L1:  Unchanged mild disc bulging.  No stenosis. ?  ?L1-L2: Unchanged mild disc bulging and bilateral facet arthropathy. ?Unchanged mild right lateral recess stenosis. Unchanged moderate ?right and mild left neuroforaminal stenosis. No spinal canal ?stenosis. ?  ?L2-L3: Unchanged mild disc bulging and mild bilateral facet ?arthropathy. Unchanged mild spinal canal stenosis and borderline ?mild bilateral neuroforaminal stenosis. ?  ?L3-L4: Unchanged mild disc bulging eccentric to the right. Unchanged ?mild bilateral facet arthropathy. Unchanged mild to moderate spinal ?canal stenosis and moderate bilateral neuroforaminal stenosis. ?  ?L4-L5: Unchanged mild disc bulging and mild bilateral facet ?arthropathy. Unchanged moderate bilateral neuroforaminal stenosis. ?No spinal canal stenosis. ?  ?L5-S1: Negative disc. Unchanged mild bilateral facet arthropathy. No ?stenosis. ?  ?IMPRESSION: ?1. Multilevel lumbar spondylosis as described above. Mild L2-L3 and ?mild to moderate L3-L4 spinal canal stenosis. ?2. Moderate bilateral neuroforaminal stenosis at L3-L4 and L4-L5. ?3. Disc bulging slightly worsens with extension. ?  ?  ?Electronically Signed ?  By: Titus Dubin M.D. ?  On: 03/28/2019 12:27 ? ? ? ?Subjective: ?Chief Complaint  ?Patient presents with  ? Lower Back - Pain  ? ? 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 She reports that began experiencing increased back and buttocks and thigh pains and is having difficullty with bending and sitting on the commode. Feels like sciatica. Pain is worse standing and walking and improves with sitting and bending and stooping. Previous Myelogram and post myelogram CT scan with narrowing of the foramen L3-4 and L4-5 and mild central stenosis L2-3 and moderate at L3-4 and L4-5. ? ? ?Review of Systems  ?Constitutional: Negative.   ?HENT: Negative.    ?Eyes: Negative.   ?Respiratory: Negative.    ?Cardiovascular: Negative.   ?Gastrointestinal: Negative.   ?Endocrine: Negative.   ?Genitourinary: Negative.   ?Musculoskeletal: Negative.   ?Skin: Negative.   ?Allergic/Immunologic: Negative.   ?Neurological: Negative.   ?Hematological: Negative.   ?Psychiatric/Behavioral: Negative.    ? ? ?Objective: ?Vital Signs: BP (!) 169/93 (BP Location: Left Arm, Patient Position: Sitting)   Pulse 80   Ht '5\' 5"'$  (1.651 m)   Wt 229 lb (103.9 kg)   LMP 05/04/2012   BMI 38.11 kg/m?  ? ?Physical Exam ?Constitutional:   ?   Appearance: She is well-developed.  ?HENT:  ?   Head: Normocephalic and atraumatic.  ?Eyes:  ?   Pupils: Pupils are equal, round, and reactive to light.  ?Pulmonary:  ?  Effort: Pulmonary effort is normal.  ?   Breath sounds: Normal breath sounds.  ?Abdominal:  ?   General: Bowel sounds are normal.  ?   Palpations: Abdomen is soft.  ?Musculoskeletal:  ?   Cervical back: Normal range of motion and neck supple.  ?   Lumbar back: Negative right straight leg raise test and negative left straight leg raise test.  ?Skin: ?   General: Skin is warm and dry.  ?Neurological:  ?   Mental Status: She is alert and oriented to person, place, and time.  ?Psychiatric:     ?   Behavior: Behavior normal.     ?   Thought Content: Thought content normal.     ?   Judgment: Judgment normal.  ? ?Back Exam  ? ?Tenderness  ?The patient is experiencing tenderness in the lumbar. ? ?Range of  Motion  ?Extension:  abnormal  ?Flexion:  abnormal  ?Lateral bend right:  abnormal  ?Lateral bend left:  abnormal  ?Rotation right:  abnormal  ?Rotation left:  abnormal  ? ?Muscle Strength  ?Right Quadriceps

## 2021-06-07 NOTE — Addendum Note (Signed)
Addended by: [name] on: 06/07/2021 04:58 PM ? ? Modules accepted: Orders ? ?

## 2021-06-07 NOTE — Patient Instructions (Addendum)
Plan: Avoid bending, stooping and avoid lifting weights greater than 10 lbs. ?Avoid prolong standing and walking. ?Avoid frequent bending and stooping  ?No lifting greater than 10 lbs. ?May use ice or moist heat for pain. ?Weight loss is of benefit. ?Handicap license is approved. ?Avoid bending, stooping and avoid lifting weights greater than 10 lbs. ?Avoid prolong standing and walking. ?Order for a new walker with wheels. ?Surgery scheduling secretary Kandice Hams, will call you in the next week to schedule for surgery.  ?Surgery recommended is a two level lumbar decompression L3-4 and L4-5. ?Take hydrocodone for for pain. ?Risk of surgery includes risk of infection 1 in 300 patients, bleeding less than 1/2% chance you would need a transfusion.   Risk to the nerves is one in 10,000. ?Will need an MRI done prior to scheduling intervention.  ?Expect improved walking and standing tolerance. Expect relief of leg pain. ?This will not improve the weakness in hip flexion strength of the left hip as I believe this is not due to the spine but chrone in ability to move the hip prior to THR caused long term weakness. Numbness may persist depending on the length and degree of pressure that has been present. ?  ?

## 2021-06-07 NOTE — Telephone Encounter (Signed)
Pt calling stating she is severe pain and can not stand without walker or cane. Pt doe have follow up sch for 4/17. The best call back number is [phone].  ?

## 2021-06-07 NOTE — Telephone Encounter (Signed)
Lmom for pt to let her know I had a cancellation for 2:30 today and asked if she can make that, I asked her to call back and let us know. ? ?

## 2021-06-14 ENCOUNTER — Ambulatory Visit: Payer: 59 | Admitting: Specialist

## 2021-06-19 ENCOUNTER — Ambulatory Visit (HOSPITAL_COMMUNITY)
Admission: RE | Admit: 2021-06-19 | Discharge: 2021-06-19 | Disposition: A | Discharge status: Home / Self Care | Payer: 59 | Source: Ambulatory Visit | Attending: Specialist | Admitting: Specialist

## 2021-06-19 DIAGNOSIS — M48062 Spinal stenosis, lumbar region with neurogenic claudication: Secondary | ICD-10-CM | POA: Diagnosis present

## 2021-06-19 DIAGNOSIS — M47816 Spondylosis without myelopathy or radiculopathy, lumbar region: Secondary | ICD-10-CM | POA: Diagnosis present

## 2021-06-19 DIAGNOSIS — M51369 Other intervertebral disc degeneration, lumbar region without mention of lumbar back pain or lower extremity pain: Secondary | ICD-10-CM

## 2021-06-19 DIAGNOSIS — R29898 Other symptoms and signs involving the musculoskeletal system: Secondary | ICD-10-CM

## 2021-06-19 DIAGNOSIS — Q7649 Other congenital malformations of spine, not associated with scoliosis: Secondary | ICD-10-CM

## 2021-06-19 DIAGNOSIS — M5136 Other intervertebral disc degeneration, lumbar region: Secondary | ICD-10-CM | POA: Diagnosis present

## 2021-06-24 ENCOUNTER — Telehealth: Payer: Self-pay | Admitting: Specialist

## 2021-06-24 NOTE — Telephone Encounter (Signed)
Pt called asking for a call back from [place]. Pt has medical questions about her upcoming appt. Please call pt at [phone]. ?

## 2021-06-24 NOTE — Telephone Encounter (Signed)
I called, she was asking about some type of "compression" being done, I advised that looking at the note that he is recommending a "decompression surgery" but that he wanted her to have the MRI done first before it was scheduled. She has the MRI done and now she is scheduled to review it on 07/07/21 ?

## 2021-07-07 ENCOUNTER — Encounter: Payer: Self-pay | Admitting: Specialist

## 2021-07-07 ENCOUNTER — Ambulatory Visit (INDEPENDENT_AMBULATORY_CARE_PROVIDER_SITE_OTHER): Payer: 59 | Admitting: Specialist

## 2021-07-07 VITALS — BP 152/90 | HR 102 | Ht 65.0 in | Wt 229.0 lb

## 2021-07-07 DIAGNOSIS — M5136 Other intervertebral disc degeneration, lumbar region: Secondary | ICD-10-CM | POA: Diagnosis not present

## 2021-07-07 DIAGNOSIS — M47816 Spondylosis without myelopathy or radiculopathy, lumbar region: Secondary | ICD-10-CM

## 2021-07-07 DIAGNOSIS — R29898 Other symptoms and signs involving the musculoskeletal system: Secondary | ICD-10-CM | POA: Diagnosis not present

## 2021-07-07 DIAGNOSIS — M48062 Spinal stenosis, lumbar region with neurogenic claudication: Secondary | ICD-10-CM

## 2021-07-07 MED ORDER — PREGABALIN 50 MG PO CAPS: 50.0000 mg | ORAL_CAPSULE | Freq: Two times a day (BID) | ORAL | 2 refills | Status: DC

## 2021-07-07 NOTE — Addendum Note (Signed)
Addended by: [name] on: 07/07/2021 10:23 AM ? ? Modules accepted: Orders ? ?

## 2021-07-07 NOTE — Progress Notes (Signed)
? ?Office Visit Note ?  ?Patient: Darlene Crosby           ?Date of Birth: 11-Jul-1965           ?MRN: [id number] ?Visit Date: 07/07/2021 ?             ?Requested by: Camillia Herter, NP ?[address] ?[place],   [id number] ?PCP: Camillia Herter, NP ? ? ?Assessment & Plan: ?Visit Diagnoses:  ?1. Spinal stenosis of lumbar region with neurogenic claudication   ?2. Degeneration of lumbar intervertebral disc   ?3. Lumbar spondylosis   ?4. Weakness of left hip   ? ? ?Plan: Avoid bending, stooping and avoid lifting weights greater than 10 lbs. ?Avoid prolong standing and walking. ?Order for a new walker with wheels. ?Surgery scheduling secretary Kandice Hams, will call you in the next week to schedule for surgery.  ?Surgery recommended is a two level bilateral lumbar decompressive laminectomy L3-4 and L4-5 this would be done with the OR microscope for spinal stenosis ?Take hydrocodone for for pain. ?Risk of surgery includes risk of infection 1 in 200 patients, bleeding 1/2% chance you would need a transfusion.   Risk to the nerves is one in 10,000. ?Expect improved walking and standing tolerance. Expect relief of leg pain but numbness ?may persist depending on the length and degree of pressure that has been present. ?  ? ?Follow-Up Instructions: No follow-ups on file.  ? ?Orders:  ?No orders of the defined types were placed in this encounter. ? ?No orders of the defined types were placed in this encounter. ? ? ? ? Procedures: ?No procedures performed ? ? ?Clinical Data: ?No additional findings. ? ? ?Subjective: ?Chief Complaint  ?Patient presents with  ? Lower Back - Follow-up  ?  MRI Review  ? ? ?56 year old female with history of left THR and ongoing complaints of back pain with difficulty with standing and walking. Stooping and leaning on cart with grocery shopping. She hates shopping and she takes grandchild with her due to pain and limits her abilty to walk. No bowel or bladder difficulty.   ? ? ?Review of Systems  ?Constitutional: Negative.   ?HENT:  Positive for congestion, rhinorrhea, sinus pain, sneezing and sore throat.   ?Eyes: Negative.   ?Respiratory:  Positive for cough and choking.   ?Cardiovascular: Negative.   ?Gastrointestinal: Negative.   ?Endocrine: Negative.   ?Genitourinary: Negative.   ?Musculoskeletal: Negative.   ?Skin: Negative.   ?Allergic/Immunologic: Negative.   ?Neurological: Negative.   ?Hematological: Negative.   ?Psychiatric/Behavioral: Negative.    ? ? ?Objective: ?Vital Signs: BP (!) 152/90 (BP Location: Left Arm, Patient Position: Sitting)   Pulse (!) 102   Ht '5\' 5"'$  (1.651 m)   Wt 229 lb (103.9 kg)   LMP 05/04/2012   BMI 38.11 kg/m?  ? ?Physical Exam ?Constitutional:   ?   Appearance: She is well-developed.  ?HENT:  ?   Head: Normocephalic and atraumatic.  ?Eyes:  ?   Pupils: Pupils are equal, round, and reactive to light.  ?Pulmonary:  ?   Effort: Pulmonary effort is normal.  ?   Breath sounds: Normal breath sounds.  ?Abdominal:  ?   General: Bowel sounds are normal.  ?   Palpations: Abdomen is soft.  ?Musculoskeletal:     ?   General: Normal range of motion.  ?   Cervical back: Normal range of motion and neck supple.  ?Skin: ?   General: Skin  is warm and dry.  ?Neurological:  ?   Mental Status: She is alert and oriented to person, place, and time.  ?Psychiatric:     ?   Behavior: Behavior normal.     ?   Thought Content: Thought content normal.     ?   Judgment: Judgment normal.  ? ? ?Ortho Exam ? ?Specialty Comments:  ?No specialty comments available. ? ?Imaging: ?No results found. ? ? ?PMFS History: ?Patient Active Problem List  ? Diagnosis Date Noted  ? Body mass index (BMI) 37.0-37.9, adult 03/14/2019  ? Lumbar stenosis without neurogenic claudication 03/14/2019  ? Hyperlipidemia 02/08/2019  ? Lumbar spondylosis 10/25/2018  ? Degeneration of lumbar intervertebral disc 10/01/2018  ? Surgical wound dehiscence 06/06/2018  ? Osteoarthritis of left hip 04/25/2018  ?  Spinal stenosis of lumbar region 02/23/2018  ? Pain in left knee 01/10/2018  ? Trigger thumb of right hand 03/30/2017  ? Dyspnea 03/26/2013  ? Asthma vs VCD  02/28/2013  ? Hypertensive disorder 06/07/2012  ? Pyogenic granuloma 02/15/2012  ? ?Past Medical History:  ?Diagnosis Date  ? Asthma   ? followed by pcp  ? Hypertension   ? OA (osteoarthritis)   ? left knee, right shoulder, left hip  ? Seasonal allergies   ? Wears glasses   ?  ?Family History  ?Problem Relation Age of Onset  ? Hypertension Mother   ? Asthma Mother   ? Allergies Mother   ? Breast cancer Sister   ? Heart disease Sister   ? Hypertension Sister   ? Stroke Sister   ? Diabetes Sister   ? Colon cancer Neg Hx   ? Esophageal cancer Neg Hx   ? Stomach cancer Neg Hx   ? Pancreatic cancer Neg Hx   ?  ?Past Surgical History:  ?Procedure Laterality Date  ? ANTERIOR HIP REVISION Left 06/06/2018  ? Procedure: LEFT HIP WOUND DEHISCENCE POSSIBLE HEAD AND LINER EXCHANGE;  Surgeon: Rod Can, MD;  Location: [facility];  Service: Orthopedics;  Laterality: Left;  ? COLONOSCOPY  2015  ? DIAGNOSTIC LAPAROSCOPY    ? tubal preg-took ovary and tube  ? LAPAROSCOPY FOR ECTOPIC PREGNANCY  1990s  ? LEFT SALPINGECTOMY AND RIGHT OOPHORECTOMY FOR ABNORMALITY  ? MASS EXCISION  02/15/2012  ? Procedure: EXCISION MASS;  Surgeon: Schuyler Amor, MD;  Location: [facility];  Service: Orthopedics;  Laterality: Right;  Excision of Right Small Volar Mass  ? MYELOGRAM    ? TONSILLECTOMY  age 35  ? TOTAL HIP ARTHROPLASTY Left 04/25/2018  ? Procedure: TOTAL HIP ARTHROPLASTY ANTERIOR APPROACH;  Surgeon: Rod Can, MD;  Location: [facility];  Service: Orthopedics;  Laterality: Left;  ? TRIGGER FINGER RELEASE Right 2018  ? thumb  ? WISDOM TOOTH EXTRACTION  age 83  ? ?Social History  ? ?Occupational History  ? Not on file  ?Tobacco Use  ? Smoking status: Never  ?  Passive exposure: Never  ? Smokeless tobacco: Never  ?Vaping Use  ? Vaping Use: Never used  ?Substance and  Sexual Activity  ? Alcohol use: No  ? Drug use: Never  ? Sexual activity: Not on file  ? ? ? ? ? ? ?

## 2021-07-07 NOTE — Patient Instructions (Signed)
Avoid bending, stooping and avoid lifting weights greater than 10 lbs. ?Avoid prolong standing and walking. ?Order for a new walker with wheels. ?Surgery scheduling secretary Kandice Hams, will call you in the next week to schedule for surgery.  ?Surgery recommended is a two level bilateral lumbar decompressive laminectomy L3-4 and L4-5 this would be done with the OR microscope for spinal stenosis ?Take hydrocodone for for pain. ?Risk of surgery includes risk of infection 1 in 200 patients, bleeding 1/2% chance you would need a transfusion.   Risk to the nerves is one in 10,000. ?Expect improved walking and standing tolerance. Expect relief of leg pain but numbness ?may persist depending on the length and degree of pressure that has been present. ?  ?

## 2021-08-05 ENCOUNTER — Encounter: Payer: Self-pay | Admitting: Specialist

## 2021-08-05 ENCOUNTER — Ambulatory Visit (INDEPENDENT_AMBULATORY_CARE_PROVIDER_SITE_OTHER): Payer: 59 | Admitting: Specialist

## 2021-08-05 VITALS — BP 147/81 | HR 98 | Ht 65.0 in | Wt 229.0 lb

## 2021-08-05 DIAGNOSIS — M5136 Other intervertebral disc degeneration, lumbar region: Secondary | ICD-10-CM

## 2021-08-05 DIAGNOSIS — M47816 Spondylosis without myelopathy or radiculopathy, lumbar region: Secondary | ICD-10-CM | POA: Diagnosis not present

## 2021-08-05 DIAGNOSIS — Z96642 Presence of left artificial hip joint: Secondary | ICD-10-CM

## 2021-08-05 DIAGNOSIS — R29898 Other symptoms and signs involving the musculoskeletal system: Secondary | ICD-10-CM | POA: Diagnosis not present

## 2021-08-05 DIAGNOSIS — M48062 Spinal stenosis, lumbar region with neurogenic claudication: Secondary | ICD-10-CM | POA: Diagnosis not present

## 2021-08-05 NOTE — Patient Instructions (Signed)
Plan: Avoid bending, stooping and avoid lifting weights greater than 10 lbs. Avoid prolong standing and walking. Order for a new walker with wheels. Surgery scheduling secretary Kandice Hams, will call you in the next week to schedule for surgery.  Surgery recommended is a two level lumbar left and central hemilaminectomies L3-4 and L4-5 this would be done with microscope. Take hydrocodone for for pain. Risk of surgery includes risk of infection 1 in 300 patients, bleeding 1/2% chance you would need a transfusion.   Risk to the nerves is one in 10,000.  Expect improved walking and standing tolerance. I expect that you will still have left hip weakness but left knee extension strength should improve. Expect relief of leg pain but numbness may persist depending on the length and degree of pressure that has been present. I think your capacity to perform heavy and medium work is not able to be improved with surgery or therapy. Part time sedentary or light work is most likely the level you will be able to resume with intervention.

## 2021-08-05 NOTE — Progress Notes (Signed)
Office Visit Note   Patient: ELEANER DIBARTOLO           Date of Birth: 03-08-1965           MRN: [id number] Visit Date: 08/05/2021              Requested by: Camillia Herter, NP [address],  [place] [address] PCP: Camillia Herter, NP   Assessment & Plan: Visit Diagnoses:  1. Degeneration of lumbar intervertebral disc   2. Spinal stenosis of lumbar region with neurogenic claudication   3. Lumbar spondylosis   4. Weakness of left hip   5. History of total left hip replacement     Plan: Avoid bending, stooping and avoid lifting weights greater than 10 lbs. Avoid prolong standing and walking. Order for a new walker with wheels. Surgery scheduling secretary Kandice Hams, will call you in the next week to schedule for surgery.  Surgery recommended is a two level lumbar left and central hemilaminectomies L3-4 and L4-5 this would be done with microscope. Take hydrocodone for for pain. Risk of surgery includes risk of infection 1 in 300 patients, bleeding 1/2% chance you would need a transfusion.   Risk to the nerves is one in 10,000.  Expect improved walking and standing tolerance. I expect that you will still have left hip weakness but left knee extension strength should improve. Expect relief of leg pain but numbness may persist depending on the length and degree of pressure that has been present. I think your capacity to perform heavy and medium work is not able to be improved with surgery or therapy. Part time sedentary or light work is most likely the level you will be able to resume with intervention.  Follow-Up Instructions: No follow-ups on file.   Orders:  No orders of the defined types were placed in this encounter.  No orders of the defined types were placed in this encounter.     Procedures: No procedures performed   Clinical Data: No additional findings.   Subjective: Chief Complaint  Patient presents with   Lower Back - Pain     56 year old with history of left leg pain and is taking lyrica and she reports that the medication makes her sleepy and she has to relax and just stay in a recliner.    Review of Systems   Objective: Vital Signs: BP (!) 147/81 (BP Location: Left Arm, Patient Position: Sitting, Cuff Size: Large)   Pulse 98   Ht '5\' 5"'$  (1.651 m)   Wt 229 lb (103.9 kg)   LMP 05/04/2012   BMI 38.11 kg/m   Physical Exam  Ortho Exam  Specialty Comments:  No specialty comments available.  Imaging: No results found.   PMFS History: Patient Active Problem List   Diagnosis Date Noted   Body mass index (BMI) 37.0-37.9, adult 03/14/2019   Lumbar stenosis without neurogenic claudication 03/14/2019   Hyperlipidemia 02/08/2019   Lumbar spondylosis 10/25/2018   Degeneration of lumbar intervertebral disc 10/01/2018   Surgical wound dehiscence 06/06/2018   Osteoarthritis of left hip 04/25/2018   Spinal stenosis of lumbar region 02/23/2018   Pain in left knee 01/10/2018   Trigger thumb of right hand 03/30/2017   Dyspnea 03/26/2013   Asthma vs VCD  02/28/2013   Hypertensive disorder 06/07/2012   Pyogenic granuloma 02/15/2012   Past Medical History:  Diagnosis Date   Asthma    followed by pcp   Hypertension  OA (osteoarthritis)    left knee, right shoulder, left hip   Seasonal allergies    Wears glasses     Family History  Problem Relation Age of Onset   Hypertension Mother    Asthma Mother    Allergies Mother    Breast cancer Sister    Heart disease Sister    Hypertension Sister    Stroke Sister    Diabetes Sister    Colon cancer Neg Hx    Esophageal cancer Neg Hx    Stomach cancer Neg Hx    Pancreatic cancer Neg Hx     Past Surgical History:  Procedure Laterality Date   ANTERIOR HIP REVISION Left 06/06/2018   Procedure: LEFT HIP WOUND DEHISCENCE POSSIBLE HEAD AND LINER EXCHANGE;  Surgeon: Rod Can, MD;  Location: [facility];  Service: Orthopedics;  Laterality: Left;    COLONOSCOPY  2015   DIAGNOSTIC LAPAROSCOPY     tubal preg-took ovary and tube   LAPAROSCOPY FOR ECTOPIC PREGNANCY  1990s   LEFT SALPINGECTOMY AND RIGHT OOPHORECTOMY FOR ABNORMALITY   MASS EXCISION  02/15/2012   Procedure: EXCISION MASS;  Surgeon: Schuyler Amor, MD;  Location: [facility];  Service: Orthopedics;  Laterality: Right;  Excision of Right Small Volar Mass   MYELOGRAM     TONSILLECTOMY  age 56   TOTAL HIP ARTHROPLASTY Left 04/25/2018   Procedure: TOTAL HIP ARTHROPLASTY ANTERIOR APPROACH;  Surgeon: Rod Can, MD;  Location: [facility];  Service: Orthopedics;  Laterality: Left;   TRIGGER FINGER RELEASE Right 2018   thumb   WISDOM TOOTH EXTRACTION  age 25   Social History   Occupational History   Not on file  Tobacco Use   Smoking status: Never    Passive exposure: Never   Smokeless tobacco: Never  Vaping Use   Vaping Use: Never used  Substance and Sexual Activity   Alcohol use: No   Drug use: Never   Sexual activity: Not on file

## 2021-08-17 ENCOUNTER — Telehealth: Payer: Self-pay

## 2021-08-17 NOTE — Telephone Encounter (Signed)
Started blue sheet and put on his desk

## 2021-08-17 NOTE — Telephone Encounter (Signed)
Patient called inquiring about scheduling surgery.  Need surgery sheet.  Thanks!

## 2021-08-18 NOTE — Telephone Encounter (Signed)
He has started that blue sheet

## 2021-08-23 ENCOUNTER — Other Ambulatory Visit (HOSPITAL_COMMUNITY)
Admission: RE | Admit: 2021-08-23 | Discharge: 2021-08-23 | Disposition: A | Discharge status: Home / Self Care | Payer: 59 | Source: Ambulatory Visit | Attending: Obstetrics and Gynecology | Admitting: Obstetrics and Gynecology

## 2021-08-23 ENCOUNTER — Other Ambulatory Visit: Payer: Self-pay | Admitting: Specialist

## 2021-08-23 ENCOUNTER — Other Ambulatory Visit: Payer: Self-pay | Admitting: Obstetrics and Gynecology

## 2021-08-23 DIAGNOSIS — Z01419 Encounter for gynecological examination (general) (routine) without abnormal findings: Secondary | ICD-10-CM | POA: Diagnosis present

## 2021-08-24 NOTE — Telephone Encounter (Signed)
He has completed

## 2021-08-26 ENCOUNTER — Other Ambulatory Visit: Payer: Self-pay

## 2021-08-27 LAB — CYTOLOGY - PAP
Comment: NEGATIVE
Comment: NEGATIVE
Comment: NEGATIVE
Diagnosis: UNDETERMINED — AB
HPV 16: NEGATIVE
HPV 18 / 45: NEGATIVE
High risk HPV: POSITIVE — AB

## 2021-09-07 ENCOUNTER — Other Ambulatory Visit: Payer: Self-pay

## 2021-09-09 ENCOUNTER — Ambulatory Visit (INDEPENDENT_AMBULATORY_CARE_PROVIDER_SITE_OTHER): Payer: 59 | Admitting: Surgery

## 2021-09-09 ENCOUNTER — Encounter: Payer: Self-pay | Admitting: Surgery

## 2021-09-09 VITALS — BP 148/89 | HR 80

## 2021-09-09 DIAGNOSIS — M5136 Other intervertebral disc degeneration, lumbar region: Secondary | ICD-10-CM

## 2021-09-09 DIAGNOSIS — M47816 Spondylosis without myelopathy or radiculopathy, lumbar region: Secondary | ICD-10-CM

## 2021-09-09 NOTE — Progress Notes (Signed)
56 year old white female history of L3-4 and L4-5 stenosis, back pain and bilateral lower extremity radiculopathy comes in for preop evaluation.  States that symptoms unchanged from previous visit.  She is wanting to proceed with L3-4 AND L4-5 CENTRAL LUMBAR LAMINECTOMIES scheduled.  Today history physical performed.  Review of systems negative.   Plan Surgical procedure discussed along potential rehab/recovery time.  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  I advised her that this absolutely will not be possible.  She can anticipate being out of work at least 4 to 6 weeks postop.  No driving for at least 6 weeks postop.  All questions answered.

## 2021-09-14 NOTE — Pre-Procedure Instructions (Signed)
Surgical Instructions    Your procedure is scheduled on Monday, July 24th.  Report to [facility] Main Entrance "A" at 5:30 A.M., then check in with the Admitting office.  Call this number if you have problems the morning of surgery:  [phone]   If you have any questions prior to your surgery date call [phone]: Open Monday-Friday 8am-4pm    Remember:  Do not eat after midnight the night before your surgery  You may drink clear liquids until 4:30 a.m. the morning of your surgery.   Clear liquids allowed are: Water, Non-Citrus Juices (without pulp), Carbonated Beverages, Clear Tea, Black Coffee ONLY (NO MILK, CREAM OR POWDERED CREAMER of any kind), and Gatorade    Take these medicines the morning of surgery with A SIP OF WATER:  amLODipine (NORVASC)  fluticasone (FLONASE) levocetirizine (XYZAL)  pregabalin (LYRICA)    Take these medications as needed: acetaminophen (TYLENOL) Albuterol inhaler-please bring with you on the day of surgery cyclobenzaprine (FLEXERIL) ipratropium-albuterol (DUONEB)   As of today, STOP taking any Aspirin (unless otherwise instructed by your surgeon) Aleve, Naproxen, Ibuprofen, Motrin, Advil, Goody's, BC's, all herbal medications, fish oil, and all vitamins. This includes: diclofenac (VOLTAREN).           Do not wear jewelry or makeup Do not wear lotions, powders, perfumes, or deodorant. Do not shave 48 hours prior to surgery.  Do not bring valuables to the hospital. Do not wear nail polish, gel polish, artificial nails, or any other type of covering on natural nails (fingers and toes) If you have artificial nails or gel coating that need to be removed by a nail salon, please have this removed prior to surgery. Artificial nails or gel coating may interfere with anesthesia's ability to adequately monitor your vital signs.  [facility] is not responsible for any belongings or valuables. .   Do NOT Smoke (Tobacco/Vaping)  24 hours prior to your  procedure  If you use a CPAP at night, you may bring your mask for your overnight stay.   Contacts, glasses, hearing aids, dentures or partials may not be worn into surgery, please bring cases for these belongings   For patients admitted to the hospital, discharge time will be determined by your treatment team.   Patients discharged the day of surgery will not be allowed to drive home, and someone needs to stay with them for 24 hours.   SURGICAL WAITING ROOM VISITATION Patients having surgery or a procedure may have no more than 2 support people in the waiting area - these visitors may rotate.   Children under the age of 42 must have an adult with them who is not the patient. If the patient needs to stay at the hospital during part of their recovery, the visitor guidelines for inpatient rooms apply. Pre-op nurse will coordinate an appropriate time for 1 support person to accompany patient in pre-op.  This support person may not rotate.   Please refer to the [facility] website for the visitor guidelines for Inpatients (after your surgery is over and you are in a regular room).    Special instructions:    Oral Hygiene is also important to reduce your risk of infection.  Remember - BRUSH YOUR TEETH THE MORNING OF SURGERY WITH YOUR REGULAR TOOTHPASTE   [place]- Preparing For Surgery  Before surgery, you can play an important role. Because skin is not sterile, your skin needs to be as free of germs as possible. You can reduce the number of  germs on your skin by washing with CHG (chlorahexidine gluconate) Soap before surgery.  CHG is an antiseptic cleaner which kills germs and bonds with the skin to continue killing germs even after washing.     Please do not use if you have an allergy to CHG or antibacterial soaps. If your skin becomes reddened/irritated stop using the CHG.  Do not shave (including legs and underarms) for at least 48 hours prior to first CHG shower. It is OK to shave  your face.  Please follow these instructions carefully.     Shower the NIGHT BEFORE SURGERY and the MORNING OF SURGERY with CHG Soap.   If you chose to wash your hair, wash your hair first as usual with your normal shampoo. After you shampoo, rinse your hair and body thoroughly to remove the shampoo.  Then [employer] and genitals (private parts) with your normal soap and rinse thoroughly to remove soap.  After that Use CHG Soap as you would any other liquid soap. You can apply CHG directly to the skin and wash gently with a scrungie or a clean washcloth.   Apply the CHG Soap to your body ONLY FROM THE NECK DOWN.  Do not use on open wounds or open sores. Avoid contact with your eyes, ears, mouth and genitals (private parts). Wash Face and genitals (private parts)  with your normal soap.   Wash thoroughly, paying special attention to the area where your surgery will be performed.  Thoroughly rinse your body with warm water from the neck down.  DO NOT shower/wash with your normal soap after using and rinsing off the CHG Soap.  Pat yourself dry with a CLEAN TOWEL.  Wear CLEAN PAJAMAS to bed the night before surgery  Place CLEAN SHEETS on your bed the night before your surgery  DO NOT SLEEP WITH PETS.   Day of Surgery:  Take a shower with CHG soap. Wear Clean/Comfortable clothing the morning of surgery Do not apply any deodorants/lotions.   Remember to brush your teeth WITH YOUR REGULAR TOOTHPASTE.    If you received a COVID test during your pre-op visit, it is requested that you wear a mask when out in public, stay away from anyone that may not be feeling well, and notify your surgeon if you develop symptoms. If you have been in contact with anyone that has tested positive in the last 10 days, please notify your surgeon.    Please read over the following fact sheets that you were given.

## 2021-09-15 ENCOUNTER — Encounter (HOSPITAL_COMMUNITY)
Admission: RE | Admit: 2021-09-15 | Discharge: 2021-09-15 | Disposition: A | Discharge status: Home / Self Care | Payer: Commercial Managed Care - HMO | Source: Ambulatory Visit | Attending: Specialist | Admitting: Specialist

## 2021-09-15 ENCOUNTER — Encounter (HOSPITAL_COMMUNITY): Payer: Self-pay

## 2021-09-15 ENCOUNTER — Other Ambulatory Visit: Payer: Self-pay

## 2021-09-15 VITALS — BP 156/100 | HR 72 | Temp 97.9°F | Resp 18 | Ht 65.0 in | Wt 232.7 lb

## 2021-09-15 DIAGNOSIS — Z01818 Encounter for other preprocedural examination: Secondary | ICD-10-CM | POA: Diagnosis present

## 2021-09-15 DIAGNOSIS — I10 Essential (primary) hypertension: Secondary | ICD-10-CM | POA: Diagnosis not present

## 2021-09-15 HISTORY — DX: Pneumonia, unspecified organism: J18.9

## 2021-09-15 LAB — BASIC METABOLIC PANEL
Anion gap: 6 (ref 5–15)
BUN: 12 mg/dL (ref 6–20)
CO2: 24 mmol/L (ref 22–32)
Calcium: 9.2 mg/dL (ref 8.9–10.3)
Chloride: 108 mmol/L (ref 98–111)
Creatinine, Ser: 0.78 mg/dL (ref 0.44–1.00)
GFR, Estimated: >60 mL/min (ref >60)
Glucose, Bld: 98 mg/dL (ref 70–99)
Potassium: 3.4 mmol/L — ABNORMAL LOW (ref 3.5–5.1)
Sodium: 138 mmol/L (ref 135–145)

## 2021-09-15 LAB — CBC
HCT: 43 % (ref 36.0–46.0)
Hemoglobin: 14.6 g/dL (ref 12.0–15.0)
MCH: 27.8 pg (ref 26.0–34.0)
MCHC: 34 g/dL (ref 30.0–36.0)
MCV: 81.9 fL (ref 80.0–100.0)
Platelets: 348 10*3/uL (ref 150–400)
RBC: 5.25 MIL/uL — ABNORMAL HIGH (ref 3.87–5.11)
RDW: 13.4 % (ref 11.5–15.5)
WBC: 8.3 10*3/uL (ref 4.0–10.5)
nRBC: 0 % (ref 0.0–0.2)

## 2021-09-15 LAB — SURGICAL PCR SCREEN
MRSA, PCR: NEGATIVE
Staphylococcus aureus: NEGATIVE

## 2021-09-15 NOTE — Progress Notes (Signed)
PCP - Amy Minette Brine  Cardiologist - denies  PPM/ICD - denies Chest x-ray - N/A EKG - 09/15/2021  Stress Test - pt reports possible stress test a few years ago- pt unsure of who did this test- pt reports it was normal ECHO - denies Cardiac Cath - denies  Sleep Study - denies  Aspirin Instructions: N/A  ERAS Protcol -ERAS no drink COVID TEST- N/A  BP 156/100 with 149/99 re-check. Pt reports BP at home is 145/86. Pt denies any symptoms, stating that she took her blood pressure medication this morning.   Anesthesia review: review EKG  Patient denies shortness of breath, fever, cough and chest pain at PAT appointment   All instructions explained to the patient, with a verbal understanding of the material. Patient agrees to go over the instructions while at home for a better understanding. Patient also instructed to self quarantine after being tested for COVID-19. The opportunity to ask questions was provided.

## 2021-09-19 NOTE — Anesthesia Preprocedure Evaluation (Addendum)
Anesthesia Evaluation  Patient identified by MRN, date of birth, ID band Patient awake    Reviewed: Allergy & Precautions, NPO status , Patient's Chart, lab work & pertinent test results  Airway Mallampati: II  TM Distance: >3 FB Neck ROM: Full    Dental  (+) Missing, Chipped, Dental Advisory Given,    Pulmonary shortness of breath, asthma ,    Pulmonary exam normal breath sounds clear to auscultation       Cardiovascular hypertension, Pt. on medications Normal cardiovascular exam Rhythm:Regular Rate:Normal     Neuro/Psych negative neurological ROS  negative psych ROS   GI/Hepatic negative GI ROS, Neg liver ROS,   Endo/Other  negative endocrine ROS  Renal/GU negative Renal ROS  negative genitourinary   Musculoskeletal  (+) Arthritis ,   Abdominal   Peds  Hematology negative hematology ROS (+)   Anesthesia Other Findings   Reproductive/Obstetrics                            Anesthesia Physical Anesthesia Plan  ASA: 2  Anesthesia Plan: General   Post-op Pain Management: Tylenol PO (pre-op)* and Ketamine IV*   Induction: Intravenous  PONV Risk Score and Plan: 3 and Midazolam, Dexamethasone and Ondansetron  Airway Management Planned: Oral ETT  Additional Equipment:   Intra-op Plan:   Post-operative Plan: Extubation in OR  Informed Consent: I have reviewed the patients History and Physical, chart, labs and discussed the procedure including the risks, benefits and alternatives for the proposed anesthesia with the patient or authorized representative who has indicated his/her understanding and acceptance.     Dental advisory given  Plan Discussed with: CRNA  Anesthesia Plan Comments:         Anesthesia Quick Evaluation

## 2021-09-20 ENCOUNTER — Other Ambulatory Visit: Payer: Self-pay

## 2021-09-20 ENCOUNTER — Ambulatory Visit (HOSPITAL_COMMUNITY): Payer: Commercial Managed Care - HMO

## 2021-09-20 ENCOUNTER — Observation Stay (HOSPITAL_COMMUNITY)
Admission: RE | Admit: 2021-09-20 | Discharge: 2021-09-21 | Disposition: A | Discharge status: Home / Self Care | Payer: Commercial Managed Care - HMO | Source: Ambulatory Visit | Attending: Specialist | Admitting: Specialist

## 2021-09-20 ENCOUNTER — Ambulatory Visit (HOSPITAL_BASED_OUTPATIENT_CLINIC_OR_DEPARTMENT_OTHER): Payer: Commercial Managed Care - HMO | Admitting: Anesthesiology

## 2021-09-20 ENCOUNTER — Ambulatory Visit (HOSPITAL_COMMUNITY): Payer: Commercial Managed Care - HMO | Admitting: Physician Assistant

## 2021-09-20 ENCOUNTER — Encounter (HOSPITAL_COMMUNITY): Payer: Self-pay | Admitting: Specialist

## 2021-09-20 ENCOUNTER — Encounter (HOSPITAL_COMMUNITY)
Admission: RE | Disposition: A | Discharge status: Home / Self Care | Payer: Self-pay | Source: Ambulatory Visit | Attending: Specialist

## 2021-09-20 DIAGNOSIS — Z96642 Presence of left artificial hip joint: Secondary | ICD-10-CM | POA: Diagnosis not present

## 2021-09-20 DIAGNOSIS — J45909 Unspecified asthma, uncomplicated: Secondary | ICD-10-CM | POA: Diagnosis not present

## 2021-09-20 DIAGNOSIS — M48061 Spinal stenosis, lumbar region without neurogenic claudication: Secondary | ICD-10-CM | POA: Diagnosis present

## 2021-09-20 DIAGNOSIS — M47816 Spondylosis without myelopathy or radiculopathy, lumbar region: Secondary | ICD-10-CM | POA: Diagnosis present

## 2021-09-20 DIAGNOSIS — I10 Essential (primary) hypertension: Secondary | ICD-10-CM | POA: Insufficient documentation

## 2021-09-20 DIAGNOSIS — Z9889 Other specified postprocedural states: Secondary | ICD-10-CM

## 2021-09-20 HISTORY — PX: LUMBAR LAMINECTOMY: SHX95

## 2021-09-20 SURGERY — MICRODISCECTOMY LUMBAR LAMINECTOMY
Anesthesia: General | Site: Spine Lumbar

## 2021-09-20 MED ORDER — LIDOCAINE 2% (20 MG/ML) 5 ML SYRINGE
INTRAMUSCULAR | Status: DC | PRN
  Administered 2021-09-20: 80 mg via INTRAVENOUS

## 2021-09-20 MED ORDER — IPRATROPIUM-ALBUTEROL 0.5-2.5 (3) MG/3ML IN SOLN
3.0000 mL | Freq: Four times a day (QID) | RESPIRATORY_TRACT | Status: DC | PRN
Start: 2021-09-20 — End: 2021-09-21

## 2021-09-20 MED ORDER — METHOCARBAMOL 500 MG PO TABS
500.0000 mg | ORAL_TABLET | Freq: Four times a day (QID) | ORAL | Status: DC | PRN
  Administered 2021-09-20 (×2): 500 mg via ORAL
  Filled 2021-09-20 (×2): qty 1

## 2021-09-20 MED ORDER — MIDAZOLAM HCL 2 MG/2ML IJ SOLN
INTRAMUSCULAR | Status: AC
  Filled 2021-09-20: qty 2

## 2021-09-20 MED ORDER — ORAL CARE MOUTH RINSE: 15.0000 mL | Freq: Once | OROMUCOSAL | Status: AC

## 2021-09-20 MED ORDER — ACETAMINOPHEN 500 MG PO TABS
1000.0000 mg | ORAL_TABLET | Freq: Once | ORAL | Status: AC
  Administered 2021-09-20: 1000 mg via ORAL
  Filled 2021-09-20: qty 2

## 2021-09-20 MED ORDER — DEXMEDETOMIDINE HCL IN NACL 80 MCG/20ML IV SOLN
INTRAVENOUS | Status: AC
  Filled 2021-09-20: qty 20

## 2021-09-20 MED ORDER — TURMERIC 500 MG PO CAPS: 500.0000 mg | ORAL_CAPSULE | Freq: Every day | ORAL | Status: DC

## 2021-09-20 MED ORDER — HYDROCODONE-ACETAMINOPHEN 7.5-325 MG PO TABS
1.0000 | ORAL_TABLET | Freq: Four times a day (QID) | ORAL | Status: DC
  Administered 2021-09-20 – 2021-09-21 (×4): 1 via ORAL
  Filled 2021-09-20 (×4): qty 1

## 2021-09-20 MED ORDER — ALUM & MAG HYDROXIDE-SIMETH 200-200-20 MG/5ML PO SUSP: 30.0000 mL | Freq: Four times a day (QID) | ORAL | Status: DC | PRN

## 2021-09-20 MED ORDER — BUPIVACAINE LIPOSOME 1.3 % IJ SUSP
INTRAMUSCULAR | Status: AC
  Filled 2021-09-20: qty 20

## 2021-09-20 MED ORDER — HYDROCODONE-ACETAMINOPHEN 7.5-325 MG PO TABS: 1.0000 | ORAL_TABLET | ORAL | Status: DC | PRN

## 2021-09-20 MED ORDER — SODIUM CHLORIDE 0.9 % IV SOLN: INTRAVENOUS | Status: DC

## 2021-09-20 MED ORDER — DOCUSATE SODIUM 100 MG PO CAPS
100.0000 mg | ORAL_CAPSULE | Freq: Two times a day (BID) | ORAL | Status: DC
  Administered 2021-09-20 – 2021-09-21 (×3): 100 mg via ORAL
  Filled 2021-09-20 (×3): qty 1

## 2021-09-20 MED ORDER — DEXMEDETOMIDINE (PRECEDEX) IN NS 20 MCG/5ML (4 MCG/ML) IV SYRINGE
PREFILLED_SYRINGE | INTRAVENOUS | Status: DC | PRN
  Administered 2021-09-20: 12 ug via INTRAVENOUS

## 2021-09-20 MED ORDER — ONDANSETRON HCL 4 MG PO TABS: 4.0000 mg | ORAL_TABLET | Freq: Four times a day (QID) | ORAL | Status: DC | PRN

## 2021-09-20 MED ORDER — SODIUM CHLORIDE 0.9% FLUSH
3.0000 mL | Freq: Two times a day (BID) | INTRAVENOUS | Status: DC
Start: 2021-09-20 — End: 2021-09-21
  Administered 2021-09-20 (×2): 3 mL via INTRAVENOUS

## 2021-09-20 MED ORDER — PHENYLEPHRINE HCL-NACL 20-0.9 MG/250ML-% IV SOLN
INTRAVENOUS | Status: DC | PRN
  Administered 2021-09-20: 35 ug/min via INTRAVENOUS

## 2021-09-20 MED ORDER — CEFAZOLIN SODIUM-DEXTROSE 2-4 GM/100ML-% IV SOLN
2.0000 g | INTRAVENOUS | Status: AC
  Administered 2021-09-20: 2 g via INTRAVENOUS
  Filled 2021-09-20: qty 100

## 2021-09-20 MED ORDER — THROMBIN (RECOMBINANT) 5000 UNITS EX SOLR
CUTANEOUS | Status: DC | PRN
  Administered 2021-09-20: 5000 [IU] via TOPICAL

## 2021-09-20 MED ORDER — DICLOFENAC SODIUM 25 MG PO TBEC
50.0000 mg | DELAYED_RELEASE_TABLET | Freq: Three times a day (TID) | ORAL | Status: DC
  Administered 2021-09-20 – 2021-09-21 (×2): 50 mg via ORAL
  Filled 2021-09-20 (×3): qty 2

## 2021-09-20 MED ORDER — BUPIVACAINE HCL (PF) 0.5 % IJ SOLN
INTRAMUSCULAR | Status: AC
  Filled 2021-09-20: qty 30

## 2021-09-20 MED ORDER — ALBUMIN HUMAN 5 % IV SOLN: INTRAVENOUS | Status: DC | PRN

## 2021-09-20 MED ORDER — DEXAMETHASONE SODIUM PHOSPHATE 10 MG/ML IJ SOLN
INTRAMUSCULAR | Status: DC | PRN
  Administered 2021-09-20: 10 mg via INTRAVENOUS

## 2021-09-20 MED ORDER — BUPIVACAINE LIPOSOME 1.3 % IJ SUSP
INTRAMUSCULAR | Status: DC | PRN
  Administered 2021-09-20: 16.5 mL

## 2021-09-20 MED ORDER — AMLODIPINE BESYLATE 5 MG PO TABS
5.0000 mg | ORAL_TABLET | Freq: Every day | ORAL | Status: DC
  Administered 2021-09-21: 5 mg via ORAL
  Filled 2021-09-20: qty 1

## 2021-09-20 MED ORDER — ACETAMINOPHEN 650 MG RE SUPP: 650.0000 mg | RECTAL | Status: DC | PRN

## 2021-09-20 MED ORDER — THROMBIN (RECOMBINANT) 5000 UNITS EX SOLR
CUTANEOUS | Status: AC
Start: 2021-09-20 — End: ?
  Filled 2021-09-20: qty 5000

## 2021-09-20 MED ORDER — LACTATED RINGERS IV SOLN: INTRAVENOUS | Status: DC

## 2021-09-20 MED ORDER — ALBUTEROL SULFATE HFA 108 (90 BASE) MCG/ACT IN AERS: 2.0000 | INHALATION_SPRAY | RESPIRATORY_TRACT | Status: DC | PRN

## 2021-09-20 MED ORDER — ROCURONIUM BROMIDE 10 MG/ML (PF) SYRINGE
PREFILLED_SYRINGE | INTRAVENOUS | Status: DC | PRN
  Administered 2021-09-20: 100 mg via INTRAVENOUS

## 2021-09-20 MED ORDER — VITAMIN B-12 1000 MCG PO TABS
1000.0000 ug | ORAL_TABLET | Freq: Every day | ORAL | Status: DC
  Administered 2021-09-21: 1000 ug via ORAL
  Filled 2021-09-20: qty 1

## 2021-09-20 MED ORDER — LIDOCAINE 2% (20 MG/ML) 5 ML SYRINGE
INTRAMUSCULAR | Status: AC
  Filled 2021-09-20: qty 5

## 2021-09-20 MED ORDER — ROCURONIUM BROMIDE 10 MG/ML (PF) SYRINGE
PREFILLED_SYRINGE | INTRAVENOUS | Status: AC
  Filled 2021-09-20: qty 10

## 2021-09-20 MED ORDER — SODIUM CHLORIDE 0.9 % IV SOLN: 250.0000 mL | INTRAVENOUS | Status: DC

## 2021-09-20 MED ORDER — PROPOFOL 10 MG/ML IV BOLUS
INTRAVENOUS | Status: AC
Start: 2021-09-20 — End: ?
  Filled 2021-09-20: qty 20

## 2021-09-20 MED ORDER — GABAPENTIN 300 MG PO CAPS
300.0000 mg | ORAL_CAPSULE | Freq: Three times a day (TID) | ORAL | Status: DC
  Administered 2021-09-20 – 2021-09-21 (×3): 300 mg via ORAL
  Filled 2021-09-20 (×3): qty 1

## 2021-09-20 MED ORDER — ONDANSETRON HCL 4 MG/2ML IJ SOLN
INTRAMUSCULAR | Status: AC
  Filled 2021-09-20: qty 2

## 2021-09-20 MED ORDER — PHENYLEPHRINE 80 MCG/ML (10ML) SYRINGE FOR IV PUSH (FOR BLOOD PRESSURE SUPPORT)
PREFILLED_SYRINGE | INTRAVENOUS | Status: DC | PRN
  Administered 2021-09-20 (×6): 80 ug via INTRAVENOUS

## 2021-09-20 MED ORDER — PHENOL 1.4 % MT LIQD: 1.0000 | OROMUCOSAL | Status: DC | PRN

## 2021-09-20 MED ORDER — DEXAMETHASONE SODIUM PHOSPHATE 10 MG/ML IJ SOLN
INTRAMUSCULAR | Status: AC
  Filled 2021-09-20: qty 1

## 2021-09-20 MED ORDER — BENZONATATE 100 MG PO CAPS
100.0000 mg | ORAL_CAPSULE | Freq: Three times a day (TID) | ORAL | Status: DC | PRN
Start: 2021-09-20 — End: 2021-09-21

## 2021-09-20 MED ORDER — BUPIVACAINE LIPOSOME 1.3 % IJ SUSP
10.0000 mL | Freq: Once | INTRAMUSCULAR | Status: DC
  Filled 2021-09-20: qty 10

## 2021-09-20 MED ORDER — POLYETHYLENE GLYCOL 3350 17 G PO PACK: 17.0000 g | PACK | Freq: Every day | ORAL | Status: DC | PRN

## 2021-09-20 MED ORDER — ONDANSETRON HCL 4 MG/2ML IJ SOLN: 4.0000 mg | Freq: Four times a day (QID) | INTRAMUSCULAR | Status: DC | PRN

## 2021-09-20 MED ORDER — HYDROCODONE-ACETAMINOPHEN 7.5-325 MG PO TABS: 2.0000 | ORAL_TABLET | ORAL | Status: DC | PRN

## 2021-09-20 MED ORDER — FENTANYL CITRATE (PF) 250 MCG/5ML IJ SOLN
INTRAMUSCULAR | Status: DC | PRN
  Administered 2021-09-20 (×3): 50 ug via INTRAVENOUS

## 2021-09-20 MED ORDER — VITAMIN D 25 MCG (1000 UNIT) PO TABS
2000.0000 [IU] | ORAL_TABLET | Freq: Every day | ORAL | Status: DC
  Administered 2021-09-21: 2000 [IU] via ORAL
  Filled 2021-09-20: qty 2

## 2021-09-20 MED ORDER — ACETAMINOPHEN 325 MG PO TABS: 650.0000 mg | ORAL_TABLET | ORAL | Status: DC | PRN

## 2021-09-20 MED ORDER — ONDANSETRON HCL 4 MG/2ML IJ SOLN
INTRAMUSCULAR | Status: DC | PRN
  Administered 2021-09-20: 4 mg via INTRAVENOUS

## 2021-09-20 MED ORDER — FLUTICASONE PROPIONATE 50 MCG/ACT NA SUSP
1.0000 | Freq: Every day | NASAL | Status: DC
  Filled 2021-09-20: qty 16

## 2021-09-20 MED ORDER — METHOCARBAMOL 1000 MG/10ML IJ SOLN: 500.0000 mg | Freq: Four times a day (QID) | INTRAVENOUS | Status: DC | PRN

## 2021-09-20 MED ORDER — 0.9 % SODIUM CHLORIDE (POUR BTL) OPTIME
TOPICAL | Status: DC | PRN
  Administered 2021-09-20: 1000 mL

## 2021-09-20 MED ORDER — PROPOFOL 10 MG/ML IV BOLUS
INTRAVENOUS | Status: AC
  Filled 2021-09-20: qty 20

## 2021-09-20 MED ORDER — FENTANYL CITRATE (PF) 100 MCG/2ML IJ SOLN: 25.0000 ug | INTRAMUSCULAR | Status: DC | PRN

## 2021-09-20 MED ORDER — CEFAZOLIN SODIUM-DEXTROSE 2-4 GM/100ML-% IV SOLN
2.0000 g | Freq: Three times a day (TID) | INTRAVENOUS | Status: AC
  Administered 2021-09-20 (×2): 2 g via INTRAVENOUS
  Filled 2021-09-20 (×2): qty 100

## 2021-09-20 MED ORDER — SUGAMMADEX SODIUM 200 MG/2ML IV SOLN
INTRAVENOUS | Status: DC | PRN
  Administered 2021-09-20: 200 mg via INTRAVENOUS

## 2021-09-20 MED ORDER — PROPOFOL 10 MG/ML IV BOLUS
INTRAVENOUS | Status: DC | PRN
  Administered 2021-09-20: 100 mg via INTRAVENOUS

## 2021-09-20 MED ORDER — HEMOSTATIC AGENTS (NO CHARGE) OPTIME
TOPICAL | Status: DC | PRN
  Administered 2021-09-20: 1 via TOPICAL

## 2021-09-20 MED ORDER — ROSUVASTATIN CALCIUM 20 MG PO TABS
20.0000 mg | ORAL_TABLET | Freq: Every day | ORAL | Status: DC
  Administered 2021-09-20: 20 mg via ORAL
  Filled 2021-09-20: qty 1

## 2021-09-20 MED ORDER — FLEET ENEMA 7-19 GM/118ML RE ENEM: 1.0000 | ENEMA | Freq: Once | RECTAL | Status: DC | PRN

## 2021-09-20 MED ORDER — SODIUM CHLORIDE 0.9% FLUSH: 3.0000 mL | INTRAVENOUS | Status: DC | PRN

## 2021-09-20 MED ORDER — BISACODYL 5 MG PO TBEC: 5.0000 mg | DELAYED_RELEASE_TABLET | Freq: Every day | ORAL | Status: DC | PRN

## 2021-09-20 MED ORDER — PHENYLEPHRINE 80 MCG/ML (10ML) SYRINGE FOR IV PUSH (FOR BLOOD PRESSURE SUPPORT)
PREFILLED_SYRINGE | INTRAVENOUS | Status: AC
  Filled 2021-09-20: qty 10

## 2021-09-20 MED ORDER — MORPHINE SULFATE (PF) 2 MG/ML IV SOLN: 1.0000 mg | INTRAVENOUS | Status: DC | PRN

## 2021-09-20 MED ORDER — CHLORHEXIDINE GLUCONATE 0.12 % MT SOLN
15.0000 mL | Freq: Once | OROMUCOSAL | Status: AC
  Administered 2021-09-20: 15 mL via OROMUCOSAL

## 2021-09-20 MED ORDER — ORAL CARE MOUTH RINSE: 15.0000 mL | Freq: Once | OROMUCOSAL | Status: DC

## 2021-09-20 MED ORDER — LORATADINE 10 MG PO TABS
10.0000 mg | ORAL_TABLET | Freq: Every evening | ORAL | Status: DC
Start: 2021-09-20 — End: 2021-09-21
  Filled 2021-09-20: qty 1

## 2021-09-20 MED ORDER — PREGABALIN 25 MG PO CAPS
50.0000 mg | ORAL_CAPSULE | Freq: Two times a day (BID) | ORAL | Status: DC
  Administered 2021-09-20 – 2021-09-21 (×3): 50 mg via ORAL
  Filled 2021-09-20 (×3): qty 2

## 2021-09-20 MED ORDER — CHLORHEXIDINE GLUCONATE 0.12 % MT SOLN
15.0000 mL | Freq: Once | OROMUCOSAL | Status: DC
  Filled 2021-09-20: qty 15

## 2021-09-20 MED ORDER — MIDAZOLAM HCL 2 MG/2ML IJ SOLN
INTRAMUSCULAR | Status: DC | PRN
  Administered 2021-09-20: 2 mg via INTRAVENOUS

## 2021-09-20 MED ORDER — MENTHOL 3 MG MT LOZG: 1.0000 | LOZENGE | OROMUCOSAL | Status: DC | PRN

## 2021-09-20 MED ORDER — BUPIVACAINE HCL 0.5 % IJ SOLN
INTRAMUSCULAR | Status: DC | PRN
  Administered 2021-09-20: 16.5 mL

## 2021-09-20 MED ORDER — FENTANYL CITRATE (PF) 250 MCG/5ML IJ SOLN
INTRAMUSCULAR | Status: AC
  Filled 2021-09-20: qty 5

## 2021-09-20 SURGICAL SUPPLY — 54 items
APL SKNCLS STERI-STRIP NONHPOA (GAUZE/BANDAGES/DRESSINGS) ×1
BAG COUNTER SPONGE SURGICOUNT (BAG) ×3 IMPLANT
BAG SPNG CNTER NS LX DISP (BAG) ×1
BENZOIN TINCTURE PRP APPL 2/3 (GAUZE/BANDAGES/DRESSINGS) ×3 IMPLANT
BUR SABER RD CUTTING 3.0 (BURR) ×3 IMPLANT
CANISTER SUCT 3000ML PPV (MISCELLANEOUS) ×3 IMPLANT
COVER MAYO STAND STRL (DRAPES) ×3 IMPLANT
COVER SURGICAL LIGHT HANDLE (MISCELLANEOUS) ×3 IMPLANT
DRAPE C-ARM 42X72 X-RAY (DRAPES) IMPLANT
DRAPE HALF SHEET 40X57 (DRAPES) IMPLANT
DRAPE MICROSCOPE LEICA (MISCELLANEOUS) ×3 IMPLANT
DRAPE SURG 17X23 STRL (DRAPES) ×10 IMPLANT
DRSG MEPILEX BORDER 4X4 (GAUZE/BANDAGES/DRESSINGS) IMPLANT
DRSG MEPILEX BORDER 4X8 (GAUZE/BANDAGES/DRESSINGS) IMPLANT
DURAPREP 26ML APPLICATOR (WOUND CARE) ×3 IMPLANT
ELECT BLADE 4.0 EZ CLEAN MEGAD (MISCELLANEOUS) ×2
ELECT CAUTERY BLADE 6.4 (BLADE) ×3 IMPLANT
ELECT REM PT RETURN 9FT ADLT (ELECTROSURGICAL) ×2
ELECTRODE BLDE 4.0 EZ CLN MEGD (MISCELLANEOUS) ×2 IMPLANT
ELECTRODE REM PT RTRN 9FT ADLT (ELECTROSURGICAL) ×2 IMPLANT
GLOVE BIOGEL PI IND STRL 8 (GLOVE) ×2 IMPLANT
GLOVE BIOGEL PI INDICATOR 8 (GLOVE)
GLOVE ECLIPSE 8.5 STRL (GLOVE) ×2 IMPLANT
GLOVE ORTHO TXT STRL SZ7.5 (GLOVE) ×3 IMPLANT
GLOVE SURG 8.5 LATEX PF (GLOVE) ×3 IMPLANT
GOWN STRL REUS W/ TWL LRG LVL3 (GOWN DISPOSABLE) ×2 IMPLANT
GOWN STRL REUS W/TWL 2XL LVL3 (GOWN DISPOSABLE) ×5 IMPLANT
GOWN STRL REUS W/TWL LRG LVL3 (GOWN DISPOSABLE)
KIT BASIN OR (CUSTOM PROCEDURE TRAY) ×3 IMPLANT
KIT TURNOVER KIT B (KITS) ×3 IMPLANT
NDL SPNL 18GX3.5 QUINCKE PK (NEEDLE) ×4 IMPLANT
NEEDLE SPNL 18GX3.5 QUINCKE PK (NEEDLE) ×4 IMPLANT
NS IRRIG 1000ML POUR BTL (IV SOLUTION) ×3 IMPLANT
PACK LAMINECTOMY ORTHO (CUSTOM PROCEDURE TRAY) ×3 IMPLANT
PAD ARMBOARD 7.5X6 YLW CONV (MISCELLANEOUS) ×6 IMPLANT
PATTIES SURGICAL .5 X.5 (GAUZE/BANDAGES/DRESSINGS) IMPLANT
PATTIES SURGICAL .75X.75 (GAUZE/BANDAGES/DRESSINGS) IMPLANT
PATTIES SURGICAL 1X1 (DISPOSABLE) ×1 IMPLANT
SPONGE SURGIFOAM ABS GEL 100 (HEMOSTASIS) ×3 IMPLANT
SPONGE T-LAP 4X18 ~~LOC~~+RFID (SPONGE) IMPLANT
STRIP CLOSURE SKIN 1/2X4 (GAUZE/BANDAGES/DRESSINGS) ×3 IMPLANT
SUT VIC AB 0 CT1 27 (SUTURE) ×2
SUT VIC AB 0 CT1 27XBRD ANBCTR (SUTURE) IMPLANT
SUT VIC AB 1 CTX 36 (SUTURE) ×2
SUT VIC AB 1 CTX36XBRD ANBCTR (SUTURE) ×2 IMPLANT
SUT VIC AB 2-0 CT1 27 (SUTURE) ×2
SUT VIC AB 2-0 CT1 TAPERPNT 27 (SUTURE) ×2 IMPLANT
SUT VIC AB 3-0 X1 27 (SUTURE) ×3 IMPLANT
SUT VICRYL 0 UR6 27IN ABS (SUTURE) ×3 IMPLANT
SYR 20ML LL LF (SYRINGE) ×3 IMPLANT
SYR CONTROL 10ML LL (SYRINGE) ×3 IMPLANT
TOWEL GREEN STERILE (TOWEL DISPOSABLE) ×3 IMPLANT
TOWEL GREEN STERILE FF (TOWEL DISPOSABLE) ×3 IMPLANT
WATER STERILE IRR 1000ML POUR (IV SOLUTION) ×3 IMPLANT

## 2021-09-20 NOTE — Brief Op Note (Signed)
09/20/2021  10:55 AM  PATIENT:  Darlene Crosby  56 y.o. female  PRE-OPERATIVE DIAGNOSIS:  lumbar spinal stenosis L3-4 and L4-5  POST-OPERATIVE DIAGNOSIS:  lumbar spinal stenosis L3-4 and L4-5  PROCEDURE:  Procedure(s): L3-4 AND L4-5 CENTRAL LUMBAR LAMINECTOMIES (N/A)  SURGEON:  Surgeon(s) and Role:    * Jessy Oto, MD - Primary  PHYSICIAN ASSISTANT: None  ASSISTANTS: Joey CRNA   ANESTHESIA:   local and general  EBL:  100 mL   BLOOD ADMINISTERED:none  DRAINS: Urinary Catheter (Foley)   LOCAL MEDICATIONS USED:  MARCAINE 0.5% 1:1 EXPAREL 1.3%  Amount: 30 ml  SPECIMEN:  No Specimen  DISPOSITION OF SPECIMEN:  N/A  COUNTS:  YES  TOURNIQUET:  * No tourniquets in log *  DICTATION: .Dragon Dictation  PLAN OF CARE: Admit to inpatient   PATIENT DISPOSITION:  PACU - hemodynamically stable.   Delay start of Pharmacological VTE agent (>24hrs) due to surgical blood loss or risk of bleeding: yes

## 2021-09-20 NOTE — Discharge Instructions (Signed)
   Ice to the lumbar area 30 min at a time avoid prolong exposure it delayes healing of skin edges. No lifting greater than 10 lbs. Avoid bending, stooping and twisting. Walk in house for first week them may start to get out slowly increasing distance up to one mile by 3 weeks post op. Keep incision dry for 3 days, may use tegaderm or similar water impervious dressing.

## 2021-09-20 NOTE — Transfer of Care (Signed)
Immediate Anesthesia Transfer of Care Note  Patient: Darlene Crosby  Procedure(s) Performed: L3-4 AND L4-5 CENTRAL LUMBAR LAMINECTOMIES (Spine Lumbar)  Patient Location: PACU  Anesthesia Type:General  Level of Consciousness: drowsy, patient cooperative and responds to stimulation  Airway & Oxygen Therapy: Patient Spontanous Breathing and Patient connected to face mask oxygen  Post-op Assessment: Report given to RN, Post -op Vital signs reviewed and stable and Patient moving all extremities X 4  Post vital signs: Reviewed and stable  Last Vitals:  Vitals Value Taken Time  BP 112/72 09/20/21 1126  Temp    Pulse 86 09/20/21 1128  Resp 24 09/20/21 1128  SpO2 97 % 09/20/21 1128  Vitals shown include unvalidated device data.  Last Pain:  Vitals:   09/20/21 0640  TempSrc:   PainSc: 0-No pain         Complications: No notable events documented.

## 2021-09-20 NOTE — H&P (Signed)
Darlene Crosby is an 56 y.o. female.   Chief Complaint: back pain and LE radiculopathy  HPI: 56 year old white female history of L3-4 and L4-5 stenosis, back pain and bilateral lower extremity radiculopathy comes in for preop evaluation.  States that symptoms unchanged from previous visit.  She is wanting to proceed with L3-4 AND L4-5 CENTRAL LUMBAR LAMINECTOMIES scheduled.  Today history physical performed.  Review of systems negative.    Past Medical History:  Diagnosis Date   Asthma    followed by pcp   Bronchitis    "chronic"   Hypertension    OA (osteoarthritis)    left knee, right shoulder, left hip   Pneumonia    "a couple years ago" per pt on 09/15/21   Seasonal allergies    Wears glasses     Past Surgical History:  Procedure Laterality Date   ANTERIOR HIP REVISION Left 06/06/2018   Procedure: LEFT HIP WOUND DEHISCENCE POSSIBLE HEAD AND LINER EXCHANGE;  Surgeon: Rod Can, MD;  Location: [facility];  Service: Orthopedics;  Laterality: Left;   COLONOSCOPY  2015   DIAGNOSTIC LAPAROSCOPY     tubal preg-took ovary and tube   LAPAROSCOPY FOR ECTOPIC PREGNANCY  1990s   LEFT SALPINGECTOMY AND RIGHT OOPHORECTOMY FOR ABNORMALITY   MASS EXCISION  02/15/2012   Procedure: EXCISION MASS;  Surgeon: Schuyler Amor, MD;  Location: [facility];  Service: Orthopedics;  Laterality: Right;  Excision of Right Small Volar Mass   MYELOGRAM     TONSILLECTOMY  age 54   TOTAL HIP ARTHROPLASTY Left 04/25/2018   Procedure: TOTAL HIP ARTHROPLASTY ANTERIOR APPROACH;  Surgeon: Rod Can, MD;  Location: [facility];  Service: Orthopedics;  Laterality: Left;   TRIGGER FINGER RELEASE Right 2018   thumb   WISDOM TOOTH EXTRACTION  age 64    Family History  Problem Relation Age of Onset   Hypertension Mother    Asthma Mother    Allergies Mother    Breast cancer Sister    Heart disease Sister    Hypertension Sister    Stroke Sister    Diabetes Sister    Colon cancer Neg Hx     Esophageal cancer Neg Hx    Stomach cancer Neg Hx    Pancreatic cancer Neg Hx    Social History:  reports that she has never smoked. She has never been exposed to tobacco smoke. She has never used smokeless tobacco. She reports current alcohol use of about 1.0 standard drink of alcohol per week. She reports that she does not use drugs.  Allergies:  Allergies  Allergen Reactions   Pineapple Shortness Of Breath, Swelling and Hives    Swelling of tongue    Chocolate Hives   Coconut (Cocos Nucifera) Hives and Rash   Strawberry Extract Hives   Wheat Bran Hives    No medications prior to admission.    No results found for this or any previous visit (from the past 48 hour(s)). No results found.  Review of Systems  Constitutional:  Positive for activity change.  HENT: Negative.    Respiratory: Negative.    Cardiovascular: Negative.   Gastrointestinal: Negative.   Genitourinary: Negative.   Musculoskeletal:  Positive for back pain.  Neurological:  Positive for numbness.    Last menstrual period 05/04/2012. Physical Exam HENT:     Head: Normocephalic and atraumatic.     Nose: Nose normal.  Eyes:     Extraocular Movements: Extraocular movements intact.  Cardiovascular:  Rate and Rhythm: Normal rate and regular rhythm.     Heart sounds: No murmur heard. Pulmonary:     Effort: Pulmonary effort is normal.     Breath sounds: Normal breath sounds.  Abdominal:     General: There is no distension.     Tenderness: There is no abdominal tenderness.  Neurological:     Mental Status: She is alert and oriented to person, place, and time.  Psychiatric:        Mood and Affect: Mood normal.      Assessment/Plan L3-4 and L4-5 HNP/stenosis   Proceed with surgery as scheduled.  Surgical procedure discussed along with potential rehab/recovery time.  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  I advised her that this absolutely will not be possible.  She can anticipate being out of work at least 4 to 6 weeks postop.  No driving for at least 6 weeks postop.  All questions answered.   Benjiman Core, PA-C 09/20/2021, 2:52 AM

## 2021-09-20 NOTE — Anesthesia Procedure Notes (Signed)
Procedure Name: Intubation Date/Time: 09/20/2021 8:08 AM  Performed by: Michele Rockers, CRNAPre-anesthesia Checklist: Patient identified, Patient being monitored, Timeout performed, Emergency Drugs available and Suction available Patient Re-evaluated:Patient Re-evaluated prior to induction Oxygen Delivery Method: Circle System Utilized Preoxygenation: Pre-oxygenation with 100% oxygen Induction Type: IV induction Ventilation: Mask ventilation without difficulty Laryngoscope Size: Miller and 2 Grade View: Grade I Tube type: Oral Tube size: 7.0 mm Number of attempts: 1 Airway Equipment and Method: Stylet Placement Confirmation: ETT inserted through vocal cords under direct vision, positive ETCO2 and breath sounds checked- equal and bilateral Secured at: 19 cm Tube secured with: Tape Dental Injury: Teeth and Oropharynx as per pre-operative assessment

## 2021-09-20 NOTE — Anesthesia Postprocedure Evaluation (Signed)
Anesthesia Post Note  Patient: Darlene Crosby  Procedure(s) Performed: L3-4 AND L4-5 CENTRAL LUMBAR LAMINECTOMIES (Spine Lumbar)     Patient location during evaluation: PACU Anesthesia Type: General Level of consciousness: awake and alert Pain management: pain level controlled Vital Signs Assessment: post-procedure vital signs reviewed and stable Respiratory status: spontaneous breathing, nonlabored ventilation, respiratory function stable and patient connected to nasal cannula oxygen Cardiovascular status: blood pressure returned to baseline and stable Postop Assessment: no apparent nausea or vomiting Anesthetic complications: no   No notable events documented.  Last Vitals:  Vitals:   09/20/21 1200 09/20/21 1224  BP: 111/76 117/68  Pulse: 90 81  Resp: 14 18  Temp:  36.6 C  SpO2: 92% 91%    Last Pain:  Vitals:   09/20/21 1224  TempSrc:   PainSc: 3                  Matea Stanard L Levette Paulick

## 2021-09-20 NOTE — Op Note (Signed)
09/20/2021  10:58 AM  PATIENT:  Darlene Crosby  56 y.o. female  MRN: [id number]  OPERATIVE REPORT  PRE-OPERATIVE DIAGNOSIS:  lumbar spinal stenosis L3-4 and L4-5  POST-OPERATIVE DIAGNOSIS:  lumbar spinal stenosis L3-4 and L4-5  PROCEDURE:  Procedure(s): L3-4 AND L4-5 CENTRAL LUMBAR LAMINECTOMIES    SURGEON:  Jessy Oto, MD     ASSISTANT:  Benjiman Core, PA-C  (Present throughout the entire procedure and necessary for completion of procedure in a timely manner)     ANESTHESIA:  General,supplemented with local marcaine 0.5% 1:1 exparel 1.3% total 30cc.    COMPLICATIONS:  None.     COMPONENTS: None  EBL 100CC  DRAINS: Foley to SD, discontinued at the end of the case.  PROCEDURE: The patient was met in the holding area, and the appropriate L3-4 to L4-5 levels identified and not marked with an "X" and my initials since this was a bilateral procedure. The patient was then transported to OR and was placed under general anesthesia without difficulty. Then placed on the operative table in a prone position. The [facility] spine OR table was used. The patient received appropriate preoperative antibiotic prophylaxis. Nursing staff inserted a Foley catheter under sterile conditions prior to turning. Standard prep with DuraPrep solution from the mid dorsal spine to the mid sacral level.Time-out procedure was called and correct .  Patient was draped in the usual manner. Iodine Vi-Drape was used. 2 x 18-gauge spinal needles were placed at the expected and of the expected levels. Intraoperative AP radiograph demonstrated the lower needle placed posterior to the L4-5 level and just to the left of the spinous process. Initial incisions were made at the expected L2 and at the lowest aspect of the incision at the expected level of L5 approximately 3 1/2inches in length. The incision were infiltrated with marcaine 1/2% plain:exparel 1.3% 1:1. Total of 30 cc were used. Skin subcutaneous layers divide down to  the lumbodorsal fascia this was incised in both L2, L3 and L4 spinous processes, clamps placed at the interspinous process at L4-5. A lateral radiograph demonstrated the Kocher clamp at the L4-5 interspinous space. The L3-4 to L4-5 levels were then exposed using Cobb elevators and electrocautery. These areas were then packed. Boss McCollough retractor was inserted at the incision site. Leksell rongeur used to remove a portion of the inferior aspect is process of L2 20% and the entire spinous processes of L3 and of L4. A small amount to the L5 spinous process was rescected. Leksell rongeur was then used to further remove bone down to the ligamentum flavum and the central portions of the lamina and base of the spinous process were thinned. 4 mm burr was also used to thin the lamina of L3 and L4 centrally. The facets were then exposed out laterally and were hypertrophic.Osteotomes then used medial aspect of the L3-4 facet and L4-5 facets bilaterally resecting approximately 10-15% of the facet bilaterally. Kerrisons were then used to resect central portions of the lamina of L4 and the entire central L3 lamina. Ligamentum flavum at the L2-3 L3-4 and L4-5 levels were then carefully resected centrally preserving at least 7-8 mm with at the pars level L4 and L3. The central laminectomy was further widened performing more resection of the medial aspect of facet at both L3-4 and L4-5. Note that loupe magnification and headlight was used for this portion procedure.the central portions of the ligamentum flavum at the L2-3 level were resected and the medial aspect of the L2-3 facet resected  over about 5-10%. Hypertrophic flava was found to be present. The operating room microscope sterilely draped brought into the field and then carefully the right side decompressed at each level beginning at the right L5 neuroforamen resect bone over the superior lamina of L5 and decompressing the right L5 foramen and reflected portion  ligamentum flavum off the medial aspect of the right L3-4 facet. Hockey-stick nerve probe could be passed out both the L4 and L5 neuroforamen. The medial aspect of facet at the L3-4 level was then carefully evaluated and hypertrophic ligamentum flavum resected using Kerrisons decompressing the lateral recess on this right side. This was done such that hockey-stick nerve probe could be used to pass the outer recess demonstrating patency and decompression of the L4 nerve root and L5 nerve roots. Attention then turned to the L2-3 were further debridement of the lateral recess and hypertrophic ligamentum flavum and reflected portions of ligamentum flavum were carried out. Changing sides of the OR table in decompression was carried out in similar fashion on the left side from the left L5 nerve root to the L2-3 level. At the left L3-4 level severe lateral recess stenosis was noted lateral recess was decompressed and the L4 nerve root freed up.  Further decompression was then carried out the left side of the spinal canal decompressing the L 4, the L3 and the L2 and of of a nerve roots. Following this then a hockey-stick nerve probe could be passed out easily bilateral L2, L3, L4 and L5  neuroforamen. Following decompression bilaterally thrombin-soaked Gelfoam was applied to the laminotomy defects and these areas packed with small sponges. Adequate hemostasis was obtained at all levels. The thrombin-soaked Gelfoam was then removed. Boss McCollough retractor was then removed. The end of the case, an approximately 15 mm central laminotomy was present with normal pulsation of the thecal sac present. Following additional irrigation and with no active bleeding present at any level level, the incision was then closed by first approximating the lumbar muscles the midline with interrupted #1 Vicryl sutures loosely the lumbodorsal fascia then approximated with interrupted #1 Vicryl sutures. Deep subcutaneous layers approximated with  interrupted #0 Vicryl suture more superficial layers with interrupted 2-0 Vicryl suture the skin closed with a running subcutaneous stitch of 4-0 monocryl. Steristrips applied to the skin margins.  A mepilex bandage was applied to the midline incision site and tegaderm to the hemovac drain exit site. All instrument and sponge counts were correct. Patient was then reactivated returned to supine position and extubated. He was then returned to recovery room in satisfactory condition.   Benjiman Core, PA-C performed the duties of assistant surgeon throughout this case she assisted using loupe magnification and OR microscope retracting delicate neural structures suctioning over the neural structures throughout the case bilaterally . He was present from the beginning of the case to the end of the case. Assisted in positioning the patient and  the arm and legs. He also participated in removal the patient from the operating table.    [name]  09/20/2021, 10:58 AM

## 2021-09-20 NOTE — Interval H&P Note (Signed)
History and Physical Interval Note:  09/20/2021 7:41 AM  Darlene Crosby  has presented today for surgery, with the diagnosis of lumbar spinal stenosis L3-4 and L4-5.  The various methods of treatment have been discussed with the patient and family. After consideration of risks, benefits and other options for treatment, the patient has consented to  Procedure(s): L3-4 AND L4-5 CENTRAL LUMBAR LAMINECTOMIES (N/A) as a surgical intervention.  The patient's history has been reviewed, patient examined, no change in status, stable for surgery.  I have reviewed the patient's chart and labs.  Questions were answered to the patient's satisfaction.     [name]

## 2021-09-21 ENCOUNTER — Other Ambulatory Visit: Payer: Self-pay | Admitting: Specialist

## 2021-09-21 ENCOUNTER — Encounter (HOSPITAL_COMMUNITY): Payer: Self-pay | Admitting: Specialist

## 2021-09-21 DIAGNOSIS — M48061 Spinal stenosis, lumbar region without neurogenic claudication: Secondary | ICD-10-CM | POA: Diagnosis not present

## 2021-09-21 LAB — CBC
HCT: 34.5 % — ABNORMAL LOW (ref 36.0–46.0)
Hemoglobin: 11.9 g/dL — ABNORMAL LOW (ref 12.0–15.0)
MCH: 28.5 pg (ref 26.0–34.0)
MCHC: 34.5 g/dL (ref 30.0–36.0)
MCV: 82.5 fL (ref 80.0–100.0)
Platelets: 266 K/uL (ref 150–400)
RBC: 4.18 MIL/uL (ref 3.87–5.11)
RDW: 13.8 % (ref 11.5–15.5)
WBC: 13.3 K/uL — ABNORMAL HIGH (ref 4.0–10.5)
nRBC: 0 % (ref 0.0–0.2)

## 2021-09-21 MED ORDER — HYDROCODONE-ACETAMINOPHEN 7.5-325 MG PO TABS: 1.0000 | ORAL_TABLET | Freq: Four times a day (QID) | ORAL | 0 refills | Status: DC | PRN

## 2021-09-21 MED ORDER — DOCUSATE SODIUM 100 MG PO CAPS: 100.0000 mg | ORAL_CAPSULE | Freq: Two times a day (BID) | ORAL | 0 refills | Status: DC

## 2021-09-21 MED ORDER — METHOCARBAMOL 500 MG PO TABS: 500.0000 mg | ORAL_TABLET | Freq: Four times a day (QID) | ORAL | 1 refills | Status: DC | PRN

## 2021-09-21 NOTE — Progress Notes (Signed)
Patient alert and oriented, mae's well, voiding adequate amount of urine, swallowing without difficulty, no c/o pain at time of discharge. Patient discharged home with family. Script and discharged instructions given to patient. Patient and family stated understanding of instructions given. Patient has an appointment with Dr. Louanne Skye in 2 weeks

## 2021-09-21 NOTE — Evaluation (Signed)
Occupational Therapy Evaluation Patient Details Name: Darlene Crosby MRN: [id number] DOB: 20-Jan-1966 Today's Date: 09/21/2021   History of Present Illness 56 year old white female s/p 09/20/21 L3-4 AND L4-5 CENTRAL LUMBAR LAMINECTOMIES  Hx of L3-4 and L4-5 stenosis, back pain and bilateral lower extremity radiculopathy   Clinical Impression   PTA patient independent and driving, using cane as needed.  Patient educated on back precautions, ADL compensatory techniques, DME, recommendations and activity progression.  She completes ADLs with modified independence after education, demonstrating good recall and adherence to back precautions. She has good support at home with grandson planning to assist as needed for the first few weeks.  Discussed safety, plans to basin bathe as her shower is broken and knows not to get down into the tub.  Based on performance today, no further OT needs identified and OT will sign off. Thank you for this referral!      Recommendations for follow up therapy are one component of a multi-disciplinary discharge planning process, led by the attending physician.  Recommendations may be updated based on patient status, additional functional criteria and insurance authorization.   Follow Up Recommendations  No OT follow up    Assistance Recommended at Discharge PRN  Patient can return home with the following Assistance with cooking/housework;Assist for transportation    Functional Status Assessment  Patient has had a recent decline in their functional status and demonstrates the ability to make significant improvements in function in a reasonable and predictable amount of time.  Equipment Recommendations  None recommended by OT    Recommendations for Other Services       Precautions / Restrictions Precautions Precautions: Back Precaution Booklet Issued: Yes (comment) Precaution Comments: reviewed with pt Required Braces or Orthoses:  (None) Restrictions Weight  Bearing Restrictions: No      Mobility Bed Mobility Overal bed mobility: Modified Independent             General bed mobility comments: cues for log rolling instead of twisting, to get LE in and out of bed    Transfers Overall transfer level: Modified independent                        Balance Overall balance assessment: Needs assistance Sitting-balance support: Feet supported, No upper extremity supported Sitting balance-Leahy Scale: Normal     Standing balance support: During functional activity, No upper extremity supported Standing balance-Leahy Scale: Good                             ADL either performed or assessed with clinical judgement   ADL Overall ADL's : Modified independent                                     Functional mobility during ADLs: Modified independent General ADL Comments: using cane for mobility, completing ADLs with modified independence after education on back preactuions and compensatory techniques.     Vision   Vision Assessment?: No apparent visual deficits     Perception     Praxis      Pertinent Vitals/Pain Pain Assessment Pain Assessment: No/denies pain     Hand Dominance Right   Extremity/Trunk Assessment Upper Extremity Assessment Upper Extremity Assessment: Overall WFL for tasks assessed   Lower Extremity Assessment Lower Extremity Assessment: Defer to PT evaluation   Cervical / Trunk Assessment  Cervical / Trunk Assessment: Back Surgery   Communication Communication Communication: No difficulties   Cognition Arousal/Alertness: Awake/alert Behavior During Therapy: WFL for tasks assessed/performed Overall Cognitive Status: Within Functional Limits for tasks assessed                                       General Comments  VSS on RA    Exercises     Shoulder Instructions      Home Living Family/patient expects to be discharged to:: Private  residence Living Arrangements: Alone Available Help at Discharge: Family;Available 24 hours/day (grandson to stay with her for 2 weeks) Type of Home: House Home Access: Stairs to enter [employer] of Steps: 1 Entrance Stairs-Rails: None Home Layout: One level     Bathroom Shower/Tub: Teacher, early years/pre: Standard Bathroom Accessibility: Yes   Home Equipment: Cane - single Barista (2 wheels);BSC/3in1   Additional Comments: reports shower is broken and plans to complete basin baths at dc      Prior Functioning/Environment Prior Level of Function : Independent/Modified Independent             Mobility Comments: ambulating very slowly with cane ADLs Comments: independent with ADLs and iADLs, working part time in security        OT Problem List: Decreased knowledge of use of DME or AE;Decreased knowledge of precautions;Decreased safety awareness;Impaired balance (sitting and/or standing);Obesity      OT Treatment/Interventions: Self-care/ADL training;DME and/or AE instruction;Therapeutic activities;Patient/family education    OT Goals(Current goals can be found in the care plan section) Acute Rehab OT Goals Patient Stated Goal: home OT Goal Formulation: With patient  OT Frequency: Min 2X/week    Co-evaluation              AM-PAC OT "6 Clicks" Daily Activity     Outcome Measure Help from another person eating meals?: None Help from another person taking care of personal grooming?: None Help from another person toileting, which includes using toliet, bedpan, or urinal?: None Help from another person bathing (including washing, rinsing, drying)?: None Help from another person to put on and taking off regular upper body clothing?: None Help from another person to put on and taking off regular lower body clothing?: None 6 Click Score: 24   End of Session Equipment Utilized During Treatment: Other (comment) (cane) Nurse  Communication: Mobility status  Activity Tolerance: Patient tolerated treatment well Patient left: Other (comment);with call bell/phone within reach (EOB)  OT Visit Diagnosis: Unsteadiness on feet (R26.81)                Time: 7824-2353 OT Time Calculation (min): 16 min Charges:  OT General Charges $OT Visit: 1 Visit OT Evaluation $OT Eval Low Complexity: 1 Low OT Treatments $Self Care/Home Management : 8-22 mins  Jolaine Artist, OT Acute Rehabilitation Services Office [phone]   Delight Stare 09/21/2021, 10:23 AM

## 2021-09-21 NOTE — Progress Notes (Signed)
     Subjective: 1 Day Post-Op Procedure(s) (LRB): L3-4 AND L4-5 CENTRAL LUMBAR LAMINECTOMIES (N/A) Awake, alert and oriented x 4, flatus. Stood and walked 3 times in the hallway. Tolerating pos  Nourshment and meds.  Patient reports pain as moderate.    Objective:   VITALS:  Temp:  [97.9 F (36.6 C)-98.5 F (36.9 C)] 97.9 F (36.6 C) (07/25 0719) Pulse Rate:  [81-93] 90 (07/25 0719) Resp:  [14-26] 18 (07/25 0719) BP: (105-151)/(63-91) 151/91 (07/25 0719) SpO2:  [91 %-97 %] 96 % (07/25 0719)  Neurologically intact ABD soft Neurovascular intact Sensation intact distally Intact pulses distally Dorsiflexion/Plantar flexion intact Incision: dressing C/D/I and scant drainage Compartment soft   LABS Recent Labs    09/21/21 0458  HGB 11.9*  WBC 13.3*  PLT 266   No results for input(s): "NA", "K", "CL", "CO2", "BUN", "CREATININE", "GLUCOSE" in the last 72 hours. No results for input(s): "LABPT", "INR" in the last 72 hours.   Assessment/Plan: 1 Day Post-Op Procedure(s) (LRB): L3-4 AND L4-5 CENTRAL LUMBAR LAMINECTOMIES (N/A)  Advance diet Up with therapy D/C IV fluids Discharge home with home health  [name] 09/21/2021, 7:54 AM Patient ID: Ulyses Jarred, female   DOB: [birth year], 56 y.o.   MRN: [id number]

## 2021-09-21 NOTE — Evaluation (Signed)
Physical Therapy Evaluation and Discharge Patient Details Name: Darlene Crosby MRN: [id number] DOB: [birth year] Today's Date: 09/21/2021  History of Present Illness  56 year old white female s/p 09/20/21 L3-4 AND L4-5 CENTRAL LUMBAR LAMINECTOMIES  Hx of L3-4 and L4-5 stenosis, back pain and bilateral lower extremity radiculopathy  Clinical Impression  PTA pt living alone in single story home with one step to enter. Pt reports using cane or RW for ambulation and independence in ADLs and iADLs. Pt is currently performing all mobility at a supervisory level including stepping up and down step. Pt does not need and additional PT services at this time. PT discharging from service.      Recommendations for follow up therapy are one component of a multi-disciplinary discharge planning process, led by the attending physician.  Recommendations may be updated based on patient status, additional functional criteria and insurance authorization.  Follow Up Recommendations Follow physician's recommendations for discharge plan and follow up therapies      Assistance Recommended at Discharge Intermittent Supervision/Assistance  Patient can return home with the following  A little help with walking and/or transfers;A little help with bathing/dressing/bathroom;Assistance with cooking/housework;Assist for transportation;Help with stairs or ramp for entrance    Equipment Recommendations None recommended by PT     Functional Status Assessment Patient has not had a recent decline in their functional status     Precautions / Restrictions Precautions Precautions: Back Precaution Booklet Issued: Yes (comment) Required Braces or Orthoses:  (None) Restrictions Weight Bearing Restrictions: No      Mobility  Bed Mobility Overal bed mobility: Modified Independent             General bed mobility comments: cues for log rolling instead of twisting, to get LE in and out of bed    Transfers Overall  transfer level: Needs assistance Equipment used: None Transfers: Sit to/from Stand Sit to Stand: Supervision           General transfer comment: supervision for safety, good power up and self steadying    Ambulation/Gait Ambulation/Gait assistance: Supervision Gait Distance (Feet): 450 Feet Assistive device: Straight cane Gait Pattern/deviations: Step-through pattern, Decreased step length - right, Decreased step length - left Gait velocity: slowed Gait velocity interpretation: <1.8 ft/sec, indicate of risk for recurrent falls   General Gait Details: supervision for safety with slowed, steady gait, pt remarks she is happy with how upright her gait is today  Stairs Stairs: Yes Stairs assistance: Supervision Stair Management: One rail Left, With cane Number of Stairs: 1 General stair comments: step up and down one step with cane and rail      Balance Overall balance assessment: Needs assistance Sitting-balance support: Feet supported, No upper extremity supported Sitting balance-Leahy Scale: Normal     Standing balance support: During functional activity, No upper extremity supported Standing balance-Leahy Scale: Good                               Pertinent Vitals/Pain Pain Assessment Pain Assessment: No/denies pain    Home Living Family/patient expects to be discharged to:: Private residence Living Arrangements: Alone Available Help at Discharge: Family;Available 24 hours/day (grandson to stay with her for 2 weeks) Type of Home: House Home Access: Stairs to enter Entrance Stairs-Rails: None Entrance Stairs-Number of Steps: 1   Home Layout: One level Home Equipment: Cane - single Barista (2 wheels);BSC/3in1      Prior Function Prior Level of Function : Independent/Modified  Independent             Mobility Comments: ambulating very slowly with cane ADLs Comments: independent with ADLs and iADLs     Hand Dominance   Dominant  Hand: Right    Extremity/Trunk Assessment   Upper Extremity Assessment Upper Extremity Assessment: Defer to OT evaluation    Lower Extremity Assessment Lower Extremity Assessment: Generalized weakness (pt reports increased strength and no numbness and tingling)       Communication   Communication: No difficulties  Cognition Arousal/Alertness: Awake/alert Behavior During Therapy: WFL for tasks assessed/performed Overall Cognitive Status: Within Functional Limits for tasks assessed                                          General Comments General comments (skin integrity, edema, etc.): VSS on RA        Assessment/Plan    PT Assessment Patient does not need any further PT services         PT Goals (Current goals can be found in the Care Plan section)  Acute Rehab PT Goals Patient Stated Goal: walk better PT Goal Formulation: With patient Time For Goal Achievement: 10/05/21 Potential to Achieve Goals: Good     AM-PAC PT "6 Clicks" Mobility  Outcome Measure Help needed turning from your back to your side while in a flat bed without using bedrails?: None Help needed moving from lying on your back to sitting on the side of a flat bed without using bedrails?: None Help needed moving to and from a bed to a chair (including a wheelchair)?: None Help needed standing up from a chair using your arms (e.g., wheelchair or bedside chair)?: None Help needed to walk in hospital room?: None Help needed climbing 3-5 steps with a railing? : A Little 6 Click Score: 23    End of Session Equipment Utilized During Treatment: Gait belt Activity Tolerance: Patient tolerated treatment well Patient left: in bed;with call bell/phone within reach Nurse Communication: Mobility status PT Visit Diagnosis: Muscle weakness (generalized) (M62.81)    Time: 7096-2836 PT Time Calculation (min) (ACUTE ONLY): 20 min   Charges:   PT Evaluation $PT Eval Low Complexity: 1 Low           Kalifa Cadden B. Migdalia Dk PT, DPT Acute Rehabilitation Services Please use secure chat or  Call Office [phone]   [place] 09/21/2021, 9:24 AM

## 2021-09-21 NOTE — Plan of Care (Signed)

## 2021-09-23 ENCOUNTER — Ambulatory Visit: Payer: 59 | Admitting: Family

## 2021-09-24 ENCOUNTER — Telehealth: Payer: Self-pay | Admitting: Specialist

## 2021-09-24 NOTE — Telephone Encounter (Signed)
I called and advised that I do remember getting something and ending it to Dr. Louanne Skye, but I do  not know where it is now. She is going to email the paper to me and I will get him to address it next week.

## 2021-09-24 NOTE — Telephone Encounter (Signed)
Pt called stating Christy and Dr. Louanne Skye was suppose to fill some paperwork out for her lawyer and she has been waiting for a call. Please call pt about this matter at [phone]

## 2021-10-01 ENCOUNTER — Encounter: Payer: Self-pay | Admitting: Surgery

## 2021-10-01 ENCOUNTER — Ambulatory Visit (INDEPENDENT_AMBULATORY_CARE_PROVIDER_SITE_OTHER): Payer: Commercial Managed Care - HMO | Admitting: Surgery

## 2021-10-01 VITALS — BP 151/93 | HR 69 | Ht 65.0 in | Wt 230.0 lb

## 2021-10-01 DIAGNOSIS — Z9889 Other specified postprocedural states: Secondary | ICD-10-CM

## 2021-10-01 NOTE — Progress Notes (Signed)
56 year old black female who is 2 weeks status post L3-4 and L4-5 central laminectomies returns.  States that she is doing well.   Exam Pleasant female alert and oriented in no acute distress.  Wound looks good.  Steri-Strips applied.  No drainage or signs of infection.   Plan Patient will gradually increase her walking distances over the next several weeks.  No aggressive activity.  Follow-up with Dr. Louanne Skye in 4 weeks for recheck.  Return sooner if needed.

## 2021-10-04 NOTE — Progress Notes (Signed)
Patient ID: Darlene Crosby, female    DOB: [birth year]  MRN: [id number]  CC: Chronic Care Management   Subjective: Darlene Crosby is a 56 y.o. female who presents for chronic care management.   Her concerns today include:  Doing well on chronic medications without issues or concerns.   Patient Active Problem List   Diagnosis Date Noted   Status post lumbar laminectomy 09/20/2021   Body mass index (BMI) 37.0-37.9, adult 03/14/2019   Lumbar stenosis without neurogenic claudication 03/14/2019   Hyperlipidemia 02/08/2019   Lumbar spondylosis 10/25/2018   Degeneration of lumbar intervertebral disc 10/01/2018   Surgical wound dehiscence 06/06/2018   Osteoarthritis of left hip 04/25/2018   Spinal stenosis of lumbar region 02/23/2018   Pain in left knee 01/10/2018   Trigger thumb of right hand 03/30/2017   Dyspnea 03/26/2013   Asthma vs VCD  02/28/2013   Hypertensive disorder 06/07/2012   Pyogenic granuloma 02/15/2012     Current Outpatient Medications on File Prior to Visit  Medication Sig Dispense Refill   acetaminophen (TYLENOL) 650 MG CR tablet Take 1,300 mg by mouth every 8 (eight) hours as needed for pain.     albuterol (VENTOLIN HFA) 108 (90 Base) MCG/ACT inhaler Inhale 2 puffs into the lungs every 4 (four) hours as needed (wheezing and SOB).      Ascorbic Acid (VITAMIN C PO) Take 1 tablet by mouth daily.     benzonatate (TESSALON) 100 MG capsule Take 1 capsule (100 mg total) by mouth 3 (three) times daily as needed for cough. (Patient not taking: Reported on 09/10/2021) 20 capsule 2   Calcium-Magnesium-Zinc (CAL-MAG-ZINC PO) Take 1 tablet by mouth daily.     Cholecalciferol (VITAMIN D) 50 MCG (2000 UT) tablet Take 2,000 Units by mouth daily.     diclofenac (VOLTAREN) 50 MG EC tablet Take 1 tablet (50 mg total) by mouth 3 (three) times daily. 90 tablet 2   docusate sodium (COLACE) 100 MG capsule Take 1 capsule (100 mg total) by mouth 2 (two) times daily. 40 capsule 0    fluticasone (FLONASE) 50 MCG/ACT nasal spray Place 1 spray into both nostrils daily.     HYDROcodone-acetaminophen (NORCO) 7.5-325 MG tablet Take 1 tablet by mouth every 4 (four) hours as needed for moderate pain. 40 tablet 0   ipratropium-albuterol (DUONEB) 0.5-2.5 (3) MG/3ML SOLN Inhale 3 mLs into the lungs every 6 (six) hours as needed for shortness of breath.     Menthol, Topical Analgesic, (BIOFREEZE EX) Apply 1 Application topically daily as needed (pain).     methocarbamol (ROBAXIN) 500 MG tablet Take 1 tablet (500 mg total) by mouth every 6 (six) hours as needed for muscle spasms. 30 tablet 1   pregabalin (LYRICA) 50 MG capsule Take 1 capsule (50 mg total) by mouth 2 (two) times daily. 60 capsule 2   Probiotic Product (PROBIOTIC PO) Take 1 capsule by mouth daily.     Turmeric 500 MG CAPS Take 500 mg by mouth daily.     vitamin B-12 (CYANOCOBALAMIN) 1000 MCG tablet Take 1,000 mcg by mouth daily.     No current facility-administered medications on file prior to visit.    Allergies  Allergen Reactions   Pineapple Shortness Of Breath, Swelling and Hives    Swelling of tongue    Chocolate Hives   Coconut (Cocos Nucifera) Hives and Rash   Strawberry Extract Hives   Wheat Bran Hives    Social History   Socioeconomic History   Marital  status: Widowed    Spouse name: Not on file   Number of children: Not on file   Years of education: Not on file   Highest education level: Not on file  Occupational History   Not on file  Tobacco Use   Smoking status: Never    Passive exposure: Never   Smokeless tobacco: Never  Vaping Use   Vaping Use: Never used  Substance and Sexual Activity   Alcohol use: Yes    Alcohol/week: 1.0 standard drink of alcohol    Types: 1 Glasses of wine per week   Drug use: Never   Sexual activity: Not on file  Other Topics Concern   Not on file  Social History Narrative   Not on file   Social Determinants of Health   Financial Resource Strain: Not on  file  Food Insecurity: Not on file  Transportation Needs: Not on file  Physical Activity: Not on file  Stress: Not on file  Social Connections: Not on file  Intimate Partner Violence: Not on file    Family History  Problem Relation Age of Onset   Hypertension Mother    Asthma Mother    Allergies Mother    Breast cancer Sister    Heart disease Sister    Hypertension Sister    Stroke Sister    Diabetes Sister    Colon cancer Neg Hx    Esophageal cancer Neg Hx    Stomach cancer Neg Hx    Pancreatic cancer Neg Hx     Past Surgical History:  Procedure Laterality Date   ANTERIOR HIP REVISION Left 06/06/2018   Procedure: LEFT HIP WOUND DEHISCENCE POSSIBLE HEAD AND LINER EXCHANGE;  Surgeon: Rod Can, MD;  Location: [facility];  Service: Orthopedics;  Laterality: Left;   COLONOSCOPY  2015   DIAGNOSTIC LAPAROSCOPY     tubal preg-took ovary and tube   LAPAROSCOPY FOR ECTOPIC PREGNANCY  1990s   LEFT SALPINGECTOMY AND RIGHT OOPHORECTOMY FOR ABNORMALITY   LUMBAR LAMINECTOMY N/A 09/20/2021   Procedure: L3-4 AND L4-5 CENTRAL LUMBAR LAMINECTOMIES;  Surgeon: Jessy Oto, MD;  Location: [facility];  Service: Orthopedics;  Laterality: N/A;   MASS EXCISION  02/15/2012   Procedure: EXCISION MASS;  Surgeon: Schuyler Amor, MD;  Location: [facility];  Service: Orthopedics;  Laterality: Right;  Excision of Right Small Volar Mass   MYELOGRAM     TONSILLECTOMY  age 56   TOTAL HIP ARTHROPLASTY Left 04/25/2018   Procedure: TOTAL HIP ARTHROPLASTY ANTERIOR APPROACH;  Surgeon: Rod Can, MD;  Location: [facility];  Service: Orthopedics;  Laterality: Left;   TRIGGER FINGER RELEASE Right 2018   thumb   WISDOM TOOTH EXTRACTION  age 66    ROS: Review of Systems Negative except as stated above  PHYSICAL EXAM: BP 125/78 (BP Location: Left Arm, Patient Position: Sitting, Cuff Size: Large)   Pulse 77   Temp 98.3 F (36.8 C)   Resp 16   Ht '5\' 5"'$  (1.651 m)   Wt 230 lb (104.3 kg)    LMP 05/04/2012   SpO2 95%   BMI 38.27 kg/m   Physical Exam HENT:     Head: Normocephalic and atraumatic.  Eyes:     Extraocular Movements: Extraocular movements intact.     Conjunctiva/sclera: Conjunctivae normal.     Pupils: Pupils are equal, round, and reactive to light.  Cardiovascular:     Rate and Rhythm: Normal rate and regular rhythm.     Pulses: Normal  pulses.     Heart sounds: Normal heart sounds.  Pulmonary:     Effort: Pulmonary effort is normal.     Breath sounds: Normal breath sounds.  Musculoskeletal:     Cervical back: Normal range of motion and neck supple.  Neurological:     General: No focal deficit present.     Mental Status: She is alert and oriented to person, place, and time.  Psychiatric:        Mood and Affect: Mood normal.        Behavior: Behavior normal.    Results for orders placed or performed in visit on 10/12/21  POCT glycosylated hemoglobin (Hb A1C)  Result Value Ref Range   Hemoglobin A1C 6.1 (A) 4.0 - 5.6 %   HbA1c POC (<> result, manual entry)     HbA1c, POC (prediabetic range)     HbA1c, POC (controlled diabetic range)      ASSESSMENT AND PLAN: 1. Essential (primary) hypertension - Continue Amlodipine as prescribed.  - Counseled on blood pressure goal of less than 130/80, low-sodium, DASH diet, medication compliance, and 150 minutes of moderate intensity exercise per week as tolerated. Counseled on medication adherence and adverse effects. - Follow-up with primary provider in 6 months or sooner if needed.  - amLODipine (NORVASC) 5 MG tablet; Take 1 tablet (5 mg total) by mouth daily.  Dispense: 30 tablet; Refill: 5  2. Hyperlipidemia, unspecified hyperlipidemia type - Continue Rosuvastatin as prescribed.  - Update lipid panel.  - Follow-up with primary provider as scheduled.  - Lipid Panel - rosuvastatin (CRESTOR) 20 MG tablet; Take 1 tablet (20 mg total) by mouth at bedtime.  Dispense: 30 tablet; Refill: 5  3. Prediabetes -  Hemoglobin A1c 6.1% and remaining consistent with prediabetes.  - Recheck in 6 months.  - POCT glycosylated hemoglobin (Hb A1C)  4. Perennial allergic rhinitis - Continue Levocetirizine as prescribed.  - Follow-up with primary provider as scheduled.  - levocetirizine (XYZAL) 5 MG tablet; Take 1 tablet (5 mg total) by mouth every evening.  Dispense: 30 tablet; Refill: 5   Patient was given the opportunity to ask questions.  Patient verbalized understanding of the plan and was able to repeat key elements of the plan. Patient was given clear instructions to go to Emergency Department or return to medical center if symptoms don't improve, worsen, or new problems develop.The patient verbalized understanding.   Orders Placed This Encounter  Procedures   Lipid Panel   POCT glycosylated hemoglobin (Hb A1C)     Requested Prescriptions   Signed Prescriptions Disp Refills   amLODipine (NORVASC) 5 MG tablet 30 tablet 5    Sig: Take 1 tablet (5 mg total) by mouth daily.   rosuvastatin (CRESTOR) 20 MG tablet 30 tablet 5    Sig: Take 1 tablet (20 mg total) by mouth at bedtime.   levocetirizine (XYZAL) 5 MG tablet 30 tablet 5    Sig: Take 1 tablet (5 mg total) by mouth every evening.    Return in about 6 months (around 04/14/2022) for Follow-Up or next available chronic care mgmt.  Camillia Herter, NP

## 2021-10-12 ENCOUNTER — Ambulatory Visit (INDEPENDENT_AMBULATORY_CARE_PROVIDER_SITE_OTHER): Payer: Commercial Managed Care - HMO | Admitting: Family

## 2021-10-12 ENCOUNTER — Encounter: Payer: Self-pay | Admitting: Family

## 2021-10-12 VITALS — BP 125/78 | HR 77 | Temp 98.3°F | Resp 16 | Ht 65.0 in | Wt 230.0 lb

## 2021-10-12 DIAGNOSIS — I10 Essential (primary) hypertension: Secondary | ICD-10-CM

## 2021-10-12 DIAGNOSIS — R7303 Prediabetes: Secondary | ICD-10-CM | POA: Diagnosis not present

## 2021-10-12 DIAGNOSIS — J3089 Other allergic rhinitis: Secondary | ICD-10-CM | POA: Diagnosis not present

## 2021-10-12 DIAGNOSIS — E785 Hyperlipidemia, unspecified: Secondary | ICD-10-CM | POA: Diagnosis not present

## 2021-10-12 LAB — POCT GLYCOSYLATED HEMOGLOBIN (HGB A1C): Hemoglobin A1C: 6.1 % — AB (ref 4.0–5.6)

## 2021-10-12 MED ORDER — ROSUVASTATIN CALCIUM 20 MG PO TABS: 20.0000 mg | ORAL_TABLET | Freq: Every day | ORAL | 5 refills | Status: DC

## 2021-10-12 MED ORDER — AMLODIPINE BESYLATE 5 MG PO TABS
5.0000 mg | ORAL_TABLET | Freq: Every day | ORAL | 5 refills | Status: DC
Start: 2021-10-12 — End: 2021-11-03

## 2021-10-12 MED ORDER — LEVOCETIRIZINE DIHYDROCHLORIDE 5 MG PO TABS
5.0000 mg | ORAL_TABLET | Freq: Every evening | ORAL | 5 refills | Status: DC
Start: 2021-10-12 — End: 2021-11-03

## 2021-10-12 NOTE — Progress Notes (Signed)
.  Pt presents for chronic care management   

## 2021-10-12 NOTE — Patient Instructions (Signed)
Mediterranean Diet ?A Mediterranean diet refers to food and lifestyle choices that are based on the traditions of countries located on the [place] Sea. It focuses on eating more fruits, vegetables, whole grains, beans, nuts, seeds, and heart-healthy fats, and eating less dairy, meat, eggs, and processed foods with added sugar, salt, and fat. This way of eating has been shown to help prevent certain conditions and improve outcomes for people who have chronic diseases, like kidney disease and heart disease. ?What are tips for following this plan? ?Reading food labels ?Check the serving size of packaged foods. For foods such as rice and pasta, the serving size refers to the amount of cooked product, not dry. ?Check the total fat in packaged foods. Avoid foods that have saturated fat or trans fats. ?Check the ingredient list for added sugars, such as corn syrup. ?Shopping ? ?Buy a variety of foods that offer a balanced diet, including: ?Fresh fruits and vegetables (produce). ?Grains, beans, nuts, and seeds. Some of these may be available in unpackaged forms or large amounts (in bulk). ?Fresh seafood. ?Poultry and eggs. ?Low-fat dairy products. ?Buy whole ingredients instead of prepackaged foods. ?Buy fresh fruits and vegetables in-season from local farmers markets. ?Buy plain frozen fruits and vegetables. ?If you do not have access to quality fresh seafood, buy precooked frozen shrimp or canned fish, such as tuna, salmon, or sardines. ?Stock your pantry so you always have certain foods on hand, such as olive oil, canned tuna, canned tomatoes, rice, pasta, and beans. ?Cooking ?Cook foods with extra-virgin olive oil instead of using butter or other vegetable oils. ?Have meat as a side dish, and have vegetables or grains as your main dish. This means having meat in small portions or adding small amounts of meat to foods like pasta or stew. ?Use beans or vegetables instead of meat in common dishes like chili or  lasagna. ?Experiment with different cooking methods. Try roasting, broiling, steaming, and saut?ing vegetables. ?Add frozen vegetables to soups, stews, pasta, or rice. ?Add nuts or seeds for added healthy fats and plant protein at each meal. You can add these to yogurt, salads, or vegetable dishes. ?Marinate fish or vegetables using olive oil, lemon juice, garlic, and fresh herbs. ?Meal planning ?Plan to eat one vegetarian meal one day each week. Try to work up to two vegetarian meals, if possible. ?Eat seafood two or more times a week. ?Have healthy snacks readily available, such as: ?Vegetable sticks with hummus. ?Greek yogurt. ?Fruit and nut trail mix. ?Eat balanced meals throughout the week. This includes: ?Fruit: 2-3 servings a day. ?Vegetables: 4-5 servings a day. ?Low-fat dairy: 2 servings a day. ?Fish, poultry, or lean meat: 1 serving a day. ?Beans and legumes: 2 or more servings a week. ?Nuts and seeds: 1-2 servings a day. ?Whole grains: 6-8 servings a day. ?Extra-virgin olive oil: 3-4 servings a day. ?Limit red meat and sweets to only a few servings a month. ?Lifestyle ? ?Cook and eat meals together with your family, when possible. ?Drink enough fluid to keep your urine pale yellow. ?Be physically active every day. This includes: ?Aerobic exercise like running or swimming. ?Leisure activities like gardening, walking, or housework. ?Get 7-8 hours of sleep each night. ?If recommended by your health care provider, drink red wine in moderation. This means 1 glass a day for nonpregnant women and 2 glasses a day for men. A glass of wine equals 5 oz (150 mL). ?What foods should I eat? ?Fruits ?Apples. Apricots. Avocado. Berries. Bananas. Cherries. Dates.   Figs. Grapes. Lemons. Melon. Oranges. Peaches. Plums. Pomegranate. ?Vegetables ?Artichokes. Beets. Broccoli. Cabbage. Carrots. Eggplant. Green beans. Chard. Kale. Spinach. Onions. Leeks. Peas. Squash. Tomatoes. Peppers. Radishes. ?Grains ?Whole-grain pasta. Brown  rice. Bulgur wheat. Polenta. Couscous. Whole-wheat bread. Oatmeal. Quinoa. ?Meats and other proteins ?Beans. Almonds. Sunflower seeds. Pine nuts. Peanuts. Cod. Salmon. Scallops. Shrimp. Tuna. Tilapia. Clams. Oysters. Eggs. Poultry without skin. ?Dairy ?Low-fat milk. Cheese. Greek yogurt. ?Fats and oils ?Extra-virgin olive oil. Avocado oil. Grapeseed oil. ?Beverages ?Water. Red wine. Herbal tea. ?Sweets and desserts ?Greek yogurt with honey. Baked apples. Poached pears. Trail mix. ?Seasonings and condiments ?Basil. Cilantro. Coriander. Cumin. Mint. Parsley. Sage. Rosemary. Tarragon. Garlic. Oregano. Thyme. Pepper. Balsamic vinegar. Tahini. Hummus. Tomato sauce. Olives. Mushrooms. ?The items listed above may not be a complete list of foods and beverages you can eat. Contact a dietitian for more information. ?What foods should I limit? ?This is a list of foods that should be eaten rarely or only on special occasions. ?Fruits ?Fruit canned in syrup. ?Vegetables ?Deep-fried potatoes (french fries). ?Grains ?Prepackaged pasta or rice dishes. Prepackaged cereal with added sugar. Prepackaged snacks with added sugar. ?Meats and other proteins ?Beef. Pork. Lamb. Poultry with skin. Hot dogs. Bacon. ?Dairy ?Ice cream. Sour cream. Whole milk. ?Fats and oils ?Butter. Canola oil. Vegetable oil. Beef fat (tallow). Lard. ?Beverages ?Juice. Sugar-sweetened soft drinks. Beer. Liquor and spirits. ?Sweets and desserts ?Cookies. Cakes. Pies. Candy. ?Seasonings and condiments ?Mayonnaise. Pre-made sauces and marinades. ?The items listed above may not be a complete list of foods and beverages you should limit. Contact a dietitian for more information. ?Summary ?The Mediterranean diet includes both food and lifestyle choices. ?Eat a variety of fresh fruits and vegetables, beans, nuts, seeds, and whole grains. ?Limit the amount of red meat and sweets that you eat. ?If recommended by your health care provider, drink red wine in moderation.  This means 1 glass a day for nonpregnant women and 2 glasses a day for men. A glass of wine equals 5 oz (150 mL). ?This information is not intended to replace advice given to you by your health care provider. Make sure you discuss any questions you have with your health care provider. ?Document Revised: 03/22/2019 Document Reviewed: 01/17/2019 ?Elsevier Patient Education ? 2023 [employer] ? ?

## 2021-10-13 LAB — LIPID PANEL
Chol/HDL Ratio: 4.7 ratio — ABNORMAL HIGH (ref 0.0–4.4)
Cholesterol, Total: 165 mg/dL (ref 100–199)
HDL: 35 mg/dL — ABNORMAL LOW (ref >39)
LDL Chol Calc (NIH): 104 mg/dL — ABNORMAL HIGH (ref 0–99)
Triglycerides: 143 mg/dL (ref 0–149)
VLDL Cholesterol Cal: 26 mg/dL (ref 5–40)

## 2021-10-22 ENCOUNTER — Other Ambulatory Visit: Payer: Self-pay | Admitting: Obstetrics and Gynecology

## 2021-10-22 NOTE — Discharge Summary (Signed)
Patient ID: Darlene Crosby MRN: [id number] DOB/AGE: 1965/11/23 56 y.o.  Admit date: 09/20/2021 Discharge date: 09/21/2021  Admission Diagnoses:  Principal Problem:   Lumbar spondylosis Active Problems:   Lumbar stenosis without neurogenic claudication   Status post lumbar laminectomy   Discharge Diagnoses:  Principal Problem:   Lumbar spondylosis Active Problems:   Lumbar stenosis without neurogenic claudication   Status post lumbar laminectomy  status post Procedure(s): L3-4 AND L4-5 CENTRAL LUMBAR LAMINECTOMIES  Past Medical History:  Diagnosis Date   Asthma    followed by pcp   Bronchitis    "chronic"   Hypertension    OA (osteoarthritis)    left knee, right shoulder, left hip   Pneumonia    "a couple years ago" per pt on 09/15/21   Seasonal allergies    Wears glasses     Surgeries: Procedure(s): L3-4 AND L4-5 CENTRAL LUMBAR LAMINECTOMIES on 09/20/2021   Consultants:   Discharged Condition: Improved  Hospital Course: Darlene Crosby is an 56 y.o. female who was admitted 09/20/2021 for operative treatment of Lumbar spondylosis. Patient failed conservative treatments (please see the history and physical for the specifics) and had severe unremitting pain that affects sleep, daily activities and work/hobbies. After pre-op clearance, the patient was taken to the operating room on 09/20/2021 and underwent  Procedure(s): L3-4 AND L4-5 CENTRAL LUMBAR LAMINECTOMIES.    Patient was given perioperative antibiotics:  Anti-infectives (From admission, onward)    Start     Dose/Rate Route Frequency Ordered Stop   09/20/21 1600  ceFAZolin (ANCEF) IVPB 2g/100 mL premix        2 g 200 mL/hr over 30 Minutes Intravenous Every 8 hours 09/20/21 1217 09/20/21 2353   09/20/21 0600  ceFAZolin (ANCEF) IVPB 2g/100 mL premix        2 g 200 mL/hr over 30 Minutes Intravenous On call to O.R. 09/20/21 0549 09/20/21 0825        Patient was given sequential compression devices and  early ambulation to prevent DVT.   Patient benefited maximally from hospital stay and there were no complications. At the time of discharge, the patient was urinating/moving their bowels without difficulty, tolerating a regular diet, pain is controlled with oral pain medications and they have been cleared by PT/OT.   Recent vital signs: No data found.   Recent laboratory studies: No results for input(s): "WBC", "HGB", "HCT", "PLT", "NA", "K", "CL", "CO2", "BUN", "CREATININE", "GLUCOSE", "INR", "CALCIUM" in the last 72 hours.  Invalid input(s): "PT", "2"   Discharge Medications:   Allergies as of 09/21/2021       Reactions   Pineapple Shortness Of Breath, Swelling, Hives   Swelling of tongue    Chocolate Hives   Coconut (cocos Nucifera) Hives, Rash   Strawberry Extract Hives   Wheat Bran Hives        Medication List     STOP taking these medications    cyclobenzaprine 10 MG tablet Commonly known as: FLEXERIL       TAKE these medications    acetaminophen 650 MG CR tablet Commonly known as: TYLENOL Take 1,300 mg by mouth every 8 (eight) hours as needed for pain.   albuterol 108 (90 Base) MCG/ACT inhaler Commonly known as: VENTOLIN HFA Inhale 2 puffs into the lungs every 4 (four) hours as needed (wheezing and SOB).   benzonatate 100 MG capsule Commonly known as: TESSALON Take 1 capsule (100 mg total) by mouth 3 (three) times daily as needed for cough.  BIOFREEZE EX Apply 1 Application topically daily as needed (pain).   CAL-MAG-ZINC PO Take 1 tablet by mouth daily.   cyanocobalamin 1000 MCG tablet Commonly known as: VITAMIN B12 Take 1,000 mcg by mouth daily.   diclofenac 50 MG EC tablet Commonly known as: VOLTAREN Take 1 tablet (50 mg total) by mouth 3 (three) times daily.   docusate sodium 100 MG capsule Commonly known as: COLACE Take 1 capsule (100 mg total) by mouth 2 (two) times daily.   fluticasone 50 MCG/ACT nasal spray Commonly known as:  FLONASE Place 1 spray into both nostrils daily.   ipratropium-albuterol 0.5-2.5 (3) MG/3ML Soln Commonly known as: DUONEB Inhale 3 mLs into the lungs every 6 (six) hours as needed for shortness of breath.   methocarbamol 500 MG tablet Commonly known as: ROBAXIN Take 1 tablet (500 mg total) by mouth every 6 (six) hours as needed for muscle spasms.   pregabalin 50 MG capsule Commonly known as: LYRICA Take 1 capsule (50 mg total) by mouth 2 (two) times daily.   PROBIOTIC PO Take 1 capsule by mouth daily.   Turmeric 500 MG Caps Take 500 mg by mouth daily.   VITAMIN C PO Take 1 tablet by mouth daily.   Vitamin D 50 MCG (2000 UT) tablet Take 2,000 Units by mouth daily.        Diagnostic Studies: No results found.  Discharge Instructions     Call MD / Call 911   Complete by: As directed    If you experience chest pain or shortness of breath, CALL 911 and be transported to the hospital emergency room.  If you develope a fever above 101 F, pus (white drainage) or increased drainage or redness at the wound, or calf pain, call your surgeon's office.   Constipation Prevention   Complete by: As directed    Drink plenty of fluids.  Prune juice may be helpful.  You may use a stool softener, such as Colace (over the counter) 100 mg twice a day.  Use MiraLax (over the counter) for constipation as needed.   Diet - low sodium heart healthy   Complete by: As directed    Discharge instructions   Complete by: As directed    Ice to the lumbar area 30 min at a time avoid prolong exposure it delayes healing of skin edges. No lifting greater than 10 lbs. Avoid bending, stooping and twisting. Walk in house for first week them may start to get out slowly increasing distance up to one mile by 3 weeks post op. Keep incision dry for 3 days, may use tegaderm or similar water impervious dressing.   Driving restrictions   Complete by: As directed    No driving for 3 weeks   Increase activity  slowly as tolerated   Complete by: As directed    Lifting restrictions   Complete by: As directed    No lifting for 6 weeks   Post-operative opioid taper instructions:   Complete by: As directed    POST-OPERATIVE OPIOID TAPER INSTRUCTIONS: It is important to wean off of your opioid medication as soon as possible. If you do not need pain medication after your surgery it is ok to stop day one. Opioids include: Codeine, Hydrocodone(Norco, Vicodin), Oxycodone(Percocet, oxycontin) and hydromorphone amongst others.  Long term and even short term use of opiods can cause: Increased pain response Dependence Constipation Depression Respiratory depression And more.  Withdrawal symptoms can include Flu like symptoms Nausea, vomiting And more Techniques to  manage these symptoms Hydrate well Eat regular healthy meals Stay active Use relaxation techniques(deep breathing, meditating, yoga) Do Not substitute Alcohol to help with tapering If you have been on opioids for less than two weeks and do not have pain than it is ok to stop all together.  Plan to wean off of opioids This plan should start within one week post op of your joint replacement. Maintain the same interval or time between taking each dose and first decrease the dose.  Cut the total daily intake of opioids by one tablet each day Next start to increase the time between doses. The last dose that should be eliminated is the evening dose.           Follow-up Information     Jessy Oto, MD Follow up in 2 week(s).   Specialty: Orthopedic Surgery Why: For wound re-check Contact information: [place] [address] [phone]         [facility] Follow up.   Specialty: [place] Why: The home health agency will contact you for the first home visit Contact information: [place] [place] [address] [phone]                 Discharge Plan:   discharge to home  Disposition:     Signed: Benjiman Core  10/22/2021, 2:02 PM

## 2021-10-27 ENCOUNTER — Other Ambulatory Visit: Payer: Self-pay | Admitting: Obstetrics and Gynecology

## 2021-10-27 DIAGNOSIS — Z1231 Encounter for screening mammogram for malignant neoplasm of breast: Secondary | ICD-10-CM

## 2021-10-29 ENCOUNTER — Encounter: Payer: Self-pay | Admitting: Specialist

## 2021-10-29 ENCOUNTER — Ambulatory Visit (INDEPENDENT_AMBULATORY_CARE_PROVIDER_SITE_OTHER): Payer: Commercial Managed Care - HMO | Admitting: Specialist

## 2021-10-29 VITALS — BP 144/88 | HR 93 | Ht 65.0 in | Wt 230.0 lb

## 2021-10-29 DIAGNOSIS — M47816 Spondylosis without myelopathy or radiculopathy, lumbar region: Secondary | ICD-10-CM

## 2021-10-29 DIAGNOSIS — M5136 Other intervertebral disc degeneration, lumbar region: Secondary | ICD-10-CM

## 2021-10-29 DIAGNOSIS — Q7649 Other congenital malformations of spine, not associated with scoliosis: Secondary | ICD-10-CM

## 2021-10-29 DIAGNOSIS — Z9889 Other specified postprocedural states: Secondary | ICD-10-CM

## 2021-10-29 DIAGNOSIS — Z96642 Presence of left artificial hip joint: Secondary | ICD-10-CM

## 2021-10-29 NOTE — Patient Instructions (Signed)
Plan: Avoid frequent bending and stooping  No lifting greater than 10 lbs. May use ice or moist heat for pain. Weight loss is of benefit. Best medication for lumbar disc disease is arthritis medications like motrin, celebrex and naprosyn. Exercise is important to improve your indurance and does allow people to function better inspite of back pain. Wishes to return to part time sitting job in [employer] w

## 2021-10-29 NOTE — Progress Notes (Signed)
Post-Op Visit Note   Patient: Darlene Crosby           Date of Birth: 1966/02/04           MRN: [id number] Visit Date: 10/29/2021 PCP: Camillia Herter, NP   Assessment & Plan 6 WEEKS POST OP L3-4 and l4-5 laminectomies for stenosis and HNP  Chief Complaint:  Chief Complaint  Patient presents with   Lower Back - Routine Post Op  Awake, alert and oriented x 4. Walking for exercise, muscular incisional pain after up for 1-2 hours No bowel or bladder difficulty. Blood sugar. Incision is healed Motor intact, left hip flexion is returning to normal strength  SLR is normal  Visit Diagnoses:  1. Degeneration of lumbar intervertebral disc   2. History of lumbar laminectomy for spinal cord decompression   3. History of total left hip replacement   4. Lumbar spondylosis   5. Congenital spinal stenosis of lumbar region     Plan: Avoid frequent bending and stooping  No lifting greater than 10 lbs. May use ice or moist heat for pain. Weight loss is of benefit. Best medication for lumbar disc disease is arthritis medications like motrin, celebrex and naprosyn. Exercise is important to improve your indurance and does allow people to function better inspite of back pain. Wishes to return to part time sitting job in [employer] work.     Follow-Up Instructions: No follow-ups on file.   Orders:  No orders of the defined types were placed in this encounter.  No orders of the defined types were placed in this encounter.   Imaging: No results found.  PMFS History: Patient Active Problem List   Diagnosis Date Noted   Status post lumbar laminectomy 09/20/2021   Body mass index (BMI) 37.0-37.9, adult 03/14/2019   Lumbar stenosis without neurogenic claudication 03/14/2019   Hyperlipidemia 02/08/2019   Lumbar spondylosis 10/25/2018   Degeneration of lumbar intervertebral disc 10/01/2018   Surgical wound dehiscence 06/06/2018   Osteoarthritis of left hip 04/25/2018    Spinal stenosis of lumbar region 02/23/2018   Pain in left knee 01/10/2018   Trigger thumb of right hand 03/30/2017   Dyspnea 03/26/2013   Asthma vs VCD  02/28/2013   Hypertensive disorder 06/07/2012   Pyogenic granuloma 02/15/2012   Past Medical History:  Diagnosis Date   Asthma    followed by pcp   Bronchitis    "chronic"   Hypertension    OA (osteoarthritis)    left knee, right shoulder, left hip   Pneumonia    "a couple years ago" per pt on 09/15/21   Seasonal allergies    Wears glasses     Family History  Problem Relation Age of Onset   Hypertension Mother    Asthma Mother    Allergies Mother    Breast cancer Sister    Heart disease Sister    Hypertension Sister    Stroke Sister    Diabetes Sister    Colon cancer Neg Hx    Esophageal cancer Neg Hx    Stomach cancer Neg Hx    Pancreatic cancer Neg Hx     Past Surgical History:  Procedure Laterality Date   ANTERIOR HIP REVISION Left 06/06/2018   Procedure: LEFT HIP WOUND DEHISCENCE POSSIBLE HEAD AND LINER EXCHANGE;  Surgeon: Rod Can, MD;  Location: [facility];  Service: Orthopedics;  Laterality: Left;   COLONOSCOPY  2015   DIAGNOSTIC LAPAROSCOPY     tubal preg-took ovary and  tube   LAPAROSCOPY FOR ECTOPIC PREGNANCY  1990s   LEFT SALPINGECTOMY AND RIGHT OOPHORECTOMY FOR ABNORMALITY   LUMBAR LAMINECTOMY N/A 09/20/2021   Procedure: L3-4 AND L4-5 CENTRAL LUMBAR LAMINECTOMIES;  Surgeon: Jessy Oto, MD;  Location: [place];  Service: Orthopedics;  Laterality: N/A;   MASS EXCISION  02/15/2012   Procedure: EXCISION MASS;  Surgeon: Schuyler Amor, MD;  Location: [place];  Service: Orthopedics;  Laterality: Right;  Excision of Right Small Volar Mass   MYELOGRAM     TONSILLECTOMY  age 56   TOTAL HIP ARTHROPLASTY Left 04/25/2018   Procedure: TOTAL HIP ARTHROPLASTY ANTERIOR APPROACH;  Surgeon: Rod Can, MD;  Location: [facility];  Service: Orthopedics;  Laterality: Left;   TRIGGER FINGER RELEASE  Right 2018   thumb   WISDOM TOOTH EXTRACTION  age 56   Social History   Occupational History   Not on file  Tobacco Use   Smoking status: Never    Passive exposure: Never   Smokeless tobacco: Never  Vaping Use   Vaping Use: Never used  Substance and Sexual Activity   Alcohol use: Yes    Alcohol/week: 1.0 standard drink of alcohol    Types: 1 Glasses of wine per week   Drug use: Never   Sexual activity: Not on file

## 2021-11-03 ENCOUNTER — Other Ambulatory Visit: Payer: Self-pay | Admitting: Radiology

## 2021-11-03 ENCOUNTER — Other Ambulatory Visit: Payer: Self-pay

## 2021-11-03 DIAGNOSIS — E785 Hyperlipidemia, unspecified: Secondary | ICD-10-CM

## 2021-11-03 DIAGNOSIS — J3089 Other allergic rhinitis: Secondary | ICD-10-CM

## 2021-11-03 DIAGNOSIS — I10 Essential (primary) hypertension: Secondary | ICD-10-CM

## 2021-11-03 MED ORDER — AMLODIPINE BESYLATE 5 MG PO TABS: 5.0000 mg | ORAL_TABLET | Freq: Every day | ORAL | 0 refills | Status: DC

## 2021-11-03 MED ORDER — LEVOCETIRIZINE DIHYDROCHLORIDE 5 MG PO TABS: 5.0000 mg | ORAL_TABLET | Freq: Every evening | ORAL | 0 refills | Status: DC

## 2021-11-03 MED ORDER — ROSUVASTATIN CALCIUM 20 MG PO TABS: 20.0000 mg | ORAL_TABLET | Freq: Every day | ORAL | 0 refills | Status: DC

## 2021-11-03 NOTE — Telephone Encounter (Signed)
This prescription is to go to the mail order pharmacy, is this what she wants. She just filled her last #60 rx on 8/16 so she has another 10 days  Left before she runs out, this is a controlled substance.

## 2021-11-04 MED ORDER — PREGABALIN 50 MG PO CAPS: 50.0000 mg | ORAL_CAPSULE | Freq: Two times a day (BID) | ORAL | 2 refills | Status: DC

## 2021-11-16 ENCOUNTER — Ambulatory Visit
Admission: RE | Admit: 2021-11-16 | Discharge: 2021-11-16 | Disposition: A | Discharge status: Home / Self Care | Payer: Commercial Managed Care - HMO | Source: Ambulatory Visit | Attending: Obstetrics and Gynecology | Admitting: Obstetrics and Gynecology

## 2021-11-16 DIAGNOSIS — Z1231 Encounter for screening mammogram for malignant neoplasm of breast: Secondary | ICD-10-CM

## 2021-12-10 ENCOUNTER — Ambulatory Visit (INDEPENDENT_AMBULATORY_CARE_PROVIDER_SITE_OTHER): Payer: Commercial Managed Care - HMO | Admitting: Specialist

## 2021-12-10 ENCOUNTER — Encounter: Payer: Self-pay | Admitting: Specialist

## 2021-12-10 VITALS — BP 139/85 | HR 79 | Ht 65.0 in | Wt 230.0 lb

## 2021-12-10 DIAGNOSIS — M48062 Spinal stenosis, lumbar region with neurogenic claudication: Secondary | ICD-10-CM

## 2021-12-10 DIAGNOSIS — R29898 Other symptoms and signs involving the musculoskeletal system: Secondary | ICD-10-CM

## 2021-12-10 DIAGNOSIS — Z9889 Other specified postprocedural states: Secondary | ICD-10-CM

## 2021-12-10 MED ORDER — DICLOFENAC SODIUM 50 MG PO TBEC: 50.0000 mg | DELAYED_RELEASE_TABLET | Freq: Three times a day (TID) | ORAL | 6 refills | Status: DC

## 2021-12-10 MED ORDER — METHOCARBAMOL 500 MG PO TABS: 500.0000 mg | ORAL_TABLET | Freq: Four times a day (QID) | ORAL | 6 refills | Status: DC | PRN

## 2021-12-10 NOTE — Progress Notes (Signed)
Post-Op Visit Note   Patient: Darlene Crosby           Date of Birth: 28-Mar-1965           MRN: [id number] Visit Date: 12/10/2021 PCP: Camillia Herter, NP   Assessment & Plan:3 months post op L3-4 and L4-5 central laminectomies for stenosis.  Chief Complaint:  Chief Complaint  Patient presents with   Lower Back - Follow-up  56 year old female with lumbar spinal stenosis post laminectomies L3-4 and L4-5.  She report still some back discomfort with unsupported sitting as in the bleachers for football game. Her bowel and bladder is functioning normal. She returned to work, "my funding was running low" Motor in the legs is intact. She has some hip flexion ROM limitation left more than right post left THR. Weakness left hip flexor.   Visit Diagnoses:  1. Spinal stenosis of lumbar region with neurogenic claudication   2. Weakness of left hip   3. Status post lumbar laminectomy     Plan: Avoid frequent bending and stooping  No lifting greater than 10 lbs. May use ice or moist heat for pain. Weight loss is of benefit. Best medication for lumbar disc disease is arthritis medications like motrin, diclofenac, celebrex and naprosyn. Exercise is important to improve your indurance and does allow people to function better inspite of back pain. Stretching exercises and walking are excellent exercises.  Lyrica for neurogenic pain, gradually work to be taking once a day at night or before sleep. This medication requires prescription to obtain and is a controlled substance, voltaren and methocarbamol.  Follow-Up Instructions: Return in about 4 weeks (around 01/07/2022).   Orders:  No orders of the defined types were placed in this encounter.  No orders of the defined types were placed in this encounter.   Imaging: No results found.  PMFS History: Patient Active Problem List   Diagnosis Date Noted   Status post lumbar laminectomy 09/20/2021   Body mass index (BMI) 37.0-37.9,  adult 03/14/2019   Lumbar stenosis without neurogenic claudication 03/14/2019   Hyperlipidemia 02/08/2019   Lumbar spondylosis 10/25/2018   Degeneration of lumbar intervertebral disc 10/01/2018   Surgical wound dehiscence 06/06/2018   Osteoarthritis of left hip 04/25/2018   Spinal stenosis of lumbar region 02/23/2018   Pain in left knee 01/10/2018   Trigger thumb of right hand 03/30/2017   Dyspnea 03/26/2013   Asthma vs VCD  02/28/2013   Hypertensive disorder 06/07/2012   Pyogenic granuloma 02/15/2012   Past Medical History:  Diagnosis Date   Asthma    followed by pcp   Bronchitis    "chronic"   Hypertension    OA (osteoarthritis)    left knee, right shoulder, left hip   Pneumonia    "a couple years ago" per pt on 09/15/21   Seasonal allergies    Wears glasses     Family History  Problem Relation Age of Onset   Hypertension Mother    Asthma Mother    Allergies Mother    Breast cancer Sister    Heart disease Sister    Hypertension Sister    Stroke Sister    Diabetes Sister    Colon cancer Neg Hx    Esophageal cancer Neg Hx    Stomach cancer Neg Hx    Pancreatic cancer Neg Hx     Past Surgical History:  Procedure Laterality Date   ANTERIOR HIP REVISION Left 06/06/2018   Procedure: LEFT HIP WOUND DEHISCENCE  POSSIBLE HEAD AND LINER EXCHANGE;  Surgeon: Rod Can, MD;  Location: [facility];  Service: Orthopedics;  Laterality: Left;   COLONOSCOPY  2015   DIAGNOSTIC LAPAROSCOPY     tubal preg-took ovary and tube   LAPAROSCOPY FOR ECTOPIC PREGNANCY  1990s   LEFT SALPINGECTOMY AND RIGHT OOPHORECTOMY FOR ABNORMALITY   LUMBAR LAMINECTOMY N/A 09/20/2021   Procedure: L3-4 AND L4-5 CENTRAL LUMBAR LAMINECTOMIES;  Surgeon: Jessy Oto, MD;  Location: [place];  Service: Orthopedics;  Laterality: N/A;   MASS EXCISION  02/15/2012   Procedure: EXCISION MASS;  Surgeon: Schuyler Amor, MD;  Location: [place];  Service: Orthopedics;  Laterality: Right;  Excision  of Right Small Volar Mass   MYELOGRAM     TONSILLECTOMY  age 88   TOTAL HIP ARTHROPLASTY Left 04/25/2018   Procedure: TOTAL HIP ARTHROPLASTY ANTERIOR APPROACH;  Surgeon: Rod Can, MD;  Location: [facility];  Service: Orthopedics;  Laterality: Left;   TRIGGER FINGER RELEASE Right 2018   thumb   WISDOM TOOTH EXTRACTION  age 15   Social History   Occupational History   Not on file  Tobacco Use   Smoking status: Never    Passive exposure: Never   Smokeless tobacco: Never  Vaping Use   Vaping Use: Never used  Substance and Sexual Activity   Alcohol use: Yes    Alcohol/week: 1.0 standard drink of alcohol    Types: 1 Glasses of wine per week   Drug use: Never   Sexual activity: Not on file

## 2021-12-10 NOTE — Patient Instructions (Signed)
Plan: Avoid frequent bending and stooping  No lifting greater than 10 lbs. May use ice or moist heat for pain. Weight loss is of benefit. Best medication for lumbar disc disease is arthritis medications like motrin, diclofenac, celebrex and naprosyn. Exercise is important to improve your indurance and does allow people to function better inspite of back pain. Stretching exercises and walking are excellent exercises. Lyrica for neurogenic pain, gradually work to be taking once a day at night or before sleep. This medication requires prescription to obtain and is a controlled substance, voltaren and methocarbamol.

## 2021-12-16 ENCOUNTER — Encounter: Payer: Self-pay | Admitting: Family

## 2021-12-17 NOTE — Telephone Encounter (Signed)
Immunizations have been updated

## 2022-02-08 ENCOUNTER — Other Ambulatory Visit: Payer: Self-pay | Admitting: Family

## 2022-02-08 DIAGNOSIS — I10 Essential (primary) hypertension: Secondary | ICD-10-CM

## 2022-02-08 NOTE — Telephone Encounter (Signed)
Requested Prescriptions  Pending Prescriptions Disp Refills   amLODipine (NORVASC) 5 MG tablet [Pharmacy Med Name: AMLODIPINE BESYLATE TABS '5MG'$ ] 90 tablet 3    Sig: TAKE 1 TABLET DAILY     Cardiovascular: Calcium Channel Blockers 2 Passed - 02/08/2022 11:25 AM      Passed - Last BP in normal range    BP Readings from Last 1 Encounters:  12/10/21 139/85         Passed - Last Heart Rate in normal range    Pulse Readings from Last 1 Encounters:  12/10/21 79         Passed - Valid encounter within last 6 months    Recent Outpatient Visits           3 months ago Essential (primary) hypertension   Primary Care at [facility], Amy J, NP   8 months ago Essential (primary) hypertension   Primary Care at [facility], Amy J, NP   11 months ago Essential hypertension   Primary Care at [facility], Amy J, NP   1 year ago Essential hypertension   [facility] at [facility], Amy J, NP   1 year ago Essential hypertension   Primary Care at [facility], Flonnie Hailstone, NP       Future Appointments             In 1 month Laurance Flatten Venia Carbon, MD [facility]   In 2 months Camillia Herter, NP Primary Care at [facility]

## 2022-02-11 ENCOUNTER — Other Ambulatory Visit: Payer: Self-pay | Admitting: Orthopedic Surgery

## 2022-02-11 NOTE — Telephone Encounter (Signed)
Pt called and states that she need prior auth for her medication.

## 2022-02-15 MED ORDER — DICLOFENAC SODIUM 50 MG PO TBEC: 50.0000 mg | DELAYED_RELEASE_TABLET | Freq: Three times a day (TID) | ORAL | 6 refills | Status: DC

## 2022-02-15 NOTE — Telephone Encounter (Signed)
I've never seen her, recommend she follow-up with Dr Laurance Flatten for further management or another provider

## 2022-03-11 ENCOUNTER — Ambulatory Visit (INDEPENDENT_AMBULATORY_CARE_PROVIDER_SITE_OTHER): Payer: Commercial Managed Care - HMO

## 2022-03-11 ENCOUNTER — Ambulatory Visit: Payer: 59 | Admitting: Orthopedic Surgery

## 2022-03-11 DIAGNOSIS — M48062 Spinal stenosis, lumbar region with neurogenic claudication: Secondary | ICD-10-CM | POA: Diagnosis not present

## 2022-03-11 NOTE — Progress Notes (Signed)
Orthopedic Spine Surgery Office Note  Assessment: Patient is a 57 y.o. female status with chronic stable low back pain. Had neurogenic claudication as well and is status post L3/4 and L4/5 laminectomies (~6 months post-op)   Plan: -Operative plans complete -Out of bed as tolerated, no brace -Over the counter medications as needed for pain relief -Recommended gradually return to full activities -Discussed wright loss and core strengthening to help with her back pain -Patient should return to office in 6 months, x-rays at next visit: AP/lateral/flex/ex lumbar   Patient expressed understanding of the plan and all questions were answered to the patient's satisfaction.   ___________________________________________________________________________   History:  Patient is a 57 y.o. female who presents today for lumbar spine.  Patient underwent L3/4 and L4/5 laminectomies with my retired partner, Dr. Louanne Skye, on 09/16/2021. Pain in her legs has gotten better, Has noticed the strength in her left thigh has improved but is still slightly weaker. No other weakness noted. Not having any right leg pain. Still has some left thigh pain on occasion. Has back pain if she lets anything heavy or does a lot of walking. No change in bowel or bladder habits.  Has not noticed any redness or drainage around her incision.  Review of systems: Denies fevers and chills, night sweats, unexplained weight loss, history of cancer, pain that wakes them at night  Past medical history: Asthma Chronic pericarditis Hypertension Osteoarthritis  Allergies: NKDA  Past surgical history:  Left hip head and liner exchange Left THA Ectopic pregnancy surgery L3/4 and L4/5 laminectomies Tonsillectomy Right hand mass excision Right trigger thumb release Wisdom tooth extraction  Social history: Denies use of nicotine product (smoking, vaping, patches, smokeless) Alcohol use: yes, 1 drink per week Denies recreational drug  use  Physical Exam:  General: no acute distress, appears stated age Neurologic: alert, answering questions appropriately, following commands Respiratory: unlabored breathing on room air, symmetric chest rise Psychiatric: appropriate affect, normal cadence to speech  Posterior lumbar incision appears well healed with no evidence of infection  MSK (spine):  -Strength exam      Left  Right EHL    5/5  5/5 TA    5/5  5/5 GSC    5/5  5/5 Knee extension  5/5  5/5 Hip flexion   5/5  4+/5  -Sensory exam    Sensation intact to light touch in L3-S1 nerve distributions of bilateral lower extremities  Imaging: XR of the lumbar spine from 03/11/2022 was independently reviewed and interpreted, showing laminectomy defects from L3-5. Disc height loss at L3/4 and L4/5 with anterior osteophyte formation. No fracture or dislocation. No evidence of instability on flexion/extension views.   Patient name: Darlene Crosby Patient MRN: [id number] Date of visit: 03/11/22

## 2022-04-06 NOTE — Progress Notes (Signed)
Patient ID: Darlene Crosby, female    DOB: Sep 25, 1965  MRN: [id number]  CC: Chronic Care Management   Subjective: Darlene Crosby is a 57 y.o. female who presents for chronic care management.   Her concerns today include: Doing well on blood pressure and cholesterol medications, no issues/concerns. Denies red flag symptoms. Endorses cough and requesting Tessalon perles. Thinks she got the cough from her 52 y.o. grandchild. She denies red flag symptoms. She declines respiratory panel.   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 04/12/2022   Status post lumbar laminectomy 09/20/2021   Body mass index (BMI) 37.0-37.9, adult 03/14/2019   Lumbar stenosis without neurogenic claudication 03/14/2019   Hyperlipidemia 02/08/2019   Lumbar spondylosis 10/25/2018   Degeneration of lumbar intervertebral disc 10/01/2018   Surgical wound dehiscence 06/06/2018   Osteoarthritis of left hip 04/25/2018   Spinal stenosis of lumbar region 02/23/2018   Pain in left knee 01/10/2018   Trigger thumb of right hand 03/30/2017   Dyspnea 03/26/2013   Asthma vs VCD  02/28/2013   Hypertensive disorder 06/07/2012   Pyogenic granuloma 02/15/2012     Current Outpatient Medications on File Prior to Visit  Medication Sig Dispense Refill   acetaminophen (TYLENOL) 650 MG CR tablet Take 1,300 mg by mouth every 8 (eight) hours as needed for pain.     albuterol (VENTOLIN HFA) 108 (90 Base) MCG/ACT inhaler Inhale 2 puffs into the lungs every 4 (four) hours as needed (wheezing and SOB).      Ascorbic Acid (VITAMIN C PO) Take 1 tablet by mouth daily.     Calcium-Magnesium-Zinc (CAL-MAG-ZINC PO) Take 1 tablet by mouth daily.     Cholecalciferol (VITAMIN D) 50 MCG (2000 UT) tablet Take 2,000 Units by mouth daily.     diclofenac (VOLTAREN) 50 MG EC tablet Take 1 tablet (50 mg total) by mouth 3 (three) times daily. 90 tablet 6   docusate sodium  (COLACE) 100 MG capsule Take 1 capsule (100 mg total) by mouth 2 (two) times daily. 40 capsule 0   fluticasone (FLONASE) 50 MCG/ACT nasal spray Place 1 spray into both nostrils daily.     HYDROcodone-acetaminophen (NORCO) 7.5-325 MG tablet Take 1 tablet by mouth every 4 (four) hours as needed for moderate pain. 40 tablet 0   ipratropium-albuterol (DUONEB) 0.5-2.5 (3) MG/3ML SOLN Inhale 3 mLs into the lungs every 6 (six) hours as needed for shortness of breath.     levocetirizine (XYZAL) 5 MG tablet Take 1 tablet (5 mg total) by mouth every evening. 90 tablet 0   Menthol, Topical Analgesic, (BIOFREEZE EX) Apply 1 Application topically daily as needed (pain).     methocarbamol (ROBAXIN) 500 MG tablet Take 1 tablet (500 mg total) by mouth every 6 (six) hours as needed for muscle spasms. 90 tablet 6   pregabalin (LYRICA) 50 MG capsule Take 1 capsule (50 mg total) by mouth 2 (two) times daily. 180 capsule 2   Probiotic Product (PROBIOTIC PO) Take 1 capsule by mouth daily.     Turmeric 500 MG CAPS Take 500 mg by mouth daily.     vitamin B-12 (CYANOCOBALAMIN) 1000 MCG tablet Take 1,000 mcg by mouth daily.     No current facility-administered medications on file prior to visit.    Allergies  Allergen Reactions   Pineapple Shortness Of Breath, Swelling and Hives    Swelling of tongue    Chocolate Hives  Coconut (Cocos Nucifera) Hives and Rash   Strawberry Extract Hives   Wheat Bran Hives   Chocolate Flavor Other (See Comments) and Hives   Pineapple Flavor Hives    Social History   Socioeconomic History   Marital status: Widowed    Spouse name: Not on file   Number of children: Not on file   Years of education: Not on file   Highest education level: Not on file  Occupational History   Not on file  Tobacco Use   Smoking status: Never    Passive exposure: Never   Smokeless tobacco: Never  Vaping Use   Vaping Use: Never used  Substance and Sexual Activity   Alcohol use: Yes     Alcohol/week: 1.0 standard drink of alcohol    Types: 1 Glasses of wine per week   Drug use: Never   Sexual activity: Not on file  Other Topics Concern   Not on file  Social History Narrative   Not on file   Social Determinants of Health   Financial Resource Strain: Not on file  Food Insecurity: Not on file  Transportation Needs: Not on file  Physical Activity: Not on file  Stress: Not on file  Social Connections: Not on file  Intimate Partner Violence: Not on file    Family History  Problem Relation Age of Onset   Hypertension Mother    Asthma Mother    Allergies Mother    Breast cancer Sister    Heart disease Sister    Hypertension Sister    Stroke Sister    Diabetes Sister    Colon cancer Neg Hx    Esophageal cancer Neg Hx    Stomach cancer Neg Hx    Pancreatic cancer Neg Hx     Past Surgical History:  Procedure Laterality Date   ANTERIOR HIP REVISION Left 06/06/2018   Procedure: LEFT HIP WOUND DEHISCENCE POSSIBLE HEAD AND LINER EXCHANGE;  Surgeon: Rod Can, MD;  Location: [facility];  Service: Orthopedics;  Laterality: Left;   COLONOSCOPY  2015   DIAGNOSTIC LAPAROSCOPY     tubal preg-took ovary and tube   LAPAROSCOPY FOR ECTOPIC PREGNANCY  1990s   LEFT SALPINGECTOMY AND RIGHT OOPHORECTOMY FOR ABNORMALITY   LUMBAR LAMINECTOMY N/A 09/20/2021   Procedure: L3-4 AND L4-5 CENTRAL LUMBAR LAMINECTOMIES;  Surgeon: Jessy Oto, MD;  Location: [place];  Service: Orthopedics;  Laterality: N/A;   MASS EXCISION  02/15/2012   Procedure: EXCISION MASS;  Surgeon: Schuyler Amor, MD;  Location: [place];  Service: Orthopedics;  Laterality: Right;  Excision of Right Small Volar Mass   MYELOGRAM     TONSILLECTOMY  age 57   TOTAL HIP ARTHROPLASTY Left 04/25/2018   Procedure: TOTAL HIP ARTHROPLASTY ANTERIOR APPROACH;  Surgeon: Rod Can, MD;  Location: [facility];  Service: Orthopedics;  Laterality: Left;   TRIGGER FINGER RELEASE Right 2018   thumb    WISDOM TOOTH EXTRACTION  age 29    ROS: Review of Systems Negative except as stated above  PHYSICAL EXAM: BP 112/78 (BP Location: Left Arm, Patient Position: Sitting, Cuff Size: Large)   Pulse 77   Temp 98.3 F (36.8 C)   Resp 16   Ht 5' 5"$  (1.651 m)   Wt 227 lb (103 kg)   LMP 05/04/2012   SpO2 95%   BMI 37.77 kg/m   Physical Exam HENT:     Head: Normocephalic and atraumatic.  Eyes:     Extraocular Movements: Extraocular  movements intact.     Conjunctiva/sclera: Conjunctivae normal.     Pupils: Pupils are equal, round, and reactive to light.  Cardiovascular:     Rate and Rhythm: Normal rate and regular rhythm.     Pulses: Normal pulses.     Heart sounds: Normal heart sounds.  Pulmonary:     Effort: Pulmonary effort is normal.     Breath sounds: Normal breath sounds.  Musculoskeletal:     Cervical back: Normal range of motion and neck supple.  Neurological:     General: No focal deficit present.     Mental Status: She is alert and oriented to person, place, and time.  Psychiatric:        Mood and Affect: Mood normal.        Behavior: Behavior normal.     Results for orders placed or performed in visit on 04/12/22  POCT glycosylated hemoglobin (Hb A1C)  Result Value Ref Range   Hemoglobin A1C     HbA1c POC (<> result, manual entry)     HbA1c, POC (prediabetic range) 6.0 5.7 - 6.4 %   HbA1c, POC (controlled diabetic range)      ASSESSMENT AND PLAN: 1. Primary hypertension - Continue Amlodipine as prescribed.  - Routine screening.  - Counseled on blood pressure goal of less than 130/80, low-sodium, DASH diet, medication compliance, and 150 minutes of moderate intensity exercise per week as tolerated. Counseled on medication adherence and adverse effects. - Follow-up with primary provider in 3 months or sooner if needed.  - Basic Metabolic Panel - amLODipine (NORVASC) 5 MG tablet; Take 1 tablet (5 mg total) by mouth daily.  Dispense: 30 tablet; Refill: 2  2.  Hyperlipidemia, unspecified hyperlipidemia type - Continue Rosuvastatin as prescribed.  - Follow-up with primary provider in 3 months or sooner if needed.  - rosuvastatin (CRESTOR) 20 MG tablet; Take 1 tablet (20 mg total) by mouth at bedtime.  Dispense: 30 tablet; Refill: 2  3. Prediabetes - Hemoglobin A1c 6.0% and remaining consistent with prediabetes.  - Recheck in 6 months. - POCT glycosylated hemoglobin (Hb A1C)  4. Cough, unspecified type - Patient today in office with no cardiopulmonary distress.  - Patient declined respiratory panel.  - Benzonatate capsules as prescribed.   - Follow-up with primary provider as scheduled.  - benzonatate (TESSALON) 100 MG capsule; Take 1 capsule (100 mg total) by mouth 3 (three) times daily as needed for cough.  Dispense: 30 capsule; Refill: 1  Patient was given the opportunity to ask questions.  Patient verbalized understanding of the plan and was able to repeat key elements of the plan. Patient was given clear instructions to go to Emergency Department or return to medical center if symptoms don't improve, worsen, or new problems develop.The patient verbalized understanding.   Orders Placed This Encounter  Procedures   Basic Metabolic Panel   POCT glycosylated hemoglobin (Hb A1C)     Requested Prescriptions   Signed Prescriptions Disp Refills   amLODipine (NORVASC) 5 MG tablet 30 tablet 2    Sig: Take 1 tablet (5 mg total) by mouth daily.   rosuvastatin (CRESTOR) 20 MG tablet 30 tablet 2    Sig: Take 1 tablet (20 mg total) by mouth at bedtime.   benzonatate (TESSALON) 100 MG capsule 30 capsule 1    Sig: Take 1 capsule (100 mg total) by mouth 3 (three) times daily as needed for cough.    Return in about 3 months (around 07/11/2022) for Follow-Up or  next available chronic care mgmt .  Camillia Herter, NP

## 2022-04-12 ENCOUNTER — Encounter: Payer: Self-pay | Admitting: Family

## 2022-04-12 ENCOUNTER — Ambulatory Visit (INDEPENDENT_AMBULATORY_CARE_PROVIDER_SITE_OTHER): Payer: Commercial Managed Care - HMO | Admitting: Family

## 2022-04-12 VITALS — BP 112/78 | HR 77 | Temp 98.3°F | Resp 16 | Ht 65.0 in | Wt 227.0 lb

## 2022-04-12 DIAGNOSIS — R7303 Prediabetes: Secondary | ICD-10-CM

## 2022-04-12 DIAGNOSIS — R059 Cough, unspecified: Secondary | ICD-10-CM

## 2022-04-12 DIAGNOSIS — I10 Essential (primary) hypertension: Secondary | ICD-10-CM

## 2022-04-12 DIAGNOSIS — E785 Hyperlipidemia, unspecified: Secondary | ICD-10-CM | POA: Diagnosis not present

## 2022-04-12 DIAGNOSIS — R8781 Cervical high 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: Secondary | ICD-10-CM | POA: Insufficient documentation

## 2022-04-12 LAB — POCT GLYCOSYLATED HEMOGLOBIN (HGB A1C): HbA1c, POC (prediabetic range): 6 % (ref 5.7–6.4)

## 2022-04-12 MED ORDER — AMLODIPINE BESYLATE 5 MG PO TABS: 5.0000 mg | ORAL_TABLET | Freq: Every day | ORAL | 2 refills | Status: DC

## 2022-04-12 MED ORDER — BENZONATATE 100 MG PO CAPS: 100.0000 mg | ORAL_CAPSULE | Freq: Three times a day (TID) | ORAL | 1 refills | Status: DC | PRN

## 2022-04-12 MED ORDER — ROSUVASTATIN CALCIUM 20 MG PO TABS: 20.0000 mg | ORAL_TABLET | Freq: Every day | ORAL | 2 refills | Status: DC

## 2022-04-12 NOTE — Progress Notes (Signed)
.  Pt presents for chronic care management   -pt request a refill on Tessalon Perles

## 2022-04-13 LAB — BASIC METABOLIC PANEL
BUN/Creatinine Ratio: 18 (ref 9–23)
BUN: 14 mg/dL (ref 6–24)
CO2: 22 mmol/L (ref 20–29)
Calcium: 9.7 mg/dL (ref 8.7–10.2)
Chloride: 102 mmol/L (ref 96–106)
Creatinine, Ser: 0.76 mg/dL (ref 0.57–1.00)
Glucose: 86 mg/dL (ref 70–99)
Potassium: 4 mmol/L (ref 3.5–5.2)
Sodium: 140 mmol/L (ref 134–144)
eGFR: 91 mL/min/{1.73_m2} (ref >59)

## 2022-05-25 ENCOUNTER — Ambulatory Visit
Admission: EM | Admit: 2022-05-25 | Discharge: 2022-05-25 | Disposition: A | Discharge status: Home / Self Care | Payer: Medicare Other | Attending: Physician Assistant | Admitting: Physician Assistant

## 2022-05-25 ENCOUNTER — Telehealth: Payer: Self-pay

## 2022-05-25 DIAGNOSIS — J4521 Mild intermittent asthma with (acute) exacerbation: Secondary | ICD-10-CM

## 2022-05-25 DIAGNOSIS — J209 Acute bronchitis, unspecified: Secondary | ICD-10-CM

## 2022-05-25 MED ORDER — METHYLPREDNISOLONE SODIUM SUCC 125 MG IJ SOLR: 80.0000 mg | Freq: Once | INTRAMUSCULAR | Status: DC

## 2022-05-25 MED ORDER — HYDROCOD POLI-CHLORPHE POLI ER 10-8 MG/5ML PO SUER: 5.0000 mL | Freq: Two times a day (BID) | ORAL | 0 refills | Status: DC | PRN

## 2022-05-25 MED ORDER — SPACER/AERO-HOLDING CHAMBERS DEVI: 1.0000 [IU] | Freq: Three times a day (TID) | 0 refills | Status: AC

## 2022-05-25 MED ORDER — ALBUTEROL SULFATE (2.5 MG/3ML) 0.083% IN NEBU
2.5000 mg | INHALATION_SOLUTION | Freq: Once | RESPIRATORY_TRACT | Status: AC
  Administered 2022-05-25: 2.5 mg via RESPIRATORY_TRACT

## 2022-05-25 MED ORDER — METHYLPREDNISOLONE ACETATE 80 MG/ML IJ SUSP
80.0000 mg | Freq: Once | INTRAMUSCULAR | Status: AC
  Administered 2022-05-25: 80 mg via INTRAMUSCULAR

## 2022-05-25 MED ORDER — PREDNISONE 10 MG PO TABS: 10.0000 mg | ORAL_TABLET | Freq: Three times a day (TID) | ORAL | 0 refills | Status: DC

## 2022-05-25 MED ORDER — ALBUTEROL SULFATE HFA 108 (90 BASE) MCG/ACT IN AERS: 2.0000 | INHALATION_SPRAY | RESPIRATORY_TRACT | 2 refills | Status: DC | PRN

## 2022-05-25 NOTE — Discharge Instructions (Signed)
Advised to continue to use the Ventolin inhaler with spacer, 2 puffs every 6 hours on a regular basis to help control wheezing and shortness of breath. Advised take prednisone 10 mg 3 times a day for 5 days only to help reduce the inflammatory process. Advised take the Tussionex 1 teaspoon every 12 hours as needed for cough and congestion.  Advised follow-up PCP return to urgent care as needed.

## 2022-05-25 NOTE — ED Provider Notes (Signed)
[facility]    CSN: [id number] Arrival date & time: 05/25/22  1030      History   Chief Complaint Chief Complaint  Patient presents with   Shortness of Breath    HPI Darlene Crosby is a 57 y.o. female.   57 year old female presents with wheezing and cough.  Patient indicates she has a history of having asthma and recurrent bronchitis.  She indicates for the past 2 weeks she has been having increasing episodes of chest congestion, cough, wheezing, coughing spasms that keep her up at night.  Patient indicates that she does not have any production with the coughing it is mainly dry but persistent.  She has been taking some OTC cough medicine and Tessalon Perles without relief of her coughing spasms.  She indicates she is not able to sleep due to coughing so much.  She is using her albuterol inhaler on a regular basis without relief of her symptoms.  She indicates she does have some mild upper respiratory congestion associated.  She indicates she has not have any fever, chills, nausea or vomiting.  She is tolerating fluids well.   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 04/12/2022   Status post lumbar laminectomy 09/20/2021   Body mass index (BMI) 37.0-37.9, adult 03/14/2019   Lumbar stenosis without neurogenic claudication 03/14/2019   Hyperlipidemia 02/08/2019   Lumbar spondylosis 10/25/2018   Degeneration of lumbar intervertebral disc 10/01/2018   Surgical wound dehiscence 06/06/2018   Osteoarthritis of left hip 04/25/2018   Spinal stenosis of lumbar  region 02/23/2018   Pain in left knee 01/10/2018   Trigger thumb of right hand 03/30/2017   Dyspnea 03/26/2013   Asthma vs VCD  02/28/2013   Hypertensive disorder 06/07/2012   Pyogenic granuloma 02/15/2012    Past Surgical History:  Procedure Laterality Date   ANTERIOR HIP REVISION Left 06/06/2018   Procedure: LEFT HIP WOUND DEHISCENCE POSSIBLE HEAD AND LINER EXCHANGE;  Surgeon: Rod Can, MD;  Location: [facility];  Service: Orthopedics;  Laterality: Left;   COLONOSCOPY  2015   DIAGNOSTIC LAPAROSCOPY     tubal preg-took ovary and tube   LAPAROSCOPY FOR ECTOPIC PREGNANCY  1990s   LEFT SALPINGECTOMY AND RIGHT OOPHORECTOMY FOR ABNORMALITY   LUMBAR LAMINECTOMY N/A 09/20/2021   Procedure: L3-4 AND L4-5 CENTRAL LUMBAR LAMINECTOMIES;  Surgeon: Jessy Oto, MD;  Location: [place];  Service: Orthopedics;  Laterality: N/A;   MASS EXCISION  02/15/2012   Procedure: EXCISION MASS;  Surgeon: Schuyler Amor, MD;  Location: [place];  Service: Orthopedics;  Laterality: Right;  Excision of Right Small Volar Mass   MYELOGRAM     TONSILLECTOMY  age 32   TOTAL HIP ARTHROPLASTY Left 04/25/2018   Procedure: TOTAL HIP ARTHROPLASTY ANTERIOR APPROACH;  Surgeon: Rod Can, MD;  Location: [facility];  Service: Orthopedics;  Laterality: Left;   TRIGGER FINGER RELEASE Right 2018   thumb   WISDOM TOOTH EXTRACTION  age 49  OB History   No obstetric history on file.      Home Medications    Prior to Admission medications   Medication Sig Start Date End Date Taking? Authorizing Provider  chlorpheniramine-HYDROcodone (TUSSIONEX) 10-8 MG/5ML Take 5 mLs by mouth every 12 (twelve) hours as needed for cough. 05/25/22  Yes Nyoka Lint, PA-C  predniSONE (DELTASONE) 10 MG tablet Take 1 tablet (10 mg total) by mouth in the morning, at noon, and at bedtime. 05/25/22  Yes Nyoka Lint, PA-C  Spacer/Aero-Holding Chambers DEVI 1 Units by Does not apply route in the morning, at noon, and at  bedtime. 05/25/22  Yes Nyoka Lint, PA-C  acetaminophen (TYLENOL) 650 MG CR tablet Take 1,300 mg by mouth every 8 (eight) hours as needed for pain.    [provider]  albuterol (VENTOLIN HFA) 108 (90 Base) MCG/ACT inhaler Inhale 2 puffs into the lungs every 4 (four) hours as needed (wheezing and SOB). 05/25/22   Nyoka Lint, PA-C  amLODipine (NORVASC) 5 MG tablet Take 1 tablet (5 mg total) by mouth daily. 04/12/22 07/11/22  Camillia Herter, NP  Ascorbic Acid (VITAMIN C PO) Take 1 tablet by mouth daily.    [provider]  benzonatate (TESSALON) 100 MG capsule Take 1 capsule (100 mg total) by mouth 3 (three) times daily as needed for cough. 04/12/22   Camillia Herter, NP  Calcium-Magnesium-Zinc (CAL-MAG-ZINC PO) Take 1 tablet by mouth daily.    [provider]  Cholecalciferol (VITAMIN D) 50 MCG (2000 UT) tablet Take 2,000 Units by mouth daily.    [provider]  diclofenac (VOLTAREN) 50 MG EC tablet Take 1 tablet (50 mg total) by mouth 3 (three) times daily. 02/15/22   Callie Fielding, MD  docusate sodium (COLACE) 100 MG capsule Take 1 capsule (100 mg total) by mouth 2 (two) times daily. 09/21/21   Jessy Oto, MD  fluticasone (FLONASE) 50 MCG/ACT nasal spray Place 1 spray into both nostrils daily.    [provider]  HYDROcodone-acetaminophen (NORCO) 7.5-325 MG tablet Take 1 tablet by mouth every 4 (four) hours as needed for moderate pain. 09/21/21   Jessy Oto, MD  ipratropium-albuterol (DUONEB) 0.5-2.5 (3) MG/3ML SOLN Inhale 3 mLs into the lungs every 6 (six) hours as needed for shortness of breath. 08/21/21   [provider]  levocetirizine (XYZAL) 5 MG tablet Take 1 tablet (5 mg total) by mouth every evening. 11/03/21 02/01/22  Camillia Herter, NP  Menthol, Topical Analgesic, (BIOFREEZE EX) Apply 1 Application topically daily as needed (pain).    [provider]  methocarbamol (ROBAXIN) 500 MG tablet Take 1 tablet (500 mg total) by  mouth every 6 (six) hours as needed for muscle spasms. 12/10/21   Jessy Oto, MD  pregabalin (LYRICA) 50 MG capsule Take 1 capsule (50 mg total) by mouth 2 (two) times daily. 11/04/21   Jessy Oto, MD  Probiotic Product (PROBIOTIC PO) Take 1 capsule by mouth daily.    [provider]  rosuvastatin (CRESTOR) 20 MG tablet Take 1 tablet (20 mg total) by mouth at bedtime. 04/12/22 07/11/22  Camillia Herter, NP  Turmeric 500 MG CAPS Take 500 mg by mouth daily.    [provider]  vitamin B-12 (CYANOCOBALAMIN) 1000 MCG tablet Take 1,000 mcg by mouth daily.    [provider]    Family History Family History  Problem Relation Age of Onset   Hypertension Mother    Asthma Mother  Allergies Mother    Breast cancer Sister    Heart disease Sister    Hypertension Sister    Stroke Sister    Diabetes Sister    Colon cancer Neg Hx    Esophageal cancer Neg Hx    Stomach cancer Neg Hx    Pancreatic cancer Neg Hx     Social History Social History   Tobacco Use   Smoking status: Never    Passive exposure: Never   Smokeless tobacco: Never  Vaping Use   Vaping Use: Never used  Substance Use Topics   Alcohol use: Yes    Alcohol/week: 1.0 standard drink of alcohol    Types: 1 Glasses of wine per week   Drug use: Never     Allergies   Pineapple, Chocolate, Coconut (cocos nucifera), Strawberry extract, Wheat, Chocolate flavor, and Pineapple flavor   Review of Systems Review of Systems  Respiratory:  Positive for cough, shortness of breath and wheezing.      Physical Exam Triage Vital Signs ED Triage Vitals  Enc Vitals Group     BP 05/25/22 1040 (!) 176/99     Pulse Rate 05/25/22 1040 90     Resp 05/25/22 1040 (!) 24     Temp 05/25/22 1040 98.1 F (36.7 C)     Temp Source 05/25/22 1040 Oral     SpO2 05/25/22 1040 93 %     Weight --      Height --      Head Circumference --      Peak Flow --      Pain Score 05/25/22 1041 0     Pain Loc --       Pain Edu? --      Excl. in [place]? --    No data found.  Updated Vital Signs BP (!) 152/98   Pulse 90   Temp 98.1 F (36.7 C) (Oral)   Resp (!) 24   LMP 05/04/2012   SpO2 91%   Visual Acuity Right Eye Distance:   Left Eye Distance:   Bilateral Distance:    Right Eye Near:   Left Eye Near:    Bilateral Near:     Physical Exam Constitutional:      Appearance: She is well-developed.  HENT:     Right Ear: Tympanic membrane and ear canal normal.     Left Ear: Tympanic membrane and ear canal normal.     Mouth/Throat:     Mouth: Mucous membranes are moist.     Pharynx: Oropharynx is clear.  Cardiovascular:     Rate and Rhythm: Normal rate and regular rhythm.     Heart sounds: Normal heart sounds.  Pulmonary:     Effort: Pulmonary effort is normal.     Breath sounds: Normal air entry. Examination of the right-lower field reveals wheezing. Examination of the left-lower field reveals wheezing. Wheezing (mild bilat on expiration) present. No rhonchi or rales.  Lymphadenopathy:     Cervical: No cervical adenopathy.  Neurological:     Mental Status: She is alert.      UC Treatments / Results  Labs (all labs ordered are listed, but only abnormal results are displayed) Labs Reviewed - No data to display  EKG   Radiology No results found.  Procedures Procedures (including critical care time)  Medications Ordered in UC Medications  albuterol (PROVENTIL) (2.5 MG/3ML) 0.083% nebulizer solution 2.5 mg (2.5 mg Nebulization Given 05/25/22 1052)  methylPREDNISolone acetate (DEPO-MEDROL) injection 80 mg (80 mg  Intramuscular Given 05/25/22 1054)    Initial Impression / Assessment and Plan / UC Course  I have reviewed the triage vital signs and the nursing notes.  Pertinent labs & imaging results that were available during my care of the patient were reviewed by me and considered in my medical decision making (see chart for details).    Plan: The diagnosis will be treated  with the following: 1.  Mild intermittent asthma with acute exacerbation: A.  Albuterol nebulizer 0.083% treatment given in the office today. B.  Albuterol inhaler with spacer, 2 puffs every 6 hours on a regular basis to control wheezing and shortness of breath. C.  Decadron 80 mg IM given in the office today. 2.  Acute bronchitis: A.  Tussionex cough syrup, 1 teaspoon every 12 hours to help control cough and congestion. B.  Prednisone 10 mg every 8 hours for 5 days only to help control cough, and inflammatory process. 3.  Advised follow-up PCP return to urgent care as needed.  Final Clinical Impressions(s) / UC Diagnoses   Final diagnoses:  Mild intermittent asthma with acute exacerbation  Acute bronchitis, unspecified organism     Discharge Instructions      Advised to continue to use the Ventolin inhaler with spacer, 2 puffs every 6 hours on a regular basis to help control wheezing and shortness of breath. Advised take prednisone 10 mg 3 times a day for 5 days only to help reduce the inflammatory process. Advised take the Tussionex 1 teaspoon every 12 hours as needed for cough and congestion.  Advised follow-up PCP return to urgent care as needed.    ED Prescriptions     Medication Sig Dispense Auth. Provider   albuterol (VENTOLIN HFA) 108 (90 Base) MCG/ACT inhaler Inhale 2 puffs into the lungs every 4 (four) hours as needed (wheezing and SOB). 8 g Nyoka Lint, PA-C   predniSONE (DELTASONE) 10 MG tablet Take 1 tablet (10 mg total) by mouth in the morning, at noon, and at bedtime. 15 tablet Nyoka Lint, PA-C   chlorpheniramine-HYDROcodone (TUSSIONEX) 10-8 MG/5ML Take 5 mLs by mouth every 12 (twelve) hours as needed for cough. 70 mL Nyoka Lint, PA-C   Spacer/Aero-Holding Chambers DEVI 1 Units by Does not apply route in the morning, at noon, and at bedtime. 1 Units Nyoka Lint, PA-C      I have reviewed the PDMP during this encounter.   Nyoka Lint, PA-C 05/25/22  1109

## 2022-05-25 NOTE — ED Triage Notes (Signed)
Pt c/o cough, sob/dyspnea, reports hx of asthma and bronchitis. States this has been going on for 2 weeks. Audible wheezing in triage. Last used inhaler ~ 0700 today.

## 2022-05-25 NOTE — Telephone Encounter (Signed)
Copied from [place] [phone]. Topic: General - Call Back - No Documentation >> May 25, 2022 10:35 AM Darlene Crosby wrote: Reason for CRM: Please contact the patient further when possible   The patient is uncertain if the call is related to their [facility] visit   Att to contact pt to get appt scheduled no ans lvm

## 2022-07-06 NOTE — Progress Notes (Signed)
Patient ID: IMAHNI ODOM, female    DOB: 1966/02/13  MRN: [id number]  CC: Annual Physical Exam  Subjective: Darlene Crosby is a 57 y.o. female who presents for annual physical exam.   Her concerns today include:  - Doing well on Amlodipine, no issues/concerns. She denies red flag symptoms such as but not limited to chest pain, shortness of breath, worst headache of life, nausea/vomiting. Home blood pressures similar to today's reading.  - Rosuvastatin causing bilateral leg cramps especially at night. She would like to try a different cholesterol medication to see if she has an improvement.  - Would like to begin weight loss medication/pill. She is monitoring what she eats. Reports she recently did a 15 day cleanse from online [facility] [place]).  - Reports bilateral ankle edema began over the weekend after sitting for an extended period of time during her daughter's graduation. She denies associated red flag symptoms. Reports she was told from her orthopedist due to back pain to not sit/stand for extended periods of time because the same would cause ankle swelling.  - No further issues/concerns for discussion today.  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 04/12/2022   Status post lumbar laminectomy 09/20/2021   Body mass index (BMI) 37.0-37.9, adult 03/14/2019   Lumbar stenosis without neurogenic claudication 03/14/2019   Hyperlipidemia 02/08/2019   Lumbar spondylosis 10/25/2018   Degeneration of lumbar intervertebral disc 10/01/2018   Surgical wound dehiscence 06/06/2018   Osteoarthritis of left hip 04/25/2018   Spinal stenosis of lumbar region 02/23/2018   Pain in left knee 01/10/2018   Trigger thumb of right hand 03/30/2017   Symptomatic mammary hypertrophy 11/28/2014   Dyspnea 03/26/2013   Asthma vs VCD  02/28/2013   Hypertensive disorder 06/07/2012   Pyogenic granuloma 02/15/2012     Current  Outpatient Medications on File Prior to Visit  Medication Sig Dispense Refill   acetaminophen (TYLENOL) 650 MG CR tablet Take 1,300 mg by mouth every 8 (eight) hours as needed for pain.     albuterol (VENTOLIN HFA) 108 (90 Base) MCG/ACT inhaler Inhale 2 puffs into the lungs every 4 (four) hours as needed (wheezing and SOB). 8 g 2   Ascorbic Acid (VITAMIN C PO) Take 1 tablet by mouth daily.     benzonatate (TESSALON) 100 MG capsule Take 1 capsule (100 mg total) by mouth 3 (three) times daily as needed for cough. 30 capsule 1   Calcium-Magnesium-Zinc (CAL-MAG-ZINC PO) Take 1 tablet by mouth daily.     chlorpheniramine-HYDROcodone (TUSSIONEX) 10-8 MG/5ML Take 5 mLs by mouth every 12 (twelve) hours as needed for cough. 70 mL 0   Cholecalciferol (VITAMIN D) 50 MCG (2000 UT) tablet Take 2,000 Units by mouth daily.     diclofenac (VOLTAREN) 50 MG EC tablet Take 1 tablet (50 mg total) by mouth 3 (three) times daily. 90 tablet 6   docusate sodium (COLACE) 100 MG capsule Take 1 capsule (100 mg total) by mouth 2 (two) times daily. 40 capsule 0   fluticasone (FLONASE) 50 MCG/ACT nasal spray Place 1 spray into both nostrils daily.     hydrochlorothiazide (HYDRODIURIL) 25 MG tablet Take by mouth.     ipratropium-albuterol (DUONEB) 0.5-2.5 (3) MG/3ML SOLN Inhale 3 mLs into the lungs every 6 (six) hours as needed for shortness of breath.     Menthol, Topical Analgesic, (BIOFREEZE EX) Apply 1 Application topically daily as needed (pain).  methocarbamol (ROBAXIN) 500 MG tablet Take 1 tablet (500 mg total) by mouth every 6 (six) hours as needed for muscle spasms. 90 tablet 6   Probiotic Product (PROBIOTIC PO) Take 1 capsule by mouth daily.     Spacer/Aero-Holding Chambers DEVI 1 Units by Does not apply route in the morning, at noon, and at bedtime. 1 Units 0   Turmeric 500 MG CAPS Take 500 mg by mouth daily.     vitamin B-12 (CYANOCOBALAMIN) 1000 MCG tablet Take 1,000 mcg by mouth daily.      HYDROcodone-acetaminophen (NORCO) 7.5-325 MG tablet Take 1 tablet by mouth every 4 (four) hours as needed for moderate pain. (Patient not taking: Reported on 07/11/2022) 40 tablet 0   predniSONE (DELTASONE) 10 MG tablet Take 1 tablet (10 mg total) by mouth in the morning, at noon, and at bedtime. (Patient not taking: Reported on 07/11/2022) 15 tablet 0   pregabalin (LYRICA) 50 MG capsule Take 1 capsule (50 mg total) by mouth 2 (two) times daily. (Patient not taking: Reported on 07/11/2022) 180 capsule 2   No current facility-administered medications on file prior to visit.    Allergies  Allergen Reactions   Pineapple Shortness Of Breath, Swelling and Hives    Swelling of tongue    Chocolate Hives   Coconut (Cocos Nucifera) Hives and Rash   Strawberry Extract Hives   Wheat Hives   Chocolate Flavor Other (See Comments) and Hives   Pineapple Flavor Hives    Social History   Socioeconomic History   Marital status: Widowed    Spouse name: Not on file   Number of children: Not on file   Years of education: Not on file   Highest education level: Not on file  Occupational History   Not on file  Tobacco Use   Smoking status: Never    Passive exposure: Never   Smokeless tobacco: Never  Vaping Use   Vaping Use: Never used  Substance and Sexual Activity   Alcohol use: Yes    Alcohol/week: 1.0 standard drink of alcohol    Types: 1 Glasses of wine per week   Drug use: Never   Sexual activity: Not on file  Other Topics Concern   Not on file  Social History Narrative   Not on file   Social Determinants of Health   Financial Resource Strain: Not on file  Food Insecurity: Not on file  Transportation Needs: Not on file  Physical Activity: Not on file  Stress: Not on file  Social Connections: Not on file  Intimate Partner Violence: Not on file    Family History  Problem Relation Age of Onset   Hypertension Mother    Asthma Mother    Allergies Mother    Breast cancer Sister     Heart disease Sister    Hypertension Sister    Stroke Sister    Diabetes Sister    Colon cancer Neg Hx    Esophageal cancer Neg Hx    Stomach cancer Neg Hx    Pancreatic cancer Neg Hx     Past Surgical History:  Procedure Laterality Date   ANTERIOR HIP REVISION Left 06/06/2018   Procedure: LEFT HIP WOUND DEHISCENCE POSSIBLE HEAD AND LINER EXCHANGE;  Surgeon: Samson Frederic, MD;  Location: [facility];  Service: Orthopedics;  Laterality: Left;   COLONOSCOPY  2015   DIAGNOSTIC LAPAROSCOPY     tubal preg-took ovary and tube   LAPAROSCOPY FOR ECTOPIC PREGNANCY  1990s   LEFT SALPINGECTOMY AND  RIGHT OOPHORECTOMY FOR ABNORMALITY   LUMBAR LAMINECTOMY N/A 09/20/2021   Procedure: L3-4 AND L4-5 CENTRAL LUMBAR LAMINECTOMIES;  Surgeon: Kerrin Champagne, MD;  Location: [facility] OR;  Service: Orthopedics;  Laterality: N/A;   MASS EXCISION  02/15/2012   Procedure: EXCISION MASS;  Surgeon: Marlowe Shores, MD;  Location: [facility];  Service: Orthopedics;  Laterality: Right;  Excision of Right Small Volar Mass   MYELOGRAM     TONSILLECTOMY  age 66   TOTAL HIP ARTHROPLASTY Left 04/25/2018   Procedure: TOTAL HIP ARTHROPLASTY ANTERIOR APPROACH;  Surgeon: Samson Frederic, MD;  Location: [facility];  Service: Orthopedics;  Laterality: Left;   TRIGGER FINGER RELEASE Right 2018   thumb   WISDOM TOOTH EXTRACTION  age 75    ROS: Review of Systems Negative except as stated above  PHYSICAL EXAM: BP 137/88   Pulse 86   Temp 97.9 F (36.6 C) (Oral)   Resp 16   Ht 5\' 5"  (1.651 m)   Wt 230 lb 9.6 oz (104.6 kg)   LMP 05/04/2012   SpO2 93%   BMI 38.37 kg/m   Physical Exam HENT:     Head: Normocephalic and atraumatic.     Right Ear: Tympanic membrane, ear canal and external ear normal.     Left Ear: Tympanic membrane, ear canal and external ear normal.     Nose: Nose normal.     Mouth/Throat:     Mouth: Mucous membranes are moist.     Pharynx: Oropharynx is clear.  Eyes:     Extraocular  Movements: Extraocular movements intact.     Conjunctiva/sclera: Conjunctivae normal.     Pupils: Pupils are equal, round, and reactive to light.  Cardiovascular:     Rate and Rhythm: Normal rate and regular rhythm.     Pulses: Normal pulses.     Heart sounds: Normal heart sounds.  Pulmonary:     Effort: Pulmonary effort is normal.     Breath sounds: Normal breath sounds.  Chest:     Comments: Patient declined.  Abdominal:     General: Bowel sounds are normal.     Palpations: Abdomen is soft.  Genitourinary:    Comments: Patient declined.  Musculoskeletal:        General: Normal range of motion.     Right shoulder: Normal.     Left shoulder: Normal.     Right upper arm: Normal.     Left upper arm: Normal.     Right elbow: Normal.     Left elbow: Normal.     Right forearm: Normal.     Left forearm: Normal.     Right wrist: Normal.     Left wrist: Normal.     Right hand: Normal.     Left hand: Normal.     Cervical back: Normal, normal range of motion and neck supple.     Thoracic back: Normal.     Lumbar back: Normal.     Right hip: Normal.     Left hip: Normal.     Right upper leg: Normal.     Left upper leg: Normal.     Right knee: Normal.     Left knee: Normal.     Right lower leg: Normal.     Left lower leg: Normal.     Right ankle: Swelling present.     Left ankle: Swelling present.     Right foot: Normal.     Left foot: Normal.  Comments: Bilateral ankles +1 edema with no additional presentation.  Skin:    General: Skin is warm and dry.     Capillary Refill: Capillary refill takes less than 2 seconds.  Neurological:     General: No focal deficit present.     Mental Status: She is alert and oriented to person, place, and time.  Psychiatric:        Mood and Affect: Mood normal.        Behavior: Behavior normal.     ASSESSMENT AND PLAN: 1. Annual physical exam - Counseled on 150 minutes of exercise per week as tolerated, healthy eating (including  decreased daily intake of saturated fats, cholesterol, added sugars, sodium), STI prevention, and routine healthcare maintenance.  2. Screening for metabolic disorder - Routine screening.  - Hepatic Function Panel  3. Screening for iron deficiency anemia - Routine screening.  - CBC  4. Thyroid disorder screen - Routine screening.  - TSH  5. Primary hypertension - Continue Amlodipine as prescribed.  - Counseled on blood pressure goal of less than 130/80, low-sodium, DASH diet, medication compliance, and 150 minutes of moderate intensity exercise per week as tolerated. Counseled on medication adherence and adverse effects. - Follow-up with primary provider in 6 months or sooner if needed.  - amLODipine (NORVASC) 5 MG tablet; Take 1 tablet (5 mg total) by mouth daily.  Dispense: 90 tablet; Refill: 0  6. Hyperlipidemia, unspecified hyperlipidemia type - Rosuvastatin discontinued due to side effect of muscle cramps.  - Trial Atorvastatin as prescribed. Counseled on medication adherence/adverse effects.  - Routine screening.  - Follow-up with primary provider in 4 weeks or sooner if needed.  - Lipid panel - atorvastatin (LIPITOR) 20 MG tablet; Take 1 tablet (20 mg total) by mouth daily.  Dispense: 90 tablet; Refill: 0  7. Encounter for weight management 8. BMI 38.0-38.9,adult - Phentermine as prescribed. Counseled on medication adherence and adverse effects.  - I did check the [facility] prescription drug database. - Counseled on low-sodium DASH diet and 150 minutes of moderate intensity exercise per week as tolerated to assist with weight management.  - Follow-up with primary provider in 4 weeks or sooner if needed. - phentermine 15 MG capsule; Take 1 capsule (15 mg total) by mouth every morning.  Dispense: 30 capsule; Refill: 0    Patient was given the opportunity to ask questions.  Patient verbalized understanding of the plan and was able to repeat key elements of the plan.  Patient was given clear instructions to go to Emergency Department or return to medical center if symptoms don't improve, worsen, or new problems develop.The patient verbalized understanding.   Orders Placed This Encounter  Procedures   Hepatic Function Panel   Lipid panel   CBC   TSH     Requested Prescriptions   Signed Prescriptions Disp Refills   amLODipine (NORVASC) 5 MG tablet 90 tablet 0    Sig: Take 1 tablet (5 mg total) by mouth daily.   atorvastatin (LIPITOR) 20 MG tablet 90 tablet 0    Sig: Take 1 tablet (20 mg total) by mouth daily.   phentermine 15 MG capsule 30 capsule 0    Sig: Take 1 capsule (15 mg total) by mouth every morning.    Return in about 1 year (around 07/11/2023) for Physical per patient preference.  Rema Fendt, NP

## 2022-07-11 ENCOUNTER — Ambulatory Visit (INDEPENDENT_AMBULATORY_CARE_PROVIDER_SITE_OTHER): Payer: 59 | Admitting: Family

## 2022-07-11 ENCOUNTER — Encounter: Payer: Self-pay | Admitting: Family

## 2022-07-11 VITALS — BP 137/88 | HR 86 | Temp 97.9°F | Resp 16 | Ht 65.0 in | Wt 230.6 lb

## 2022-07-11 DIAGNOSIS — Z1329 Encounter for screening for other suspected endocrine disorder: Secondary | ICD-10-CM | POA: Diagnosis not present

## 2022-07-11 DIAGNOSIS — Z130 Encounter for screening for diseases of the blood and blood-forming organs and certain disorders involving the immune mechanism: Secondary | ICD-10-CM | POA: Diagnosis not present

## 2022-07-11 DIAGNOSIS — E785 Hyperlipidemia, unspecified: Secondary | ICD-10-CM | POA: Diagnosis not present

## 2022-07-11 DIAGNOSIS — Z7689 Persons encountering health services in other specified circumstances: Secondary | ICD-10-CM

## 2022-07-11 DIAGNOSIS — E669 Obesity, unspecified: Secondary | ICD-10-CM | POA: Diagnosis not present

## 2022-07-11 DIAGNOSIS — Z13228 Encounter for screening for other metabolic disorders: Secondary | ICD-10-CM

## 2022-07-11 DIAGNOSIS — Z0000 Encounter for general adult medical examination without abnormal findings: Secondary | ICD-10-CM

## 2022-07-11 DIAGNOSIS — Z6838 Body mass index (BMI) 38.0-38.9, adult: Secondary | ICD-10-CM

## 2022-07-11 DIAGNOSIS — I10 Essential (primary) hypertension: Secondary | ICD-10-CM | POA: Diagnosis not present

## 2022-07-11 MED ORDER — AMLODIPINE BESYLATE 5 MG PO TABS: 5.0000 mg | ORAL_TABLET | Freq: Every day | ORAL | 0 refills | Status: DC

## 2022-07-11 MED ORDER — PHENTERMINE HCL 15 MG PO CAPS: 15.0000 mg | ORAL_CAPSULE | ORAL | 0 refills | Status: DC

## 2022-07-11 MED ORDER — ATORVASTATIN CALCIUM 20 MG PO TABS: 20.0000 mg | ORAL_TABLET | Freq: Every day | ORAL | 0 refills | Status: DC

## 2022-07-11 NOTE — Progress Notes (Signed)
-  Patient is here to have annually  complete physical examination  -Care gap address -labs taken  

## 2022-07-11 NOTE — Patient Instructions (Signed)
Preventive Care 40-59 Years Old, Female Preventive care refers to lifestyle choices and visits with your health care provider that can promote health and wellness. Preventive care visits are also called wellness exams. What can I expect for my preventive care visit? Counseling Your health care provider may ask you questions about your: Medical history, including: Past medical problems. Family medical history. Pregnancy history. Current health, including: Menstrual cycle. Method of birth control. Emotional well-being. Home life and relationship well-being. Sexual activity and sexual health. Lifestyle, including: Alcohol, nicotine or tobacco, and drug use. Access to firearms. Diet, exercise, and sleep habits. Work and work environment. Sunscreen use. Safety issues such as seatbelt and bike helmet use. Physical exam Your health care provider will check your: Height and weight. These may be used to calculate your BMI (body mass index). BMI is a measurement that tells if you are at a healthy weight. Waist circumference. This measures the distance around your waistline. This measurement also tells if you are at a healthy weight and may help predict your risk of certain diseases, such as type 2 diabetes and high blood pressure. Heart rate and blood pressure. Body temperature. Skin for abnormal spots. What immunizations do I need?  Vaccines are usually given at various ages, according to a schedule. Your health care provider will recommend vaccines for you based on your age, medical history, and lifestyle or other factors, such as travel or where you work. What tests do I need? Screening Your health care provider may recommend screening tests for certain conditions. This may include: Lipid and cholesterol levels. Diabetes screening. This is done by checking your blood sugar (glucose) after you have not eaten for a while (fasting). Pelvic exam and Pap test. Hepatitis B test. Hepatitis C  test. HIV (human immunodeficiency virus) test. STI (sexually transmitted infection) testing, if you are at risk. Lung cancer screening. Colorectal cancer screening. Mammogram. Talk with your health care provider about when you should start having regular mammograms. This may depend on whether you have a family history of breast cancer. BRCA-related cancer screening. This may be done if you have a family history of breast, ovarian, tubal, or peritoneal cancers. Bone density scan. This is done to screen for osteoporosis. Talk with your health care provider about your test results, treatment options, and if necessary, the need for more tests. Follow these instructions at home: Eating and drinking  Eat a diet that includes fresh fruits and vegetables, whole grains, lean protein, and low-fat dairy products. Take vitamin and mineral supplements as recommended by your health care provider. Do not drink alcohol if: Your health care provider tells you not to drink. You are pregnant, may be pregnant, or are planning to become pregnant. If you drink alcohol: Limit how much you have to 0-1 drink a day. Know how much alcohol is in your drink. In the [place] one drink equals one 12 oz bottle of beer (355 mL), one 5 oz glass of wine (148 mL), or one 1 oz glass of hard liquor (44 mL). Lifestyle Brush your teeth every morning and night with fluoride toothpaste. Floss one time each day. Exercise for at least 30 minutes 5 or more days each week. Do not use any products that contain nicotine or tobacco. These products include cigarettes, chewing tobacco, and vaping devices, such as e-cigarettes. If you need help quitting, ask your health care provider. Do not use drugs. If you are sexually active, practice safe sex. Use a condom or other form of protection to   prevent STIs. If you do not wish to become pregnant, use a form of birth control. If you plan to become pregnant, see your health care provider for a  prepregnancy visit. Take aspirin only as told by your health care provider. Make sure that you understand how much to take and what form to take. Work with your health care provider to find out whether it is safe and beneficial for you to take aspirin daily. Find healthy ways to manage stress, such as: Meditation, yoga, or listening to music. Journaling. Talking to a trusted person. Spending time with friends and family. Minimize exposure to UV radiation to reduce your risk of skin cancer. Safety Always wear your seat belt while driving or riding in a vehicle. Do not drive: If you have been drinking alcohol. Do not ride with someone who has been drinking. When you are tired or distracted. While texting. If you have been using any mind-altering substances or drugs. Wear a helmet and other protective equipment during sports activities. If you have firearms in your house, make sure you follow all gun safety procedures. Seek help if you have been physically or sexually abused. What's next? Visit your health care provider once a year for an annual wellness visit. Ask your health care provider how often you should have your eyes and teeth checked. Stay up to date on all vaccines. This information is not intended to replace advice given to you by your health care provider. Make sure you discuss any questions you have with your health care provider. Document Revised: 08/12/2020 Document Reviewed: 08/12/2020 Elsevier Patient Education  2023 [employer]  

## 2022-07-12 LAB — LIPID PANEL
Chol/HDL Ratio: 3.4 ratio (ref 0.0–4.4)
Cholesterol, Total: 147 mg/dL (ref 100–199)
HDL: 43 mg/dL (ref >39)
LDL Chol Calc (NIH): 78 mg/dL (ref 0–99)
Triglycerides: 149 mg/dL (ref 0–149)
VLDL Cholesterol Cal: 26 mg/dL (ref 5–40)

## 2022-07-12 LAB — HEPATIC FUNCTION PANEL
ALT: 29 IU/L (ref 0–32)
AST: 24 IU/L (ref 0–40)
Albumin: 4.2 g/dL (ref 3.8–4.9)
Alkaline Phosphatase: 105 IU/L (ref 44–121)
Bilirubin Total: 0.3 mg/dL (ref 0.0–1.2)
Bilirubin, Direct: <0.1 mg/dL (ref 0.00–0.40)
Total Protein: 7.4 g/dL (ref 6.0–8.5)

## 2022-07-12 LAB — CBC
Hematocrit: 43 % (ref 34.0–46.6)
Hemoglobin: 14.7 g/dL (ref 11.1–15.9)
MCH: 28.1 pg (ref 26.6–33.0)
MCHC: 34.2 g/dL (ref 31.5–35.7)
MCV: 82 fL (ref 79–97)
Platelets: 328 10*3/uL (ref 150–450)
RBC: 5.23 x10E6/uL (ref 3.77–5.28)
RDW: 13.9 % (ref 11.7–15.4)
WBC: 7.6 10*3/uL (ref 3.4–10.8)

## 2022-07-12 LAB — TSH: TSH: 2.08 u[IU]/mL (ref 0.450–4.500)

## 2022-08-05 NOTE — Progress Notes (Unsigned)
Patient ID: Darlene Crosby, female    DOB: 08/18/1965  MRN: [id number]  CC: Weight Check   Subjective: Darlene Crosby is a 57 y.o. female who presents for weight check.   Her concerns today include:  Phentermine HLD - Atorvastatin   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 04/12/2022   Status post lumbar laminectomy 09/20/2021   Body mass index (BMI) 37.0-37.9, adult 03/14/2019   Lumbar stenosis without neurogenic claudication 03/14/2019   Hyperlipidemia 02/08/2019   Lumbar spondylosis 10/25/2018   Degeneration of lumbar intervertebral disc 10/01/2018   Surgical wound dehiscence 06/06/2018   Osteoarthritis of left hip 04/25/2018   Spinal stenosis of lumbar region 02/23/2018   Pain in left knee 01/10/2018   Trigger thumb of right hand 03/30/2017   Symptomatic mammary hypertrophy 11/28/2014   Dyspnea 03/26/2013   Asthma vs VCD  02/28/2013   Hypertensive disorder 06/07/2012   Pyogenic granuloma 02/15/2012     Current Outpatient Medications on File Prior to Visit  Medication Sig Dispense Refill   acetaminophen (TYLENOL) 650 MG CR tablet Take 1,300 mg by mouth every 8 (eight) hours as needed for pain.     albuterol (VENTOLIN HFA) 108 (90 Base) MCG/ACT inhaler Inhale 2 puffs into the lungs every 4 (four) hours as needed (wheezing and SOB). 8 g 2   amLODipine (NORVASC) 5 MG tablet Take 1 tablet (5 mg total) by mouth daily. 90 tablet 0   Ascorbic Acid (VITAMIN C PO) Take 1 tablet by mouth daily.     atorvastatin (LIPITOR) 20 MG tablet Take 1 tablet (20 mg total) by mouth daily. 90 tablet 0   benzonatate (TESSALON) 100 MG capsule Take 1 capsule (100 mg total) by mouth 3 (three) times daily as needed for cough. 30 capsule 1   Calcium-Magnesium-Zinc (CAL-MAG-ZINC PO) Take 1 tablet by mouth daily.     chlorpheniramine-HYDROcodone (TUSSIONEX) 10-8 MG/5ML Take 5 mLs by mouth every 12 (twelve) hours as needed  for cough. 70 mL 0   Cholecalciferol (VITAMIN D) 50 MCG (2000 UT) tablet Take 2,000 Units by mouth daily.     diclofenac (VOLTAREN) 50 MG EC tablet Take 1 tablet (50 mg total) by mouth 3 (three) times daily. 90 tablet 6   docusate sodium (COLACE) 100 MG capsule Take 1 capsule (100 mg total) by mouth 2 (two) times daily. 40 capsule 0   fluticasone (FLONASE) 50 MCG/ACT nasal spray Place 1 spray into both nostrils daily.     hydrochlorothiazide (HYDRODIURIL) 25 MG tablet Take by mouth.     HYDROcodone-acetaminophen (NORCO) 7.5-325 MG tablet Take 1 tablet by mouth every 4 (four) hours as needed for moderate pain. (Patient not taking: Reported on 07/11/2022) 40 tablet 0   ipratropium-albuterol (DUONEB) 0.5-2.5 (3) MG/3ML SOLN Inhale 3 mLs into the lungs every 6 (six) hours as needed for shortness of breath.     Menthol, Topical Analgesic, (BIOFREEZE EX) Apply 1 Application topically daily as needed (pain).     methocarbamol (ROBAXIN) 500 MG tablet Take 1 tablet (500 mg total) by mouth every 6 (six) hours as needed for muscle spasms. 90 tablet 6   phentermine 15 MG capsule Take 1 capsule (15 mg total) by mouth every morning. 30 capsule 0   predniSONE (DELTASONE) 10 MG tablet Take 1 tablet (10 mg total) by mouth in the morning, at noon, and at bedtime. (Patient not taking: Reported on 07/11/2022) 15 tablet 0  pregabalin (LYRICA) 50 MG capsule Take 1 capsule (50 mg total) by mouth 2 (two) times daily. (Patient not taking: Reported on 07/11/2022) 180 capsule 2   Probiotic Product (PROBIOTIC PO) Take 1 capsule by mouth daily.     Spacer/Aero-Holding Chambers DEVI 1 Units by Does not apply route in the morning, at noon, and at bedtime. 1 Units 0   Turmeric 500 MG CAPS Take 500 mg by mouth daily.     vitamin B-12 (CYANOCOBALAMIN) 1000 MCG tablet Take 1,000 mcg by mouth daily.     No current facility-administered medications on file prior to visit.    Allergies  Allergen Reactions   Pineapple Shortness Of  Breath, Swelling and Hives    Swelling of tongue    Chocolate Hives   Coconut (Cocos Nucifera) Hives and Rash   Strawberry Extract Hives   Wheat Hives   Chocolate Flavor Other (See Comments) and Hives   Pineapple Flavor Hives    Social History   Socioeconomic History   Marital status: Widowed    Spouse name: Not on file   Number of children: Not on file   Years of education: Not on file   Highest education level: Not on file  Occupational History   Not on file  Tobacco Use   Smoking status: Never    Passive exposure: Never   Smokeless tobacco: Never  Vaping Use   Vaping Use: Never used  Substance and Sexual Activity   Alcohol use: Yes    Alcohol/week: 1.0 standard drink of alcohol    Types: 1 Glasses of wine per week   Drug use: Never   Sexual activity: Not on file  Other Topics Concern   Not on file  Social History Narrative   Not on file   Social Determinants of Health   Financial Resource Strain: Not on file  Food Insecurity: Not on file  Transportation Needs: Not on file  Physical Activity: Not on file  Stress: Not on file  Social Connections: Not on file  Intimate Partner Violence: Not on file    Family History  Problem Relation Age of Onset   Hypertension Mother    Asthma Mother    Allergies Mother    Breast cancer Sister    Heart disease Sister    Hypertension Sister    Stroke Sister    Diabetes Sister    Colon cancer Neg Hx    Esophageal cancer Neg Hx    Stomach cancer Neg Hx    Pancreatic cancer Neg Hx     Past Surgical History:  Procedure Laterality Date   ANTERIOR HIP REVISION Left 06/06/2018   Procedure: LEFT HIP WOUND DEHISCENCE POSSIBLE HEAD AND LINER EXCHANGE;  Surgeon: Samson Frederic, MD;  Location: [facility];  Service: Orthopedics;  Laterality: Left;   COLONOSCOPY  2015   DIAGNOSTIC LAPAROSCOPY     tubal preg-took ovary and tube   LAPAROSCOPY FOR ECTOPIC PREGNANCY  1990s   LEFT SALPINGECTOMY AND RIGHT OOPHORECTOMY FOR ABNORMALITY    LUMBAR LAMINECTOMY N/A 09/20/2021   Procedure: L3-4 AND L4-5 CENTRAL LUMBAR LAMINECTOMIES;  Surgeon: Kerrin Champagne, MD;  Location: [facility];  Service: Orthopedics;  Laterality: N/A;   MASS EXCISION  02/15/2012   Procedure: EXCISION MASS;  Surgeon: Marlowe Shores, MD;  Location: [facility];  Service: Orthopedics;  Laterality: Right;  Excision of Right Small Volar Mass   MYELOGRAM     TONSILLECTOMY  age 10   TOTAL HIP ARTHROPLASTY Left 04/25/2018  Procedure: TOTAL HIP ARTHROPLASTY ANTERIOR APPROACH;  Surgeon: Samson Frederic, MD;  Location: [facility];  Service: Orthopedics;  Laterality: Left;   TRIGGER FINGER RELEASE Right 2018   thumb   WISDOM TOOTH EXTRACTION  age 77    ROS: Review of Systems Negative except as stated above  PHYSICAL EXAM: LMP 05/04/2012   Physical Exam  {female adult master:310786} {female adult master:310785}     Latest Ref Rng & Units 07/11/2022   10:46 AM 04/12/2022    8:29 PM 09/15/2021   11:24 AM  CMP  Glucose 70 - 99 mg/dL  86  98   BUN 6 - 24 mg/dL  14  12   Creatinine 1.61 - 1.00 mg/dL  0.96  0.45   Sodium 409 - 144 mmol/L  140  138   Potassium 3.5 - 5.2 mmol/L  4.0  3.4   Chloride 96 - 106 mmol/L  102  108   CO2 20 - 29 mmol/L  22  24   Calcium 8.7 - 10.2 mg/dL  9.7  9.2   Total Protein 6.0 - 8.5 g/dL 7.4     Total Bilirubin 0.0 - 1.2 mg/dL 0.3     Alkaline Phos 44 - 121 IU/L 105     AST 0 - 40 IU/L 24     ALT 0 - 32 IU/L 29      Lipid Panel     Component Value Date/Time   CHOL 147 07/11/2022 1046   TRIG 149 07/11/2022 1046   HDL 43 07/11/2022 1046   CHOLHDL 3.4 07/11/2022 1046   LDLCALC 78 07/11/2022 1046    CBC    Component Value Date/Time   WBC 7.6 07/11/2022 1046   WBC 13.3 (H) 09/21/2021 0458   RBC 5.23 07/11/2022 1046   RBC 4.18 09/21/2021 0458   HGB 14.7 07/11/2022 1046   HCT 43.0 07/11/2022 1046   PLT 328 07/11/2022 1046   MCV 82 07/11/2022 1046   MCH 28.1 07/11/2022 1046   MCH 28.5 09/21/2021 0458    MCHC 34.2 07/11/2022 1046   MCHC 34.5 09/21/2021 0458   RDW 13.9 07/11/2022 1046   LYMPHSABS 2.3 09/16/2010 1010   MONOABS 0.8 09/16/2010 1010   EOSABS 0.1 09/16/2010 1010   BASOSABS 0.0 09/16/2010 1010    ASSESSMENT AND PLAN:  There are no diagnoses linked to this encounter.   Patient was given the opportunity to ask questions.  Patient verbalized understanding of the plan and was able to repeat key elements of the plan. Patient was given clear instructions to go to Emergency Department or return to medical center if symptoms don't improve, worsen, or new problems develop.The patient verbalized understanding.   No orders of the defined types were placed in this encounter.    Requested Prescriptions    No prescriptions requested or ordered in this encounter    No follow-ups on file.  Rema Fendt, NP

## 2022-08-08 ENCOUNTER — Ambulatory Visit (INDEPENDENT_AMBULATORY_CARE_PROVIDER_SITE_OTHER): Payer: 59 | Admitting: Family

## 2022-08-08 VITALS — BP 124/83 | HR 90 | Temp 97.6°F | Resp 16 | Wt 230.6 lb

## 2022-08-08 DIAGNOSIS — Z7689 Persons encountering health services in other specified circumstances: Secondary | ICD-10-CM

## 2022-08-08 DIAGNOSIS — Z6838 Body mass index (BMI) 38.0-38.9, adult: Secondary | ICD-10-CM

## 2022-08-08 DIAGNOSIS — E669 Obesity, unspecified: Secondary | ICD-10-CM

## 2022-08-08 DIAGNOSIS — E785 Hyperlipidemia, unspecified: Secondary | ICD-10-CM

## 2022-08-08 MED ORDER — PHENTERMINE HCL 30 MG PO CAPS: 30.0000 mg | ORAL_CAPSULE | ORAL | 0 refills | Status: DC

## 2022-08-08 NOTE — Progress Notes (Signed)
Patient came in for monthly weight check. Patient has no other concerns today  

## 2022-08-09 ENCOUNTER — Telehealth: Payer: Self-pay | Admitting: Family

## 2022-08-09 LAB — LIPID PANEL
Chol/HDL Ratio: 3.5 ratio (ref 0.0–4.4)
Cholesterol, Total: 137 mg/dL (ref 100–199)
HDL: 39 mg/dL — ABNORMAL LOW (ref >39)
LDL Chol Calc (NIH): 77 mg/dL (ref 0–99)
Triglycerides: 118 mg/dL (ref 0–149)
VLDL Cholesterol Cal: 21 mg/dL (ref 5–40)

## 2022-08-09 NOTE — Telephone Encounter (Signed)
Pt. Given lab results and instructions, verbalizes understanding. 

## 2022-08-29 ENCOUNTER — Other Ambulatory Visit (HOSPITAL_COMMUNITY)
Admission: RE | Admit: 2022-08-29 | Discharge: 2022-08-29 | Disposition: A | Discharge status: Home / Self Care | Payer: 59 | Source: Ambulatory Visit | Attending: Obstetrics and Gynecology | Admitting: Obstetrics and Gynecology

## 2022-08-29 ENCOUNTER — Other Ambulatory Visit: Payer: Self-pay | Admitting: Obstetrics and Gynecology

## 2022-08-29 DIAGNOSIS — Z1151 Encounter for screening for human papillomavirus (HPV): Secondary | ICD-10-CM | POA: Insufficient documentation

## 2022-08-29 DIAGNOSIS — Z01419 Encounter for gynecological examination (general) (routine) without abnormal findings: Secondary | ICD-10-CM | POA: Insufficient documentation

## 2022-08-31 LAB — CYTOLOGY - PAP
Comment: NEGATIVE
Diagnosis: UNDETERMINED — AB
High risk HPV: NEGATIVE

## 2022-09-09 ENCOUNTER — Ambulatory Visit: Payer: Commercial Managed Care - HMO | Admitting: Orthopedic Surgery

## 2022-09-12 ENCOUNTER — Ambulatory Visit: Payer: Commercial Managed Care - HMO | Admitting: Orthopedic Surgery

## 2022-09-14 ENCOUNTER — Ambulatory Visit (INDEPENDENT_AMBULATORY_CARE_PROVIDER_SITE_OTHER): Payer: 59 | Admitting: Orthopedic Surgery

## 2022-09-14 ENCOUNTER — Other Ambulatory Visit: Payer: Self-pay | Admitting: Family

## 2022-09-14 ENCOUNTER — Other Ambulatory Visit (INDEPENDENT_AMBULATORY_CARE_PROVIDER_SITE_OTHER): Payer: 59

## 2022-09-14 DIAGNOSIS — M48062 Spinal stenosis, lumbar region with neurogenic claudication: Secondary | ICD-10-CM

## 2022-09-14 DIAGNOSIS — Z6838 Body mass index (BMI) 38.0-38.9, adult: Secondary | ICD-10-CM

## 2022-09-14 DIAGNOSIS — Z7689 Persons encountering health services in other specified circumstances: Secondary | ICD-10-CM

## 2022-09-14 MED ORDER — PREGABALIN 75 MG PO CAPS: 75.0000 mg | ORAL_CAPSULE | Freq: Two times a day (BID) | ORAL | 2 refills | Status: DC

## 2022-09-14 MED ORDER — METHOCARBAMOL 500 MG PO TABS: 500.0000 mg | ORAL_TABLET | Freq: Four times a day (QID) | ORAL | 2 refills | Status: DC | PRN

## 2022-09-14 NOTE — Telephone Encounter (Signed)
Schedule weight check appointment.

## 2022-09-14 NOTE — Progress Notes (Signed)
Orthopedic Spine Surgery Office Note   Assessment: Patient is a 57 y.o. female status with chronic stable low back pain. Had neurogenic claudication as well and is status post L3/4 and L4/5 laminectomies (~1 year post-op)     Plan: -Operative plans complete -Out of bed as tolerated, no brace -Pain control: OTC medications -Prescribed Robaxin and Lyrica for additional pain relief -Discussed using Voltaren gel to desensitize the area on her low back that is tender to light touch -Patient should return to office in 6 months, x-rays at next visit: AP/lateral/flex/ex lumbar     Patient expressed understanding of the plan and all questions were answered to the patient's satisfaction.    ___________________________________________________________________________     History:   Patient is a 56 y.o. female who presents today for routine follow-up on her lumbar spine.  She is now approximately 1 year out from L3/4 and L4/5 laminectomies with my retired partner, Dr. Otelia Sergeant.  Her surgery was on 09/16/2021.  She still feels that the pain in her legs has gotten better since surgery.  However she is still having significant low back pain.  She feels it on a daily basis.  She notes it is worse if she is particularly active.  She gives the example of cleaning her house the other day which aggravated her pain.  She has been able to walk about 2 miles a day but feels that this amount aggravates it as well.  In the last couple of months, she has noted tenderness to even light touch over the right paraspinal muscles which has made her back pain progressively worse. Denies paresthesias and numbness.     Physical Exam:   General: no acute distress, appears stated age Neurologic: alert, answering questions appropriately, following commands Respiratory: unlabored breathing on room air, symmetric chest rise Psychiatric: appropriate affect, normal cadence to speech   Posterior lumbar incision appears well healed  with no evidence of infection   MSK (spine):   -Strength exam                                                   Left                  Right EHL                              5/5                  5/5 TA                                 5/5                  5/5 GSC                             5/5                  5/5 Knee extension            5/5                  5/5 Hip flexion  5/5                  4+/5   -Sensory exam                           Sensation intact to light touch in L3-S1 nerve distributions of bilateral lower extremities   Imaging: XR of the lumbar spine from 09/14/2022 was independently reviewed and interpreted, showing laminectomy defect from L3-5. No evidence of instability on flexion/extension views. Disc height loss with anterior osteophyte formation at multiple levels. No fracture or dislocation seen.     Patient name: Darlene Crosby Patient MRN: [id number] Date of visit: 09/14/22

## 2022-09-28 ENCOUNTER — Ambulatory Visit (INDEPENDENT_AMBULATORY_CARE_PROVIDER_SITE_OTHER): Payer: 59 | Admitting: Family

## 2022-09-28 ENCOUNTER — Encounter: Payer: Self-pay | Admitting: Family

## 2022-09-28 VITALS — BP 144/84 | HR 72 | Temp 98.1°F | Ht 65.0 in | Wt 228.4 lb

## 2022-09-28 DIAGNOSIS — Z6838 Body mass index (BMI) 38.0-38.9, adult: Secondary | ICD-10-CM | POA: Diagnosis not present

## 2022-09-28 DIAGNOSIS — Z7689 Persons encountering health services in other specified circumstances: Secondary | ICD-10-CM | POA: Diagnosis not present

## 2022-09-28 DIAGNOSIS — E669 Obesity, unspecified: Secondary | ICD-10-CM

## 2022-09-28 MED ORDER — PHENTERMINE HCL 37.5 MG PO CAPS: 37.5000 mg | ORAL_CAPSULE | ORAL | 0 refills | Status: DC

## 2022-09-28 NOTE — Progress Notes (Signed)
Pt needs diet pill refill.

## 2022-09-28 NOTE — Progress Notes (Signed)
Patient ID: Darlene Crosby, female    DOB: Jul 07, 1965  MRN: [id number]  CC: Weight Check  Subjective: Darlene Crosby is a 57 y.o. female who presents for weight check.   Her concerns today include:  - Doing well on Phentermine, no issues/concerns.  - States she forgot to take her blood pressure medication this morning. She denies associated red flag symptoms.   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 04/12/2022   Status post lumbar laminectomy 09/20/2021   Body mass index (BMI) 37.0-37.9, adult 03/14/2019   Lumbar stenosis without neurogenic claudication 03/14/2019   Hyperlipidemia 02/08/2019   Lumbar spondylosis 10/25/2018   Degeneration of lumbar intervertebral disc 10/01/2018   Surgical wound dehiscence 06/06/2018   Osteoarthritis of left hip 04/25/2018   Spinal stenosis of lumbar region 02/23/2018   Pain in left knee 01/10/2018   Trigger thumb of right hand 03/30/2017   Symptomatic mammary hypertrophy 11/28/2014   Dyspnea 03/26/2013   Asthma vs VCD  02/28/2013   Hypertensive disorder 06/07/2012   Pyogenic granuloma 02/15/2012     Current Outpatient Medications on File Prior to Visit  Medication Sig Dispense Refill   acetaminophen (TYLENOL) 650 MG CR tablet Take 1,300 mg by mouth every 8 (eight) hours as needed for pain.     albuterol (VENTOLIN HFA) 108 (90 Base) MCG/ACT inhaler Inhale 2 puffs into the lungs every 4 (four) hours as needed (wheezing and SOB). 8 g 2   amLODipine (NORVASC) 5 MG tablet Take 1 tablet (5 mg total) by mouth daily. 90 tablet 0   Ascorbic Acid (VITAMIN C PO) Take 1 tablet by mouth daily.     atorvastatin (LIPITOR) 20 MG tablet Take 1 tablet (20 mg total) by mouth daily. 90 tablet 0   Calcium-Magnesium-Zinc (CAL-MAG-ZINC PO) Take 1 tablet by mouth daily.     Cholecalciferol (VITAMIN D) 50 MCG (2000 UT) tablet Take 2,000 Units by mouth daily.     diclofenac (VOLTAREN)  50 MG EC tablet Take 1 tablet (50 mg total) by mouth 3 (three) times daily. 90 tablet 6   fluticasone (FLONASE) 50 MCG/ACT nasal spray Place 1 spray into both nostrils daily.     hydrochlorothiazide (HYDRODIURIL) 25 MG tablet Take by mouth.     ipratropium-albuterol (DUONEB) 0.5-2.5 (3) MG/3ML SOLN Inhale 3 mLs into the lungs every 6 (six) hours as needed for shortness of breath.     levocetirizine (XYZAL) 5 MG tablet SMARTSIG:1 Tablet(s) By Mouth Every Evening     methocarbamol (ROBAXIN) 500 MG tablet Take 1 tablet (500 mg total) by mouth every 6 (six) hours as needed (muscle spasms, pain). 50 tablet 2   pregabalin (LYRICA) 75 MG capsule Take 1 capsule (75 mg total) by mouth 2 (two) times daily. 60 capsule 2   Spacer/Aero-Holding Chambers DEVI 1 Units by Does not apply route in the morning, at noon, and at bedtime. 1 Units 0   Turmeric 500 MG CAPS Take 500 mg by mouth daily.     vitamin B-12 (CYANOCOBALAMIN) 1000 MCG tablet Take 1,000 mcg by mouth daily.     benzonatate (TESSALON) 100 MG capsule Take 1 capsule (100 mg total) by mouth 3 (three) times daily as needed for cough. 30 capsule 1   chlorpheniramine-HYDROcodone (TUSSIONEX) 10-8 MG/5ML Take 5 mLs by mouth every 12 (twelve) hours as needed for cough. 70 mL 0   docusate sodium (COLACE) 100 MG capsule Take 1  capsule (100 mg total) by mouth 2 (two) times daily. 40 capsule 0   HYDROcodone-acetaminophen (NORCO) 7.5-325 MG tablet Take 1 tablet by mouth every 4 (four) hours as needed for moderate pain. 40 tablet 0   Menthol, Topical Analgesic, (BIOFREEZE EX) Apply 1 Application topically daily as needed (pain). (Patient not taking: Reported on 09/28/2022)     predniSONE (DELTASONE) 10 MG tablet Take 1 tablet (10 mg total) by mouth in the morning, at noon, and at bedtime. 15 tablet 0   Probiotic Product (PROBIOTIC PO) Take 1 capsule by mouth daily. (Patient not taking: Reported on 09/28/2022)     No current facility-administered medications on file  prior to visit.    Allergies  Allergen Reactions   Pineapple Shortness Of Breath, Swelling and Hives    Swelling of tongue    Chocolate Hives   Coconut (Cocos Nucifera) Hives and Rash   Strawberry Extract Hives   Wheat Hives   Chocolate Flavor Other (See Comments) and Hives   Pineapple Flavor Hives    Social History   Socioeconomic History   Marital status: Widowed    Spouse name: Not on file   Number of children: Not on file   Years of education: Not on file   Highest education level: Not on file  Occupational History   Not on file  Tobacco Use   Smoking status: Never    Passive exposure: Never   Smokeless tobacco: Never  Vaping Use   Vaping status: Never Used  Substance and Sexual Activity   Alcohol use: Yes    Alcohol/week: 1.0 standard drink of alcohol    Types: 1 Glasses of wine per week   Drug use: Never   Sexual activity: Not on file  Other Topics Concern   Not on file  Social History Narrative   Not on file   Social Determinants of Health   Financial Resource Strain: Not on file  Food Insecurity: Not on file  Transportation Needs: Not on file  Physical Activity: Not on file  Stress: Not on file  Social Connections: Not on file  Intimate Partner Violence: Not on file    Family History  Problem Relation Age of Onset   Hypertension Mother    Asthma Mother    Allergies Mother    Breast cancer Sister    Heart disease Sister    Hypertension Sister    Stroke Sister    Diabetes Sister    Colon cancer Neg Hx    Esophageal cancer Neg Hx    Stomach cancer Neg Hx    Pancreatic cancer Neg Hx     Past Surgical History:  Procedure Laterality Date   ANTERIOR HIP REVISION Left 06/06/2018   Procedure: LEFT HIP WOUND DEHISCENCE POSSIBLE HEAD AND LINER EXCHANGE;  Surgeon: Samson Frederic, MD;  Location: [facility];  Service: Orthopedics;  Laterality: Left;   COLONOSCOPY  2015   DIAGNOSTIC LAPAROSCOPY     tubal preg-took ovary and tube   LAPAROSCOPY FOR  ECTOPIC PREGNANCY  1990s   LEFT SALPINGECTOMY AND RIGHT OOPHORECTOMY FOR ABNORMALITY   LUMBAR LAMINECTOMY N/A 09/20/2021   Procedure: L3-4 AND L4-5 CENTRAL LUMBAR LAMINECTOMIES;  Surgeon: Kerrin Champagne, MD;  Location: [facility];  Service: Orthopedics;  Laterality: N/A;   MASS EXCISION  02/15/2012   Procedure: EXCISION MASS;  Surgeon: Marlowe Shores, MD;  Location: [facility];  Service: Orthopedics;  Laterality: Right;  Excision of Right Small Volar Mass   MYELOGRAM  TONSILLECTOMY  age 51   TOTAL HIP ARTHROPLASTY Left 04/25/2018   Procedure: TOTAL HIP ARTHROPLASTY ANTERIOR APPROACH;  Surgeon: Samson Frederic, MD;  Location: [facility];  Service: Orthopedics;  Laterality: Left;   TRIGGER FINGER RELEASE Right 2018   thumb   WISDOM TOOTH EXTRACTION  age 53    ROS: Review of Systems Negative except as stated above  PHYSICAL EXAM: BP (!) 144/84   Pulse 72   Temp 98.1 F (36.7 C) (Oral)   Ht 5\' 5"  (1.651 m)   Wt 228 lb 6.4 oz (103.6 kg)   LMP 05/04/2012   SpO2 96%   BMI 38.01 kg/m   Wt Readings from Last 3 Encounters:  09/28/22 228 lb 6.4 oz (103.6 kg)  08/08/22 230 lb 9.6 oz (104.6 kg)  07/11/22 230 lb 9.6 oz (104.6 kg)   Physical Exam HENT:     Head: Normocephalic and atraumatic.     Nose: Nose normal.     Mouth/Throat:     Mouth: Mucous membranes are moist.     Pharynx: Oropharynx is clear.  Eyes:     Extraocular Movements: Extraocular movements intact.     Conjunctiva/sclera: Conjunctivae normal.     Pupils: Pupils are equal, round, and reactive to light.  Cardiovascular:     Rate and Rhythm: Normal rate and regular rhythm.     Pulses: Normal pulses.     Heart sounds: Normal heart sounds.  Pulmonary:     Effort: Pulmonary effort is normal.     Breath sounds: Normal breath sounds.  Musculoskeletal:        General: Normal range of motion.     Cervical back: Normal range of motion and neck supple.  Neurological:     General: No focal deficit present.      Mental Status: She is alert and oriented to person, place, and time.  Psychiatric:        Mood and Affect: Mood normal.        Behavior: Behavior normal.     ASSESSMENT AND PLAN: 1. Encounter for weight management 2. BMI 38.0-38.9,adult - Patient lost 2 pounds since previous office visit. Commended her on this.  - Increase Phentermine from 30 mg daily to 37.5 mg daily. Counseled on medication adherence/adverse effects.  - Follow-up with primary provider in 4 weeks or sooner if needed for weight check. - phentermine 37.5 MG capsule; Take 1 capsule (37.5 mg total) by mouth every morning.  Dispense: 30 capsule; Refill: 0    Patient was given the opportunity to ask questions.  Patient verbalized understanding of the plan and was able to repeat key elements of the plan. Patient was given clear instructions to go to Emergency Department or return to medical center if symptoms don't improve, worsen, or new problems develop.The patient verbalized understanding.   Requested Prescriptions   Signed Prescriptions Disp Refills   phentermine 37.5 MG capsule 30 capsule 0    Sig: Take 1 capsule (37.5 mg total) by mouth every morning.    Return in about 4 weeks (around 10/26/2022) for Follow-Up or next available wieght check .  Rema Fendt, NP

## 2022-09-29 ENCOUNTER — Other Ambulatory Visit: Payer: Self-pay | Admitting: Obstetrics and Gynecology

## 2022-10-13 ENCOUNTER — Other Ambulatory Visit: Payer: Self-pay | Admitting: Family

## 2022-10-13 DIAGNOSIS — Z1231 Encounter for screening mammogram for malignant neoplasm of breast: Secondary | ICD-10-CM

## 2022-10-24 ENCOUNTER — Other Ambulatory Visit: Payer: Self-pay | Admitting: Family

## 2022-10-24 ENCOUNTER — Other Ambulatory Visit: Payer: Self-pay

## 2022-10-24 DIAGNOSIS — J3089 Other allergic rhinitis: Secondary | ICD-10-CM

## 2022-10-24 MED ORDER — LEVOCETIRIZINE DIHYDROCHLORIDE 5 MG PO TABS: 5.0000 mg | ORAL_TABLET | Freq: Every evening | ORAL | 0 refills | Status: DC

## 2022-10-25 ENCOUNTER — Telehealth: Payer: Self-pay | Admitting: Orthopedic Surgery

## 2022-10-25 MED ORDER — METHOCARBAMOL 500 MG PO TABS: 500.0000 mg | ORAL_TABLET | Freq: Four times a day (QID) | ORAL | 2 refills | Status: DC | PRN

## 2022-10-25 MED ORDER — DICLOFENAC SODIUM 50 MG PO TBEC: 50.0000 mg | DELAYED_RELEASE_TABLET | Freq: Three times a day (TID) | ORAL | 3 refills | Status: DC

## 2022-10-25 MED ORDER — PREGABALIN 75 MG PO CAPS: 75.0000 mg | ORAL_CAPSULE | Freq: Two times a day (BID) | ORAL | 2 refills | Status: DC

## 2022-10-25 NOTE — Telephone Encounter (Signed)
Patient's pharmacy wanted long-term meds sent to home delivery which was done today through this encounter

## 2022-10-28 ENCOUNTER — Other Ambulatory Visit: Payer: Self-pay

## 2022-10-28 DIAGNOSIS — I10 Essential (primary) hypertension: Secondary | ICD-10-CM

## 2022-10-28 MED ORDER — AMLODIPINE BESYLATE 5 MG PO TABS: 5.0000 mg | ORAL_TABLET | Freq: Every day | ORAL | 0 refills | Status: DC

## 2022-11-18 ENCOUNTER — Ambulatory Visit
Admission: RE | Admit: 2022-11-18 | Discharge: 2022-11-18 | Disposition: A | Discharge status: Home / Self Care | Payer: 59 | Source: Ambulatory Visit | Attending: Family | Admitting: Family

## 2022-11-18 DIAGNOSIS — Z1231 Encounter for screening mammogram for malignant neoplasm of breast: Secondary | ICD-10-CM

## 2022-12-27 ENCOUNTER — Other Ambulatory Visit: Payer: Self-pay

## 2022-12-27 ENCOUNTER — Other Ambulatory Visit: Payer: Self-pay | Admitting: Family

## 2022-12-27 DIAGNOSIS — J3089 Other allergic rhinitis: Secondary | ICD-10-CM

## 2022-12-27 MED ORDER — LEVOCETIRIZINE DIHYDROCHLORIDE 5 MG PO TABS: 5.0000 mg | ORAL_TABLET | Freq: Every evening | ORAL | 0 refills | Status: DC

## 2023-01-06 ENCOUNTER — Other Ambulatory Visit: Payer: Self-pay | Admitting: Orthopedic Surgery

## 2023-01-11 ENCOUNTER — Encounter: Payer: Self-pay | Admitting: Family

## 2023-01-11 ENCOUNTER — Ambulatory Visit (INDEPENDENT_AMBULATORY_CARE_PROVIDER_SITE_OTHER): Payer: 59 | Admitting: Family

## 2023-01-11 VITALS — BP 138/82 | HR 76 | Temp 98.4°F | Resp 22 | Ht 65.0 in | Wt 225.0 lb

## 2023-01-11 DIAGNOSIS — J45909 Unspecified asthma, uncomplicated: Secondary | ICD-10-CM

## 2023-01-11 DIAGNOSIS — Z7689 Persons encountering health services in other specified circumstances: Secondary | ICD-10-CM

## 2023-01-11 DIAGNOSIS — I10 Essential (primary) hypertension: Secondary | ICD-10-CM | POA: Diagnosis not present

## 2023-01-11 DIAGNOSIS — Z131 Encounter for screening for diabetes mellitus: Secondary | ICD-10-CM | POA: Diagnosis not present

## 2023-01-11 DIAGNOSIS — E785 Hyperlipidemia, unspecified: Secondary | ICD-10-CM

## 2023-01-11 DIAGNOSIS — Z6837 Body mass index (BMI) 37.0-37.9, adult: Secondary | ICD-10-CM

## 2023-01-11 MED ORDER — PHENTERMINE HCL 37.5 MG PO CAPS: 37.5000 mg | ORAL_CAPSULE | ORAL | 0 refills | Status: DC

## 2023-01-11 MED ORDER — AMLODIPINE BESYLATE 5 MG PO TABS: 5.0000 mg | ORAL_TABLET | Freq: Every day | ORAL | 0 refills | Status: DC

## 2023-01-11 MED ORDER — ATORVASTATIN CALCIUM 20 MG PO TABS: 20.0000 mg | ORAL_TABLET | Freq: Every day | ORAL | 0 refills | Status: DC

## 2023-01-11 NOTE — Progress Notes (Signed)
Patient ID: Darlene Crosby, female    DOB: 1966/01/26  MRN: [id number]  CC: Chronic Conditions Follow-Up  Subjective: Darlene Crosby is a 57 y.o. female who presents for chronic conditions follow-up.   Her concerns today include:  - Doing well on Amlodipine, no issues/concerns. Home blood pressures at goal. She does not complain of red flag symptoms such as but not limited to chest pain, shortness of breath, worst headache of life, nausea/vomiting.  - Doing well on Rosuvastatin, no issues/concerns.  - Doing well on Phentermine, no issues/concerns. - Needs nebulizer machine.  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 04/12/2022   Status post lumbar laminectomy 09/20/2021   Body mass index (BMI) 37.0-37.9, adult 03/14/2019   Lumbar stenosis without neurogenic claudication 03/14/2019   Hyperlipidemia 02/08/2019   Lumbar spondylosis 10/25/2018   Degeneration of lumbar intervertebral disc 10/01/2018   Surgical wound dehiscence 06/06/2018   Osteoarthritis of left hip 04/25/2018   Spinal stenosis of lumbar region 02/23/2018   Pain in left knee 01/10/2018   Trigger thumb of right hand 03/30/2017   Symptomatic mammary hypertrophy 11/28/2014   Dyspnea 03/26/2013   Asthma vs VCD  02/28/2013   Hypertensive disorder 06/07/2012   Pyogenic granuloma 02/15/2012     Current Outpatient Medications on File Prior to Visit  Medication Sig Dispense Refill   acetaminophen (TYLENOL) 650 MG CR tablet Take 1,300 mg by mouth every 8 (eight) hours as needed for pain.     albuterol (VENTOLIN HFA) 108 (90 Base) MCG/ACT inhaler Inhale 2 puffs into the lungs every 4 (four) hours as needed (wheezing and SOB). 8 g 2   Ascorbic Acid (VITAMIN C PO) Take 1 tablet by mouth daily.     Calcium-Magnesium-Zinc (CAL-MAG-ZINC PO) Take 1 tablet by mouth daily.     hydrochlorothiazide (HYDRODIURIL) 25 MG tablet Take by mouth.      ipratropium-albuterol (DUONEB) 0.5-2.5 (3) MG/3ML SOLN Inhale 3 mLs into the lungs every 6 (six) hours as needed for shortness of breath.     levocetirizine (XYZAL) 5 MG tablet TAKE 1 TABLET BY MOUTH ONCE DAILY IN THE EVENING 30 tablet 0   methocarbamol (ROBAXIN) 500 MG tablet Take 1 tablet (500 mg total) by mouth every 6 (six) hours as needed (muscle spasms, pain). 50 tablet 2   pregabalin (LYRICA) 75 MG capsule Take 1 capsule (75 mg total) by mouth 2 (two) times daily. 60 capsule 2   Probiotic Product (PROBIOTIC PO) Take 1 capsule by mouth daily.     Turmeric 500 MG CAPS Take 500 mg by mouth daily.     vitamin B-12 (CYANOCOBALAMIN) 1000 MCG tablet Take 1,000 mcg by mouth daily.     benzonatate (TESSALON) 100 MG capsule Take 1 capsule (100 mg total) by mouth 3 (three) times daily as needed for cough. 30 capsule 1   chlorpheniramine-HYDROcodone (TUSSIONEX) 10-8 MG/5ML Take 5 mLs by mouth every 12 (twelve) hours as needed for cough. 70 mL 0   Cholecalciferol (VITAMIN D) 50 MCG (2000 UT) tablet Take 2,000 Units by mouth daily.     diclofenac (VOLTAREN) 50 MG EC tablet TAKE 1 TABLET BY MOUTH 3 TIMES  DAILY (Patient not taking: Reported on 01/11/2023) 300 tablet 2   docusate sodium (COLACE) 100 MG capsule Take 1 capsule (100 mg total) by mouth 2 (two) times daily. 40 capsule 0   fluticasone (FLONASE) 50 MCG/ACT nasal spray Place 1 spray into  both nostrils daily.     HYDROcodone-acetaminophen (NORCO) 7.5-325 MG tablet Take 1 tablet by mouth every 4 (four) hours as needed for moderate pain. 40 tablet 0   predniSONE (DELTASONE) 10 MG tablet Take 1 tablet (10 mg total) by mouth in the morning, at noon, and at bedtime. 15 tablet 0   Spacer/Aero-Holding Chambers DEVI 1 Units by Does not apply route in the morning, at noon, and at bedtime. 1 Units 0   No current facility-administered medications on file prior to visit.    Allergies  Allergen Reactions   Pineapple Shortness Of Breath, Swelling and Hives     Swelling of tongue    Chocolate Hives   Coconut (Cocos Nucifera) Hives and Rash   Strawberry Extract Hives   Wheat Hives   Chocolate Flavor Other (See Comments) and Hives   Pineapple Flavor Hives    Social History   Socioeconomic History   Marital status: Widowed    Spouse name: Not on file   Number of children: Not on file   Years of education: Not on file   Highest education level: Not on file  Occupational History   Not on file  Tobacco Use   Smoking status: Never    Passive exposure: Never   Smokeless tobacco: Never  Vaping Use   Vaping status: Never Used  Substance and Sexual Activity   Alcohol use: Yes    Alcohol/week: 1.0 standard drink of alcohol    Types: 1 Glasses of wine per week   Drug use: Never   Sexual activity: Not on file  Other Topics Concern   Not on file  Social History Narrative   Not on file   Social Determinants of Health   Financial Resource Strain: Not on file  Food Insecurity: Not on file  Transportation Needs: Not on file  Physical Activity: Not on file  Stress: Not on file  Social Connections: Not on file  Intimate Partner Violence: Not on file    Family History  Problem Relation Age of Onset   Hypertension Mother    Asthma Mother    Allergies Mother    Breast cancer Sister    Heart disease Sister    Hypertension Sister    Stroke Sister    Diabetes Sister    Colon cancer Neg Hx    Esophageal cancer Neg Hx    Stomach cancer Neg Hx    Pancreatic cancer Neg Hx     Past Surgical History:  Procedure Laterality Date   ANTERIOR HIP REVISION Left 06/06/2018   Procedure: LEFT HIP WOUND DEHISCENCE POSSIBLE HEAD AND LINER EXCHANGE;  Surgeon: Samson Frederic, MD;  Location: [facility];  Service: Orthopedics;  Laterality: Left;   COLONOSCOPY  2015   DIAGNOSTIC LAPAROSCOPY     tubal preg-took ovary and tube   LAPAROSCOPY FOR ECTOPIC PREGNANCY  1990s   LEFT SALPINGECTOMY AND RIGHT OOPHORECTOMY FOR ABNORMALITY   LUMBAR LAMINECTOMY N/A  09/20/2021   Procedure: L3-4 AND L4-5 CENTRAL LUMBAR LAMINECTOMIES;  Surgeon: Kerrin Champagne, MD;  Location: [facility];  Service: Orthopedics;  Laterality: N/A;   MASS EXCISION  02/15/2012   Procedure: EXCISION MASS;  Surgeon: Marlowe Shores, MD;  Location: [facility];  Service: Orthopedics;  Laterality: Right;  Excision of Right Small Volar Mass   MYELOGRAM     TONSILLECTOMY  age 56   TOTAL HIP ARTHROPLASTY Left 04/25/2018   Procedure: TOTAL HIP ARTHROPLASTY ANTERIOR APPROACH;  Surgeon: Samson Frederic, MD;  Location:  [facility];  Service: Orthopedics;  Laterality: Left;   TRIGGER FINGER RELEASE Right 2018   thumb   WISDOM TOOTH EXTRACTION  age 42    ROS: Review of Systems Negative except as stated above  PHYSICAL EXAM: BP 138/82   Pulse 76   Temp 98.4 F (36.9 C) (Oral)   Resp (!) 22   Ht 5\' 5"  (1.651 m)   Wt 225 lb (102.1 kg)   LMP 05/04/2012   SpO2 94%   BMI 37.44 kg/m   Physical Exam HENT:     Head: Normocephalic and atraumatic.     Nose: Nose normal.     Mouth/Throat:     Mouth: Mucous membranes are moist.     Pharynx: Oropharynx is clear.  Eyes:     Extraocular Movements: Extraocular movements intact.     Conjunctiva/sclera: Conjunctivae normal.     Pupils: Pupils are equal, round, and reactive to light.  Cardiovascular:     Rate and Rhythm: Normal rate and regular rhythm.     Pulses: Normal pulses.     Heart sounds: Normal heart sounds.  Pulmonary:     Effort: Pulmonary effort is normal.     Breath sounds: Normal breath sounds.  Musculoskeletal:        General: Normal range of motion.     Cervical back: Normal range of motion and neck supple.  Neurological:     General: No focal deficit present.     Mental Status: She is alert and oriented to person, place, and time.  Psychiatric:        Mood and Affect: Mood normal.        Behavior: Behavior normal.     ASSESSMENT AND PLAN: 1. Primary hypertension - Continue Amlodipine as prescribed.   - Routine screening.  - Counseled on blood pressure goal of less than 130/80, low-sodium, DASH diet, medication compliance, and 150 minutes of moderate intensity exercise per week as tolerated. Counseled on medication adherence and adverse effects. - Follow-up with primary provider in 3 months or sooner if needed.  - amLODipine (NORVASC) 5 MG tablet; Take 1 tablet (5 mg total) by mouth daily.  Dispense: 90 tablet; Refill: 0 - Basic Metabolic Panel  2. Hyperlipidemia, unspecified hyperlipidemia type - Continue Atorvastatin as prescribed. Counseled on medication adherence/adverse effects.  - Follow-up with primary provider in 3 months or sooner if needed.  - atorvastatin (LIPITOR) 20 MG tablet; Take 1 tablet (20 mg total) by mouth daily.  Dispense: 90 tablet; Refill: 0  3. Diabetes mellitus screening - Routine screening.  - Hemoglobin A1c  4. Chronic asthma, unspecified asthma severity, unspecified whether complicated, unspecified whether persistent - DME nebulizer machine as prescribed. - For home use only DME Nebulizer machine  5. Encounter for weight management 6. BMI 37.0-37.9, adult - Continue Phentermine as prescribed. Counseled on medication adherence/adverse effects.  - Follow-up with primary provider in 4 weeks or sooner if needed.   - phentermine 37.5 MG capsule; Take 1 capsule (37.5 mg total) by mouth every morning.  Dispense: 30 capsule; Refill: 0    Patient was given the opportunity to ask questions.  Patient verbalized understanding of the plan and was able to repeat key elements of the plan. Patient was given clear instructions to go to Emergency Department or return to medical center if symptoms don't improve, worsen, or new problems develop.The patient verbalized understanding.   Orders Placed This Encounter  Procedures   For home use only DME Nebulizer machine  Basic Metabolic Panel   Hemoglobin A1c     Requested Prescriptions   Signed Prescriptions Disp  Refills   phentermine 37.5 MG capsule 30 capsule 0    Sig: Take 1 capsule (37.5 mg total) by mouth every morning.   atorvastatin (LIPITOR) 20 MG tablet 90 tablet 0    Sig: Take 1 tablet (20 mg total) by mouth daily.   amLODipine (NORVASC) 5 MG tablet 90 tablet 0    Sig: Take 1 tablet (5 mg total) by mouth daily.    Return in about 3 months (around 04/13/2023) for Follow-Up or next available chronic conditions and 4 weeks weight check.  Rema Fendt, NP

## 2023-01-12 LAB — BASIC METABOLIC PANEL
BUN/Creatinine Ratio: 14 (ref 9–23)
BUN: 12 mg/dL (ref 6–24)
CO2: 24 mmol/L (ref 20–29)
Calcium: 9.2 mg/dL (ref 8.7–10.2)
Chloride: 104 mmol/L (ref 96–106)
Creatinine, Ser: 0.86 mg/dL (ref 0.57–1.00)
Glucose: 97 mg/dL (ref 70–99)
Potassium: 4.4 mmol/L (ref 3.5–5.2)
Sodium: 143 mmol/L (ref 134–144)
eGFR: 78 mL/min/{1.73_m2} (ref >59)

## 2023-01-12 LAB — HEMOGLOBIN A1C
Est. average glucose Bld gHb Est-mCnc: 128 mg/dL
Hgb A1c MFr Bld: 6.1 % — ABNORMAL HIGH (ref 4.8–5.6)

## 2023-01-16 ENCOUNTER — Telehealth: Payer: Self-pay | Admitting: Family

## 2023-01-16 NOTE — Telephone Encounter (Signed)
Call patient with update. Please confirm with patient which DME facility she prefers and update me. Thank you.

## 2023-01-16 NOTE — Telephone Encounter (Signed)
Patient called in stated provider needs to send the order for her nebulizer to a DME facility and not the pharmacy. Please f/u with patient

## 2023-01-17 ENCOUNTER — Telehealth: Payer: Self-pay | Admitting: Family

## 2023-01-17 NOTE — Telephone Encounter (Signed)
Copied from CRM [id number]. Topic: General - Other >> Jan 17, 2023 12:48 PM Clide Dales wrote: Patient called to let provider know that Darlene Crosby is in network with her insurance and they can supply the nebulizer to her. [phone]

## 2023-01-17 NOTE — Telephone Encounter (Signed)
I spoke to patient yesterday (01/16/2023) she stated she doesn't know either. I told her she can call the office and let us know which one she would like it to be sent to and we can send it out.

## 2023-01-18 ENCOUNTER — Other Ambulatory Visit (INDEPENDENT_AMBULATORY_CARE_PROVIDER_SITE_OTHER): Payer: 59

## 2023-01-18 ENCOUNTER — Ambulatory Visit (INDEPENDENT_AMBULATORY_CARE_PROVIDER_SITE_OTHER): Payer: 59 | Admitting: Orthopedic Surgery

## 2023-01-18 DIAGNOSIS — Z9889 Other specified postprocedural states: Secondary | ICD-10-CM

## 2023-01-18 DIAGNOSIS — M7062 Trochanteric bursitis, left hip: Secondary | ICD-10-CM | POA: Diagnosis not present

## 2023-01-18 NOTE — Progress Notes (Signed)
Orthopedic Spine Surgery Office Note   Assessment: Patient is a 57 y.o. female with chronic stable low back pain. Leg pain has resolved after her laminectomy surgery with Dr. Otelia Sergeant. Is now having left-sided trochanteric bursitis     Plan: -Operative plans complete -Out of bed as tolerated, no brace -Since she has noted relief with Robaxin and Lyrica told her to continue to take those medications and I can refill them as needed. She said she does not currently need any refills -She should continue to use voltaren gel to desensitize the area on her low back -For her left trochanteric bursitis, patient wanted to try a injection which was done today in the office.  I also referred her to PT for this problem -Patient should return to office in 6 months, x-rays at next visit: AP/lateral/flex/ex lumbar    Left trochanteric bursa injection note: After discussing the risk, benefits, alternatives of left trochanteric bursa injection, patient elected to proceed.  Patient was in the lateral decubitus position with the left hip up.  The skin over the trochanteric bursa was prepped with an alcohol based prep.  Ethyl chloride was used to anesthetize the skin.  A 20-gauge needle was used to inject 1 cc of bupivacaine, 1 cc of lidocaine, 1 cc of Depo-Medrol under standard sterile technique.  Needle was withdrawn and Band-Aid was applied.  Patient tolerated procedure well.   ___________________________________________________________________________     History:   Patient is a 57 y.o. female who presents today for routine follow-up on her lumbar spine.  She is now over 1 year out from L3-5 laminectomies with Dr. Otelia Sergeant.  Her surgery was on 09/16/2021.  She has noted significant relief in her leg pain since the surgery.  She is still having back pain though.  She feels it in her lower lumbar region.  She states that she feels it with bending over or rotation of the spine.  She has found the Robaxin and Lyrica to be  helpful.  She said she is able to get a full night of sleep at night with those medications.  More recently, she has developed pain over the lateral aspect of the left hip.  She has pain when she lays on that side at night and can no longer lay on her left side as a result of that pain.  She does not have any pain radiating down the left leg further than the lateral hip.  She has no pain rating into the right lower extremity.  There is no trauma or injury that preceded the onset of this left lateral hip pain.     Physical Exam:   General: no acute distress, appears stated age Neurologic: alert, answering questions appropriately, following commands Respiratory: unlabored breathing on room air, symmetric chest rise Psychiatric: appropriate affect, normal cadence to speech    MSK (spine):   -Strength exam                                                   Left                  Right EHL                              5/5  5/5 TA                                 5/5                  5/5 GSC                             5/5                  5/5 Knee extension            5/5                  5/5 Hip flexion                    5/5                  4+/5   -Sensory exam                           Sensation intact to light touch in L3-S1 nerve distributions of bilateral lower extremities  Left hip exam: TTP over the greater trochanter, no other tenderness palpation over the remainder of the hip, no pain through range of motion, negative Stinchfield   Imaging: XRs of the lumbar spine from 01/18/2023 were independently reviewed and interpreted, showing laminectomy defect from L3-5. No evidence of instability on flexion/extension views. Disc height loss with anterior osteophyte formation at multiple levels. No fracture or dislocation seen.      Patient name: Darlene Crosby Patient MRN: [id number] Date of visit: 01/18/23

## 2023-02-01 NOTE — Therapy (Signed)
OUTPATIENT PHYSICAL THERAPY LOWER EXTREMITY EVALUATION   Patient Name: Darlene Crosby MRN: [id number] DOB:05/21/1965, 57 y.o., female Today's Date: 02/02/2023  END OF SESSION:  PT End of Session - 02/02/23 0917     Visit Number 1    Number of Visits 17    Date for PT Re-Evaluation 03/30/23    Authorization Type [employer]/M[employer]    Authorization Time Period no auth required per eval appt notes    Progress Note Due on Visit 10    PT Start Time 0920    PT Stop Time 1001    PT Time Calculation (min) 41 min    Activity Tolerance Patient tolerated treatment well;No increased pain    Behavior During Therapy WFL for tasks assessed/performed             Past Medical History:  Diagnosis Date   Asthma    followed by pcp   Bronchitis    "chronic"   Hypertension    OA (osteoarthritis)    left knee, right shoulder, left hip   Pneumonia    "a couple years ago" per pt on 09/15/21   Seasonal allergies    Wears glasses    Past Surgical History:  Procedure Laterality Date   ANTERIOR HIP REVISION Left 06/06/2018   Procedure: LEFT HIP WOUND DEHISCENCE POSSIBLE HEAD AND LINER EXCHANGE;  Surgeon: Samson Frederic, MD;  Location: [facility];  Service: Orthopedics;  Laterality: Left;   COLONOSCOPY  2015   DIAGNOSTIC LAPAROSCOPY     tubal preg-took ovary and tube   LAPAROSCOPY FOR ECTOPIC PREGNANCY  1990s   LEFT SALPINGECTOMY AND RIGHT OOPHORECTOMY FOR ABNORMALITY   LUMBAR LAMINECTOMY N/A 09/20/2021   Procedure: L3-4 AND L4-5 CENTRAL LUMBAR LAMINECTOMIES;  Surgeon: Kerrin Champagne, MD;  Location: [facility];  Service: Orthopedics;  Laterality: N/A;   MASS EXCISION  02/15/2012   Procedure: EXCISION MASS;  Surgeon: Marlowe Shores, MD;  Location: [facility];  Service: Orthopedics;  Laterality: Right;  Excision of Right Small Volar Mass   MYELOGRAM     TONSILLECTOMY  age 68   TOTAL HIP ARTHROPLASTY Left 04/25/2018   Procedure: TOTAL HIP ARTHROPLASTY ANTERIOR APPROACH;  Surgeon: Samson Frederic, MD;  Location: [facility];  Service: Orthopedics;  Laterality: Left;   TRIGGER FINGER RELEASE Right 2018   thumb   WISDOM TOOTH EXTRACTION  age 58   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 04/12/2022   Status post lumbar laminectomy 09/20/2021   Body mass index (BMI) 37.0-37.9, adult 03/14/2019   Lumbar stenosis without neurogenic claudication 03/14/2019   Hyperlipidemia 02/08/2019   Lumbar spondylosis 10/25/2018   Degeneration of lumbar intervertebral disc 10/01/2018   Surgical wound dehiscence 06/06/2018   Osteoarthritis of left hip 04/25/2018   Spinal stenosis of lumbar region 02/23/2018   Pain in left knee 01/10/2018   Trigger thumb of right hand 03/30/2017   Symptomatic mammary hypertrophy 11/28/2014   Dyspnea 03/26/2013   Asthma vs VCD  02/28/2013   Hypertensive disorder 06/07/2012   Pyogenic granuloma 02/15/2012    PCP: Rema Fendt, NP  REFERRING PROVIDER: London Sheer, MD  REFERRING DIAG: M70.62 (ICD-10-CM) - Trochanteric bursitis, left hip  THERAPY DIAG:  Pain in left hip  Muscle weakness (generalized)  Difficulty in walking, not elsewhere classified  Rationale for Evaluation and Treatment: Rehabilitation  ONSET DATE: acute on chronic, about a month ago  SUBJECTIVE:  SUBJECTIVE STATEMENT: Pt endorses longstanding history of back and hip pain with surgeries (2023 and 2020 respectively). Reports worsening of hip pain over past month without change in activity or injury. States it does feel similar to pain from around time of surgery. Feels her hip is weak during car transfers and has reduced tolerance to standing/walking. Uses AD as needed. Denies N/T, no fevers/chills, no bowel/bladder issues.   PERTINENT HISTORY: asthma, HTN, prior lumbar laminectomy July 2023 PAIN:  Are you having pain: 5/10 Location/description: sharp and throbbing, L hip, anterior and  lateral Best-worst over past week: 5-8/10 (takes a while to settle)  - aggravating factors: stairs, standing/walking (~10-20min, more limited by back), community navigation, sleeping on side, car transfer, laundry and other housework - Easing factors: icing, use of AD, recliner   PRECAUTIONS: None  WEIGHT BEARING RESTRICTIONS: No  FALLS:  Has patient fallen in last 6 months? No  LIVING ENVIRONMENT: Lives w/ daughter and 2 grandchildren 1 story home, 1STE Pt does housework, does require rest breaks due to back Has a walker she uses in mornings, uses cane when she is in the gym and in community   OCCUPATION: not working - enjoys walking in gym twice a week  PLOF: Independent  PATIENT GOALS: wants to try to exercise two days a week, wants to try to get stronger   NEXT MD VISIT: January 2025  OBJECTIVE:  Note: Objective measures were completed at Evaluation unless otherwise noted.  DIAGNOSTIC FINDINGS:  01/18/23 Lumbar XR, per results narrative: "XRs of the lumbar spine from 01/18/2023 were independently reviewed and  interpreted, showing laminectomy defect from L3-5. No evidence of  instability on flexion/extension views. Disc height loss with anterior  osteophyte formation at multiple levels. No fracture or dislocation seen. "  PATIENT SURVEYS:  FOTO 52 > 66  COGNITION: Overall cognitive status: Within functional limits for tasks assessed     SENSATION: LT intact, no sensory complaints   LOWER EXTREMITY ROM:     Active  Right eval Left eval  Hip flexion    Hip extension    Hip internal rotation    Hip external rotation    Knee extension    Knee flexion    (Blank rows = not tested) (Key: WFL = within functional limits not formally assessed, * = concordant pain, s = stiffness/stretching sensation, NT = not tested)  Comments: not formally measured but pt is able to achieve ~45 deg of active L hip flexion in standing position  LOWER EXTREMITY MMT:    MMT  Right eval Left eval  Hip flexion 4- 3- *  Hip abduction (modified sitting) 4+ 4+  Hip internal rotation 4+ 3- *  Hip external rotation 4+ 3- *  Knee flexion 5 4  Knee extension 5 5  Ankle dorsiflexion     (Blank rows = not tested) (Key: WFL = within functional limits not formally assessed, * = concordant pain, s = stiffness/stretching sensation, NT = not tested)  Comments:     FUNCTIONAL TESTS:  5xSTS: 13.49sec UE support standard chair   GAIT: Distance walked: within clinic Assistive device utilized: None Level of assistance: Complete Independence Comments: reduced gait speed/cadence, reduced stance time L hip, reduced clearance L LE   TODAY'S TREATMENT:  [facility] Adult PT Treatment:                                                DATE: 02/02/23 Therapeutic Exercise: STS x5 with UE support cues for pacing and control Standing march x5 each LE w/in comfortable ROM HEP handout + education, education on rationale for interventions, monitoring symptoms and appropriate modifications    PATIENT EDUCATION:  Education details: Pt education on PT impairments, prognosis, and POC. Informed consent. Rationale for interventions, safe/appropriate HEP performance Person educated: Patient Education method: Explanation, Demonstration, Tactile cues, Verbal cues, and Handouts Education comprehension: verbalized understanding, returned demonstration, verbal cues required, tactile cues required, and needs further education    HOME EXERCISE PROGRAM: Access Code: [email or web address] URL: [email or web address] Date: 02/02/2023 Prepared by: Fransisco Hertz  Exercises - Sit to Stand with Armchair  - 2-3 x daily - 1 sets - 5 reps - Standing March with Counter Support  - 2-3 x daily - 1 sets - 5 reps  ASSESSMENT:  CLINICAL IMPRESSION: Patient is a pleasant 57 y.o. woman  who was seen today for physical therapy evaluation and treatment for L hip pain ongoing for several years but worsening over past month or so. She endorses weakness in hip and reduced tolerance to standing/walking. On exam she demonstrates reduced activity mobility of L hip and weakness of L hip flexors, rotational musculature, and hamstring. 5xSTS around cutoff time for fall risk but does require UE support. Tolerates HEP and exam well without adverse event or increase in resting pain, increased time w/ education on appropriate performance at home and rationale for interventions. Recommend trial of skilled PT to address aforementioned deficits with aim of improving functional tolerance and reducing pain with typical activities. Pt departs today's session in no acute distress, all voiced concerns/questions addressed appropriately from PT perspective.    OBJECTIVE IMPAIRMENTS: Abnormal gait, decreased activity tolerance, decreased endurance, decreased mobility, difficulty walking, decreased ROM, decreased strength, impaired perceived functional ability, improper body mechanics, postural dysfunction, and pain.   ACTIVITY LIMITATIONS: carrying, lifting, bending, sitting, standing, squatting, sleeping, stairs, transfers, and locomotion level  PARTICIPATION LIMITATIONS: meal prep, cleaning, laundry, shopping, and community activity  PERSONAL FACTORS: Age, Time since onset of injury/illness/exacerbation, and 3+ comorbidities: asthma, HTN, prior surgeries  are also affecting patient's functional outcome.   REHAB POTENTIAL: Good  CLINICAL DECISION MAKING: Stable/uncomplicated  EVALUATION COMPLEXITY: Low   GOALS: Goals reviewed with patient? Yes  SHORT TERM GOALS: Target date: 03/02/2023 Pt will demonstrate appropriate understanding and performance of initially prescribed HEP in order to facilitate improved independence with management of symptoms.  Baseline: HEP provided on eval Goal status: INITIAL    2. Pt will score greater than or equal to 59 on FOTO in order to demonstrate improved perception of function due to symptoms.  Baseline: 52  Goal status: INITIAL    LONG TERM GOALS: Target date: 03/30/2023 Pt will score 66 or greater on FOTO in order to demonstrate improved perception of function due to symptoms.  Baseline: 52 Goal status: INITIAL  2.  Pt will demonstrate at least 4/5 L hip flexion MMT in order to facilitate improved LE strength for transfers/gait. Baseline: see MMT chart above Goal status: INITIAL  3.  Pt will report at least 50% decrease in overall pain levels in past week in order to facilitate improved tolerance to basic ADLs/mobility.  Baseline: 5-8/10  Goal status: INITIAL    4.  Pt will be able to perform 5xSTS in less than or equal to 10sec in order to demonstrate reduced fall risk and improved functional independence (MCID 5xSTS = 2.3 sec). Baseline: 13sec w/ UE support Goal status: INITIAL   5. Pt will report ability to perform typical household tasks such as cleaning/laundry with less than 3 pt increase in L hip pain on NPS in order to facilitate improved independence w/ daily tasks.  Baseline: increased pain w/ household tasks  Goal status: INITIAL  6. Pt will demonstrate appropriate performance of final prescribed HEP in order to facilitate improved self-management of symptoms post-discharge.   Baseline: initial HEP prescribed  Goal status: INITIAL     PLAN:  PT FREQUENCY: 2x/week  PT DURATION: 8 weeks  PLANNED INTERVENTIONS: 97164- PT Re-evaluation, 97110-Therapeutic exercises, 97530- Therapeutic activity, [id number]- Neuromuscular re-education, 97535- Self Care, [id number]- Manual therapy, [phone]- Gait training, [phone]- Aquatic Therapy, Patient/Family education, Balance training, Stair training, Taping, Dry Needling, Joint mobilization, Spinal mobilization, Cryotherapy, and Moist heat  PLAN FOR NEXT SESSION: Review/update HEP PRN. Work on [employer]  exercises as appropriate with emphasis on L hip strength and functional mechanics. Symptom modification strategies as indicated/appropriate.    Ashley Murrain PT, DPT 02/02/2023 10:13 AM

## 2023-02-02 ENCOUNTER — Encounter: Payer: Self-pay | Admitting: Physical Therapy

## 2023-02-02 ENCOUNTER — Ambulatory Visit: Payer: 59 | Attending: Orthopedic Surgery | Admitting: Physical Therapy

## 2023-02-02 DIAGNOSIS — M25552 Pain in left hip: Secondary | ICD-10-CM | POA: Diagnosis present

## 2023-02-02 DIAGNOSIS — M6281 Muscle weakness (generalized): Secondary | ICD-10-CM | POA: Insufficient documentation

## 2023-02-02 DIAGNOSIS — R262 Difficulty in walking, not elsewhere classified: Secondary | ICD-10-CM | POA: Diagnosis present

## 2023-02-02 DIAGNOSIS — M7062 Trochanteric bursitis, left hip: Secondary | ICD-10-CM | POA: Diagnosis not present

## 2023-02-06 IMAGING — MR MR LUMBAR SPINE W/O CM
4 of 5 series · 26 of 48 positions shown · non-contrast
Comparison: 02/18/2019

CLINICAL DATA: Low back pain, spondyloarthropathy suspected, X-ray
done neurogenic claudication symptoms. Spinal stenosis of lumbar
region with neurogenic claudication. Lumbar spondylosis. Congenital
spinal stenosis of lumbar region. Degeneration of lumbar
intervertebral disc. Weakness of left hip.

EXAM:
MRI LUMBAR SPINE WITHOUT CONTRAST
TECHNIQUE: Multiplanar, multisequence MR imaging of the lumbar spine was
performed. No intravenous contrast was administered.

[Series 5: T2 · sagittal · 4.0mm · 0.73mm/px · 6 of 15 slices shown (1 of 2)]
[im 1/15]
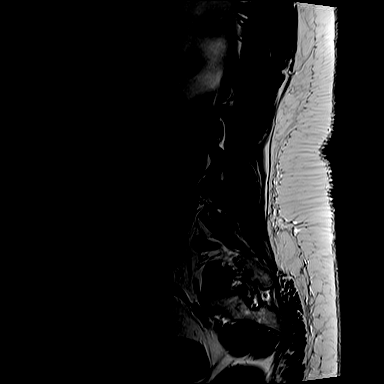
[im 3/15]
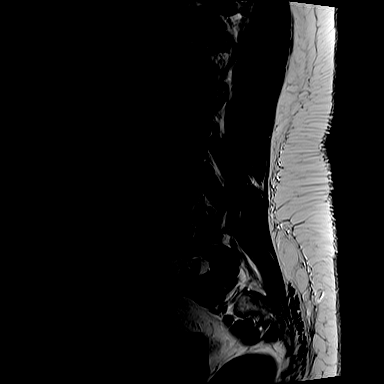
[im 6/15]
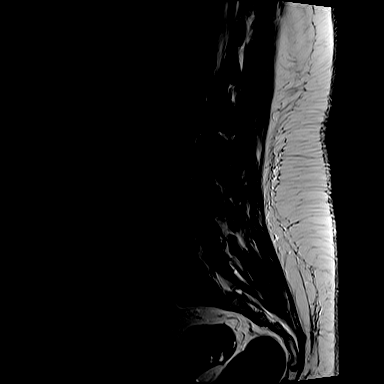
[im 9/15]
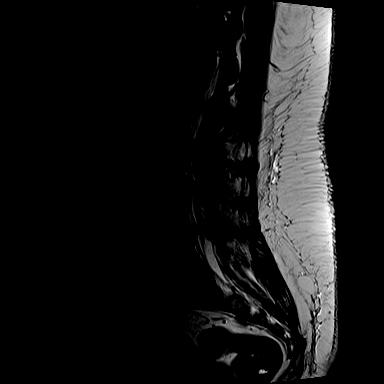
[im 12/15]
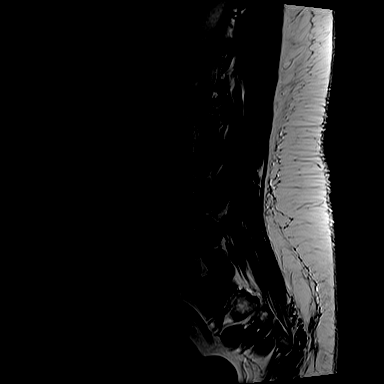
[im 15/15]
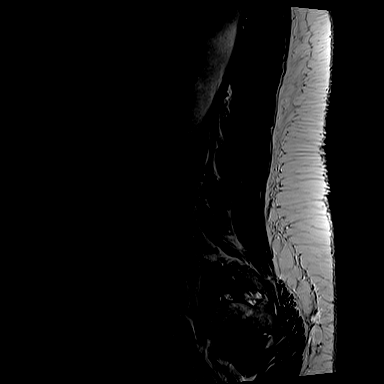

[Series 7: T1 · sagittal · 4.0mm · 0.88mm/px · 7 of 15 slices shown (1 of 2)]
[im 1/15]
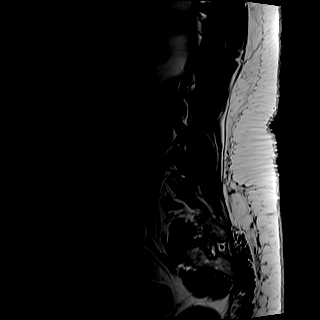
[im 3/15]
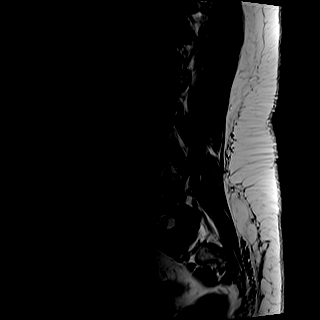
[im 5/15]
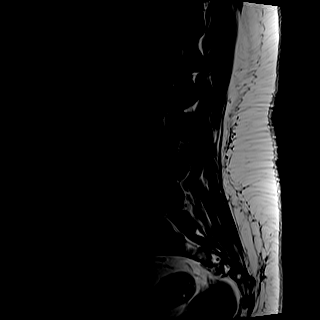
[im 8/15]
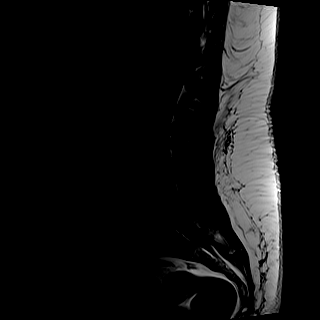
[im 10/15]
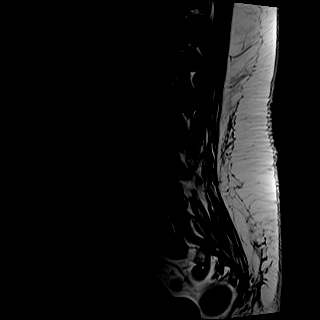
[im 12/15]
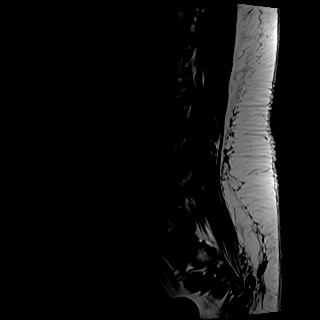
[im 15/15]
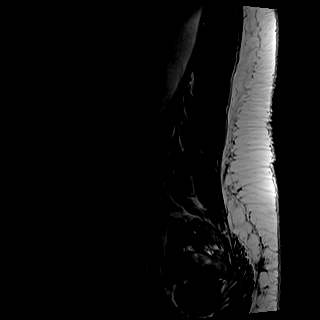

[Series 8: T2 · axial · 4.0mm · 0.57mm/px · z∈[-107,+93]mm · 8 of 32 slices shown (2 of 2)]
[im 1/32]
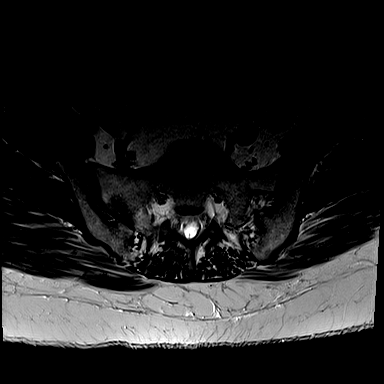
[im 5/32]
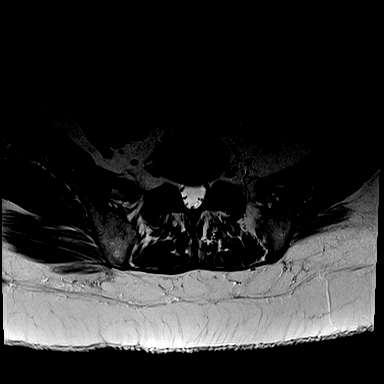
[im 10/32]
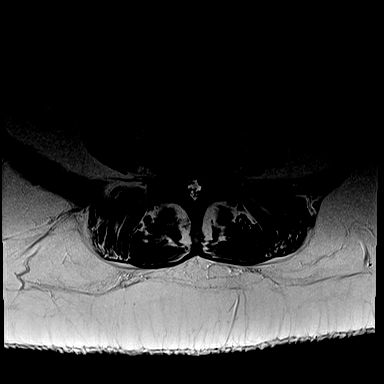
[im 15/32]
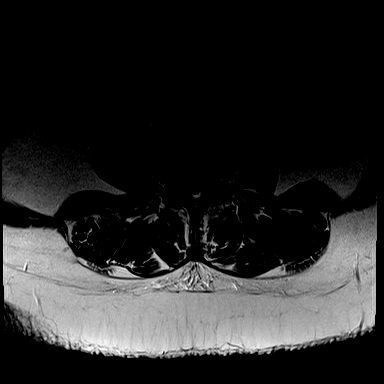
[im 17/32]
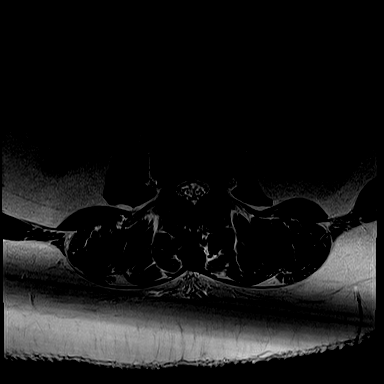
[im 22/32]
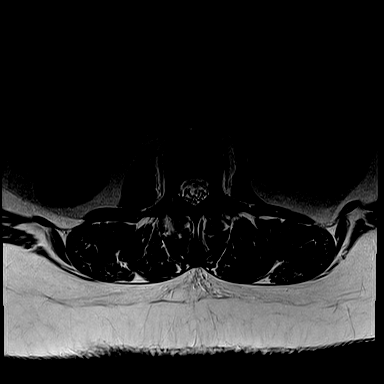
[im 27/32]
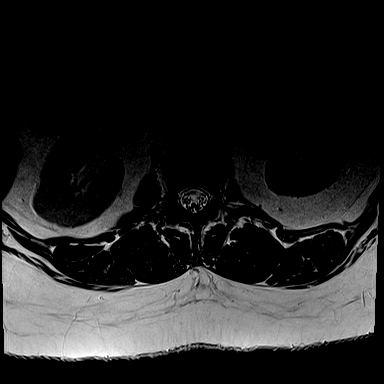
[im 32/32]
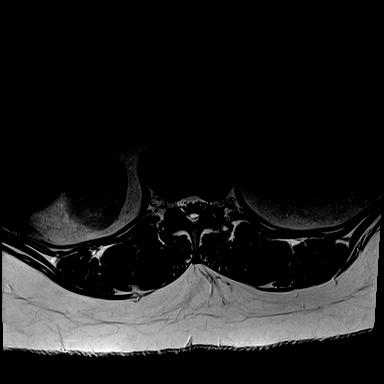

[Series 9: T1 · axial · 4.0mm · 0.34mm/px · z∈[-107,+68]mm · 5 of 32 slices shown (2 of 2)]
[im 1/32]
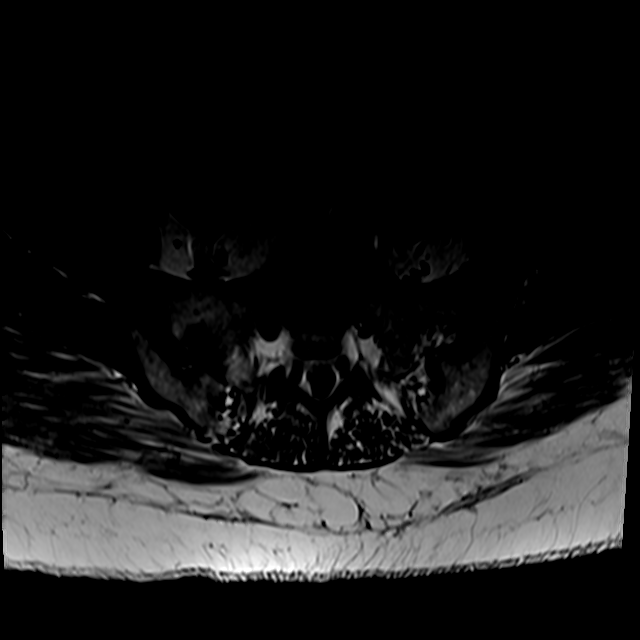
[im 5/32]
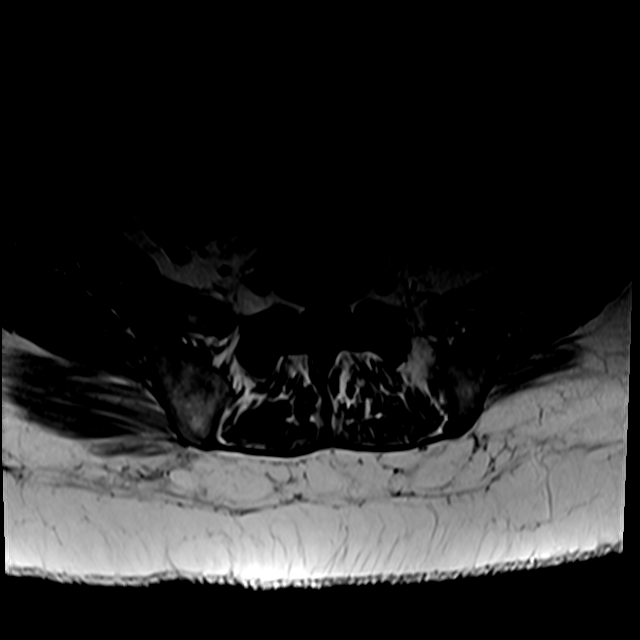
[im 10/32]
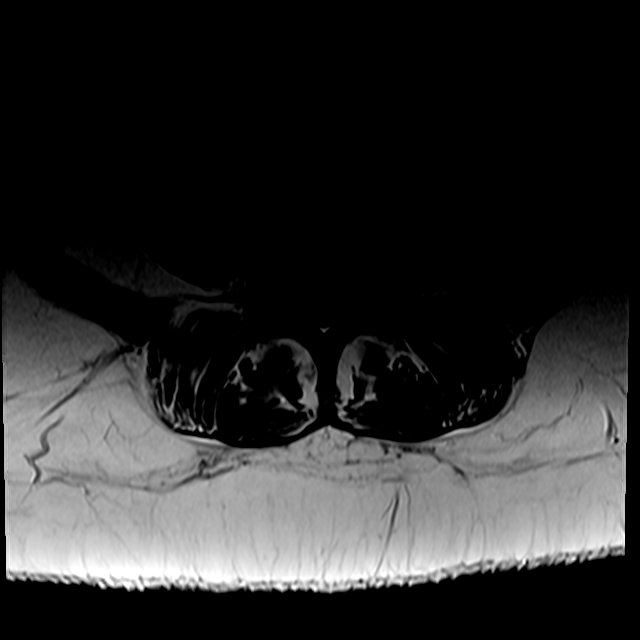
[im 17/32]
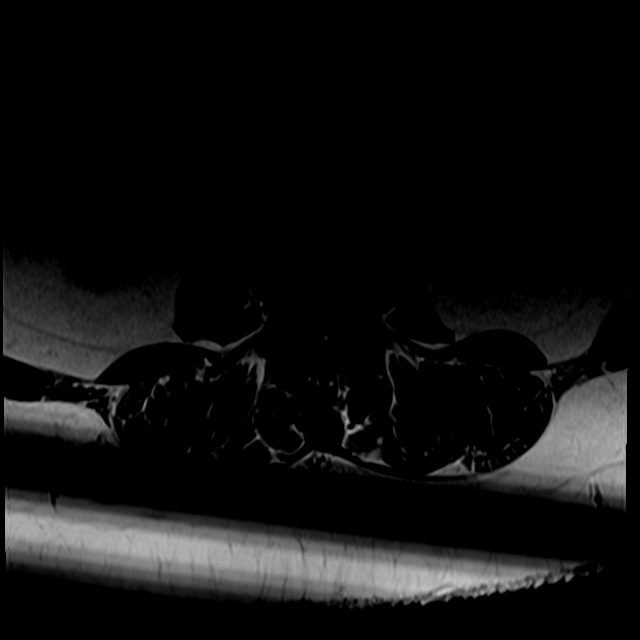
[im 27/32]
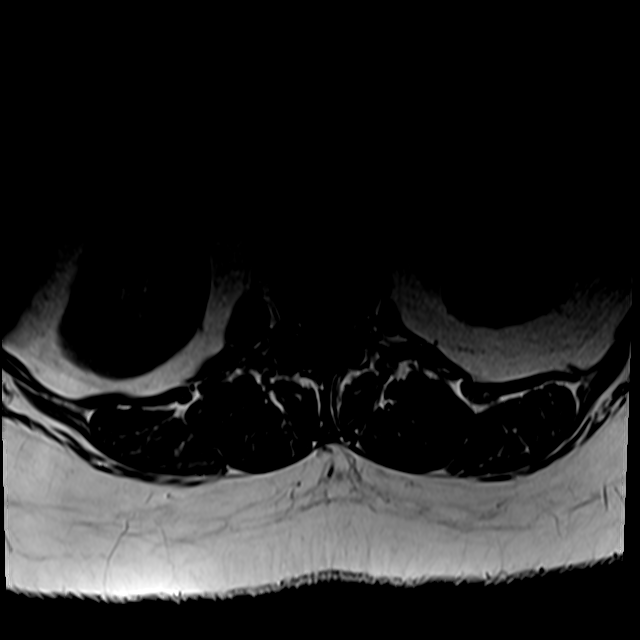

[26 of 48 positions shown; findings below may reference images not displayed]

FINDINGS: Segmentation:  Standard.

Alignment:  Physiologic.

Vertebrae:  No fracture, evidence of discitis, or bone lesion.

Conus medullaris and cauda equina: Conus extends to the L1 level.
Conus and cauda equina appear normal.

Paraspinal and other soft tissues: Negative

Disc levels:

T12-L1: Small disc bulge without stenosis.

L1-L2: Unchanged medium-sized disc bulge with mild facet
hypertrophy. No spinal canal stenosis. Mild right neural foraminal
stenosis.

L2-L3: Moderate facet hypertrophy and small disc bulge, unchanged.
Mild spinal canal stenosis. No neural foraminal stenosis.

L3-L4: Slightly worsened medium-sized disc bulge. Moderate spinal
canal stenosis. Progression of moderate bilateral neural foraminal
stenosis.

L4-L5: Unchanged small disc bulge. Narrowing of both lateral
recesses, right greater than left. No central spinal canal stenosis.
Unchanged mild bilateral neural foraminal stenosis.

L5-S1: Moderate facet hypertrophy. Normal disc. No spinal canal
stenosis. No neural foraminal stenosis.

Visualized sacrum: Normal.
IMPRESSION: 1. Slight worsening of moderate spinal canal stenosis and moderate
bilateral neural foraminal stenosis at L3-L4.
2. Unchanged mild spinal canal stenosis at L2-L3.
3. Unchanged lateral recess stenosis at L4-L5, right greater than
left. Correlate for right L5 radiculopathy.
4. Unchanged mild bilateral L4-5 and right L1-2 neural foraminal
stenosis.

## 2023-02-08 ENCOUNTER — Ambulatory Visit (INDEPENDENT_AMBULATORY_CARE_PROVIDER_SITE_OTHER): Payer: 59 | Admitting: Family

## 2023-02-08 ENCOUNTER — Encounter: Payer: Self-pay | Admitting: Family

## 2023-02-08 VITALS — BP 140/92 | HR 102 | Temp 98.1°F | Resp 16 | Wt 232.8 lb

## 2023-02-08 DIAGNOSIS — R0989 Other specified symptoms and signs involving the circulatory and respiratory systems: Secondary | ICD-10-CM

## 2023-02-08 DIAGNOSIS — E66812 Obesity, class 2: Secondary | ICD-10-CM | POA: Diagnosis not present

## 2023-02-08 DIAGNOSIS — E669 Obesity, unspecified: Secondary | ICD-10-CM

## 2023-02-08 DIAGNOSIS — Z7689 Persons encountering health services in other specified circumstances: Secondary | ICD-10-CM

## 2023-02-08 DIAGNOSIS — Z6838 Body mass index (BMI) 38.0-38.9, adult: Secondary | ICD-10-CM | POA: Diagnosis not present

## 2023-02-08 MED ORDER — PSEUDOEPH-BROMPHEN-DM 30-2-10 MG/5ML PO SYRP: 5.0000 mL | ORAL_SOLUTION | Freq: Four times a day (QID) | ORAL | 0 refills | Status: DC | PRN

## 2023-02-08 MED ORDER — PREDNISONE 10 MG PO TABS: ORAL_TABLET | ORAL | 0 refills | Status: AC

## 2023-02-08 MED ORDER — PHENTERMINE HCL 37.5 MG PO CAPS: 37.5000 mg | ORAL_CAPSULE | ORAL | 0 refills | Status: DC

## 2023-02-08 NOTE — Progress Notes (Signed)
Patient c/o having possible URI. Patient said that she has been coughing and feeling bad for several days.  Patient came in for monthly weight check. Patient has no other concerns today

## 2023-02-08 NOTE — Progress Notes (Signed)
Patient ID: Darlene Crosby, female    DOB: [birth year]  MRN: [id number]  CC: Weight Check  Subjective: Darlene Crosby is a 57 y.o. female who presents for weight check.   Her concerns today include:  - Doing well on Phentermine, no issues/concerns.  - States she has not taken Amlodipine for today as of present. Reports home blood pressures 130's/80's. She does not complain of red flag symptoms such as but not limited to chest pain, shortness of breath, worst headache of life, nausea/vomiting.  - Reports recent telemedicine visit for upper respiratory symptoms. States provider told her unable to prescribe cough syrup and steroid because of being telemedicine visit and to follow-up with primary provider. Tessalon Perles did not help.Today patient reports nonproductive cough, runny nose, and runny eyes. Denies red flag symptoms. States thinks symptoms are related to bronchitis flare. Reports she is using inhaler and nebulizer to help. She declines respiratory panel.  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 04/12/2022   Status post lumbar laminectomy 09/20/2021   Body mass index (BMI) 37.0-37.9, adult 03/14/2019   Lumbar stenosis without neurogenic claudication 03/14/2019   Hyperlipidemia 02/08/2019   Lumbar spondylosis 10/25/2018   Degeneration of lumbar intervertebral disc 10/01/2018   Surgical wound dehiscence 06/06/2018   Osteoarthritis of left hip 04/25/2018   Spinal stenosis of lumbar region 02/23/2018   Pain in left knee 01/10/2018   Trigger thumb of right hand 03/30/2017   Symptomatic mammary hypertrophy 11/28/2014   Dyspnea 03/26/2013   Asthma vs VCD  02/28/2013   Hypertensive disorder 06/07/2012   Pyogenic granuloma 02/15/2012     Current Outpatient Medications on File Prior to Visit  Medication Sig Dispense Refill   acetaminophen (TYLENOL) 650 MG CR tablet Take 1,300 mg by mouth every 8 (eight)  hours as needed for pain.     albuterol (VENTOLIN HFA) 108 (90 Base) MCG/ACT inhaler Inhale 2 puffs into the lungs every 4 (four) hours as needed (wheezing and SOB). 8 g 2   amLODipine (NORVASC) 5 MG tablet Take 1 tablet (5 mg total) by mouth daily. 90 tablet 0   Ascorbic Acid (VITAMIN C PO) Take 1 tablet by mouth daily.     atorvastatin (LIPITOR) 20 MG tablet Take 1 tablet (20 mg total) by mouth daily. 90 tablet 0   benzonatate (TESSALON) 100 MG capsule Take 1 capsule (100 mg total) by mouth 3 (three) times daily as needed for cough. 30 capsule 1   Calcium-Magnesium-Zinc (CAL-MAG-ZINC PO) Take 1 tablet by mouth daily.     chlorpheniramine-HYDROcodone (TUSSIONEX) 10-8 MG/5ML Take 5 mLs by mouth every 12 (twelve) hours as needed for cough. 70 mL 0   Cholecalciferol (VITAMIN D) 50 MCG (2000 UT) tablet Take 2,000 Units by mouth daily.     diclofenac (VOLTAREN) 50 MG EC tablet TAKE 1 TABLET BY MOUTH 3 TIMES  DAILY (Patient not taking: Reported on 01/11/2023) 300 tablet 2   docusate sodium (COLACE) 100 MG capsule Take 1 capsule (100 mg total) by mouth 2 (two) times daily. 40 capsule 0   fluticasone (FLONASE) 50 MCG/ACT nasal spray Place 1 spray into both nostrils daily.     hydrochlorothiazide (HYDRODIURIL) 25 MG tablet Take by mouth.     HYDROcodone-acetaminophen (NORCO) 7.5-325 MG tablet Take 1 tablet by mouth every 4 (four) hours as needed for moderate pain. 40 tablet 0   ipratropium-albuterol (DUONEB) 0.5-2.5 (3) MG/3ML SOLN Inhale 3  mLs into the lungs every 6 (six) hours as needed for shortness of breath.     levocetirizine (XYZAL) 5 MG tablet TAKE 1 TABLET BY MOUTH ONCE DAILY IN THE EVENING 30 tablet 0   predniSONE (DELTASONE) 10 MG tablet Take 1 tablet (10 mg total) by mouth in the morning, at noon, and at bedtime. 15 tablet 0   pregabalin (LYRICA) 75 MG capsule Take 1 capsule (75 mg total) by mouth 2 (two) times daily. 60 capsule 2   Probiotic Product (PROBIOTIC PO) Take 1 capsule by mouth  daily.     Spacer/Aero-Holding Chambers DEVI 1 Units by Does not apply route in the morning, at noon, and at bedtime. 1 Units 0   Turmeric 500 MG CAPS Take 500 mg by mouth daily.     vitamin B-12 (CYANOCOBALAMIN) 1000 MCG tablet Take 1,000 mcg by mouth daily.     No current facility-administered medications on file prior to visit.    Allergies  Allergen Reactions   Pineapple Shortness Of Breath, Swelling and Hives    Swelling of tongue    Chocolate Hives   Coconut (Cocos Nucifera) Hives and Rash   Strawberry Extract Hives   Wheat Hives   Chocolate Flavoring Agent (Non-Screening) Other (See Comments) and Hives   Flavoring Agent Hives   Pineapple Flavoring Agent (Non-Screening) Hives    Social History   Socioeconomic History   Marital status: Widowed    Spouse name: Not on file   Number of children: Not on file   Years of education: Not on file   Highest education level: Not on file  Occupational History   Not on file  Tobacco Use   Smoking status: Never    Passive exposure: Never   Smokeless tobacco: Never  Vaping Use   Vaping status: Never Used  Substance and Sexual Activity   Alcohol use: Yes    Alcohol/week: 1.0 standard drink of alcohol    Types: 1 Glasses of wine per week   Drug use: Never   Sexual activity: Not on file  Other Topics Concern   Not on file  Social History Narrative   Not on file   Social Determinants of Health   Financial Resource Strain: Low Risk  (02/08/2023)   Overall Financial Resource Strain (CARDIA)    Difficulty of Paying Living Expenses: Not hard at all  Food Insecurity: No Food Insecurity (02/08/2023)   Hunger Vital Sign    Worried About Running Out of Food in the Last Year: Never true    Ran Out of Food in the Last Year: Never true  Transportation Needs: No Transportation Needs (02/08/2023)   PRAPARE - Administrator, Civil Service (Medical): No    Lack of Transportation (Non-Medical): No  Physical Activity:  Insufficiently Active (02/08/2023)   Exercise Vital Sign    Days of Exercise per Week: 3 days    Minutes of Exercise per Session: 30 min  Stress: No Stress Concern Present (02/08/2023)   [employer] of Occupational Health - Occupational Stress Questionnaire    Feeling of Stress : Not at all  Social Connections: Moderately Integrated (02/08/2023)   Social Connection and Isolation Panel [NHANES]    Frequency of Communication with Friends and Family: Three times a week    Frequency of Social Gatherings with Friends and Family: Three times a week    Attends Religious Services: More than 4 times per year    Active Member of Clubs or Organizations: No  Attends Banker Meetings: 1 to 4 times per year    Marital Status: Separated  Intimate Partner Violence: Not At Risk (02/08/2023)   Humiliation, Afraid, Rape, and Kick questionnaire    Fear of Current or Ex-Partner: No    Emotionally Abused: No    Physically Abused: No    Sexually Abused: No    Family History  Problem Relation Age of Onset   Hypertension Mother    Asthma Mother    Allergies Mother    Breast cancer Sister    Heart disease Sister    Hypertension Sister    Stroke Sister    Diabetes Sister    Colon cancer Neg Hx    Esophageal cancer Neg Hx    Stomach cancer Neg Hx    Pancreatic cancer Neg Hx     Past Surgical History:  Procedure Laterality Date   ANTERIOR HIP REVISION Left 06/06/2018   Procedure: LEFT HIP WOUND DEHISCENCE POSSIBLE HEAD AND LINER EXCHANGE;  Surgeon: Samson Frederic, MD;  Location: [facility];  Service: Orthopedics;  Laterality: Left;   COLONOSCOPY  2015   DIAGNOSTIC LAPAROSCOPY     tubal preg-took ovary and tube   LAPAROSCOPY FOR ECTOPIC PREGNANCY  1990s   LEFT SALPINGECTOMY AND RIGHT OOPHORECTOMY FOR ABNORMALITY   LUMBAR LAMINECTOMY N/A 09/20/2021   Procedure: L3-4 AND L4-5 CENTRAL LUMBAR LAMINECTOMIES;  Surgeon: Kerrin Champagne, MD;  Location: [facility];  Service: Orthopedics;   Laterality: N/A;   MASS EXCISION  02/15/2012   Procedure: EXCISION MASS;  Surgeon: Marlowe Shores, MD;  Location: [facility];  Service: Orthopedics;  Laterality: Right;  Excision of Right Small Volar Mass   MYELOGRAM     TONSILLECTOMY  age 42   TOTAL HIP ARTHROPLASTY Left 04/25/2018   Procedure: TOTAL HIP ARTHROPLASTY ANTERIOR APPROACH;  Surgeon: Samson Frederic, MD;  Location: [facility];  Service: Orthopedics;  Laterality: Left;   TRIGGER FINGER RELEASE Right 2018   thumb   WISDOM TOOTH EXTRACTION  age 31    ROS: Review of Systems Negative except as stated above  PHYSICAL EXAM: BP (!) 140/92   Pulse (!) 102   Temp 98.1 F (36.7 C) (Oral)   Resp 16   Wt 232 lb 12.8 oz (105.6 kg)   LMP 05/04/2012   SpO2 90%   BMI 38.74 kg/m   Wt Readings from Last 3 Encounters:  02/08/23 232 lb 12.8 oz (105.6 kg)  01/11/23 225 lb (102.1 kg)  09/28/22 228 lb 6.4 oz (103.6 kg)   Physical Exam HENT:     Head: Normocephalic and atraumatic.     Nose: Nose normal.     Mouth/Throat:     Mouth: Mucous membranes are moist.     Pharynx: Oropharynx is clear.  Eyes:     Extraocular Movements: Extraocular movements intact.     Conjunctiva/sclera: Conjunctivae normal.     Pupils: Pupils are equal, round, and reactive to light.  Cardiovascular:     Rate and Rhythm: Tachycardia present.     Pulses: Normal pulses.     Heart sounds: Normal heart sounds.  Pulmonary:     Effort: Pulmonary effort is normal.     Breath sounds: Normal breath sounds.  Musculoskeletal:        General: Normal range of motion.     Cervical back: Normal range of motion and neck supple.  Neurological:     General: No focal deficit present.     Mental Status: She is  alert and oriented to person, place, and time.  Psychiatric:        Mood and Affect: Mood normal.        Behavior: Behavior normal.      ASSESSMENT AND PLAN: 1. Encounter for weight management 2. BMI 38.0-38.9,adult - Continue  Phentermine as prescribed. Counseled on medication adherence/adverse effects.  - I did check the [facility] prescription drug database.  - Follow-up with primary provider in 4 weeks or sooner if needed for weight check.  - phentermine 37.5 MG capsule; Take 1 capsule (37.5 mg total) by mouth every morning.  Dispense: 30 capsule; Refill: 0  3. Upper respiratory symptom - Patient today in office with no cardiopulmonary/acute distress.  - Patient declined respiratory panel. - Prednisone and Brompheniramine-Pseudoephedrine-DM as prescribed. Counseled on medication adherence/adverse effects.  - Follow-up with primary provider as scheduled.  - predniSONE (DELTASONE) 10 MG tablet; Take 6 tablets (60 mg total) by mouth daily with breakfast for 1 day, THEN 5 tablets (50 mg total) daily with breakfast for 1 day, THEN 4 tablets (40 mg total) daily with breakfast for 1 day, THEN 3 tablets (30 mg total) daily with breakfast for 1 day, THEN 2 tablets (20 mg total) daily with breakfast for 1 day, THEN 1 tablet (10 mg total) daily with breakfast for 1 day.  Dispense: 21 tablet; Refill: 0 - brompheniramine-pseudoephedrine-DM 30-2-10 MG/5ML syrup; Take 5 mLs by mouth 4 (four) times daily as needed.  Dispense: 120 mL; Refill: 0   Patient was given the opportunity to ask questions.  Patient verbalized understanding of the plan and was able to repeat key elements of the plan. Patient was given clear instructions to go to Emergency Department or return to medical center if symptoms don't improve, worsen, or new problems develop.The patient verbalized understanding.    Requested Prescriptions   Signed Prescriptions Disp Refills   phentermine 37.5 MG capsule 30 capsule 0    Sig: Take 1 capsule (37.5 mg total) by mouth every morning.   predniSONE (DELTASONE) 10 MG tablet 21 tablet 0    Sig: Take 6 tablets (60 mg total) by mouth daily with breakfast for 1 day, THEN 5 tablets (50 mg total) daily with breakfast for 1  day, THEN 4 tablets (40 mg total) daily with breakfast for 1 day, THEN 3 tablets (30 mg total) daily with breakfast for 1 day, THEN 2 tablets (20 mg total) daily with breakfast for 1 day, THEN 1 tablet (10 mg total) daily with breakfast for 1 day.   brompheniramine-pseudoephedrine-DM 30-2-10 MG/5ML syrup 120 mL 0    Sig: Take 5 mLs by mouth 4 (four) times daily as needed.    Return in about 4 weeks (around 03/08/2023) for Follow-Up or next available weight check .  Rema Fendt, NP

## 2023-02-14 ENCOUNTER — Ambulatory Visit: Payer: 59 | Admitting: Physical Therapy

## 2023-02-14 ENCOUNTER — Encounter: Payer: Self-pay | Admitting: Physical Therapy

## 2023-02-14 DIAGNOSIS — M25552 Pain in left hip: Secondary | ICD-10-CM | POA: Diagnosis not present

## 2023-02-14 DIAGNOSIS — M6281 Muscle weakness (generalized): Secondary | ICD-10-CM

## 2023-02-14 NOTE — Therapy (Signed)
OUTPATIENT PHYSICAL THERAPY LOWER EXTREMITY TREATMENT   Patient Name: Darlene Crosby MRN: [id number] DOB:1965/05/11, 57 y.o., female Today's Date: 02/14/2023  END OF SESSION:  PT End of Session - 02/14/23 1107     Visit Number 2    Number of Visits 17    Date for PT Re-Evaluation 03/30/23    Authorization Type [employer]/M[employer]    Progress Note Due on Visit 10    PT Start Time 1106    PT Stop Time 1147    PT Time Calculation (min) 41 min    Activity Tolerance Patient tolerated treatment well;No increased pain    Behavior During Therapy WFL for tasks assessed/performed              Past Medical History:  Diagnosis Date   Asthma    followed by pcp   Bronchitis    "chronic"   Hypertension    OA (osteoarthritis)    left knee, right shoulder, left hip   Pneumonia    "a couple years ago" per pt on 09/15/21   Seasonal allergies    Wears glasses    Past Surgical History:  Procedure Laterality Date   ANTERIOR HIP REVISION Left 06/06/2018   Procedure: LEFT HIP WOUND DEHISCENCE POSSIBLE HEAD AND LINER EXCHANGE;  Surgeon: Samson Frederic, MD;  Location: [facility];  Service: Orthopedics;  Laterality: Left;   COLONOSCOPY  2015   DIAGNOSTIC LAPAROSCOPY     tubal preg-took ovary and tube   LAPAROSCOPY FOR ECTOPIC PREGNANCY  1990s   LEFT SALPINGECTOMY AND RIGHT OOPHORECTOMY FOR ABNORMALITY   LUMBAR LAMINECTOMY N/A 09/20/2021   Procedure: L3-4 AND L4-5 CENTRAL LUMBAR LAMINECTOMIES;  Surgeon: Kerrin Champagne, MD;  Location: [facility];  Service: Orthopedics;  Laterality: N/A;   MASS EXCISION  02/15/2012   Procedure: EXCISION MASS;  Surgeon: Marlowe Shores, MD;  Location: [facility];  Service: Orthopedics;  Laterality: Right;  Excision of Right Small Volar Mass   MYELOGRAM     TONSILLECTOMY  age 52   TOTAL HIP ARTHROPLASTY Left 04/25/2018   Procedure: TOTAL HIP ARTHROPLASTY ANTERIOR APPROACH;  Surgeon: Samson Frederic, MD;  Location: [facility];  Service: Orthopedics;  Laterality:  Left;   TRIGGER FINGER RELEASE Right 2018   thumb   WISDOM TOOTH EXTRACTION  age 33   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 04/12/2022   Status post lumbar laminectomy 09/20/2021   Body mass index (BMI) 37.0-37.9, adult 03/14/2019   Lumbar stenosis without neurogenic claudication 03/14/2019   Hyperlipidemia 02/08/2019   Lumbar spondylosis 10/25/2018   Degeneration of lumbar intervertebral disc 10/01/2018   Surgical wound dehiscence 06/06/2018   Osteoarthritis of left hip 04/25/2018   Spinal stenosis of lumbar region 02/23/2018   Pain in left knee 01/10/2018   Trigger thumb of right hand 03/30/2017   Symptomatic mammary hypertrophy 11/28/2014   Dyspnea 03/26/2013   Asthma vs VCD  02/28/2013   Hypertensive disorder 06/07/2012   Pyogenic granuloma 02/15/2012    PCP: Rema Fendt, NP  REFERRING PROVIDER: London Sheer, MD  REFERRING DIAG: M70.62 (ICD-10-CM) - Trochanteric bursitis, left hip  THERAPY DIAG:  Pain in left hip  Muscle weakness (generalized)  Rationale for Evaluation and Treatment: Rehabilitation  ONSET DATE: acute on chronic, about a month ago  SUBJECTIVE:   SUBJECTIVE STATEMENT: 02-14-23  I am a 5/10 pain on left hip today.  I brought my cane because  I felt a little unsteady. Maybe the medicine I am taking.    EVAL- Pt endorses longstanding history of back and hip pain with surgeries (2023 and 2020 respectively). Reports worsening of hip pain over past month without change in activity or injury. States it does feel similar to pain from around time of surgery. Feels her hip is weak during car transfers and has reduced tolerance to standing/walking. Uses AD as needed. Denies N/T, no fevers/chills, no bowel/bladder issues.   PERTINENT HISTORY: asthma, HTN, prior lumbar laminectomy July 2023 PAIN:  Are you having pain: 5/10 Location/description: sharp and throbbing, L hip,  anterior and lateral Best-worst over past week: 5-8/10 (takes a while to settle)  - aggravating factors: stairs, standing/walking (~10-41min, more limited by back), community navigation, sleeping on side, car transfer, laundry and other housework - Easing factors: icing, use of AD, recliner   PRECAUTIONS: None  WEIGHT BEARING RESTRICTIONS: No  FALLS:  Has patient fallen in last 6 months? No  LIVING ENVIRONMENT: Lives w/ daughter and 2 grandchildren 1 story home, 1STE Pt does housework, does require rest breaks due to back Has a walker she uses in mornings, uses cane when she is in the gym and in community   OCCUPATION: not working - enjoys walking in gym twice a week  PLOF: Independent  PATIENT GOALS: wants to try to exercise two days a week, wants to try to get stronger   NEXT MD VISIT: January 2025  OBJECTIVE:  Note: Objective measures were completed at Evaluation unless otherwise noted.  DIAGNOSTIC FINDINGS:  01/18/23 Lumbar XR, per results narrative: "XRs of the lumbar spine from 01/18/2023 were independently reviewed and  interpreted, showing laminectomy defect from L3-5. No evidence of  instability on flexion/extension views. Disc height loss with anterior  osteophyte formation at multiple levels. No fracture or dislocation seen. "  PATIENT SURVEYS:  FOTO 52 > 66  COGNITION: Overall cognitive status: Within functional limits for tasks assessed     SENSATION: LT intact, no sensory complaints   LOWER EXTREMITY ROM:     Active  Right eval Left eval  Hip flexion    Hip extension    Hip internal rotation    Hip external rotation    Knee extension    Knee flexion    (Blank rows = not tested) (Key: WFL = within functional limits not formally assessed, * = concordant pain, s = stiffness/stretching sensation, NT = not tested)  Comments: not formally measured but pt is able to achieve ~45 deg of active L hip flexion in standing position  LOWER EXTREMITY MMT:     MMT Right eval Left eval  Hip flexion 4- 3- *  Hip abduction (modified sitting) 4+ 4+  Hip internal rotation 4+ 3- *  Hip external rotation 4+ 3- *  Knee flexion 5 4  Knee extension 5 5  Ankle dorsiflexion     (Blank rows = not tested) (Key: WFL = within functional limits not formally assessed, * = concordant pain, s = stiffness/stretching sensation, NT = not tested)  Comments:     FUNCTIONAL TESTS:  5xSTS: 13.49sec UE support standard chair   GAIT: Distance walked: within clinic Assistive device utilized: None Level of assistance: Complete Independence Comments: reduced gait speed/cadence, reduced stance time L hip, reduced clearance L LE   TODAY'S TREATMENT:   [facility] Adult PT Treatment:  DATE: 02-14-23 Therapeutic Exercise: Side-lying on Right for left hip clams with RTB 3 x10 Sit to stand 2 x 8 Standing March with back of chair arm support  2 x 10 Standing Hip Abduction with RTB 3 x 10 Standing Hip Extension with RTB 3 x 10 Supine Piriformis Stretch with Foot on Ground   3 reps 15-30 sec hold Supine Bridge with Mini Swiss Ball Between Knees  3 x 10 Manual Therapy: STW over gluteals and piriformis Myofascial releases of Left quadratus lumborum LAD Left LE Trigger Point Dry-Needling performed     by Garen Lah Treatment instructions: Expect mild to moderate muscle soreness. S/S of pneumothorax if dry needled over a lung field, and to seek immediate medical attention should they occur. Patient verbalized understanding of these instructions and education.  Patient Consent Given: Yes Education handout provided: Previously provided Muscles treated:  L Quadratus lumborum, R gluteals and piriformis Electrical stimulation performed: No Parameters: N/A Treatment response/outcome: twitch response noted, pt noted relief  Modalities: Moist hot pack                                                                                                                              [facility] Adult PT Treatment:                                                DATE: 02/02/23 Therapeutic Exercise: STS x5 with UE support cues for pacing and control Standing march x5 each LE w/in comfortable ROM HEP handout + education, education on rationale for interventions, monitoring symptoms and appropriate modifications    PATIENT EDUCATION:  Education details: Pt education on PT impairments, prognosis, and POC. Informed consent. Rationale for interventions, safe/appropriate HEP performance Person educated: Patient Education method: Explanation, Demonstration, Tactile cues, Verbal cues, and Handouts Education comprehension: verbalized understanding, returned demonstration, verbal cues required, tactile cues required, and needs further education    HOME EXERCISE PROGRAM: Access Code: [email or web address] URL: [email or web address] Date: 02/02/2023 Prepared by: Fransisco Hertz  Exercises - Sit to Stand with Armchair  - 2-3 x daily - 1 sets - 5 reps - Standing March with Counter Support  - 2-3 x daily - 1 sets - 5 reps Added 02-14-23 - Standing Hip Abduction with Resistance at Ankles and Counter Support  - 1 x daily - 7 x weekly - 3 sets - 10 reps - Standing Hip Extension with Resistance at Ankles and Counter Support  - 1 x daily - 7 x weekly - 3 sets - 10 reps - Clam with Resistance  - 1 x daily - 7 x weekly - 3 sets - 10 reps - Supine Piriformis Stretch with Foot on Ground  - 1 x daily - 7 x weekly - 1 sets - 5 reps - 15-30 sec hold - Supine Bridge with Mini Swiss  Ball Between Knees  - 1 x daily - 7 x weekly - 3 sets - 10 reps ASSESSMENT:  CLINICAL IMPRESSION:  Ms Stene enters clinic with use of SPC and complaint of 6/10 pain in left hip. Pt consents to TPDN and is closely monitored throughout session with decrease in pain following TPDN and manual to 4/10. HEP updated today for added resistance and challenge with Red T band for  standing and clam exercises.  Pt left with fatigued muscles but overall pain decreased to 4/10 at end of session.   EVAL- Patient is a pleasant 57 y.o. woman who was seen today for physical therapy evaluation and treatment for L hip pain ongoing for several years but worsening over past month or so. She endorses weakness in hip and reduced tolerance to standing/walking. On exam she demonstrates reduced activity mobility of L hip and weakness of L hip flexors, rotational musculature, and hamstring. 5xSTS around cutoff time for fall risk but does require UE support. Tolerates HEP and exam well without adverse event or increase in resting pain, increased time w/ education on appropriate performance at home and rationale for interventions. Recommend trial of skilled PT to address aforementioned deficits with aim of improving functional tolerance and reducing pain with typical activities. Pt departs today's session in no acute distress, all voiced concerns/questions addressed appropriately from PT perspective.    OBJECTIVE IMPAIRMENTS: Abnormal gait, decreased activity tolerance, decreased endurance, decreased mobility, difficulty walking, decreased ROM, decreased strength, impaired perceived functional ability, improper body mechanics, postural dysfunction, and pain.   ACTIVITY LIMITATIONS: carrying, lifting, bending, sitting, standing, squatting, sleeping, stairs, transfers, and locomotion level  PARTICIPATION LIMITATIONS: meal prep, cleaning, laundry, shopping, and community activity  PERSONAL FACTORS: Age, Time since onset of injury/illness/exacerbation, and 3+ comorbidities: asthma, HTN, prior surgeries  are also affecting patient's functional outcome.   REHAB POTENTIAL: Good  CLINICAL DECISION MAKING: Stable/uncomplicated  EVALUATION COMPLEXITY: Low   GOALS: Goals reviewed with patient? Yes  SHORT TERM GOALS: Target date: 03/02/2023 Pt will demonstrate appropriate understanding and performance  of initially prescribed HEP in order to facilitate improved independence with management of symptoms.  Baseline: HEP provided on eval Goal status: INITIAL   2. Pt will score greater than or equal to 59 on FOTO in order to demonstrate improved perception of function due to symptoms.  Baseline: 52  Goal status: INITIAL    LONG TERM GOALS: Target date: 03/30/2023 Pt will score 66 or greater on FOTO in order to demonstrate improved perception of function due to symptoms.  Baseline: 52 Goal status: INITIAL  2.  Pt will demonstrate at least 4/5 L hip flexion MMT in order to facilitate improved LE strength for transfers/gait. Baseline: see MMT chart above Goal status: INITIAL  3.  Pt will report at least 50% decrease in overall pain levels in past week in order to facilitate improved tolerance to basic ADLs/mobility.   Baseline: 5-8/10  Goal status: INITIAL    4.  Pt will be able to perform 5xSTS in less than or equal to 10sec in order to demonstrate reduced fall risk and improved functional independence ([facility] 5xSTS = 2.3 sec). Baseline: 13sec w/ UE support Goal status: INITIAL   5. Pt will report ability to perform typical household tasks such as cleaning/laundry with less than 3 pt increase in L hip pain on NPS in order to facilitate improved independence w/ daily tasks.  Baseline: increased pain w/ household tasks  Goal status: INITIAL  6. Pt will demonstrate appropriate  performance of final prescribed HEP in order to facilitate improved self-management of symptoms post-discharge.   Baseline: initial HEP prescribed  Goal status: INITIAL     PLAN:  PT FREQUENCY: 2x/week  PT DURATION: 8 weeks  PLANNED INTERVENTIONS: 97164- PT Re-evaluation, 97110-Therapeutic exercises, 97530- Therapeutic activity, [id number]- Neuromuscular re-education, 97535- Self Care, [id number]- Manual therapy, [phone]- Gait training, [phone]- Aquatic Therapy, Patient/Family education, Balance training, Stair training, Taping,  Dry Needling, Joint mobilization, Spinal mobilization, Cryotherapy, and Moist heat  PLAN FOR NEXT SESSION: Review/update HEP PRN. Work on [employer] exercises as appropriate with emphasis on L hip strength and functional mechanics. Symptom modification strategies as indicated/appropriate.   Garen Lah, PT, [facility] Certified Exercise Expert for the Aging Adult  02/14/23 11:52 AM Phone: [phone] Fax: [phone]

## 2023-02-14 NOTE — Patient Instructions (Signed)

## 2023-02-15 NOTE — Therapy (Signed)
OUTPATIENT PHYSICAL THERAPY LOWER EXTREMITY TREATMENT   Patient Name: LAELANI DAUGHTRIDGE MRN: [id number] DOB:07-17-1965, 57 y.o., female Today's Date: 02/16/2023  END OF SESSION:  PT End of Session - 02/16/23 1100     Visit Number 3    Number of Visits 17    Date for PT Re-Evaluation 03/30/23    Authorization Type [employer]/M[employer]    Authorization Time Period no auth required per eval appt notes    Progress Note Due on Visit 10    PT Start Time 1102    PT Stop Time 1145    PT Time Calculation (min) 43 min    Activity Tolerance Patient tolerated treatment well;No increased pain    Behavior During Therapy WFL for tasks assessed/performed               Past Medical History:  Diagnosis Date   Asthma    followed by pcp   Bronchitis    "chronic"   Hypertension    OA (osteoarthritis)    left knee, right shoulder, left hip   Pneumonia    "a couple years ago" per pt on 09/15/21   Seasonal allergies    Wears glasses    Past Surgical History:  Procedure Laterality Date   ANTERIOR HIP REVISION Left 06/06/2018   Procedure: LEFT HIP WOUND DEHISCENCE POSSIBLE HEAD AND LINER EXCHANGE;  Surgeon: Samson Frederic, MD;  Location: [facility];  Service: Orthopedics;  Laterality: Left;   COLONOSCOPY  2015   DIAGNOSTIC LAPAROSCOPY     tubal preg-took ovary and tube   LAPAROSCOPY FOR ECTOPIC PREGNANCY  1990s   LEFT SALPINGECTOMY AND RIGHT OOPHORECTOMY FOR ABNORMALITY   LUMBAR LAMINECTOMY N/A 09/20/2021   Procedure: L3-4 AND L4-5 CENTRAL LUMBAR LAMINECTOMIES;  Surgeon: Kerrin Champagne, MD;  Location: [facility];  Service: Orthopedics;  Laterality: N/A;   MASS EXCISION  02/15/2012   Procedure: EXCISION MASS;  Surgeon: Marlowe Shores, MD;  Location: [facility];  Service: Orthopedics;  Laterality: Right;  Excision of Right Small Volar Mass   MYELOGRAM     TONSILLECTOMY  age 72   TOTAL HIP ARTHROPLASTY Left 04/25/2018   Procedure: TOTAL HIP ARTHROPLASTY ANTERIOR APPROACH;  Surgeon:  Samson Frederic, MD;  Location: [facility];  Service: Orthopedics;  Laterality: Left;   TRIGGER FINGER RELEASE Right 2018   thumb   WISDOM TOOTH EXTRACTION  age 78   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 04/12/2022   Status post lumbar laminectomy 09/20/2021   Body mass index (BMI) 37.0-37.9, adult 03/14/2019   Lumbar stenosis without neurogenic claudication 03/14/2019   Hyperlipidemia 02/08/2019   Lumbar spondylosis 10/25/2018   Degeneration of lumbar intervertebral disc 10/01/2018   Surgical wound dehiscence 06/06/2018   Osteoarthritis of left hip 04/25/2018   Spinal stenosis of lumbar region 02/23/2018   Pain in left knee 01/10/2018   Trigger thumb of right hand 03/30/2017   Symptomatic mammary hypertrophy 11/28/2014   Dyspnea 03/26/2013   Asthma vs VCD  02/28/2013   Hypertensive disorder 06/07/2012   Pyogenic granuloma 02/15/2012    PCP: Rema Fendt, NP  REFERRING PROVIDER: London Sheer, MD  REFERRING DIAG: M70.62 (ICD-10-CM) - Trochanteric bursitis, left hip  THERAPY DIAG:  Pain in left hip  Muscle weakness (generalized)  Difficulty in walking, not elsewhere classified  Rationale for Evaluation and Treatment: Rehabilitation  ONSET DATE: acute on chronic, about a month ago  SUBJECTIVE:  SUBJECTIVE STATEMENT: 02-16-23  I am a 4/10 pain on left hip today.     EVAL- Pt endorses longstanding history of back and hip pain with surgeries (2023 and 2020 respectively). Reports worsening of hip pain over past month without change in activity or injury. States it does feel similar to pain from around time of surgery. Feels her hip is weak during car transfers and has reduced tolerance to standing/walking. Uses AD as needed. Denies N/T, no fevers/chills, no bowel/bladder issues.   PERTINENT HISTORY: asthma, HTN, prior lumbar laminectomy July 2023 PAIN:  Are you having pain:  5/10 Location/description: sharp and throbbing, L hip, anterior and lateral Best-worst over past week: 5-8/10 (takes a while to settle)  - aggravating factors: stairs, standing/walking (~10-40min, more limited by back), community navigation, sleeping on side, car transfer, laundry and other housework - Easing factors: icing, use of AD, recliner   PRECAUTIONS: None  WEIGHT BEARING RESTRICTIONS: No  FALLS:  Has patient fallen in last 6 months? No  LIVING ENVIRONMENT: Lives w/ daughter and 2 grandchildren 1 story home, 1STE Pt does housework, does require rest breaks due to back Has a walker she uses in mornings, uses cane when she is in the gym and in community   OCCUPATION: not working - enjoys walking in gym twice a week  PLOF: Independent  PATIENT GOALS: wants to try to exercise two days a week, wants to try to get stronger   NEXT MD VISIT: January 2025  OBJECTIVE:  Note: Objective measures were completed at Evaluation unless otherwise noted.  DIAGNOSTIC FINDINGS:  01/18/23 Lumbar XR, per results narrative: "XRs of the lumbar spine from 01/18/2023 were independently reviewed and  interpreted, showing laminectomy defect from L3-5. No evidence of  instability on flexion/extension views. Disc height loss with anterior  osteophyte formation at multiple levels. No fracture or dislocation seen. "  PATIENT SURVEYS:  FOTO 52 > 66  COGNITION: Overall cognitive status: Within functional limits for tasks assessed     SENSATION: LT intact, no sensory complaints   LOWER EXTREMITY ROM:     Active  Right eval Left eval  Hip flexion    Hip extension    Hip internal rotation    Hip external rotation    Knee extension    Knee flexion    (Blank rows = not tested) (Key: WFL = within functional limits not formally assessed, * = concordant pain, s = stiffness/stretching sensation, NT = not tested)  Comments: not formally measured but pt is able to achieve ~45 deg of active L  hip flexion in standing position  LOWER EXTREMITY MMT:    MMT Right eval Left eval  Hip flexion 4- 3- *  Hip abduction (modified sitting) 4+ 4+  Hip internal rotation 4+ 3- *  Hip external rotation 4+ 3- *  Knee flexion 5 4  Knee extension 5 5  Ankle dorsiflexion     (Blank rows = not tested) (Key: WFL = within functional limits not formally assessed, * = concordant pain, s = stiffness/stretching sensation, NT = not tested)  Comments:     FUNCTIONAL TESTS:  5xSTS: 13.49sec UE support standard chair   GAIT: Distance walked: within clinic Assistive device utilized: None Level of assistance: Complete Independence Comments: reduced gait speed/cadence, reduced stance time L hip, reduced clearance L LE   TODAY'S TREATMENT:   [facility] Adult PT Treatment:  DATE: 02-16-23 Therapeutic Exercise: Nustep UE/LE 5 minutes  Sit to stand 3 x 8 with 10 # KB Standing March with back of chair arm support  2 x 10 Standing Hip Abduction with RTB 3 x 10 Standing Hip Extension with RTB 3 x 10 Supine Piriformis Stretch with Foot on Ground   3 reps 15-30 sec hold Hip flexor stretch off of mat on left  60 sec x 1 Supine Bridge with Mini Swiss Ball Between Knees  3 x 10 Side-lying on Right for left hip clams with RTB 3 x10 Prone quad stretch with green strap Manual Therapy: Left hip AP mobs Grade III-IV LAD on Left STW over Left Quad  [facility] Adult PT Treatment:                                                DATE: 02-14-23 Therapeutic Exercise: Side-lying on Right for left hip clams with RTB 3 x10 Sit to stand 2 x 8 Standing March with back of chair arm support  2 x 10 Standing Hip Abduction with RTB 3 x 10 Standing Hip Extension with RTB 3 x 10 Supine Piriformis Stretch with Foot on Ground   3 reps 15-30 sec hold Supine Bridge with Mini Swiss Ball Between Knees  3 x 10 Manual Therapy: STW over gluteals and piriformis Myofascial releases of Left  quadratus lumborum LAD Left LE Trigger Point Dry-Needling performed     by Garen Lah Treatment instructions: Expect mild to moderate muscle soreness. S/S of pneumothorax if dry needled over a lung field, and to seek immediate medical attention should they occur. Patient verbalized understanding of these instructions and education.  Patient Consent Given: Yes Education handout provided: Previously provided Muscles treated:  L Quadratus lumborum, R gluteals and piriformis Electrical stimulation performed: No Parameters: N/A Treatment response/outcome: twitch response noted, pt noted relief  Modalities: Moist hot pack                                                                                                                             [facility] Adult PT Treatment:                                                DATE: 02/02/23 Therapeutic Exercise: STS x5 with UE support cues for pacing and control Standing march x5 each LE w/in comfortable ROM HEP handout + education, education on rationale for interventions, monitoring symptoms and appropriate modifications    PATIENT EDUCATION:  Education details: Pt education on PT impairments, prognosis, and POC. Informed consent. Rationale for interventions, safe/appropriate HEP performance Person educated: Patient Education method: Explanation, Demonstration, Tactile cues, Verbal cues, and Handouts Education comprehension: verbalized understanding, returned  demonstration, verbal cues required, tactile cues required, and needs further education    HOME EXERCISE PROGRAM: Access Code: [email or web address] URL: [email or web address] Date: 02/02/2023 Prepared by: Fransisco Hertz  Exercises - Sit to Stand with Armchair  - 2-3 x daily - 1 sets - 5 reps - Standing March with Counter Support  - 2-3 x daily - 1 sets - 5 reps Added 02-14-23 - Standing Hip Abduction with Resistance at Ankles and Counter Support  - 1 x daily - 7 x weekly - 3 sets -  10 reps - Standing Hip Extension with Resistance at Ankles and Counter Support  - 1 x daily - 7 x weekly - 3 sets - 10 reps - Clam with Resistance  - 1 x daily - 7 x weekly - 3 sets - 10 reps - Supine Piriformis Stretch with Foot on Ground  - 1 x daily - 7 x weekly - 1 sets - 5 reps - 15-30 sec hold - Supine Bridge with Mini Swiss Ball Between Knees  - 1 x daily - 7 x weekly - 3 sets - 10 reps ASSESSMENT:  CLINICAL IMPRESSION:  Ms Charnock enters clinic with use of SPC and reports improvement of pain to 4/10 left hip. Pt declined TPDN. HEP reinforced today using GTB for resistance.  Pt with tight left quads and benefitted from STW and prone quad stretching and Left hip mobs for decrease in pain and increased flexibility.  Pt left with fatigued muscles but overall pain decreased  with no specific NPS number.   EVAL- Patient is a pleasant 57 y.o. woman who was seen today for physical therapy evaluation and treatment for L hip pain ongoing for several years but worsening over past month or so. She endorses weakness in hip and reduced tolerance to standing/walking. On exam she demonstrates reduced activity mobility of L hip and weakness of L hip flexors, rotational musculature, and hamstring. 5xSTS around cutoff time for fall risk but does require UE support. Tolerates HEP and exam well without adverse event or increase in resting pain, increased time w/ education on appropriate performance at home and rationale for interventions. Recommend trial of skilled PT to address aforementioned deficits with aim of improving functional tolerance and reducing pain with typical activities. Pt departs today's session in no acute distress, all voiced concerns/questions addressed appropriately from PT perspective.    OBJECTIVE IMPAIRMENTS: Abnormal gait, decreased activity tolerance, decreased endurance, decreased mobility, difficulty walking, decreased ROM, decreased strength, impaired perceived functional ability,  improper body mechanics, postural dysfunction, and pain.   ACTIVITY LIMITATIONS: carrying, lifting, bending, sitting, standing, squatting, sleeping, stairs, transfers, and locomotion level  PARTICIPATION LIMITATIONS: meal prep, cleaning, laundry, shopping, and community activity  PERSONAL FACTORS: Age, Time since onset of injury/illness/exacerbation, and 3+ comorbidities: asthma, HTN, prior surgeries  are also affecting patient's functional outcome.   REHAB POTENTIAL: Good  CLINICAL DECISION MAKING: Stable/uncomplicated  EVALUATION COMPLEXITY: Low   GOALS: Goals reviewed with patient? Yes  SHORT TERM GOALS: Target date: 03/02/2023 Pt will demonstrate appropriate understanding and performance of initially prescribed HEP in order to facilitate improved independence with management of symptoms.  Baseline: HEP provided on eval Goal status: MET  2. Pt will score greater than or equal to 59 on FOTO in order to demonstrate improved perception of function due to symptoms.  Baseline: 52  Goal status: INITIAL    LONG TERM GOALS: Target date: 03/30/2023 Pt will score 66 or greater on FOTO in order to demonstrate improved perception of function due  to symptoms.  Baseline: 52 Goal status: INITIAL  2.  Pt will demonstrate at least 4/5 L hip flexion MMT in order to facilitate improved LE strength for transfers/gait. Baseline: see MMT chart above Goal status: INITIAL  3.  Pt will report at least 50% decrease in overall pain levels in past week in order to facilitate improved tolerance to basic ADLs/mobility.   Baseline: 5-8/10  Goal status: INITIAL    4.  Pt will be able to perform 5xSTS in less than or equal to 10sec in order to demonstrate reduced fall risk and improved functional independence (MCID 5xSTS = 2.3 sec). Baseline: 13sec w/ UE support Goal status: INITIAL   5. Pt will report ability to perform typical household tasks such as cleaning/laundry with less than 3 pt increase in L  hip pain on NPS in order to facilitate improved independence w/ daily tasks.  Baseline: increased pain w/ household tasks  Goal status: INITIAL  6. Pt will demonstrate appropriate performance of final prescribed HEP in order to facilitate improved self-management of symptoms post-discharge.   Baseline: initial HEP prescribed  Goal status: INITIAL     PLAN:  PT FREQUENCY: 2x/week  PT DURATION: 8 weeks  PLANNED INTERVENTIONS: 97164- PT Re-evaluation, 97110-Therapeutic exercises, 97530- Therapeutic activity, [id number]- Neuromuscular re-education, 97535- Self Care, [id number]- Manual therapy, [phone]- Gait training, [phone]- Aquatic Therapy, Patient/Family education, Balance training, Stair training, Taping, Dry Needling, Joint mobilization, Spinal mobilization, Cryotherapy, and Moist heat  PLAN FOR NEXT SESSION: Review/update HEP PRN. Work on [employer] exercises as appropriate with emphasis on L hip strength and functional mechanics. Symptom modification strategies as indicated/appropriate.   Garen Lah, PT, [facility] Certified Exercise Expert for the Aging Adult  02/16/23 11:56 AM Phone: [phone] Fax: [phone]

## 2023-02-16 ENCOUNTER — Ambulatory Visit: Payer: 59 | Admitting: Physical Therapy

## 2023-02-16 DIAGNOSIS — M6281 Muscle weakness (generalized): Secondary | ICD-10-CM

## 2023-02-16 DIAGNOSIS — M25552 Pain in left hip: Secondary | ICD-10-CM

## 2023-02-16 DIAGNOSIS — R262 Difficulty in walking, not elsewhere classified: Secondary | ICD-10-CM

## 2023-02-20 NOTE — Therapy (Signed)
OUTPATIENT PHYSICAL THERAPY TREATMENT   Patient Name: ANSLEI KLAMER MRN: [id number] DOB:Jul 19, 1965, 57 y.o., female Today's Date: 02/21/2023  END OF SESSION:  PT End of Session - 02/21/23 1144     Visit Number 4    Number of Visits 17    Date for PT Re-Evaluation 03/30/23    Authorization Type [employer]/M[employer]    Authorization Time Period no auth required per eval appt notes    Progress Note Due on Visit 10    PT Start Time 1144    PT Stop Time 1235   moist heat unbilled end of session   PT Time Calculation (min) 51 min    Activity Tolerance Patient tolerated treatment well;No increased pain                Past Medical History:  Diagnosis Date   Asthma    followed by pcp   Bronchitis    "chronic"   Hypertension    OA (osteoarthritis)    left knee, right shoulder, left hip   Pneumonia    "a couple years ago" per pt on 09/15/21   Seasonal allergies    Wears glasses    Past Surgical History:  Procedure Laterality Date   ANTERIOR HIP REVISION Left 06/06/2018   Procedure: LEFT HIP WOUND DEHISCENCE POSSIBLE HEAD AND LINER EXCHANGE;  Surgeon: Samson Frederic, MD;  Location: [facility];  Service: Orthopedics;  Laterality: Left;   COLONOSCOPY  2015   DIAGNOSTIC LAPAROSCOPY     tubal preg-took ovary and tube   LAPAROSCOPY FOR ECTOPIC PREGNANCY  1990s   LEFT SALPINGECTOMY AND RIGHT OOPHORECTOMY FOR ABNORMALITY   LUMBAR LAMINECTOMY N/A 09/20/2021   Procedure: L3-4 AND L4-5 CENTRAL LUMBAR LAMINECTOMIES;  Surgeon: Kerrin Champagne, MD;  Location: [facility];  Service: Orthopedics;  Laterality: N/A;   MASS EXCISION  02/15/2012   Procedure: EXCISION MASS;  Surgeon: Marlowe Shores, MD;  Location: [facility];  Service: Orthopedics;  Laterality: Right;  Excision of Right Small Volar Mass   MYELOGRAM     TONSILLECTOMY  age 5   TOTAL HIP ARTHROPLASTY Left 04/25/2018   Procedure: TOTAL HIP ARTHROPLASTY ANTERIOR APPROACH;  Surgeon: Samson Frederic, MD;  Location: [facility];   Service: Orthopedics;  Laterality: Left;   TRIGGER FINGER RELEASE Right 2018   thumb   WISDOM TOOTH EXTRACTION  age 71   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 04/12/2022   Status post lumbar laminectomy 09/20/2021   Body mass index (BMI) 37.0-37.9, adult 03/14/2019   Lumbar stenosis without neurogenic claudication 03/14/2019   Hyperlipidemia 02/08/2019   Lumbar spondylosis 10/25/2018   Degeneration of lumbar intervertebral disc 10/01/2018   Surgical wound dehiscence 06/06/2018   Osteoarthritis of left hip 04/25/2018   Spinal stenosis of lumbar region 02/23/2018   Pain in left knee 01/10/2018   Trigger thumb of right hand 03/30/2017   Symptomatic mammary hypertrophy 11/28/2014   Dyspnea 03/26/2013   Asthma vs VCD  02/28/2013   Hypertensive disorder 06/07/2012   Pyogenic granuloma 02/15/2012    PCP: Rema Fendt, NP  REFERRING PROVIDER: London Sheer, MD  REFERRING DIAG: M70.62 (ICD-10-CM) - Trochanteric bursitis, left hip  THERAPY DIAG:  Pain in left hip  Muscle weakness (generalized)  Difficulty in walking, not elsewhere classified  Rationale for Evaluation and Treatment: Rehabilitation  ONSET DATE: acute on chronic, about a month ago  SUBJECTIVE:   SUBJECTIVE  STATEMENT: 02/21/2023 Pt reports sessions have been going well, seems like things are helping. No issues after last session. HEP going well. Still having difficulty lifting leg but is improving at times. Does note that she just got an injection in R hand earlier this morning. No other new updates  EVAL- Pt endorses longstanding history of back and hip pain with surgeries (2023 and 2020 respectively). Reports worsening of hip pain over past month without change in activity or injury. States it does feel similar to pain from around time of surgery. Feels her hip is weak during car transfers and has reduced tolerance to  standing/walking. Uses AD as needed. Denies N/T, no fevers/chills, no bowel/bladder issues.   PERTINENT HISTORY: asthma, HTN, prior lumbar laminectomy July 2023 PAIN:  Are you having pain: 7/10 L anterior hip   Per eval -  Location/description: sharp and throbbing, L hip, anterior and lateral Best-worst over past week: 5-8/10 (takes a while to settle)  - aggravating factors: stairs, standing/walking (~10-34min, more limited by back), community navigation, sleeping on side, car transfer, laundry and other housework - Easing factors: icing, use of AD, recliner   PRECAUTIONS: None  WEIGHT BEARING RESTRICTIONS: No  FALLS:  Has patient fallen in last 6 months? No  LIVING ENVIRONMENT: Lives w/ daughter and 2 grandchildren 1 story home, 1STE Pt does housework, does require rest breaks due to back Has a walker she uses in mornings, uses cane when she is in the gym and in community   OCCUPATION: not working - enjoys walking in gym twice a week  PLOF: Independent  PATIENT GOALS: wants to try to exercise two days a week, wants to try to get stronger   NEXT MD VISIT: January 2025  OBJECTIVE:  Note: Objective measures were completed at Evaluation unless otherwise noted.  DIAGNOSTIC FINDINGS:  01/18/23 Lumbar XR, per results narrative: "XRs of the lumbar spine from 01/18/2023 were independently reviewed and  interpreted, showing laminectomy defect from L3-5. No evidence of  instability on flexion/extension views. Disc height loss with anterior  osteophyte formation at multiple levels. No fracture or dislocation seen. "  PATIENT SURVEYS:  FOTO 52 > 66  COGNITION: Overall cognitive status: Within functional limits for tasks assessed     SENSATION: LT intact, no sensory complaints   LOWER EXTREMITY ROM:     Active  Right eval Left eval  Hip flexion    Hip extension    Hip internal rotation    Hip external rotation    Knee extension    Knee flexion    (Blank rows = not  tested) (Key: WFL = within functional limits not formally assessed, * = concordant pain, s = stiffness/stretching sensation, NT = not tested)  Comments: not formally measured but pt is able to achieve ~45 deg of active L hip flexion in standing position  LOWER EXTREMITY MMT:    MMT Right eval Left eval  Hip flexion 4- 3- *  Hip abduction (modified sitting) 4+ 4+  Hip internal rotation 4+ 3- *  Hip external rotation 4+ 3- *  Knee flexion 5 4  Knee extension 5 5  Ankle dorsiflexion     (Blank rows = not tested) (Key: WFL = within functional limits not formally assessed, * = concordant pain, s = stiffness/stretching sensation, NT = not tested)  Comments:     FUNCTIONAL TESTS:  5xSTS: 13.49sec UE support standard chair   GAIT: Distance walked: within clinic Assistive device utilized: None Level of assistance: Complete  Independence Comments: reduced gait speed/cadence, reduced stance time L hip, reduced clearance L LE   TODAY'S TREATMENT:   [facility] Adult PT Treatment:                                                DATE: 02/21/23 Therapeutic Exercise: Nu step LE only 4 min L2 STS from mat 2x15 no UE support cues for pacing and breath control Seated figure four LLE 2x30sec BIL cues for setup and breath control Standing hip flexor stretch LLE at counter 2x8 gently rocking, LUE support for balance  Standing LLE triple flexion x5 w/ LUE support at counter cues for posture and full ROM Standing LLE triple ext x8 LUE support cues for posture   Modalities: Moist heat L posterior hip at end of session, seated; 8 min no adverse events, good tolerance    [facility] Adult PT Treatment:                                                DATE: 02-16-23 Therapeutic Exercise: Nustep UE/LE 5 minutes  Sit to stand 3 x 8 with 10 # KB Standing March with back of chair arm support  2 x 10 Standing Hip Abduction with RTB 3 x 10 Standing Hip Extension with RTB 3 x 10 Supine Piriformis Stretch with Foot  on Ground   3 reps 15-30 sec hold Hip flexor stretch off of mat on left  60 sec x 1 Supine Bridge with Mini Swiss Ball Between Knees  3 x 10 Side-lying on Right for left hip clams with RTB 3 x10 Prone quad stretch with green strap  Manual Therapy: Left hip AP mobs Grade III-IV LAD on Left STW over Left Quad  [facility] Adult PT Treatment:                                                DATE: 02-14-23 Therapeutic Exercise: Side-lying on Right for left hip clams with RTB 3 x10 Sit to stand 2 x 8 Standing March with back of chair arm support  2 x 10 Standing Hip Abduction with RTB 3 x 10 Standing Hip Extension with RTB 3 x 10 Supine Piriformis Stretch with Foot on Ground   3 reps 15-30 sec hold Supine Bridge with Mini Swiss Ball Between Knees  3 x 10 Manual Therapy: STW over gluteals and piriformis Myofascial releases of Left quadratus lumborum LAD Left LE Trigger Point Dry-Needling performed     by Garen Lah Treatment instructions: Expect mild to moderate muscle soreness. S/S of pneumothorax if dry needled over a lung field, and to seek immediate medical attention should they occur. Patient verbalized understanding of these instructions and education.  Patient Consent Given: Yes Education handout provided: Previously provided Muscles treated:  L Quadratus lumborum, R gluteals and piriformis Electrical stimulation performed: No Parameters: N/A Treatment response/outcome: twitch response noted, pt noted relief  Modalities: Moist hot pack  PATIENT EDUCATION:  Education details: rationale for interventions, HEP  Person educated: Patient Education method: Explanation, Demonstration, Tactile cues, Verbal cues Education comprehension: verbalized understanding, returned demonstration, verbal cues required, tactile cues required, and needs further education      HOME EXERCISE PROGRAM: Access Code: [email or web address] URL: [email or web address] Date: 02/02/2023 Prepared by: Fransisco Hertz  Exercises - Sit to Stand with Armchair  - 2-3 x daily - 1 sets - 5 reps - Standing March with Counter Support  - 2-3 x daily - 1 sets - 5 reps Added 02-14-23 - Standing Hip Abduction with Resistance at Ankles and Counter Support  - 1 x daily - 7 x weekly - 3 sets - 10 reps - Standing Hip Extension with Resistance at Ankles and Counter Support  - 1 x daily - 7 x weekly - 3 sets - 10 reps - Clam with Resistance  - 1 x daily - 7 x weekly - 3 sets - 10 reps - Supine Piriformis Stretch with Foot on Ground  - 1 x daily - 7 x weekly - 1 sets - 5 reps - 15-30 sec hold - Supine Bridge with Mini Swiss Ball Between Knees  - 1 x daily - 7 x weekly - 3 sets - 10 reps ASSESSMENT:  CLINICAL IMPRESSION:  02/21/2023 Pt arrives w/ report of 7/10 pain in anterior hip, also received injection in R hand earlier this morning. Subsequently modified today's session to avoid use of RUE which pt does well with, cues throughout to maintain. Most difficulty with active hip flexion, relief with active and passive hip extension. Reports improved symptoms at end of session, requests moist heat after exercise which is respected and improves symptoms further. No adverse events, pt tolerates session well overall. Recommend continuing along current POC in order to address relevant deficits and improve functional tolerance. Pt departs today's session in no acute distress, all voiced questions/concerns addressed appropriately from PT perspective.     EVAL- Patient is a pleasant 57 y.o. woman who was seen today for physical therapy evaluation and treatment for L hip pain ongoing for several years but worsening over past month or so. She endorses weakness in hip and reduced tolerance to standing/walking. On exam she demonstrates reduced activity mobility of L hip and weakness of L hip flexors,  rotational musculature, and hamstring. 5xSTS around cutoff time for fall risk but does require UE support. Tolerates HEP and exam well without adverse event or increase in resting pain, increased time w/ education on appropriate performance at home and rationale for interventions. Recommend trial of skilled PT to address aforementioned deficits with aim of improving functional tolerance and reducing pain with typical activities. Pt departs today's session in no acute distress, all voiced concerns/questions addressed appropriately from PT perspective.    OBJECTIVE IMPAIRMENTS: Abnormal gait, decreased activity tolerance, decreased endurance, decreased mobility, difficulty walking, decreased ROM, decreased strength, impaired perceived functional ability, improper body mechanics, postural dysfunction, and pain.   ACTIVITY LIMITATIONS: carrying, lifting, bending, sitting, standing, squatting, sleeping, stairs, transfers, and locomotion level  PARTICIPATION LIMITATIONS: meal prep, cleaning, laundry, shopping, and community activity  PERSONAL FACTORS: Age, Time since onset of injury/illness/exacerbation, and 3+ comorbidities: asthma, HTN, prior surgeries  are also affecting patient's functional outcome.   REHAB POTENTIAL: Good  CLINICAL DECISION MAKING: Stable/uncomplicated  EVALUATION COMPLEXITY: Low   GOALS: Goals reviewed with patient? Yes  SHORT TERM GOALS: Target date: 03/02/2023 Pt will demonstrate appropriate understanding and performance of initially prescribed HEP in order to facilitate improved independence  with management of symptoms.  Baseline: HEP provided on eval Goal status: MET  2. Pt will score greater than or equal to 59 on FOTO in order to demonstrate improved perception of function due to symptoms.  Baseline: 52  Goal status: INITIAL    LONG TERM GOALS: Target date: 03/30/2023 Pt will score 66 or greater on FOTO in order to demonstrate improved perception of function due to  symptoms.  Baseline: 52 Goal status: INITIAL  2.  Pt will demonstrate at least 4/5 L hip flexion MMT in order to facilitate improved LE strength for transfers/gait. Baseline: see MMT chart above Goal status: INITIAL  3.  Pt will report at least 50% decrease in overall pain levels in past week in order to facilitate improved tolerance to basic ADLs/mobility.   Baseline: 5-8/10  Goal status: INITIAL    4.  Pt will be able to perform 5xSTS in less than or equal to 10sec in order to demonstrate reduced fall risk and improved functional independence (MCID 5xSTS = 2.3 sec). Baseline: 13sec w/ UE support Goal status: INITIAL   5. Pt will report ability to perform typical household tasks such as cleaning/laundry with less than 3 pt increase in L hip pain on NPS in order to facilitate improved independence w/ daily tasks.  Baseline: increased pain w/ household tasks  Goal status: INITIAL  6. Pt will demonstrate appropriate performance of final prescribed HEP in order to facilitate improved self-management of symptoms post-discharge.   Baseline: initial HEP prescribed  Goal status: INITIAL     PLAN:  PT FREQUENCY: 2x/week  PT DURATION: 8 weeks  PLANNED INTERVENTIONS: [id number]- PT Re-evaluation, 97110-Therapeutic exercises, 97530- Therapeutic activity, [id number]- Neuromuscular re-education, 97535- Self Care, [id number]- Manual therapy, [phone]- Gait training, [phone]- Aquatic Therapy, Patient/Family education, Balance training, Stair training, Taping, Dry Needling, Joint mobilization, Spinal mobilization, Cryotherapy, and Moist heat  PLAN FOR NEXT SESSION: Review/update HEP PRN. Work on [employer] exercises as appropriate with emphasis on L hip strength and functional mechanics. Symptom modification strategies as indicated/appropriate.    Ashley Murrain PT, DPT 02/21/2023 12:43 PM

## 2023-02-21 ENCOUNTER — Ambulatory Visit: Payer: 59 | Admitting: Physical Therapy

## 2023-02-21 ENCOUNTER — Encounter: Payer: Self-pay | Admitting: Physical Therapy

## 2023-02-21 DIAGNOSIS — M25552 Pain in left hip: Secondary | ICD-10-CM

## 2023-02-21 DIAGNOSIS — R262 Difficulty in walking, not elsewhere classified: Secondary | ICD-10-CM

## 2023-02-21 DIAGNOSIS — M6281 Muscle weakness (generalized): Secondary | ICD-10-CM

## 2023-02-23 ENCOUNTER — Other Ambulatory Visit: Payer: Self-pay | Admitting: Family

## 2023-02-23 NOTE — Therapy (Signed)
OUTPATIENT PHYSICAL THERAPY TREATMENT   Patient Name: DEJANELLE BRAUNS MRN: [id number] DOB:[birth year], 57 y.o., female Today's Date: 02/24/2023  END OF SESSION:  PT End of Session - 02/24/23 1102     Visit Number 5    Number of Visits 17    Date for PT Re-Evaluation 03/30/23    Authorization Type [employer]/M[employer]    Authorization Time Period no auth required per eval appt notes    Progress Note Due on Visit 10    PT Start Time 1102    PT Stop Time 1141    PT Time Calculation (min) 39 min    Activity Tolerance Patient tolerated treatment well;No increased pain                 Past Medical History:  Diagnosis Date   Asthma    followed by pcp   Bronchitis    "chronic"   Hypertension    OA (osteoarthritis)    left knee, right shoulder, left hip   Pneumonia    "a couple years ago" per pt on 09/15/21   Seasonal allergies    Wears glasses    Past Surgical History:  Procedure Laterality Date   ANTERIOR HIP REVISION Left 06/06/2018   Procedure: LEFT HIP WOUND DEHISCENCE POSSIBLE HEAD AND LINER EXCHANGE;  Surgeon: Samson Frederic, MD;  Location: [facility];  Service: Orthopedics;  Laterality: Left;   COLONOSCOPY  2015   DIAGNOSTIC LAPAROSCOPY     tubal preg-took ovary and tube   LAPAROSCOPY FOR ECTOPIC PREGNANCY  1990s   LEFT SALPINGECTOMY AND RIGHT OOPHORECTOMY FOR ABNORMALITY   LUMBAR LAMINECTOMY N/A 09/20/2021   Procedure: L3-4 AND L4-5 CENTRAL LUMBAR LAMINECTOMIES;  Surgeon: Kerrin Champagne, MD;  Location: [facility];  Service: Orthopedics;  Laterality: N/A;   MASS EXCISION  02/15/2012   Procedure: EXCISION MASS;  Surgeon: Marlowe Shores, MD;  Location: [facility];  Service: Orthopedics;  Laterality: Right;  Excision of Right Small Volar Mass   MYELOGRAM     TONSILLECTOMY  age 83   TOTAL HIP ARTHROPLASTY Left 04/25/2018   Procedure: TOTAL HIP ARTHROPLASTY ANTERIOR APPROACH;  Surgeon: Samson Frederic, MD;  Location: [facility];  Service: Orthopedics;  Laterality:  Left;   TRIGGER FINGER RELEASE Right 2018   thumb   WISDOM TOOTH EXTRACTION  age 58   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 04/12/2022   Status post lumbar laminectomy 09/20/2021   Body mass index (BMI) 37.0-37.9, adult 03/14/2019   Lumbar stenosis without neurogenic claudication 03/14/2019   Hyperlipidemia 02/08/2019   Lumbar spondylosis 10/25/2018   Degeneration of lumbar intervertebral disc 10/01/2018   Surgical wound dehiscence 06/06/2018   Osteoarthritis of left hip 04/25/2018   Spinal stenosis of lumbar region 02/23/2018   Pain in left knee 01/10/2018   Trigger thumb of right hand 03/30/2017   Symptomatic mammary hypertrophy 11/28/2014   Dyspnea 03/26/2013   Asthma vs VCD  02/28/2013   Hypertensive disorder 06/07/2012   Pyogenic granuloma 02/15/2012    PCP: Rema Fendt, NP  REFERRING PROVIDER: London Sheer, MD  REFERRING DIAG: M70.62 (ICD-10-CM) - Trochanteric bursitis, left hip  THERAPY DIAG:  Pain in left hip  Muscle weakness (generalized)  Difficulty in walking, not elsewhere classified  Rationale for Evaluation and Treatment: Rehabilitation  ONSET DATE: acute on chronic, about a month ago  SUBJECTIVE:   SUBJECTIVE STATEMENT: 02/24/2023 Pt states she felt good  after last session, no issues. Continues to note improvement in symptoms as PT progresses   EVAL- Pt endorses longstanding history of back and hip pain with surgeries (2023 and 2020 respectively). Reports worsening of hip pain over past month without change in activity or injury. States it does feel similar to pain from around time of surgery. Feels her hip is weak during car transfers and has reduced tolerance to standing/walking. Uses AD as needed. Denies N/T, no fevers/chills, no bowel/bladder issues.   PERTINENT HISTORY: asthma, HTN, prior lumbar laminectomy July 2023 PAIN:  Are you having pain: 5/10 anterior  hip  Best-worst: 4-7/10   Per eval -  Location/description: sharp and throbbing, L hip, anterior and lateral Best-worst over past week: 5-8/10 (takes a while to settle)  - aggravating factors: stairs, standing/walking (~10-35min, more limited by back), community navigation, sleeping on side, car transfer, laundry and other housework - Easing factors: icing, use of AD, recliner   PRECAUTIONS: None  WEIGHT BEARING RESTRICTIONS: No  FALLS:  Has patient fallen in last 6 months? No  LIVING ENVIRONMENT: Lives w/ daughter and 2 grandchildren 1 story home, 1STE Pt does housework, does require rest breaks due to back Has a walker she uses in mornings, uses cane when she is in the gym and in community   OCCUPATION: not working - enjoys walking in gym twice a week  PLOF: Independent  PATIENT GOALS: wants to try to exercise two days a week, wants to try to get stronger   NEXT MD VISIT: January 2025  OBJECTIVE:  Note: Objective measures were completed at Evaluation unless otherwise noted.  DIAGNOSTIC FINDINGS:  01/18/23 Lumbar XR, per results narrative: "XRs of the lumbar spine from 01/18/2023 were independently reviewed and  interpreted, showing laminectomy defect from L3-5. No evidence of  instability on flexion/extension views. Disc height loss with anterior  osteophyte formation at multiple levels. No fracture or dislocation seen. "  PATIENT SURVEYS:  FOTO 52 > 66  COGNITION: Overall cognitive status: Within functional limits for tasks assessed     SENSATION: LT intact, no sensory complaints   LOWER EXTREMITY ROM:     Active  Right eval Left eval  Hip flexion    Hip extension    Hip internal rotation    Hip external rotation    Knee extension    Knee flexion    (Blank rows = not tested) (Key: WFL = within functional limits not formally assessed, * = concordant pain, s = stiffness/stretching sensation, NT = not tested)  Comments: not formally measured but pt is  able to achieve ~45 deg of active L hip flexion in standing position  LOWER EXTREMITY MMT:    MMT Right eval Left eval  Hip flexion 4- 3- *  Hip abduction (modified sitting) 4+ 4+  Hip internal rotation 4+ 3- *  Hip external rotation 4+ 3- *  Knee flexion 5 4  Knee extension 5 5  Ankle dorsiflexion     (Blank rows = not tested) (Key: WFL = within functional limits not formally assessed, * = concordant pain, s = stiffness/stretching sensation, NT = not tested)  Comments:     FUNCTIONAL TESTS:  5xSTS: 13.49sec UE support standard chair   GAIT: Distance walked: within clinic Assistive device utilized: None Level of assistance: Complete Independence Comments: reduced gait speed/cadence, reduced stance time L hip, reduced clearance L LE   TODAY'S TREATMENT:   [facility] Adult PT Treatment:  DATE: 02/24/23 Therapeutic Exercise: L4 nu step LE only 5 min during subjective Seated triple extension LLE w/ fixed ball 2x8 cues for full extension and pacing Standing hip 3 way red band 2x5 BIL cues for posture and sequencing  4 inch step taps LLE only 2x8 w/ UE support 4 inch lateral step taps 2x5 cues for setup 10# STS from lowest mat 2x8 HEP update + handout/education   [facility] Adult PT Treatment:                                                DATE: 02/21/23 Therapeutic Exercise: Nu step LE only 4 min L2 STS from mat 2x15 no UE support cues for pacing and breath control Seated figure four LLE 2x30sec BIL cues for setup and breath control Standing hip flexor stretch LLE at counter 2x8 gently rocking, LUE support for balance  Standing LLE triple flexion x5 w/ LUE support at counter cues for posture and full ROM Standing LLE triple ext x8 LUE support cues for posture   Modalities: Moist heat L posterior hip at end of session, seated; 8 min no adverse events, good tolerance    [facility] Adult PT Treatment:                                                 DATE: 02-16-23 Therapeutic Exercise: Nustep UE/LE 5 minutes  Sit to stand 3 x 8 with 10 # KB Standing March with back of chair arm support  2 x 10 Standing Hip Abduction with RTB 3 x 10 Standing Hip Extension with RTB 3 x 10 Supine Piriformis Stretch with Foot on Ground   3 reps 15-30 sec hold Hip flexor stretch off of mat on left  60 sec x 1 Supine Bridge with Mini Swiss Ball Between Knees  3 x 10 Side-lying on Right for left hip clams with RTB 3 x10 Prone quad stretch with green strap  Manual Therapy: Left hip AP mobs Grade III-IV LAD on Left STW over Left Quad  [facility] Adult PT Treatment:                                                DATE: 02-14-23 Therapeutic Exercise: Side-lying on Right for left hip clams with RTB 3 x10 Sit to stand 2 x 8 Standing March with back of chair arm support  2 x 10 Standing Hip Abduction with RTB 3 x 10 Standing Hip Extension with RTB 3 x 10 Supine Piriformis Stretch with Foot on Ground   3 reps 15-30 sec hold Supine Bridge with Mini Swiss Ball Between Knees  3 x 10 Manual Therapy: STW over gluteals and piriformis Myofascial releases of Left quadratus lumborum LAD Left LE Trigger Point Dry-Needling performed     by Garen Lah Treatment instructions: Expect mild to moderate muscle soreness. S/S of pneumothorax if dry needled over a lung field, and to seek immediate medical attention should they occur. Patient verbalized understanding of these instructions and education.  Patient Consent Given: Yes Education handout provided: Previously provided Muscles treated:  L Quadratus lumborum, R  gluteals and piriformis Electrical stimulation performed: No Parameters: N/A Treatment response/outcome: twitch response noted, pt noted relief  Modalities: Moist hot pack                                                                                                                               PATIENT EDUCATION:  Education details: rationale for  interventions, HEP  Person educated: Patient Education method: Explanation, Demonstration, Tactile cues, Verbal cues Education comprehension: verbalized understanding, returned demonstration, verbal cues required, tactile cues required, and needs further education     HOME EXERCISE PROGRAM: Access Code: [email or web address] URL: [email or web address] Date: 02/24/2023 Prepared by: Fransisco Hertz  Program Notes - with hip flexor stretch, please perform holding on to counter, as done in clinic  Exercises - Sit to Stand with Armchair  - 2-3 x daily - 1 sets - 5 reps - Standing March with Counter Support  - 2-3 x daily - 1 sets - 5 reps - Supine Piriformis Stretch with Foot on Ground  - 2-3 x daily - 1 sets - 5 reps - 15-30 sec hold - Supine Bridge with Mini Swiss Ball Between Knees  - 2-3 x daily - 1 sets - 10 reps - Standing 3-Way Leg Reach with Resistance at Ankles and Counter Support  - 2-3 x daily - 1 sets - 5 reps - Standing Hip Flexor Stretch  - 2-3 x daily - 1 sets - 8 reps  ASSESSMENT:  CLINICAL IMPRESSION: 02/24/2023 Pt arrives w/ 5/10 pain, no issues after last session. Continuing to work on hip flexor strength against gravity and functional LE strengthening with increased time in closed chain. No adverse events, tolerates session well overall and denies any increase in pain on departure. Recommend continuing along current POC in order to address relevant deficits and improve functional tolerance. Pt departs today's session in no acute distress, all voiced questions/concerns addressed appropriately from PT perspective.     EVAL- Patient is a pleasant 57 y.o. woman who was seen today for physical therapy evaluation and treatment for L hip pain ongoing for several years but worsening over past month or so. She endorses weakness in hip and reduced tolerance to standing/walking. On exam she demonstrates reduced activity mobility of L hip and weakness of L hip flexors, rotational  musculature, and hamstring. 5xSTS around cutoff time for fall risk but does require UE support. Tolerates HEP and exam well without adverse event or increase in resting pain, increased time w/ education on appropriate performance at home and rationale for interventions. Recommend trial of skilled PT to address aforementioned deficits with aim of improving functional tolerance and reducing pain with typical activities. Pt departs today's session in no acute distress, all voiced concerns/questions addressed appropriately from PT perspective.    OBJECTIVE IMPAIRMENTS: Abnormal gait, decreased activity tolerance, decreased endurance, decreased mobility, difficulty walking, decreased ROM, decreased strength, impaired perceived functional ability, improper body mechanics, postural dysfunction, and pain.   ACTIVITY LIMITATIONS: carrying,  lifting, bending, sitting, standing, squatting, sleeping, stairs, transfers, and locomotion level  PARTICIPATION LIMITATIONS: meal prep, cleaning, laundry, shopping, and community activity  PERSONAL FACTORS: Age, Time since onset of injury/illness/exacerbation, and 3+ comorbidities: asthma, HTN, prior surgeries  are also affecting patient's functional outcome.   REHAB POTENTIAL: Good  CLINICAL DECISION MAKING: Stable/uncomplicated  EVALUATION COMPLEXITY: Low   GOALS: Goals reviewed with patient? Yes  SHORT TERM GOALS: Target date: 03/02/2023 Pt will demonstrate appropriate understanding and performance of initially prescribed HEP in order to facilitate improved independence with management of symptoms.  Baseline: HEP provided on eval Goal status: MET  2. Pt will score greater than or equal to 59 on FOTO in order to demonstrate improved perception of function due to symptoms.  Baseline: 52  Goal status: INITIAL    LONG TERM GOALS: Target date: 03/30/2023 Pt will score 66 or greater on FOTO in order to demonstrate improved perception of function due to symptoms.   Baseline: 52 Goal status: INITIAL  2.  Pt will demonstrate at least 4/5 L hip flexion MMT in order to facilitate improved LE strength for transfers/gait. Baseline: see MMT chart above Goal status: INITIAL  3.  Pt will report at least 50% decrease in overall pain levels in past week in order to facilitate improved tolerance to basic ADLs/mobility.   Baseline: 5-8/10  Goal status: INITIAL    4.  Pt will be able to perform 5xSTS in less than or equal to 10sec in order to demonstrate reduced fall risk and improved functional independence (MCID 5xSTS = 2.3 sec). Baseline: 13sec w/ UE support Goal status: INITIAL   5. Pt will report ability to perform typical household tasks such as cleaning/laundry with less than 3 pt increase in L hip pain on NPS in order to facilitate improved independence w/ daily tasks.  Baseline: increased pain w/ household tasks  Goal status: INITIAL  6. Pt will demonstrate appropriate performance of final prescribed HEP in order to facilitate improved self-management of symptoms post-discharge.   Baseline: initial HEP prescribed  Goal status: INITIAL     PLAN:  PT FREQUENCY: 2x/week  PT DURATION: 8 weeks  PLANNED INTERVENTIONS: [id number]- PT Re-evaluation, 97110-Therapeutic exercises, 97530- Therapeutic activity, [id number]- Neuromuscular re-education, 97535- Self Care, [id number]- Manual therapy, [phone]- Gait training, [phone]- Aquatic Therapy, Patient/Family education, Balance training, Stair training, Taping, Dry Needling, Joint mobilization, Spinal mobilization, Cryotherapy, and Moist heat  PLAN FOR NEXT SESSION: Review/update HEP PRN. Work on [employer] exercises as appropriate with emphasis on L hip strength and functional mechanics. Symptom modification strategies as indicated/appropriate. FOTO next session   Ashley Murrain PT, DPT 02/24/2023 11:43 AM

## 2023-02-24 ENCOUNTER — Encounter: Payer: Self-pay | Admitting: Physical Therapy

## 2023-02-24 ENCOUNTER — Ambulatory Visit: Payer: 59 | Admitting: Physical Therapy

## 2023-02-24 DIAGNOSIS — M25552 Pain in left hip: Secondary | ICD-10-CM

## 2023-02-24 DIAGNOSIS — R262 Difficulty in walking, not elsewhere classified: Secondary | ICD-10-CM

## 2023-02-24 DIAGNOSIS — M6281 Muscle weakness (generalized): Secondary | ICD-10-CM

## 2023-02-27 ENCOUNTER — Ambulatory Visit: Payer: 59 | Admitting: Physical Therapy

## 2023-02-27 ENCOUNTER — Encounter: Payer: Self-pay | Admitting: Physical Therapy

## 2023-02-27 DIAGNOSIS — M6281 Muscle weakness (generalized): Secondary | ICD-10-CM

## 2023-02-27 DIAGNOSIS — M25552 Pain in left hip: Secondary | ICD-10-CM

## 2023-02-27 NOTE — Therapy (Signed)
OUTPATIENT PHYSICAL THERAPY TREATMENT   Patient Name: JAYLN LIV MRN: [id number] DOB:10/04/1965, 57 y.o., female Today's Date: 02/27/2023  END OF SESSION:  PT End of Session - 02/27/23 1059     Visit Number 6    Number of Visits 17    Date for PT Re-Evaluation 03/30/23    Authorization Type [employer]/M[employer]    Authorization Time Period no auth required per eval appt notes    Progress Note Due on Visit 10    PT Start Time 1100    PT Stop Time 1140    PT Time Calculation (min) 40 min    Activity Tolerance Patient tolerated treatment well;No increased pain                  Past Medical History:  Diagnosis Date   Asthma    followed by pcp   Bronchitis    "chronic"   Hypertension    OA (osteoarthritis)    left knee, right shoulder, left hip   Pneumonia    "a couple years ago" per pt on 09/15/21   Seasonal allergies    Wears glasses    Past Surgical History:  Procedure Laterality Date   ANTERIOR HIP REVISION Left 06/06/2018   Procedure: LEFT HIP WOUND DEHISCENCE POSSIBLE HEAD AND LINER EXCHANGE;  Surgeon: Samson Frederic, MD;  Location: [facility];  Service: Orthopedics;  Laterality: Left;   COLONOSCOPY  2015   DIAGNOSTIC LAPAROSCOPY     tubal preg-took ovary and tube   LAPAROSCOPY FOR ECTOPIC PREGNANCY  1990s   LEFT SALPINGECTOMY AND RIGHT OOPHORECTOMY FOR ABNORMALITY   LUMBAR LAMINECTOMY N/A 09/20/2021   Procedure: L3-4 AND L4-5 CENTRAL LUMBAR LAMINECTOMIES;  Surgeon: Kerrin Champagne, MD;  Location: [facility];  Service: Orthopedics;  Laterality: N/A;   MASS EXCISION  02/15/2012   Procedure: EXCISION MASS;  Surgeon: Marlowe Shores, MD;  Location: [facility];  Service: Orthopedics;  Laterality: Right;  Excision of Right Small Volar Mass   MYELOGRAM     TONSILLECTOMY  age 30   TOTAL HIP ARTHROPLASTY Left 04/25/2018   Procedure: TOTAL HIP ARTHROPLASTY ANTERIOR APPROACH;  Surgeon: Samson Frederic, MD;  Location: [facility];  Service: Orthopedics;  Laterality:  Left;   TRIGGER FINGER RELEASE Right 2018   thumb   WISDOM TOOTH EXTRACTION  age 33   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 04/12/2022   Status post lumbar laminectomy 09/20/2021   Body mass index (BMI) 37.0-37.9, adult 03/14/2019   Lumbar stenosis without neurogenic claudication 03/14/2019   Hyperlipidemia 02/08/2019   Lumbar spondylosis 10/25/2018   Degeneration of lumbar intervertebral disc 10/01/2018   Surgical wound dehiscence 06/06/2018   Osteoarthritis of left hip 04/25/2018   Spinal stenosis of lumbar region 02/23/2018   Pain in left knee 01/10/2018   Trigger thumb of right hand 03/30/2017   Symptomatic mammary hypertrophy 11/28/2014   Dyspnea 03/26/2013   Asthma vs VCD  02/28/2013   Hypertensive disorder 06/07/2012   Pyogenic granuloma 02/15/2012    PCP: Rema Fendt, NP  REFERRING PROVIDER: London Sheer, MD  REFERRING DIAG: M70.62 (ICD-10-CM) - Trochanteric bursitis, left hip  THERAPY DIAG:  Pain in left hip  Muscle weakness (generalized)  Rationale for Evaluation and Treatment: Rehabilitation  ONSET DATE: acute on chronic, about a month ago  SUBJECTIVE:   SUBJECTIVE STATEMENT: 02/27/2023 Pt states she did a lot of walking after last session,  hip did well and she did use cane. No pain at present. No pain/soreness after last session. States she continues to feel she is progressing well.    EVAL- Pt endorses longstanding history of back and hip pain with surgeries (2023 and 2020 respectively). Reports worsening of hip pain over past month without change in activity or injury. States it does feel similar to pain from around time of surgery. Feels her hip is weak during car transfers and has reduced tolerance to standing/walking. Uses AD as needed. Denies N/T, no fevers/chills, no bowel/bladder issues.   PERTINENT HISTORY: asthma, HTN, prior lumbar laminectomy July 2023 PAIN:   Are you having pain: no pain at present  Best-worst: 4-7/10   Per eval -  Location/description: sharp and throbbing, L hip, anterior and lateral Best-worst over past week: 5-8/10 (takes a while to settle)  - aggravating factors: stairs, standing/walking (~10-83min, more limited by back), community navigation, sleeping on side, car transfer, laundry and other housework - Easing factors: icing, use of AD, recliner   PRECAUTIONS: None  WEIGHT BEARING RESTRICTIONS: No  FALLS:  Has patient fallen in last 6 months? No  LIVING ENVIRONMENT: Lives w/ daughter and 2 grandchildren 1 story home, 1STE Pt does housework, does require rest breaks due to back Has a walker she uses in mornings, uses cane when she is in the gym and in community   OCCUPATION: not working - enjoys walking in gym twice a week  PLOF: Independent  PATIENT GOALS: wants to try to exercise two days a week, wants to try to get stronger   NEXT MD VISIT: January 2025  OBJECTIVE:  Note: Objective measures were completed at Evaluation unless otherwise noted.  DIAGNOSTIC FINDINGS:  01/18/23 Lumbar XR, per results narrative: "XRs of the lumbar spine from 01/18/2023 were independently reviewed and  interpreted, showing laminectomy defect from L3-5. No evidence of  instability on flexion/extension views. Disc height loss with anterior  osteophyte formation at multiple levels. No fracture or dislocation seen. "  PATIENT SURVEYS:  FOTO 52 > 66 FOTO 02/27/23 67    COGNITION: Overall cognitive status: Within functional limits for tasks assessed     SENSATION: LT intact, no sensory complaints   LOWER EXTREMITY ROM:     Active  Right eval Left eval  Hip flexion    Hip extension    Hip internal rotation    Hip external rotation    Knee extension    Knee flexion    (Blank rows = not tested) (Key: WFL = within functional limits not formally assessed, * = concordant pain, s = stiffness/stretching sensation, NT =  not tested)  Comments: not formally measured but pt is able to achieve ~45 deg of active L hip flexion in standing position  LOWER EXTREMITY MMT:    MMT Right eval Left eval  Hip flexion 4- 3- *  Hip abduction (modified sitting) 4+ 4+  Hip internal rotation 4+ 3- *  Hip external rotation 4+ 3- *  Knee flexion 5 4  Knee extension 5 5  Ankle dorsiflexion     (Blank rows = not tested) (Key: WFL = within functional limits not formally assessed, * = concordant pain, s = stiffness/stretching sensation, NT = not tested)  Comments:     FUNCTIONAL TESTS:  5xSTS: 13.49sec UE support standard chair   GAIT: Distance walked: within clinic Assistive device utilized: None Level of assistance: Complete Independence Comments: reduced gait speed/cadence, reduced stance time L hip, reduced clearance L LE  TODAY'S TREATMENT:   [facility] Adult PT Treatment:                                                DATE: 02/27/23 Therapeutic Exercise: Nu step L5 LE only 5 min during subjective L hip flexor stretch lunge position PRN throughout session for symptom modification (4x4 in total) 6 inch step tap LLE 2x5 cues for posture/pacing Cone tap march 2x5 BIL at counter cues for control, upright posture Verbal HEP review/discussion, rationale for interventions  Therapeutic Activity: FOTO + education LLE lunge at counter 2x10 cues for mechanics, weight shift  Mini squat BW at counter 2x8 cues for trunk/knee posture and BOS   [facility] Adult PT Treatment:                                                DATE: 02/24/23 Therapeutic Exercise: L4 nu step LE only 5 min during subjective Seated triple extension LLE w/ fixed ball 2x8 cues for full extension and pacing Standing hip 3 way red band 2x5 BIL cues for posture and sequencing  4 inch step taps LLE only 2x8 w/ UE support 4 inch lateral step taps 2x5 cues for setup 10# STS from lowest mat 2x8 HEP update + handout/education   [facility] Adult PT Treatment:                                                 DATE: 02/21/23 Therapeutic Exercise: Nu step LE only 4 min L2 STS from mat 2x15 no UE support cues for pacing and breath control Seated figure four LLE 2x30sec BIL cues for setup and breath control Standing hip flexor stretch LLE at counter 2x8 gently rocking, LUE support for balance  Standing LLE triple flexion x5 w/ LUE support at counter cues for posture and full ROM Standing LLE triple ext x8 LUE support cues for posture   Modalities: Moist heat L posterior hip at end of session, seated; 8 min no adverse events, good tolerance                    PATIENT EDUCATION:  Education details: rationale for interventions, HEP  Person educated: Patient Education method: Explanation, Demonstration, Tactile cues, Verbal cues Education comprehension: verbalized understanding, returned demonstration, verbal cues required, tactile cues required, and needs further education     HOME EXERCISE PROGRAM: Access Code: [email or web address] URL: [email or web address] Date: 02/24/2023 Prepared by: Fransisco Hertz  Program Notes - with hip flexor stretch, please perform holding on to counter, as done in clinic  Exercises - Sit to Stand with Armchair  - 2-3 x daily - 1 sets - 5 reps - Standing March with Counter Support  - 2-3 x daily - 1 sets - 5 reps - Supine Piriformis Stretch with Foot on Ground  - 2-3 x daily - 1 sets - 5 reps - 15-30 sec hold - Supine Bridge with Mini Swiss Ball Between Knees  - 2-3 x daily - 1 sets - 10 reps - Standing 3-Way Leg Reach with Resistance at Ankles and Counter Support  - 2-3 x  daily - 1 sets - 5 reps - Standing Hip Flexor Stretch  - 2-3 x daily - 1 sets - 8 reps  ASSESSMENT:  CLINICAL IMPRESSION: 02/27/2023 Pt arrives w/o pain, no issues after last session. Continuing to work on hip strengthening within pt tolerance and using hip mobility work for symptom modification as needed. Progression for increased volume with hip  flexor work. No adverse events, tolerates session well overall, cues as above. Departs without pain, reports muscular fatigue. Recommend continuing along current POC in order to address relevant deficits and improve functional tolerance. Pt departs today's session in no acute distress, all voiced questions/concerns addressed appropriately from PT perspective.      EVAL- Patient is a pleasant 57 y.o. woman who was seen today for physical therapy evaluation and treatment for L hip pain ongoing for several years but worsening over past month or so. She endorses weakness in hip and reduced tolerance to standing/walking. On exam she demonstrates reduced activity mobility of L hip and weakness of L hip flexors, rotational musculature, and hamstring. 5xSTS around cutoff time for fall risk but does require UE support. Tolerates HEP and exam well without adverse event or increase in resting pain, increased time w/ education on appropriate performance at home and rationale for interventions. Recommend trial of skilled PT to address aforementioned deficits with aim of improving functional tolerance and reducing pain with typical activities. Pt departs today's session in no acute distress, all voiced concerns/questions addressed appropriately from PT perspective.    OBJECTIVE IMPAIRMENTS: Abnormal gait, decreased activity tolerance, decreased endurance, decreased mobility, difficulty walking, decreased ROM, decreased strength, impaired perceived functional ability, improper body mechanics, postural dysfunction, and pain.   ACTIVITY LIMITATIONS: carrying, lifting, bending, sitting, standing, squatting, sleeping, stairs, transfers, and locomotion level  PARTICIPATION LIMITATIONS: meal prep, cleaning, laundry, shopping, and community activity  PERSONAL FACTORS: Age, Time since onset of injury/illness/exacerbation, and 3+ comorbidities: asthma, HTN, prior surgeries  are also affecting patient's functional outcome.    REHAB POTENTIAL: Good  CLINICAL DECISION MAKING: Stable/uncomplicated  EVALUATION COMPLEXITY: Low   GOALS: Goals reviewed with patient? Yes  SHORT TERM GOALS: Target date: 03/02/2023 Pt will demonstrate appropriate understanding and performance of initially prescribed HEP in order to facilitate improved independence with management of symptoms.  Baseline: HEP provided on eval Goal status: MET  2. Pt will score greater than or equal to 59 on FOTO in order to demonstrate improved perception of function due to symptoms.  Baseline: 52  02/27/23: 67   Goal status: MET  LONG TERM GOALS: Target date: 03/30/2023 Pt will score 66 or greater on FOTO in order to demonstrate improved perception of function due to symptoms.  Baseline: 52 02/27/23: 67 Goal status: MET  2.  Pt will demonstrate at least 4/5 L hip flexion MMT in order to facilitate improved LE strength for transfers/gait. Baseline: see MMT chart above Goal status: INITIAL  3.  Pt will report at least 50% decrease in overall pain levels in past week in order to facilitate improved tolerance to basic ADLs/mobility.   Baseline: 5-8/10  Goal status: INITIAL    4.  Pt will be able to perform 5xSTS in less than or equal to 10sec in order to demonstrate reduced fall risk and improved functional independence ([facility] 5xSTS = 2.3 sec). Baseline: 13sec w/ UE support Goal status: INITIAL   5. Pt will report ability to perform typical household tasks such as cleaning/laundry with less than 3 pt increase in L hip pain on NPS  in order to facilitate improved independence w/ daily tasks.  Baseline: increased pain w/ household tasks  Goal status: INITIAL  6. Pt will demonstrate appropriate performance of final prescribed HEP in order to facilitate improved self-management of symptoms post-discharge.   Baseline: initial HEP prescribed  Goal status: INITIAL     PLAN:  PT FREQUENCY: 2x/week  PT DURATION: 8 weeks  PLANNED INTERVENTIONS:  [id number]- PT Re-evaluation, 97110-Therapeutic exercises, 97530- Therapeutic activity, [id number]- Neuromuscular re-education, 97535- Self Care, 20254- Manual therapy, [phone]- Gait training, [phone]- Aquatic Therapy, Patient/Family education, Balance training, Stair training, Taping, Dry Needling, Joint mobilization, Spinal mobilization, Cryotherapy, and Moist heat  PLAN FOR NEXT SESSION: Review/update HEP PRN. Work on [employer] exercises as appropriate with emphasis on L hip strength and functional mechanics. Symptom modification strategies as indicated/appropriate.   Ashley Murrain PT, DPT 02/27/2023 11:44 AM

## 2023-03-02 NOTE — Therapy (Signed)
 OUTPATIENT PHYSICAL THERAPY TREATMENT   Patient Name: Darlene Crosby MRN: [id number] DOB:April 05, 1965, 58 y.o., female Today's Date: 03/03/2023  END OF SESSION:  PT End of Session - 03/03/23 1059     Visit Number 7    Number of Visits 17    Date for PT Re-Evaluation 03/30/23    Authorization Type [employer]/M[employer]    Authorization Time Period no auth required per eval appt notes    Progress Note Due on Visit 10    PT Start Time 1100    PT Stop Time 1145    PT Time Calculation (min) 45 min    Activity Tolerance Patient tolerated treatment well;No increased pain                   Past Medical History:  Diagnosis Date   Asthma    followed by pcp   Bronchitis    chronic   Hypertension    OA (osteoarthritis)    left knee, right shoulder, left hip   Pneumonia    a couple years ago per pt on 09/15/21   Seasonal allergies    Wears glasses    Past Surgical History:  Procedure Laterality Date   ANTERIOR HIP REVISION Left 06/06/2018   Procedure: LEFT HIP WOUND DEHISCENCE POSSIBLE HEAD AND LINER EXCHANGE;  Surgeon: Fidel Rogue, MD;  Location: [facility];  Service: Orthopedics;  Laterality: Left;   COLONOSCOPY  2015   DIAGNOSTIC LAPAROSCOPY     tubal preg-took ovary and tube   LAPAROSCOPY FOR ECTOPIC PREGNANCY  1990s   LEFT SALPINGECTOMY AND RIGHT OOPHORECTOMY FOR ABNORMALITY   LUMBAR LAMINECTOMY N/A 09/20/2021   Procedure: L3-4 AND L4-5 CENTRAL LUMBAR LAMINECTOMIES;  Surgeon: Lucilla Lynwood BRAVO, MD;  Location: [facility];  Service: Orthopedics;  Laterality: N/A;   MASS EXCISION  02/15/2012   Procedure: EXCISION MASS;  Surgeon: Donnice DELENA Robinsons, MD;  Location: [facility];  Service: Orthopedics;  Laterality: Right;  Excision of Right Small Volar Mass   MYELOGRAM     TONSILLECTOMY  age 9   TOTAL HIP ARTHROPLASTY Left 04/25/2018   Procedure: TOTAL HIP ARTHROPLASTY ANTERIOR APPROACH;  Surgeon: Fidel Rogue, MD;  Location: [facility];  Service: Orthopedics;  Laterality:  Left;   TRIGGER FINGER RELEASE Right 2018   thumb   WISDOM TOOTH EXTRACTION  age 84   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 04/12/2022   Status post lumbar laminectomy 09/20/2021   Body mass index (BMI) 37.0-37.9, adult 03/14/2019   Lumbar stenosis without neurogenic claudication 03/14/2019   Hyperlipidemia 02/08/2019   Lumbar spondylosis 10/25/2018   Degeneration of lumbar intervertebral disc 10/01/2018   Surgical wound dehiscence 06/06/2018   Osteoarthritis of left hip 04/25/2018   Spinal stenosis of lumbar region 02/23/2018   Pain in left knee 01/10/2018   Trigger thumb of right hand 03/30/2017   Symptomatic mammary hypertrophy 11/28/2014   Dyspnea 03/26/2013   Asthma vs VCD  02/28/2013   Hypertensive disorder 06/07/2012   Pyogenic granuloma 02/15/2012    PCP: Lorren Greig PARAS, NP  REFERRING PROVIDER: Georgina Ozell DELENA, MD  REFERRING DIAG: M70.62 (ICD-10-CM) - Trochanteric bursitis, left hip  THERAPY DIAG:  Pain in left hip  Muscle weakness (generalized)  Difficulty in walking, not elsewhere classified  Rationale for Evaluation and Treatment: Rehabilitation  ONSET DATE: acute on chronic, about a month ago  SUBJECTIVE:   SUBJECTIVE STATEMENT: 03/03/2023 Pt states she  has been busy this morning, 4/10 pain at present, not too bad. Felt good after last session, no soreness but states she did take an epsom salt bath. HEP going well. Feels she is getting stronger. No other new updates  EVAL- Pt endorses longstanding history of back and hip pain with surgeries (2023 and 2020 respectively). Reports worsening of hip pain over past month without change in activity or injury. States it does feel similar to pain from around time of surgery. Feels her hip is weak during car transfers and has reduced tolerance to standing/walking. Uses AD as needed. Denies N/T, no fevers/chills, no bowel/bladder issues.    PERTINENT HISTORY: asthma, HTN, prior lumbar laminectomy July 2023 PAIN:  Are you having pain: 4/10 Best-worst: 4-7/10   Per eval -  Location/description: sharp and throbbing, L hip, anterior and lateral Best-worst over past week: 5-8/10 (takes a while to settle)  - aggravating factors: stairs, standing/walking (~10-70min, more limited by back), community navigation, sleeping on side, car transfer, laundry and other housework - Easing factors: icing, use of AD, recliner   PRECAUTIONS: None  WEIGHT BEARING RESTRICTIONS: No  FALLS:  Has patient fallen in last 6 months? No  LIVING ENVIRONMENT: Lives w/ daughter and 2 grandchildren 1 story home, 1STE Pt does housework, does require rest breaks due to back Has a walker she uses in mornings, uses cane when she is in the gym and in community   OCCUPATION: not working - enjoys walking in gym twice a week  PLOF: Independent  PATIENT GOALS: wants to try to exercise two days a week, wants to try to get stronger   NEXT MD VISIT: January 2025  OBJECTIVE:  Note: Objective measures were completed at Evaluation unless otherwise noted.  DIAGNOSTIC FINDINGS:  01/18/23 Lumbar XR, per results narrative: XRs of the lumbar spine from 01/18/2023 were independently reviewed and  interpreted, showing laminectomy defect from L3-5. No evidence of  instability on flexion/extension views. Disc height loss with anterior  osteophyte formation at multiple levels. No fracture or dislocation seen.   PATIENT SURVEYS:  FOTO 52 > 66 FOTO 02/27/23 67    COGNITION: Overall cognitive status: Within functional limits for tasks assessed     SENSATION: LT intact, no sensory complaints   LOWER EXTREMITY ROM:     Active  Right eval Left eval  Hip flexion    Hip extension    Hip internal rotation    Hip external rotation    Knee extension    Knee flexion    (Blank rows = not tested) (Key: WFL = within functional limits not formally  assessed, * = concordant pain, s = stiffness/stretching sensation, NT = not tested)  Comments: not formally measured but pt is able to achieve ~45 deg of active L hip flexion in standing position  LOWER EXTREMITY MMT:    MMT Right eval Left eval  Hip flexion 4- 3- *  Hip abduction (modified sitting) 4+ 4+  Hip internal rotation 4+ 3- *  Hip external rotation 4+ 3- *  Knee flexion 5 4  Knee extension 5 5  Ankle dorsiflexion     (Blank rows = not tested) (Key: WFL = within functional limits not formally assessed, * = concordant pain, s = stiffness/stretching sensation, NT = not tested)  Comments:     FUNCTIONAL TESTS:  5xSTS: 13.49sec UE support standard chair   GAIT: Distance walked: within clinic Assistive device utilized: None Level of assistance: Complete Independence Comments: reduced gait speed/cadence, reduced  stance time L hip, reduced clearance L LE   TODAY'S TREATMENT:   [facility] Adult PT Treatment:                                                DATE: 03/03/23 Therapeutic Exercise: Nu step during subjective LE only L5 4 inch lateral step down LLE only 2x8 cues for form and control  4 inch step LLE lift off for end range hip flex ROM 2x5 cues for posture and form (superset with hip flexor stretch below) Fwd lunge + L hip flexor stretch 2x8 cues for comfortable ROM (superset w/ LE lift off as above) 10# STS x10, 15# x5 cues for pacing Lunge at counter x6 LLE HEP handout + education/update    [facility] Adult PT Treatment:                                                DATE: 02/27/23 Therapeutic Exercise: Nu step L5 LE only 5 min during subjective L hip flexor stretch lunge position PRN throughout session for symptom modification (4x4 in total) 6 inch step tap LLE 2x5 cues for posture/pacing Cone tap march 2x5 BIL at counter cues for control, upright posture Verbal HEP review/discussion, rationale for interventions  Therapeutic Activity: FOTO + education LLE lunge  at counter 2x10 cues for mechanics, weight shift  Mini squat BW at counter 2x8 cues for trunk/knee posture and BOS   [facility] Adult PT Treatment:                                                DATE: 02/24/23 Therapeutic Exercise: L4 nu step LE only 5 min during subjective Seated triple extension LLE w/ fixed ball 2x8 cues for full extension and pacing Standing hip 3 way red band 2x5 BIL cues for posture and sequencing  4 inch step taps LLE only 2x8 w/ UE support 4 inch lateral step taps 2x5 cues for setup 10# STS from lowest mat 2x8 HEP update + handout/education              PATIENT EDUCATION:  Education details: rationale for interventions, HEP  Person educated: Patient Education method: Explanation, Demonstration, Tactile cues, Verbal cues Education comprehension: verbalized understanding, returned demonstration, verbal cues required, tactile cues required, and needs further education     HOME EXERCISE PROGRAM: Access Code: [email or web address] URL: [email or web address] Date: 03/03/2023 Prepared by: Alm Jenny  Program Notes - with hip flexor stretch, please perform holding on to counter, as done in clinic  Exercises - Sit to Stand with Armchair  - 2-3 x daily - 1 sets - 5 reps - Standing March with Counter Support  - 2-3 x daily - 1 sets - 5 reps - Supine Piriformis Stretch with Foot on Ground  - 2-3 x daily - 1 sets - 5 reps - 15-30 sec hold - Supine Bridge with Mini Swiss Ball Between Knees  - 2-3 x daily - 1 sets - 10 reps - Standing 3-Way Leg Reach with Resistance at Ankles and Counter Support  - 2-3 x daily - 1 sets - 5  reps - Standing Hip Flexor Stretch  - 2-3 x daily - 1 sets - 8 reps - Forward Lunge with Back Leg Straight and Counter Support  - 2-3 x daily - 1 sets - 6 reps  ASSESSMENT:  CLINICAL IMPRESSION: 03/03/2023 Pt arrives w/ 4/10 pain on NPS, no issues after last session. Continuing to work on closed chain hip strengthening and hip flexor active mobility  at end of available ROM. No adverse events, pt tolerates session well with no increases in resting pain. Does continue to have difficulty with active hip flexion. Recommend continuing along current POC in order to address relevant deficits and improve functional tolerance. Pt departs today's session in no acute distress, all voiced questions/concerns addressed appropriately from PT perspective.     EVAL- Patient is a pleasant 58 y.o. woman who was seen today for physical therapy evaluation and treatment for L hip pain ongoing for several years but worsening over past month or so. She endorses weakness in hip and reduced tolerance to standing/walking. On exam she demonstrates reduced activity mobility of L hip and weakness of L hip flexors, rotational musculature, and hamstring. 5xSTS around cutoff time for fall risk but does require UE support. Tolerates HEP and exam well without adverse event or increase in resting pain, increased time w/ education on appropriate performance at home and rationale for interventions. Recommend trial of skilled PT to address aforementioned deficits with aim of improving functional tolerance and reducing pain with typical activities. Pt departs today's session in no acute distress, all voiced concerns/questions addressed appropriately from PT perspective.    OBJECTIVE IMPAIRMENTS: Abnormal gait, decreased activity tolerance, decreased endurance, decreased mobility, difficulty walking, decreased ROM, decreased strength, impaired perceived functional ability, improper body mechanics, postural dysfunction, and pain.   ACTIVITY LIMITATIONS: carrying, lifting, bending, sitting, standing, squatting, sleeping, stairs, transfers, and locomotion level  PARTICIPATION LIMITATIONS: meal prep, cleaning, laundry, shopping, and community activity  PERSONAL FACTORS: Age, Time since onset of injury/illness/exacerbation, and 3+ comorbidities: asthma, HTN, prior surgeries  are also affecting  patient's functional outcome.   REHAB POTENTIAL: Good  CLINICAL DECISION MAKING: Stable/uncomplicated  EVALUATION COMPLEXITY: Low   GOALS: Goals reviewed with patient? Yes  SHORT TERM GOALS: Target date: 03/02/2023 Pt will demonstrate appropriate understanding and performance of initially prescribed HEP in order to facilitate improved independence with management of symptoms.  Baseline: HEP provided on eval Goal status: MET  2. Pt will score greater than or equal to 59 on FOTO in order to demonstrate improved perception of function due to symptoms.  Baseline: 52  02/27/23: 67   Goal status: MET  LONG TERM GOALS: Target date: 03/30/2023 Pt will score 66 or greater on FOTO in order to demonstrate improved perception of function due to symptoms.  Baseline: 52 02/27/23: 67 Goal status: MET  2.  Pt will demonstrate at least 4/5 L hip flexion MMT in order to facilitate improved LE strength for transfers/gait. Baseline: see MMT chart above Goal status: INITIAL  3.  Pt will report at least 50% decrease in overall pain levels in past week in order to facilitate improved tolerance to basic ADLs/mobility.   Baseline: 5-8/10  Goal status: INITIAL    4.  Pt will be able to perform 5xSTS in less than or equal to 10sec in order to demonstrate reduced fall risk and improved functional independence ([facility] 5xSTS = 2.3 sec). Baseline: 13sec w/ UE support Goal status: INITIAL   5. Pt will report ability to perform typical household tasks such  as cleaning/laundry with less than 3 pt increase in L hip pain on NPS in order to facilitate improved independence w/ daily tasks.  Baseline: increased pain w/ household tasks  Goal status: INITIAL  6. Pt will demonstrate appropriate performance of final prescribed HEP in order to facilitate improved self-management of symptoms post-discharge.   Baseline: initial HEP prescribed  Goal status: INITIAL     PLAN:  PT FREQUENCY: 2x/week  PT DURATION: 8  weeks  PLANNED INTERVENTIONS: [id number]- PT Re-evaluation, 97110-Therapeutic exercises, 97530- Therapeutic activity, [id number]- Neuromuscular re-education, 97535- Self Care, [id number]- Manual therapy, [phone]- Gait training, [phone]- Aquatic Therapy, Patient/Family education, Balance training, Stair training, Taping, Dry Needling, Joint mobilization, Spinal mobilization, Cryotherapy, and Moist heat  PLAN FOR NEXT SESSION: Review/update HEP PRN. Work on [employer] exercises as appropriate with emphasis on L hip strength and functional mechanics. Symptom modification strategies as indicated/appropriate.   Alm DELENA Jenny PT, DPT 03/03/2023 1:05 PM

## 2023-03-03 ENCOUNTER — Encounter: Payer: Self-pay | Admitting: Physical Therapy

## 2023-03-03 ENCOUNTER — Ambulatory Visit: Payer: 59 | Attending: Orthopedic Surgery | Admitting: Physical Therapy

## 2023-03-03 DIAGNOSIS — M25552 Pain in left hip: Secondary | ICD-10-CM | POA: Insufficient documentation

## 2023-03-03 DIAGNOSIS — M6281 Muscle weakness (generalized): Secondary | ICD-10-CM | POA: Insufficient documentation

## 2023-03-03 DIAGNOSIS — R262 Difficulty in walking, not elsewhere classified: Secondary | ICD-10-CM | POA: Insufficient documentation

## 2023-03-07 ENCOUNTER — Encounter: Payer: Self-pay | Admitting: Physical Therapy

## 2023-03-07 ENCOUNTER — Encounter: Payer: 59 | Admitting: Physical Therapy

## 2023-03-07 ENCOUNTER — Ambulatory Visit: Payer: 59 | Admitting: Physical Therapy

## 2023-03-07 DIAGNOSIS — R262 Difficulty in walking, not elsewhere classified: Secondary | ICD-10-CM

## 2023-03-07 DIAGNOSIS — M6281 Muscle weakness (generalized): Secondary | ICD-10-CM

## 2023-03-07 DIAGNOSIS — M25552 Pain in left hip: Secondary | ICD-10-CM

## 2023-03-07 NOTE — Therapy (Signed)
 OUTPATIENT PHYSICAL THERAPY TREATMENT   Patient Name: Darlene Crosby MRN: [id number] DOB:1965/10/02, 58 y.o., female Today's Date: 03/07/2023  END OF SESSION:  PT End of Session - 03/07/23 1417     Visit Number 8    Number of Visits 17    Date for PT Re-Evaluation 03/30/23    Authorization Type [employer]/M[employer]    Authorization Time Period no auth required per eval appt notes    Progress Note Due on Visit 10    PT Start Time 1416    PT Stop Time 1503    PT Time Calculation (min) 47 min    Activity Tolerance Patient tolerated treatment well;No increased pain    Behavior During Therapy WFL for tasks assessed/performed                    Past Medical History:  Diagnosis Date   Asthma    followed by pcp   Bronchitis    chronic   Hypertension    OA (osteoarthritis)    left knee, right shoulder, left hip   Pneumonia    a couple years ago per pt on 09/15/21   Seasonal allergies    Wears glasses    Past Surgical History:  Procedure Laterality Date   ANTERIOR HIP REVISION Left 06/06/2018   Procedure: LEFT HIP WOUND DEHISCENCE POSSIBLE HEAD AND LINER EXCHANGE;  Surgeon: Fidel Rogue, MD;  Location: [facility];  Service: Orthopedics;  Laterality: Left;   COLONOSCOPY  2015   DIAGNOSTIC LAPAROSCOPY     tubal preg-took ovary and tube   LAPAROSCOPY FOR ECTOPIC PREGNANCY  1990s   LEFT SALPINGECTOMY AND RIGHT OOPHORECTOMY FOR ABNORMALITY   LUMBAR LAMINECTOMY N/A 09/20/2021   Procedure: L3-4 AND L4-5 CENTRAL LUMBAR LAMINECTOMIES;  Surgeon: Lucilla Lynwood BRAVO, MD;  Location: [facility];  Service: Orthopedics;  Laterality: N/A;   MASS EXCISION  02/15/2012   Procedure: EXCISION MASS;  Surgeon: Donnice DELENA Robinsons, MD;  Location: [facility];  Service: Orthopedics;  Laterality: Right;  Excision of Right Small Volar Mass   MYELOGRAM     TONSILLECTOMY  age 33   TOTAL HIP ARTHROPLASTY Left 04/25/2018   Procedure: TOTAL HIP ARTHROPLASTY ANTERIOR APPROACH;  Surgeon: Fidel Rogue, MD;  Location: [facility];  Service: Orthopedics;  Laterality: Left;   TRIGGER FINGER RELEASE Right 2018   thumb   WISDOM TOOTH EXTRACTION  age 24   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 04/12/2022   Status post lumbar laminectomy 09/20/2021   Body mass index (BMI) 37.0-37.9, adult 03/14/2019   Lumbar stenosis without neurogenic claudication 03/14/2019   Hyperlipidemia 02/08/2019   Lumbar spondylosis 10/25/2018   Degeneration of lumbar intervertebral disc 10/01/2018   Surgical wound dehiscence 06/06/2018   Osteoarthritis of left hip 04/25/2018   Spinal stenosis of lumbar region 02/23/2018   Pain in left knee 01/10/2018   Trigger thumb of right hand 03/30/2017   Symptomatic mammary hypertrophy 11/28/2014   Dyspnea 03/26/2013   Asthma vs VCD  02/28/2013   Hypertensive disorder 06/07/2012   Pyogenic granuloma 02/15/2012    PCP: Lorren Greig PARAS, NP  REFERRING PROVIDER: Georgina Ozell DELENA, MD  REFERRING DIAG: M70.62 (ICD-10-CM) - Trochanteric bursitis, left hip  THERAPY DIAG:  Pain in left hip  Muscle weakness (generalized)  Difficulty in walking, not elsewhere classified  Rationale for Evaluation and Treatment: Rehabilitation  ONSET DATE: acute on chronic, about a month  ago  SUBJECTIVE:   SUBJECTIVE STATEMENT: 03/07/2023 I have doing alittle better, but the cold today has me achey.  EVAL- Pt endorses longstanding history of back and hip pain with surgeries (2023 and 2020 respectively). Reports worsening of hip pain over past month without change in activity or injury. States it does feel similar to pain from around time of surgery. Feels her hip is weak during car transfers and has reduced tolerance to standing/walking. Uses AD as needed. Denies N/T, no fevers/chills, no bowel/bladder issues.   PERTINENT HISTORY: asthma, HTN, prior lumbar laminectomy July 2023 PAIN:  Are you having pain:  5/10  Location/description: sharp and throbbing, L hip, anterior and lateral Best-worst over past week: 5-8/10 (takes a while to settle)  - aggravating factors: stairs, standing/walking (~10-20min, more limited by back), community navigation, sleeping on side, car transfer, laundry and other housework - Easing factors: icing, use of AD, recliner   PRECAUTIONS: None  WEIGHT BEARING RESTRICTIONS: No  FALLS:  Has patient fallen in last 6 months? No  LIVING ENVIRONMENT: Lives w/ daughter and 2 grandchildren 1 story home, 1STE Pt does housework, does require rest breaks due to back Has a walker she uses in mornings, uses cane when she is in the gym and in community   OCCUPATION: not working - enjoys walking in gym twice a week  PLOF: Independent  PATIENT GOALS: wants to try to exercise two days a week, wants to try to get stronger   NEXT MD VISIT: January 2025  OBJECTIVE:  Note: Objective measures were completed at Evaluation unless otherwise noted.  DIAGNOSTIC FINDINGS:  01/18/23 Lumbar XR, per results narrative: XRs of the lumbar spine from 01/18/2023 were independently reviewed and  interpreted, showing laminectomy defect from L3-5. No evidence of  instability on flexion/extension views. Disc height loss with anterior  osteophyte formation at multiple levels. No fracture or dislocation seen.   PATIENT SURVEYS:  FOTO 52 > 66 FOTO 02/27/23 67    COGNITION: Overall cognitive status: Within functional limits for tasks assessed     SENSATION: LT intact, no sensory complaints   LOWER EXTREMITY ROM:     Active  Right eval Left eval  Hip flexion    Hip extension    Hip internal rotation    Hip external rotation    Knee extension    Knee flexion    (Blank rows = not tested) (Key: WFL = within functional limits not formally assessed, * = concordant pain, s = stiffness/stretching sensation, NT = not tested)  Comments: not formally measured but pt is able to achieve  ~45 deg of active L hip flexion in standing position  LOWER EXTREMITY MMT:    MMT Right eval Left eval  Hip flexion 4- 3- *  Hip abduction (modified sitting) 4+ 4+  Hip internal rotation 4+ 3- *  Hip external rotation 4+ 3- *  Knee flexion 5 4  Knee extension 5 5  Ankle dorsiflexion     (Blank rows = not tested) (Key: WFL = within functional limits not formally assessed, * = concordant pain, s = stiffness/stretching sensation, NT = not tested)  Comments:     FUNCTIONAL TESTS:  5xSTS: 13.49sec UE support standard chair   GAIT: Distance walked: within clinic Assistive device utilized: None Level of assistance: Complete Independence Comments: reduced gait speed/cadence, reduced stance time L hip, reduced clearance L LE   TREATMENT: [facility] Adult PT Treatment:  DATE: 03/07/2023 Therapeutic Exercise: Standing L Glute med stretch 2 x 30 sec  Nu-step L6 x 5:30 LE only Standing hip abduction/ extension 2 x 12 bil with HHA on free motion Sit to stand 2 x 10 with 3 sec eccentric loading, lowered table 1 inch between seats  Updated HEP to include glute med / IT band stretch  Manual Therapy: MTPR along the L glute med in R sidleying - taught how to perform at home with tennis ball.      [facility] Adult PT Treatment:                                                DATE: 03/03/23 Therapeutic Exercise: Nu step during subjective LE only L5 4 inch lateral step down LLE only 2x8 cues for form and control  4 inch step LLE lift off for end range hip flex ROM 2x5 cues for posture and form (superset with hip flexor stretch below) Fwd lunge + L hip flexor stretch 2x8 cues for comfortable ROM (superset w/ LE lift off as above) 10# STS x10, 15# x5 cues for pacing Lunge at counter x6 LLE HEP handout + education/update    [facility] Adult PT Treatment:                                                DATE: 02/27/23 Therapeutic Exercise: Nu step L5 LE only 5  min during subjective L hip flexor stretch lunge position PRN throughout session for symptom modification (4x4 in total) 6 inch step tap LLE 2x5 cues for posture/pacing Cone tap march 2x5 BIL at counter cues for control, upright posture Verbal HEP review/discussion, rationale for interventions  Therapeutic Activity: FOTO + education LLE lunge at counter 2x10 cues for mechanics, weight shift  Mini squat BW at counter 2x8 cues for trunk/knee posture and BOS   [facility] Adult PT Treatment:                                                DATE: 02/24/23 Therapeutic Exercise: L4 nu step LE only 5 min during subjective Seated triple extension LLE w/ fixed ball 2x8 cues for full extension and pacing Standing hip 3 way red band 2x5 BIL cues for posture and sequencing  4 inch step taps LLE only 2x8 w/ UE support 4 inch lateral step taps 2x5 cues for setup 10# STS from lowest mat 2x8 HEP update + handout/education              PATIENT EDUCATION:  Education details: rationale for interventions, HEP  Person educated: Patient Education method: Explanation, Demonstration, Tactile cues, Verbal cues Education comprehension: verbalized understanding, returned demonstration, verbal cues required, tactile cues required, and needs further education     HOME EXERCISE PROGRAM: Access Code: [email or web address] URL: [email or web address] Date: 03/07/2023 Prepared by: Joneen Fresh  Program Notes - with hip flexor stretch, please perform holding on to counter, as done in clinic  Exercises - Sit to Stand with Armchair  - 2-3 x daily - 1 sets - 5 reps - Standing March with Counter Support  -  2-3 x daily - 1 sets - 5 reps - Supine Piriformis Stretch with Foot on Ground  - 2-3 x daily - 1 sets - 5 reps - 15-30 sec hold - Supine Bridge with Mini Swiss Ball Between Knees  - 2-3 x daily - 1 sets - 10 reps - Standing 3-Way Leg Reach with Resistance at Ankles and Counter Support  - 2-3 x daily - 1 sets - 5  reps - Standing Hip Flexor Stretch  - 2-3 x daily - 1 sets - 8 reps - Forward Lunge with Back Leg Straight and Counter Support  - 2-3 x daily - 1 sets - 6 reps - ITB Stretch at Wall  - 1 x daily - 7 x weekly - 2 sets - 10 reps  ASSESSMENT:  CLINICAL IMPRESSION: 03/07/2023 Pt arrives to PT today noting pain is 5/10 due to the cold weather outsides. Pt opted to hold of on TPDN today so focused on MTPR and taught how it can be performed at home. Continued working on hip abductor/ extensor strength which she did well but does fatigue with sit to stand especially with going to a lower surface with controlled eccentric lowering. End of session she noted pain dropped from a 5 to a 4/10.    EVAL- Patient is a pleasant 58 y.o. woman who was seen today for physical therapy evaluation and treatment for L hip pain ongoing for several years but worsening over past month or so. She endorses weakness in hip and reduced tolerance to standing/walking. On exam she demonstrates reduced activity mobility of L hip and weakness of L hip flexors, rotational musculature, and hamstring. 5xSTS around cutoff time for fall risk but does require UE support. Tolerates HEP and exam well without adverse event or increase in resting pain, increased time w/ education on appropriate performance at home and rationale for interventions. Recommend trial of skilled PT to address aforementioned deficits with aim of improving functional tolerance and reducing pain with typical activities. Pt departs today's session in no acute distress, all voiced concerns/questions addressed appropriately from PT perspective.    OBJECTIVE IMPAIRMENTS: Abnormal gait, decreased activity tolerance, decreased endurance, decreased mobility, difficulty walking, decreased ROM, decreased strength, impaired perceived functional ability, improper body mechanics, postural dysfunction, and pain.   ACTIVITY LIMITATIONS: carrying, lifting, bending, sitting, standing,  squatting, sleeping, stairs, transfers, and locomotion level  PARTICIPATION LIMITATIONS: meal prep, cleaning, laundry, shopping, and community activity  PERSONAL FACTORS: Age, Time since onset of injury/illness/exacerbation, and 3+ comorbidities: asthma, HTN, prior surgeries  are also affecting patient's functional outcome.   REHAB POTENTIAL: Good  CLINICAL DECISION MAKING: Stable/uncomplicated  EVALUATION COMPLEXITY: Low   GOALS: Goals reviewed with patient? Yes  SHORT TERM GOALS: Target date: 03/02/2023 Pt will demonstrate appropriate understanding and performance of initially prescribed HEP in order to facilitate improved independence with management of symptoms.  Baseline: HEP provided on eval Goal status: MET  2. Pt will score greater than or equal to 59 on FOTO in order to demonstrate improved perception of function due to symptoms.  Baseline: 52  02/27/23: 67   Goal status: MET  LONG TERM GOALS: Target date: 03/30/2023 Pt will score 66 or greater on FOTO in order to demonstrate improved perception of function due to symptoms.  Baseline: 52 02/27/23: 67 Goal status: MET  2.  Pt will demonstrate at least 4/5 L hip flexion MMT in order to facilitate improved LE strength for transfers/gait. Baseline: see MMT chart above Goal status: INITIAL  3.  Pt will report at least 50% decrease in overall pain levels in past week in order to facilitate improved tolerance to basic ADLs/mobility.   Baseline: 5-8/10  Goal status: INITIAL    4.  Pt will be able to perform 5xSTS in less than or equal to 10sec in order to demonstrate reduced fall risk and improved functional independence ([facility] 5xSTS = 2.3 sec). Baseline: 13sec w/ UE support Goal status: INITIAL   5. Pt will report ability to perform typical household tasks such as cleaning/laundry with less than 3 pt increase in L hip pain on NPS in order to facilitate improved independence w/ daily tasks.  Baseline: increased pain w/  household tasks  Goal status: INITIAL  6. Pt will demonstrate appropriate performance of final prescribed HEP in order to facilitate improved self-management of symptoms post-discharge.   Baseline: initial HEP prescribed  Goal status: INITIAL     PLAN:  PT FREQUENCY: 2x/week  PT DURATION: 8 weeks  PLANNED INTERVENTIONS: [id number]- PT Re-evaluation, 97110-Therapeutic exercises, 97530- Therapeutic activity, [id number]- Neuromuscular re-education, 97535- Self Care, [id number]- Manual therapy, [phone]- Gait training, [phone]- Aquatic Therapy, Patient/Family education, Balance training, Stair training, Taping, Dry Needling, Joint mobilization, Spinal mobilization, Cryotherapy, and Moist heat  PLAN FOR NEXT SESSION: Review/update HEP PRN. Work on [employer] exercises as appropriate with emphasis on L hip strength and functional mechanics. Symptom modification strategies as indicated/appropriate.   Laylaa Guevarra PT, DPT, LAT, ATC  03/07/23  3:08 PM

## 2023-03-09 ENCOUNTER — Encounter: Payer: Self-pay | Admitting: Physical Therapy

## 2023-03-09 ENCOUNTER — Ambulatory Visit: Payer: 59 | Admitting: Physical Therapy

## 2023-03-09 DIAGNOSIS — R262 Difficulty in walking, not elsewhere classified: Secondary | ICD-10-CM

## 2023-03-09 DIAGNOSIS — M6281 Muscle weakness (generalized): Secondary | ICD-10-CM

## 2023-03-09 DIAGNOSIS — M25552 Pain in left hip: Secondary | ICD-10-CM | POA: Diagnosis not present

## 2023-03-09 NOTE — Therapy (Signed)
 OUTPATIENT PHYSICAL THERAPY TREATMENT   Patient Name: Darlene Crosby MRN: [id number] DOB:1965-10-28, 58 y.o., female Today's Date: 03/09/2023  END OF SESSION:  PT End of Session - 03/09/23 1104     Visit Number 9    Number of Visits 17    Date for PT Re-Evaluation 03/30/23    Authorization Type [employer]/M[employer]    Authorization Time Period no auth required per eval appt notes    Progress Note Due on Visit 10    PT Start Time 1100    PT Stop Time 1148    PT Time Calculation (min) 48 min    Activity Tolerance Patient tolerated treatment well;No increased pain    Behavior During Therapy WFL for tasks assessed/performed                     Past Medical History:  Diagnosis Date   Asthma    followed by pcp   Bronchitis    chronic   Hypertension    OA (osteoarthritis)    left knee, right shoulder, left hip   Pneumonia    a couple years ago per pt on 09/15/21   Seasonal allergies    Wears glasses    Past Surgical History:  Procedure Laterality Date   ANTERIOR HIP REVISION Left 06/06/2018   Procedure: LEFT HIP WOUND DEHISCENCE POSSIBLE HEAD AND LINER EXCHANGE;  Surgeon: Fidel Rogue, MD;  Location: [facility];  Service: Orthopedics;  Laterality: Left;   COLONOSCOPY  2015   DIAGNOSTIC LAPAROSCOPY     tubal preg-took ovary and tube   LAPAROSCOPY FOR ECTOPIC PREGNANCY  1990s   LEFT SALPINGECTOMY AND RIGHT OOPHORECTOMY FOR ABNORMALITY   LUMBAR LAMINECTOMY N/A 09/20/2021   Procedure: L3-4 AND L4-5 CENTRAL LUMBAR LAMINECTOMIES;  Surgeon: Lucilla Lynwood BRAVO, MD;  Location: [facility];  Service: Orthopedics;  Laterality: N/A;   MASS EXCISION  02/15/2012   Procedure: EXCISION MASS;  Surgeon: Donnice DELENA Robinsons, MD;  Location: [facility];  Service: Orthopedics;  Laterality: Right;  Excision of Right Small Volar Mass   MYELOGRAM     TONSILLECTOMY  age 74   TOTAL HIP ARTHROPLASTY Left 04/25/2018   Procedure: TOTAL HIP ARTHROPLASTY ANTERIOR APPROACH;  Surgeon: Fidel Rogue, MD;  Location: [facility];  Service: Orthopedics;  Laterality: Left;   TRIGGER FINGER RELEASE Right 2018   thumb   WISDOM TOOTH EXTRACTION  age 46   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 04/12/2022   Status post lumbar laminectomy 09/20/2021   Body mass index (BMI) 37.0-37.9, adult 03/14/2019   Lumbar stenosis without neurogenic claudication 03/14/2019   Hyperlipidemia 02/08/2019   Lumbar spondylosis 10/25/2018   Degeneration of lumbar intervertebral disc 10/01/2018   Surgical wound dehiscence 06/06/2018   Osteoarthritis of left hip 04/25/2018   Spinal stenosis of lumbar region 02/23/2018   Pain in left knee 01/10/2018   Trigger thumb of right hand 03/30/2017   Symptomatic mammary hypertrophy 11/28/2014   Dyspnea 03/26/2013   Asthma vs VCD  02/28/2013   Hypertensive disorder 06/07/2012   Pyogenic granuloma 02/15/2012    PCP: Lorren Greig PARAS, NP  REFERRING PROVIDER: Georgina Ozell DELENA, MD  REFERRING DIAG: M70.62 (ICD-10-CM) - Trochanteric bursitis, left hip  THERAPY DIAG:  Pain in left hip  Muscle weakness (generalized)  Difficulty in walking, not elsewhere classified  Rationale for Evaluation and Treatment: Rehabilitation  ONSET DATE: acute on chronic, about a  month ago  SUBJECTIVE:   SUBJECTIVE STATEMENT: 03/09/2023  I felt pretty good after the last session. 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  EVAL- Pt endorses longstanding history of back and hip pain with surgeries (2023 and 2020 respectively). Reports worsening of hip pain over past month without change in activity or injury. States it does feel similar to pain from around time of surgery. Feels her hip is weak during car transfers and has reduced tolerance to standing/walking. Uses AD as needed. Denies N/T, no fevers/chills, no bowel/bladder issues.   PERTINENT HISTORY: asthma, HTN, prior  lumbar laminectomy July 2023 PAIN:  Are you having pain:4/10  Location/description: sharp and throbbing, L hip, anterior and lateral Best-worst over past week: 5-8/10 (takes a while to settle)  - aggravating factors: stairs, standing/walking (~10-15min, more limited by back), community navigation, sleeping on side, car transfer, laundry and other housework - Easing factors: icing, use of AD, recliner   PRECAUTIONS: None  WEIGHT BEARING RESTRICTIONS: No  FALLS:  Has patient fallen in last 6 months? No  LIVING ENVIRONMENT: Lives w/ daughter and 2 grandchildren 1 story home, 1STE Pt does housework, does require rest breaks due to back Has a walker she uses in mornings, uses cane when she is in the gym and in community   OCCUPATION: not working - enjoys walking in gym twice a week  PLOF: Independent  PATIENT GOALS: wants to try to exercise two days a week, wants to try to get stronger   NEXT MD VISIT: January 2025  OBJECTIVE:  Note: Objective measures were completed at Evaluation unless otherwise noted.  DIAGNOSTIC FINDINGS:  01/18/23 Lumbar XR, per results narrative: XRs of the lumbar spine from 01/18/2023 were independently reviewed and  interpreted, showing laminectomy defect from L3-5. No evidence of  instability on flexion/extension views. Disc height loss with anterior  osteophyte formation at multiple levels. No fracture or dislocation seen.   PATIENT SURVEYS:  FOTO 52 > 66 FOTO 02/27/23 67    COGNITION: Overall cognitive status: Within functional limits for tasks assessed     SENSATION: LT intact, no sensory complaints   LOWER EXTREMITY ROM:     Active  Right eval Left eval  Hip flexion    Hip extension    Hip internal rotation    Hip external rotation    Knee extension    Knee flexion    (Blank rows = not tested) (Key: WFL = within functional limits not formally assessed, * = concordant pain, s = stiffness/stretching sensation, NT = not tested)   Comments: not formally measured but pt is able to achieve ~45 deg of active L hip flexion in standing position  LOWER EXTREMITY MMT:    MMT Right eval Left eval  Hip flexion 4- 3- *  Hip abduction (modified sitting) 4+ 4+  Hip internal rotation 4+ 3- *  Hip external rotation 4+ 3- *  Knee flexion 5 4  Knee extension 5 5  Ankle dorsiflexion     (Blank rows = not tested) (Key: WFL = within functional limits not formally assessed, * = concordant pain, s = stiffness/stretching sensation, NT = not tested)  Comments:     FUNCTIONAL TESTS:  5xSTS: 13.49sec UE support standard chair   GAIT: Distance walked: within clinic Assistive device utilized: None Level of assistance: Complete Independence Comments: reduced gait speed/cadence, reduced stance time L hip, reduced clearance L LE   TREATMENT: [facility] Adult PT  Treatment:                                                DATE: 03/09/2023 Therapeutic Exercise: Glute med stretch standing 2 x 30  Nu-step L 6 x 5 min LE only Standing hip abduction/ extension with YTB Standing squat holding on to freemtion 2 x 10 Supine marching halted due to inability to lift LLE and modified to supine heels on red phyisoball rolling toward 3x 10 using RLE to assist with motion of the LLE Manual Therapy: Reviewing/ performeing self trigger point release techniques Neuromuscular re-ed: Standing toe taps on cone alternating L/R - modified to controlled marching with cues for soft touchdown of foot to empahsize control 2 x 20    [facility] Adult PT Treatment:                                                DATE: 03/07/2023 Therapeutic Exercise: Standing L Glute med stretch 2 x 30 sec  Nu-step L6 x 5:30 LE only Standing hip abduction/ extension 2 x 12 bil with HHA on free motion Sit to stand 2 x 10 with 3 sec eccentric loading, lowered table 1 inch between seats  Updated HEP to include glute med / IT band stretch  Manual Therapy: MTPR along the L glute med in R  sidleying - taught how to perform at home with tennis ball.      [facility] Adult PT Treatment:                                                DATE: 03/03/23 Therapeutic Exercise: Nu step during subjective LE only L5 4 inch lateral step down LLE only 2x8 cues for form and control  4 inch step LLE lift off for end range hip flex ROM 2x5 cues for posture and form (superset with hip flexor stretch below) Fwd lunge + L hip flexor stretch 2x8 cues for comfortable ROM (superset w/ LE lift off as above) 10# STS x10, 15# x5 cues for pacing Lunge at counter x6 LLE HEP handout + education/update  PATIENT EDUCATION:  Education details: rationale for interventions, HEP  Person educated: Patient Education method: Explanation, Demonstration, Tactile cues, Verbal cues Education comprehension: verbalized understanding, returned demonstration, verbal cues required, tactile cues required, and needs further education     HOME EXERCISE PROGRAM: Access Code: [email or web address] URL: [email or web address] Date: 03/07/2023 Prepared by: Joneen Fresh  Program Notes - with hip flexor stretch, please perform holding on to counter, as done in clinic  Exercises - Sit to Stand with Armchair  - 2-3 x daily - 1 sets - 5 reps - Standing March with Counter Support  - 2-3 x daily - 1 sets - 5 reps - Supine Piriformis Stretch with Foot on Ground  - 2-3 x daily - 1 sets - 5 reps - 15-30 sec hold - Supine Bridge with Mini Swiss Ball Between Knees  - 2-3 x daily - 1 sets - 10 reps - Standing 3-Way Leg Reach with Resistance at Ankles and Counter Support  - 2-3 x daily -  1 sets - 5 reps - Standing Hip Flexor Stretch  - 2-3 x daily - 1 sets - 8 reps - Forward Lunge with Back Leg Straight and Counter Support  - 2-3 x daily - 1 sets - 6 reps - ITB Stretch at Wall  - 1 x daily - 7 x weekly - 2 sets - 10 reps  ASSESSMENT:  CLINICAL IMPRESSION: 03/09/2023 Mrs Moncivais arrives to PT today noting 4/10 pain but attributes  it to doing more than she is used to. Continued STW for the Glute med due to pt opting to hold off on TPDN today. Continued working on strengthening and L hip mobiity / flexion which she required assistance to promote flexion. End if session she reported no changes in her pain level compared to when she arrived. Plan to do a progress note next session.    EVAL- Patient is a pleasant 58 y.o. woman who was seen today for physical therapy evaluation and treatment for L hip pain ongoing for several years but worsening over past month or so. She endorses weakness in hip and reduced tolerance to standing/walking. On exam she demonstrates reduced activity mobility of L hip and weakness of L hip flexors, rotational musculature, and hamstring. 5xSTS around cutoff time for fall risk but does require UE support. Tolerates HEP and exam well without adverse event or increase in resting pain, increased time w/ education on appropriate performance at home and rationale for interventions. Recommend trial of skilled PT to address aforementioned deficits with aim of improving functional tolerance and reducing pain with typical activities. Pt departs today's session in no acute distress, all voiced concerns/questions addressed appropriately from PT perspective.    OBJECTIVE IMPAIRMENTS: Abnormal gait, decreased activity tolerance, decreased endurance, decreased mobility, difficulty walking, decreased ROM, decreased strength, impaired perceived functional ability, improper body mechanics, postural dysfunction, and pain.   ACTIVITY LIMITATIONS: carrying, lifting, bending, sitting, standing, squatting, sleeping, stairs, transfers, and locomotion level  PARTICIPATION LIMITATIONS: meal prep, cleaning, laundry, shopping, and community activity  PERSONAL FACTORS: Age, Time since onset of injury/illness/exacerbation, and 3+ comorbidities: asthma, HTN, prior surgeries  are also affecting patient's functional outcome.   REHAB  POTENTIAL: Good  CLINICAL DECISION MAKING: Stable/uncomplicated  EVALUATION COMPLEXITY: Low   GOALS: Goals reviewed with patient? Yes  SHORT TERM GOALS: Target date: 03/02/2023 Pt will demonstrate appropriate understanding and performance of initially prescribed HEP in order to facilitate improved independence with management of symptoms.  Baseline: HEP provided on eval Goal status: MET  2. Pt will score greater than or equal to 59 on FOTO in order to demonstrate improved perception of function due to symptoms.  Baseline: 52  02/27/23: 67   Goal status: MET  LONG TERM GOALS: Target date: 03/30/2023 Pt will score 66 or greater on FOTO in order to demonstrate improved perception of function due to symptoms.  Baseline: 52 02/27/23: 67 Goal status: MET  2.  Pt will demonstrate at least 4/5 L hip flexion MMT in order to facilitate improved LE strength for transfers/gait. Baseline: see MMT chart above Goal status: INITIAL  3.  Pt will report at least 50% decrease in overall pain levels in past week in order to facilitate improved tolerance to basic ADLs/mobility.   Baseline: 5-8/10  Goal status: INITIAL    4.  Pt will be able to perform 5xSTS in less than or equal to 10sec in order to demonstrate reduced fall risk and improved functional independence ([facility] 5xSTS = 2.3 sec). Baseline: 13sec w/ UE  support Goal status: INITIAL   5. Pt will report ability to perform typical household tasks such as cleaning/laundry with less than 3 pt increase in L hip pain on NPS in order to facilitate improved independence w/ daily tasks.  Baseline: increased pain w/ household tasks  Goal status: INITIAL  6. Pt will demonstrate appropriate performance of final prescribed HEP in order to facilitate improved self-management of symptoms post-discharge.   Baseline: initial HEP prescribed  Goal status: INITIAL     PLAN:  PT FREQUENCY: 2x/week  PT DURATION: 8 weeks  PLANNED INTERVENTIONS: [id number]- PT  Re-evaluation, 97110-Therapeutic exercises, 97530- Therapeutic activity, [id number]- Neuromuscular re-education, 97535- Self Care, [id number]- Manual therapy, [phone]- Gait training, [phone]- Aquatic Therapy, Patient/Family education, Balance training, Stair training, Taping, Dry Needling, Joint mobilization, Spinal mobilization, Cryotherapy, and Moist heat  PLAN FOR NEXT SESSION: Review/update HEP PRN. Work on [employer] exercises as appropriate with emphasis on L hip strength and functional mechanics. Symptom modification strategies as indicated/appropriate.   Estellar Cadena PT, DPT, LAT, ATC  03/09/23  11:50 AM

## 2023-03-11 ENCOUNTER — Other Ambulatory Visit: Payer: Self-pay | Admitting: Family

## 2023-03-11 DIAGNOSIS — Z6838 Body mass index (BMI) 38.0-38.9, adult: Secondary | ICD-10-CM

## 2023-03-11 DIAGNOSIS — Z7689 Persons encountering health services in other specified circumstances: Secondary | ICD-10-CM

## 2023-03-13 NOTE — Telephone Encounter (Signed)
 Schedule appointment?

## 2023-03-14 ENCOUNTER — Ambulatory Visit: Payer: 59 | Admitting: Physical Therapy

## 2023-03-14 DIAGNOSIS — M25552 Pain in left hip: Secondary | ICD-10-CM

## 2023-03-14 DIAGNOSIS — M6281 Muscle weakness (generalized): Secondary | ICD-10-CM

## 2023-03-14 DIAGNOSIS — R262 Difficulty in walking, not elsewhere classified: Secondary | ICD-10-CM

## 2023-03-14 NOTE — Telephone Encounter (Signed)
 Schedule appointment?

## 2023-03-14 NOTE — Therapy (Signed)
 OUTPATIENT PHYSICAL THERAPY TREATMENT  Progress Note Reporting Period 02/02/2023 to 03/14/2023  See note below for Objective Data and Assessment of Progress/Goals.       Patient Name: Darlene Crosby MRN: [id number] DOB:1965/05/15, 58 y.o., female Today's Date: 03/14/2023  END OF SESSION:  PT End of Session - 03/14/23 1101     Visit Number 10    Number of Visits 17    Date for PT Re-Evaluation 03/30/23    Authorization Type [employer]/M[employer]    Authorization Time Period no auth required per eval appt notes    Progress Note Due on Visit 20    PT Start Time 1100    PT Stop Time 1148    PT Time Calculation (min) 48 min    Activity Tolerance Patient tolerated treatment well;No increased pain    Behavior During Therapy WFL for tasks assessed/performed                      Past Medical History:  Diagnosis Date   Asthma    followed by pcp   Bronchitis    chronic   Hypertension    OA (osteoarthritis)    left knee, right shoulder, left hip   Pneumonia    a couple years ago per pt on 09/15/21   Seasonal allergies    Wears glasses    Past Surgical History:  Procedure Laterality Date   ANTERIOR HIP REVISION Left 06/06/2018   Procedure: LEFT HIP WOUND DEHISCENCE POSSIBLE HEAD AND LINER EXCHANGE;  Surgeon: Fidel Rogue, MD;  Location: [facility];  Service: Orthopedics;  Laterality: Left;   COLONOSCOPY  2015   DIAGNOSTIC LAPAROSCOPY     tubal preg-took ovary and tube   LAPAROSCOPY FOR ECTOPIC PREGNANCY  1990s   LEFT SALPINGECTOMY AND RIGHT OOPHORECTOMY FOR ABNORMALITY   LUMBAR LAMINECTOMY N/A 09/20/2021   Procedure: L3-4 AND L4-5 CENTRAL LUMBAR LAMINECTOMIES;  Surgeon: Lucilla Lynwood BRAVO, MD;  Location: [facility];  Service: Orthopedics;  Laterality: N/A;   MASS EXCISION  02/15/2012   Procedure: EXCISION MASS;  Surgeon: Donnice DELENA Robinsons, MD;  Location: [facility];  Service: Orthopedics;  Laterality: Right;  Excision of Right Small Volar Mass   MYELOGRAM      TONSILLECTOMY  age 1   TOTAL HIP ARTHROPLASTY Left 04/25/2018   Procedure: TOTAL HIP ARTHROPLASTY ANTERIOR APPROACH;  Surgeon: Fidel Rogue, MD;  Location: [facility];  Service: Orthopedics;  Laterality: Left;   TRIGGER FINGER RELEASE Right 2018   thumb   WISDOM TOOTH EXTRACTION  age 9   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 04/12/2022   Status post lumbar laminectomy 09/20/2021   Body mass index (BMI) 37.0-37.9, adult 03/14/2019   Lumbar stenosis without neurogenic claudication 03/14/2019   Hyperlipidemia 02/08/2019   Lumbar spondylosis 10/25/2018   Degeneration of lumbar intervertebral disc 10/01/2018   Surgical wound dehiscence 06/06/2018   Osteoarthritis of left hip 04/25/2018   Spinal stenosis of lumbar region 02/23/2018   Pain in left knee 01/10/2018   Trigger thumb of right hand 03/30/2017   Symptomatic mammary hypertrophy 11/28/2014   Dyspnea 03/26/2013   Asthma vs VCD  02/28/2013   Hypertensive disorder 06/07/2012   Pyogenic granuloma 02/15/2012    PCP: Lorren Greig PARAS, NP  REFERRING PROVIDER: Georgina Ozell DELENA, MD  REFERRING DIAG: M70.62 (ICD-10-CM) - Trochanteric bursitis, left hip  THERAPY DIAG:  Pain in left hip  Muscle  weakness (generalized)  Difficulty in walking, not elsewhere classified  Rationale for Evaluation and Treatment: Rehabilitation  ONSET DATE: acute on chronic, about a month ago  SUBJECTIVE:   SUBJECTIVE STATEMENT: 03/14/2023 The hip isn't too bad, the pain is still about a 4/10. The cold doesn't help, but I do wake up at a 6/10 but that's also because I sleep on that hip.  EVAL- Pt endorses longstanding history of back and hip pain with surgeries (2023 and 2020 respectively). Reports worsening of hip pain over past month without change in activity or injury. States it does feel similar to pain from around time of surgery. Feels her hip is weak during car  transfers and has reduced tolerance to standing/walking. Uses AD as needed. Denies N/T, no fevers/chills, no bowel/bladder issues.   PERTINENT HISTORY: asthma, HTN, prior lumbar laminectomy July 2023 PAIN:  Are you having pain:4/10  Location/description: sharp and throbbing, L hip, anterior and lateral Best-worst over past week: 5-8/10 (takes a while to settle)  - aggravating factors: stairs, standing/walking (~10-48min, more limited by back), community navigation, sleeping on side, car transfer, laundry and other housework - Easing factors: icing, use of AD, recliner   PRECAUTIONS: None  WEIGHT BEARING RESTRICTIONS: No  FALLS:  Has patient fallen in last 6 months? No  LIVING ENVIRONMENT: Lives w/ daughter and 2 grandchildren 1 story home, 1STE Pt does housework, does require rest breaks due to back Has a walker she uses in mornings, uses cane when she is in the gym and in community   OCCUPATION: not working - enjoys walking in gym twice a week  PLOF: Independent  PATIENT GOALS: wants to try to exercise two days a week, wants to try to get stronger   NEXT MD VISIT: January 2025  OBJECTIVE:  Note: Objective measures were completed at Evaluation unless otherwise noted.  DIAGNOSTIC FINDINGS:  01/18/23 Lumbar XR, per results narrative: XRs of the lumbar spine from 01/18/2023 were independently reviewed and  interpreted, showing laminectomy defect from L3-5. No evidence of  instability on flexion/extension views. Disc height loss with anterior  osteophyte formation at multiple levels. No fracture or dislocation seen.   PATIENT SURVEYS:  FOTO 52 > 66 FOTO 02/27/23 67    COGNITION: Overall cognitive status: Within functional limits for tasks assessed     SENSATION: LT intact, no sensory complaints   LOWER EXTREMITY ROM:     Active  Right eval Left eval  Hip flexion    Hip extension    Hip internal rotation    Hip external rotation    Knee extension    Knee  flexion    (Blank rows = not tested) (Key: WFL = within functional limits not formally assessed, * = concordant pain, s = stiffness/stretching sensation, NT = not tested)  Comments: not formally measured but pt is able to achieve ~45 deg of active L hip flexion in standing position  LOWER EXTREMITY MMT:    MMT Right eval Left eval  Hip flexion 4- 3- *  Hip abduction (modified sitting) 4+ 4+  Hip internal rotation 4+ 3- *  Hip external rotation 4+ 3- *  Knee flexion 5 4  Knee extension 5 5  Ankle dorsiflexion     (Blank rows = not tested) (Key: WFL = within functional limits not formally assessed, * = concordant pain, s = stiffness/stretching sensation, NT = not tested)  Comments:     FUNCTIONAL TESTS:  5xSTS: 13.49sec UE support standard chair   GAIT:  Distance walked: within clinic Assistive device utilized: None Level of assistance: Complete Independence Comments: reduced gait speed/cadence, reduced stance time L hip, reduced clearance L LE   TREATMENT: [facility] Adult PT Treatment:                                                DATE: 03/14/23 Therapeutic Exercise: SLR 2 x 10 Heel slides with in supine with heel on slide board and pillow case 2 x 12 Elliptical L1, ramp L1 x  Standing toe taps on 6 inch step, near counter with 1 HHA for stability 2 x 12 Standing hip ITB stretch 2 x 30 sec  Manual Therapy: MTPR using theracane - reviewed use LAD grade III-IV LLE L hip AP grade III   [facility] Adult PT Treatment:                                                DATE: 03/09/2023 Therapeutic Exercise: Glute med stretch standing 2 x 30  Nu-step L 6 x 5 min LE only Standing hip abduction/ extension with YTB Standing squat holding on to freemtion 2 x 10 Supine marching halted due to inability to lift LLE and modified to supine heels on red phyisoball rolling toward 3x 10 using RLE to assist with motion of the LLE Manual Therapy: Reviewing/ performeing self trigger point release  techniques Neuromuscular re-ed: Standing toe taps on cone alternating L/R - modified to controlled marching with cues for soft touchdown of foot to empahsize control 2 x 20    [facility] Adult PT Treatment:                                                DATE: 03/07/2023 Therapeutic Exercise: Standing L Glute med stretch 2 x 30 sec  Nu-step L6 x 5:30 LE only Standing hip abduction/ extension 2 x 12 bil with HHA on free motion Sit to stand 2 x 10 with 3 sec eccentric loading, lowered table 1 inch between seats  Updated HEP to include glute med / IT band stretch  Manual Therapy: MTPR along the L glute med in R sidleying - taught how to perform at home with tennis ball.      PATIENT EDUCATION:  Education details: rationale for interventions, HEP  Person educated: Patient Education method: Explanation, Demonstration, Tactile cues, Verbal cues Education comprehension: verbalized understanding, returned demonstration, verbal cues required, tactile cues required, and needs further education     HOME EXERCISE PROGRAM: Access Code: [email or web address] URL: [email or web address] Date: 03/07/2023 Prepared by: Joneen Fresh  Program Notes - with hip flexor stretch, please perform holding on to counter, as done in clinic  Exercises - Sit to Stand with Armchair  - 2-3 x daily - 1 sets - 5 reps - Standing March with Counter Support  - 2-3 x daily - 1 sets - 5 reps - Supine Piriformis Stretch with Foot on Ground  - 2-3 x daily - 1 sets - 5 reps - 15-30 sec hold - Supine Bridge with Mini Swiss Ball Between Knees  - 2-3 x daily - 1 sets -  10 reps - Standing 3-Way Leg Reach with Resistance at Ankles and Counter Support  - 2-3 x daily - 1 sets - 5 reps - Standing Hip Flexor Stretch  - 2-3 x daily - 1 sets - 8 reps - Forward Lunge with Back Leg Straight and Counter Support  - 2-3 x daily - 1 sets - 6 reps - ITB Stretch at Wall  - 1 x daily - 7 x weekly - 2 sets - 10 reps  ASSESSMENT:  CLINICAL  IMPRESSION: 1/14/2025Mrs Shawgo arrives to PT today noting pain at 4/10 that is worse in the AM due to laying on that side noting it is 6/10. Started session with manual techniques with MTPR which she continued to opt out of TPDN, followed with hip mobs which demonstrated improved with hip flexor activation following. She does fatigue quickly with hip flexor activation, but was able to complete exercises prescribed. She was able to complete standing hip flexion/ toe taps on 6 inch step with hand on counter which is a significant improvement compared to previous session. End of session she noted pain dropped to a 3/10, plan to continue working on gross L hip mobility.    EVAL- Patient is a pleasant 58 y.o. woman who was seen today for physical therapy evaluation and treatment for L hip pain ongoing for several years but worsening over past month or so. She endorses weakness in hip and reduced tolerance to standing/walking. On exam she demonstrates reduced activity mobility of L hip and weakness of L hip flexors, rotational musculature, and hamstring. 5xSTS around cutoff time for fall risk but does require UE support. Tolerates HEP and exam well without adverse event or increase in resting pain, increased time w/ education on appropriate performance at home and rationale for interventions. Recommend trial of skilled PT to address aforementioned deficits with aim of improving functional tolerance and reducing pain with typical activities. Pt departs today's session in no acute distress, all voiced concerns/questions addressed appropriately from PT perspective.    OBJECTIVE IMPAIRMENTS: Abnormal gait, decreased activity tolerance, decreased endurance, decreased mobility, difficulty walking, decreased ROM, decreased strength, impaired perceived functional ability, improper body mechanics, postural dysfunction, and pain.   ACTIVITY LIMITATIONS: carrying, lifting, bending, sitting, standing, squatting, sleeping,  stairs, transfers, and locomotion level  PARTICIPATION LIMITATIONS: meal prep, cleaning, laundry, shopping, and community activity  PERSONAL FACTORS: Age, Time since onset of injury/illness/exacerbation, and 3+ comorbidities: asthma, HTN, prior surgeries  are also affecting patient's functional outcome.   REHAB POTENTIAL: Good  CLINICAL DECISION MAKING: Stable/uncomplicated  EVALUATION COMPLEXITY: Low   GOALS: Goals reviewed with patient? Yes  SHORT TERM GOALS: Target date: 03/02/2023 Pt will demonstrate appropriate understanding and performance of initially prescribed HEP in order to facilitate improved independence with management of symptoms.  Baseline: HEP provided on eval Goal status: MET  2. Pt will score greater than or equal to 59 on FOTO in order to demonstrate improved perception of function due to symptoms.  Baseline: 52  02/27/23: 67   Goal status: MET  LONG TERM GOALS: Target date: 03/30/2023 Pt will score 66 or greater on FOTO in order to demonstrate improved perception of function due to symptoms.  Baseline: 52 02/27/23: 67 Goal status: MET  2.  Pt will demonstrate at least 4/5 L hip flexion MMT in order to facilitate improved LE strength for transfers/gait. Baseline: see MMT chart above Goal status: INITIAL  3.  Pt will report at least 50% decrease in overall pain levels in past week  in order to facilitate improved tolerance to basic ADLs/mobility.   Baseline: 5-8/10  Goal status: INITIAL    4.  Pt will be able to perform 5xSTS in less than or equal to 10sec in order to demonstrate reduced fall risk and improved functional independence (MCID 5xSTS = 2.3 sec). Baseline: 13sec w/ UE support Goal status: INITIAL   5. Pt will report ability to perform typical household tasks such as cleaning/laundry with less than 3 pt increase in L hip pain on NPS in order to facilitate improved independence w/ daily tasks.  Baseline: increased pain w/ household tasks  Goal  status: INITIAL  6. Pt will demonstrate appropriate performance of final prescribed HEP in order to facilitate improved self-management of symptoms post-discharge.   Baseline: initial HEP prescribed  Goal status: INITIAL     PLAN:  PT FREQUENCY: 2x/week  PT DURATION: 8 weeks  PLANNED INTERVENTIONS: [id number]- PT Re-evaluation, 97110-Therapeutic exercises, 97530- Therapeutic activity, [id number]- Neuromuscular re-education, 97535- Self Care, [id number]- Manual therapy, [phone]- Gait training, [phone]- Aquatic Therapy, Patient/Family education, Balance training, Stair training, Taping, Dry Needling, Joint mobilization, Spinal mobilization, Cryotherapy, and Moist heat  PLAN FOR NEXT SESSION: Review/update HEP PRN. Work on [employer] exercises as appropriate with emphasis on L hip strength and functional mechanics. Symptom modification strategies as indicated/appropriate.   Zeinab Rodwell PT, DPT, LAT, ATC  03/14/23  11:56 AM

## 2023-03-15 NOTE — Telephone Encounter (Signed)
 Schedule appointment?

## 2023-03-16 ENCOUNTER — Ambulatory Visit: Payer: 59 | Admitting: Physical Therapy

## 2023-03-16 DIAGNOSIS — M25552 Pain in left hip: Secondary | ICD-10-CM | POA: Diagnosis not present

## 2023-03-16 DIAGNOSIS — M6281 Muscle weakness (generalized): Secondary | ICD-10-CM

## 2023-03-16 DIAGNOSIS — R262 Difficulty in walking, not elsewhere classified: Secondary | ICD-10-CM

## 2023-03-16 NOTE — Therapy (Signed)
OUTPATIENT PHYSICAL THERAPY TREATMENT   Patient Name: Darlene Crosby MRN: [id number] DOB:1965-07-05, 58 y.o., female Today's Date: 03/16/2023  END OF SESSION:  PT End of Session - 03/16/23 1107     Visit Number 11    Number of Visits 17    Date for PT Re-Evaluation 03/30/23    Authorization Type [employer]/M[employer]    Authorization Time Period no auth required per eval appt notes    Progress Note Due on Visit 20    PT Start Time 1105    PT Stop Time 1146    PT Time Calculation (min) 41 min    Activity Tolerance Patient tolerated treatment well;No increased pain    Behavior During Therapy WFL for tasks assessed/performed                       Past Medical History:  Diagnosis Date   Asthma    followed by pcp   Bronchitis    "chronic"   Hypertension    OA (osteoarthritis)    left knee, right shoulder, left hip   Pneumonia    "a couple years ago" per pt on 09/15/21   Seasonal allergies    Wears glasses    Past Surgical History:  Procedure Laterality Date   ANTERIOR HIP REVISION Left 06/06/2018   Procedure: LEFT HIP WOUND DEHISCENCE POSSIBLE HEAD AND LINER EXCHANGE;  Surgeon: Samson Frederic, MD;  Location: [facility];  Service: Orthopedics;  Laterality: Left;   COLONOSCOPY  2015   DIAGNOSTIC LAPAROSCOPY     tubal preg-took ovary and tube   LAPAROSCOPY FOR ECTOPIC PREGNANCY  1990s   LEFT SALPINGECTOMY AND RIGHT OOPHORECTOMY FOR ABNORMALITY   LUMBAR LAMINECTOMY N/A 09/20/2021   Procedure: L3-4 AND L4-5 CENTRAL LUMBAR LAMINECTOMIES;  Surgeon: Kerrin Champagne, MD;  Location: [facility];  Service: Orthopedics;  Laterality: N/A;   MASS EXCISION  02/15/2012   Procedure: EXCISION MASS;  Surgeon: Marlowe Shores, MD;  Location: [facility];  Service: Orthopedics;  Laterality: Right;  Excision of Right Small Volar Mass   MYELOGRAM     TONSILLECTOMY  age 32   TOTAL HIP ARTHROPLASTY Left 04/25/2018   Procedure: TOTAL HIP ARTHROPLASTY ANTERIOR APPROACH;  Surgeon:  Samson Frederic, MD;  Location: [facility];  Service: Orthopedics;  Laterality: Left;   TRIGGER FINGER RELEASE Right 2018   thumb   WISDOM TOOTH EXTRACTION  age 70   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 04/12/2022   Status post lumbar laminectomy 09/20/2021   Body mass index (BMI) 37.0-37.9, adult 03/14/2019   Lumbar stenosis without neurogenic claudication 03/14/2019   Hyperlipidemia 02/08/2019   Lumbar spondylosis 10/25/2018   Degeneration of lumbar intervertebral disc 10/01/2018   Surgical wound dehiscence 06/06/2018   Osteoarthritis of left hip 04/25/2018   Spinal stenosis of lumbar region 02/23/2018   Pain in left knee 01/10/2018   Trigger thumb of right hand 03/30/2017   Symptomatic mammary hypertrophy 11/28/2014   Dyspnea 03/26/2013   Asthma vs VCD  02/28/2013   Hypertensive disorder 06/07/2012   Pyogenic granuloma 02/15/2012    PCP: Rema Fendt, NP  REFERRING PROVIDER: London Sheer, MD  REFERRING DIAG: M70.62 (ICD-10-CM) - Trochanteric bursitis, left hip  THERAPY DIAG:  Pain in left hip  Muscle weakness (generalized)  Difficulty in walking, not elsewhere classified  Rationale for Evaluation and Treatment: Rehabilitation  ONSET DATE: acute on chronic,  about a month ago  SUBJECTIVE:   SUBJECTIVE STATEMENT: "Hips been doing okay, no pain today."  EVAL- Pt endorses longstanding history of back and hip pain with surgeries (2023 and 2020 respectively). Reports worsening of hip pain over past month without change in activity or injury. States it does feel similar to pain from around time of surgery. Feels her hip is weak during car transfers and has reduced tolerance to standing/walking. Uses AD as needed. Denies N/T, no fevers/chills, no bowel/bladder issues.   PERTINENT HISTORY: asthma, HTN, prior lumbar laminectomy July 2023 PAIN:  Are you having  pain:0/10  Location/description: sharp and throbbing, L hip, anterior and lateral Best-worst over past week: 5-8/10 (takes a while to settle)  - aggravating factors: stairs, standing/walking (~10-58min, more limited by back), community navigation, sleeping on side, car transfer, laundry and other housework - Easing factors: icing, use of AD, recliner   PRECAUTIONS: None  WEIGHT BEARING RESTRICTIONS: No  FALLS:  Has patient fallen in last 6 months? No  LIVING ENVIRONMENT: Lives w/ daughter and 2 grandchildren 1 story home, 1STE Pt does housework, does require rest breaks due to back Has a walker she uses in mornings, uses cane when she is in the gym and in community   OCCUPATION: not working - enjoys walking in gym twice a week  PLOF: Independent  PATIENT GOALS: wants to try to exercise two days a week, wants to try to get stronger   NEXT MD VISIT: January 2025  OBJECTIVE:  Note: Objective measures were completed at Evaluation unless otherwise noted.  DIAGNOSTIC FINDINGS:  01/18/23 Lumbar XR, per results narrative: "XRs of the lumbar spine from 01/18/2023 were independently reviewed and  interpreted, showing laminectomy defect from L3-5. No evidence of  instability on flexion/extension views. Disc height loss with anterior  osteophyte formation at multiple levels. No fracture or dislocation seen. "  PATIENT SURVEYS:  FOTO 52 > 66 FOTO 02/27/23 67    COGNITION: Overall cognitive status: Within functional limits for tasks assessed     SENSATION: LT intact, no sensory complaints   LOWER EXTREMITY ROM:     Active  Right eval Left eval  Hip flexion    Hip extension    Hip internal rotation    Hip external rotation    Knee extension    Knee flexion    (Blank rows = not tested) (Key: WFL = within functional limits not formally assessed, * = concordant pain, s = stiffness/stretching sensation, NT = not tested)  Comments: not formally measured but pt is able to  achieve ~45 deg of active L hip flexion in standing position  LOWER EXTREMITY MMT:    MMT Right eval Left eval  Hip flexion 4- 3- *  Hip abduction (modified sitting) 4+ 4+  Hip internal rotation 4+ 3- *  Hip external rotation 4+ 3- *  Knee flexion 5 4  Knee extension 5 5  Ankle dorsiflexion     (Blank rows = not tested) (Key: WFL = within functional limits not formally assessed, * = concordant pain, s = stiffness/stretching sensation, NT = not tested)  Comments:     FUNCTIONAL TESTS:  5xSTS: 13.49sec UE support standard chair   GAIT: Distance walked: within clinic Assistive device utilized: None Level of assistance: Complete Independence Comments: reduced gait speed/cadence, reduced stance time L hip, reduced clearance L LE   TREATMENT: [facility] Adult PT Treatment:  DATE: 03/16/2023 Therapeutic Exercise: Nu-steps x L5 LE only 3 x 1:00 step ups 6in step with 1 hand on counter Seated alternated knee extension 2 x 1:30 4#  Sit to stand with GTB around knees 3 x 1:00   Updated HEP today    [facility] Adult PT Treatment:                                                DATE: 03/14/23 Therapeutic Exercise: SLR 2 x 10 Heel slides with in supine with heel on slide board and pillow case 2 x 12 Elliptical L1, ramp L1 x  Standing toe taps on 6 inch step, near counter with 1 HHA for stability 2 x 12 Standing hip ITB stretch 2 x 30 sec  Manual Therapy: MTPR using theracane - reviewed use LAD grade III-IV LLE L hip AP grade III   [facility] Adult PT Treatment:                                                DATE: 03/09/2023 Therapeutic Exercise: Glute med stretch standing 2 x 30  Nu-step L 6 x 5 min LE only Standing hip abduction/ extension with YTB Standing squat holding on to freemtion 2 x 10 Supine marching halted due to inability to lift LLE and modified to supine heels on red phyisoball rolling toward 3x 10 using RLE to assist with  motion of the LLE Manual Therapy: Reviewing/ performeing self trigger point release techniques Neuromuscular re-ed: Standing toe taps on cone alternating L/R - modified to controlled marching with cues for soft touchdown of foot to empahsize control 2 x 20   PATIENT EDUCATION:  Education details: rationale for interventions, HEP  Person educated: Patient Education method: Explanation, Demonstration, Tactile cues, Verbal cues Education comprehension: verbalized understanding, returned demonstration, verbal cues required, tactile cues required, and needs further education     HOME EXERCISE PROGRAM: Access Code: [email or web address] URL: [email or web address] Date: 03/16/2023 Prepared by: Lulu Riding  Program Notes - with hip flexor stretch, please perform holding on to counter, as done in clinic  Exercises - Sit to Stand with Armchair  - 2-3 x daily - 1 sets - 5 reps - Standing March with Counter Support  - 2-3 x daily - 1 sets - 5 reps - Supine Piriformis Stretch with Foot on Ground  - 2-3 x daily - 1 sets - 5 reps - 15-30 sec hold - Supine Bridge with Mini Swiss Ball Between Knees  - 2-3 x daily - 1 sets - 10 reps - Standing 3-Way Leg Reach with Resistance at Ankles and Counter Support  - 2-3 x daily - 1 sets - 5 reps - Standing Hip Flexor Stretch  - 2-3 x daily - 1 sets - 8 reps - Forward Lunge with Back Leg Straight and Counter Support  - 2-3 x daily - 1 sets - 6 reps - ITB Stretch at Wall  - 1 x daily - 7 x weekly - 2 sets - 10 reps - Sit to Stand with Resistance Around Legs  - 1 x daily - 7 x weekly - 2 sets - 15 reps  ASSESSMENT:  CLINICAL IMPRESSION: 1/16/2025Mrs Nagorski arrives to session today reporting no pain  today. Hip mobility demonstrate visual improvement and due to report of no report of pain continued working on bil Lower extremity strengthening with emphasis on endurance with increased reps utiilizing time vs reps. End of session she reported no pain and  that she is making progress.   EVAL- Patient is a pleasant 58 y.o. woman who was seen today for physical therapy evaluation and treatment for L hip pain ongoing for several years but worsening over past month or so. She endorses weakness in hip and reduced tolerance to standing/walking. On exam she demonstrates reduced activity mobility of L hip and weakness of L hip flexors, rotational musculature, and hamstring. 5xSTS around cutoff time for fall risk but does require UE support. Tolerates HEP and exam well without adverse event or increase in resting pain, increased time w/ education on appropriate performance at home and rationale for interventions. Recommend trial of skilled PT to address aforementioned deficits with aim of improving functional tolerance and reducing pain with typical activities. Pt departs today's session in no acute distress, all voiced concerns/questions addressed appropriately from PT perspective.    OBJECTIVE IMPAIRMENTS: Abnormal gait, decreased activity tolerance, decreased endurance, decreased mobility, difficulty walking, decreased ROM, decreased strength, impaired perceived functional ability, improper body mechanics, postural dysfunction, and pain.   ACTIVITY LIMITATIONS: carrying, lifting, bending, sitting, standing, squatting, sleeping, stairs, transfers, and locomotion level  PARTICIPATION LIMITATIONS: meal prep, cleaning, laundry, shopping, and community activity  PERSONAL FACTORS: Age, Time since onset of injury/illness/exacerbation, and 3+ comorbidities: asthma, HTN, prior surgeries  are also affecting patient's functional outcome.   REHAB POTENTIAL: Good  CLINICAL DECISION MAKING: Stable/uncomplicated  EVALUATION COMPLEXITY: Low   GOALS: Goals reviewed with patient? Yes  SHORT TERM GOALS: Target date: 03/02/2023 Pt will demonstrate appropriate understanding and performance of initially prescribed HEP in order to facilitate improved independence with  management of symptoms.  Baseline: HEP provided on eval Goal status: MET  2. Pt will score greater than or equal to 59 on FOTO in order to demonstrate improved perception of function due to symptoms.  Baseline: 52  02/27/23: 67   Goal status: MET  LONG TERM GOALS: Target date: 03/30/2023 Pt will score 66 or greater on FOTO in order to demonstrate improved perception of function due to symptoms.  Baseline: 52 02/27/23: 67 Goal status: MET  2.  Pt will demonstrate at least 4/5 L hip flexion MMT in order to facilitate improved LE strength for transfers/gait. Baseline: see MMT chart above Goal status: INITIAL  3.  Pt will report at least 50% decrease in overall pain levels in past week in order to facilitate improved tolerance to basic ADLs/mobility.   Baseline: 5-8/10  Goal status: INITIAL    4.  Pt will be able to perform 5xSTS in less than or equal to 10sec in order to demonstrate reduced fall risk and improved functional independence (MCID 5xSTS = 2.3 sec). Baseline: 13sec w/ UE support Goal status: INITIAL   5. Pt will report ability to perform typical household tasks such as cleaning/laundry with less than 3 pt increase in L hip pain on NPS in order to facilitate improved independence w/ daily tasks.  Baseline: increased pain w/ household tasks  Goal status: INITIAL  6. Pt will demonstrate appropriate performance of final prescribed HEP in order to facilitate improved self-management of symptoms post-discharge.   Baseline: initial HEP prescribed  Goal status: INITIAL     PLAN:  PT FREQUENCY: 2x/week  PT DURATION: 8 weeks  PLANNED INTERVENTIONS: 54098- PT  Re-evaluation, 97110-Therapeutic exercises, 97530- Therapeutic activity, [id number]- Neuromuscular re-education, [phone]- Self Care, [id number]- Manual therapy, [phone]- Gait training, [phone]- Aquatic Therapy, Patient/Family education, Balance training, Stair training, Taping, Dry Needling, Joint mobilization, Spinal mobilization,  Cryotherapy, and Moist heat  PLAN FOR NEXT SESSION: Review/update HEP PRN. Work on [employer] exercises as appropriate with emphasis on L hip strength and functional mechanics. Symptom modification strategies as indicated/appropriate.   Sadee Osland PT, DPT, LAT, ATC  03/16/23  12:03 PM

## 2023-03-17 ENCOUNTER — Ambulatory Visit: Payer: Commercial Managed Care - HMO | Admitting: Orthopedic Surgery

## 2023-03-20 ENCOUNTER — Other Ambulatory Visit: Payer: Self-pay

## 2023-03-20 ENCOUNTER — Other Ambulatory Visit: Payer: Self-pay | Admitting: Family

## 2023-03-20 DIAGNOSIS — J3089 Other allergic rhinitis: Secondary | ICD-10-CM

## 2023-03-20 MED ORDER — LEVOCETIRIZINE DIHYDROCHLORIDE 5 MG PO TABS: 5.0000 mg | ORAL_TABLET | Freq: Every evening | ORAL | 0 refills | Status: DC

## 2023-03-21 ENCOUNTER — Ambulatory Visit: Payer: 59 | Admitting: Physical Therapy

## 2023-03-23 ENCOUNTER — Ambulatory Visit: Payer: 59 | Admitting: Physical Therapy

## 2023-03-23 ENCOUNTER — Encounter: Payer: Self-pay | Admitting: Physical Therapy

## 2023-03-23 DIAGNOSIS — M25552 Pain in left hip: Secondary | ICD-10-CM

## 2023-03-23 DIAGNOSIS — R262 Difficulty in walking, not elsewhere classified: Secondary | ICD-10-CM

## 2023-03-23 DIAGNOSIS — M6281 Muscle weakness (generalized): Secondary | ICD-10-CM

## 2023-03-23 NOTE — Therapy (Signed)
OUTPATIENT PHYSICAL THERAPY TREATMENT/DISCHARGE PHYSICAL THERAPY DISCHARGE SUMMARY  Visits from Start of Care: 12  Current functional level related to goals / functional outcomes: Pt is able to go dancing with cane and is attending gym routinely   Remaining deficits: Pt improved to 4/10 aching pain in Left hip and 4/5 muscle strength.   Education / Equipment: HEP   Patient agrees to discharge. Patient goals were met. Patient is being discharged due to meeting the stated rehab goals.    Patient Name: Darlene Crosby MRN: [id number] DOB:[birth year], 58 y.o., female Today's Date: 03/23/2023  END OF SESSION:  PT End of Session - 03/23/23 1019     Visit Number 12    Number of Visits 17    Date for PT Re-Evaluation 03/30/23    Authorization Type U[employer]/MCD    Authorization Time Period no auth required per eval appt notes    Progress Note Due on Visit 20    PT Start Time 1017    PT Stop Time 1100    PT Time Calculation (min) 43 min    Activity Tolerance Patient tolerated treatment well;No increased pain    Behavior During Therapy WFL for tasks assessed/performed                        Past Medical History:  Diagnosis Date   Asthma    followed by pcp   Bronchitis    "chronic"   Hypertension    OA (osteoarthritis)    left knee, right shoulder, left hip   Pneumonia    "a couple years ago" per pt on 09/15/21   Seasonal allergies    Wears glasses    Past Surgical History:  Procedure Laterality Date   ANTERIOR HIP REVISION Left 06/06/2018   Procedure: LEFT HIP WOUND DEHISCENCE POSSIBLE HEAD AND LINER EXCHANGE;  Surgeon: Samson Frederic, MD;  Location: [facility];  Service: Orthopedics;  Laterality: Left;   COLONOSCOPY  2015   DIAGNOSTIC LAPAROSCOPY     tubal preg-took ovary and tube   LAPAROSCOPY FOR ECTOPIC PREGNANCY  1990s   LEFT SALPINGECTOMY AND RIGHT OOPHORECTOMY FOR ABNORMALITY   LUMBAR LAMINECTOMY N/A 09/20/2021   Procedure: L3-4 AND L4-5 CENTRAL LUMBAR  LAMINECTOMIES;  Surgeon: Kerrin Champagne, MD;  Location: [facility];  Service: Orthopedics;  Laterality: N/A;   MASS EXCISION  02/15/2012   Procedure: EXCISION MASS;  Surgeon: Marlowe Shores, MD;  Location: [facility];  Service: Orthopedics;  Laterality: Right;  Excision of Right Small Volar Mass   MYELOGRAM     TONSILLECTOMY  age 22   TOTAL HIP ARTHROPLASTY Left 04/25/2018   Procedure: TOTAL HIP ARTHROPLASTY ANTERIOR APPROACH;  Surgeon: Samson Frederic, MD;  Location: [facility];  Service: Orthopedics;  Laterality: Left;   TRIGGER FINGER RELEASE Right 2018   thumb   WISDOM TOOTH EXTRACTION  age 4   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 04/12/2022   Status post lumbar laminectomy 09/20/2021   Body mass index (BMI) 37.0-37.9, adult 03/14/2019   Lumbar stenosis without neurogenic claudication 03/14/2019   Hyperlipidemia 02/08/2019   Lumbar spondylosis 10/25/2018   Degeneration of lumbar intervertebral disc 10/01/2018   Surgical wound dehiscence 06/06/2018   Osteoarthritis of left hip 04/25/2018   Spinal stenosis of lumbar region 02/23/2018   Pain in left knee 01/10/2018   Trigger thumb of right hand 03/30/2017   Symptomatic mammary  hypertrophy 11/28/2014   Dyspnea 03/26/2013   Asthma vs VCD  02/28/2013   Hypertensive disorder 06/07/2012   Pyogenic granuloma 02/15/2012    PCP: Rema Fendt, NP  REFERRING PROVIDER: London Sheer, MD  REFERRING DIAG: M70.62 (ICD-10-CM) - Trochanteric bursitis, left hip  THERAPY DIAG:  Pain in left hip  Muscle weakness (generalized)  Difficulty in walking, not elsewhere classified  Rationale for Evaluation and Treatment: Rehabilitation  ONSET DATE: acute on chronic, about a month ago  SUBJECTIVE:   SUBJECTIVE STATEMENT: Hips are doing ok today. I am 4/10 today for my hip. I just keep moving. I am at the gym now and I did go dancing and I take  my cane for safety.  EVAL- Pt endorses longstanding history of back and hip pain with surgeries (2023 and 2020 respectively). Reports worsening of hip pain over past month without change in activity or injury. States it does feel similar to pain from around time of surgery. Feels her hip is weak during car transfers and has reduced tolerance to standing/walking. Uses AD as needed. Denies N/T, no fevers/chills, no bowel/bladder issues.   PERTINENT HISTORY: asthma, HTN, prior lumbar laminectomy July 2023 PAIN:  Are you having pain:0/10  Location/description: sharp and throbbing, L hip, anterior and lateral Best-worst over past week: 5-8/10 (takes a while to settle)  - aggravating factors: stairs, standing/walking (~10-53min, more limited by back), community navigation, sleeping on side, car transfer, laundry and other housework - Easing factors: icing, use of AD, recliner   PRECAUTIONS: None  WEIGHT BEARING RESTRICTIONS: No  FALLS:  Has patient fallen in last 6 months? No  LIVING ENVIRONMENT: Lives w/ daughter and 2 grandchildren 1 story home, 1STE Pt does housework, does require rest breaks due to back Has a walker she uses in mornings, uses cane when she is in the gym and in community   OCCUPATION: not working - enjoys walking in gym twice a week  PLOF: Independent  PATIENT GOALS: wants to try to exercise two days a week, wants to try to get stronger   NEXT MD VISIT: January 2025  OBJECTIVE:  Note: Objective measures were completed at Evaluation unless otherwise noted.  DIAGNOSTIC FINDINGS:  01/18/23 Lumbar XR, per results narrative: "XRs of the lumbar spine from 01/18/2023 were independently reviewed and  interpreted, showing laminectomy defect from L3-5. No evidence of  instability on flexion/extension views. Disc height loss with anterior  osteophyte formation at multiple levels. No fracture or dislocation seen. "  PATIENT SURVEYS:  FOTO 52 > 66 FOTO 02/27/23 67     COGNITION: Overall cognitive status: Within functional limits for tasks assessed     SENSATION: LT intact, no sensory complaints   LOWER EXTREMITY ROM:     Active  Right eval Left eval  Hip flexion    Hip extension    Hip internal rotation    Hip external rotation    Knee extension    Knee flexion    (Blank rows = not tested) (Key: WFL = within functional limits not formally assessed, * = concordant pain, s = stiffness/stretching sensation, NT = not tested)  Comments: not formally measured but pt is able to achieve ~45 deg of active L hip flexion in standing position  LOWER EXTREMITY MMT:    MMT Right eval Left eval R/L 03-23-23  Hip flexion 4- 3- * 4+/4  Hip abduction (modified sitting) 4+ 4+ 4+/4+  Hip internal rotation 4+ 3- * 4+/ 4  Hip external rotation  4+ 3- * 4+/4  Knee flexion 5 4 5/4+  Knee extension 5 5 5/5  Ankle dorsiflexion      (Blank rows = not tested) (Key: WFL = within functional limits not formally assessed, * = concordant pain, s = stiffness/stretching sensation, NT = not tested)  Comments:     FUNCTIONAL TESTS:  5xSTS: 13.49sec UE support standard chair  03-23-23 5xSTS  12.88 sec  GAIT: Distance walked: within clinic Assistive device utilized: None Level of assistance: Complete Independence Comments: reduced gait speed/cadence, reduced stance time L hip, reduced clearance L LE   TREATMENT: [facility] Adult PT Treatment:                                                DATE: 03-23-23 Therapeutic Exercise: Sit to stand  with 20# 3 x 10 3 x 1:00 step ups 6in step with 1 hand on counter Seated alternated knee extension 2 x 1:30 4#  Standing March with Counter Support  2 x 10 Supine Piriformis Stretch with Foot on Ground  2 reps 15-30 sec hold Supine Bridge with Mini Swiss Ball Between Knees  2 x 10 Standing 3-Way Leg Reach with 5# cuff weights at Ankles and Counter Support   2 x 10 each Standing Hip Flexor Stretch  8 reps each side with UE  support Forward Lunge with Back Leg Straight and Counter Support 6 reps each side ITB Stretch at [employer] 2 sets - 10 reps Sit to Stand with Resistance Around Legs GTB  2 x 10 Manual Therapy: MTPR  over left hip LAD grade III-IV LLE  Self Care: Discussed community wellness and progressive strengthening  [facility] Adult PT Treatment:                                                DATE: 03/16/2023 Therapeutic Exercise: Nu-steps x L5 LE only 3 x 1:00 step ups 6in step with 1 hand on counter Seated alternated knee extension 2 x 1:30 4#  Sit to stand with GTB around knees 3 x 1:00   Updated HEP today    [facility] Adult PT Treatment:                                                DATE: 03/14/23 Therapeutic Exercise: SLR 2 x 10 Heel slides with in supine with heel on slide board and pillow case 2 x 12 Elliptical L1, ramp L1 x  Standing toe taps on 6 inch step, near counter with 1 HHA for stability 2 x 12 Standing hip ITB stretch 2 x 30 sec  Manual Therapy: MTPR using theracane - reviewed use LAD grade III-IV LLE L hip AP grade III   [facility] Adult PT Treatment:                                                DATE: 03/09/2023 Therapeutic Exercise: Glute med stretch standing 2 x 30  Nu-step L  6 x 5 min LE only Standing hip abduction/ extension with YTB Standing squat holding on to freemtion 2 x 10 Supine marching halted due to inability to lift LLE and modified to supine heels on red phyisoball rolling toward 3x 10 using RLE to assist with motion of the LLE Manual Therapy: Reviewing/ performeing self trigger point release techniques Neuromuscular re-ed: Standing toe taps on cone alternating L/R - modified to controlled marching with cues for soft touchdown of foot to empahsize control 2 x 20   PATIENT EDUCATION:  Education details: rationale for interventions, HEP  Person educated: Patient Education method: Explanation, Demonstration, Tactile cues, Verbal cues Education comprehension:  verbalized understanding, returned demonstration, verbal cues required, tactile cues required, and needs further education     HOME EXERCISE PROGRAM:  Access Code: [email or web address] URL: [email or web address] Date: 03/23/2023 Prepared by: Garen Lah  Program Notes - with hip flexor stretch, please perform holding on to counter, as done in clinic  Exercises - sit to stand holding weight  - 1 x daily - 7 x weekly - 3 sets - 10 reps - Standing March with Counter Support  - 2-3 x daily - 1 sets - 5 reps - Supine Piriformis Stretch with Foot on Ground  - 2-3 x daily - 1 sets - 5 reps - 15-30 sec hold - Supine Bridge with Mini Swiss Ball Between Knees  - 2-3 x daily - 1 sets - 10 reps - Standing 3-Way Leg Reach with Resistance at Ankles and Counter Support  - 2-3 x daily - 1 sets - 5 reps - Standing Hip Flexor Stretch  - 2-3 x daily - 1 sets - 8 reps - Forward Lunge with Back Leg Straight and Counter Support  - 2-3 x daily - 1 sets - 6 reps - ITB Stretch at Wall  - 1 x daily - 7 x weekly - 2 sets - 10 reps - Sit to Stand with Resistance Around Legs  - 1 x daily - 7 x weekly - 2 sets - 15 reps ASSESSMENT:  CLINICAL IMPRESSION: 1/23/2025Mrs Crosby arrives to session today reporting  4/10 pain today but is pleased with current functional level.  Pt has achieved all goals. She reports that she was able to go dance and beginning to attend the gym regularly for continued strengthening.  Pt pain reported improved with manual and exercise.  Hip mobility demonstrate visual improvement  in increased AROM End of session she reported minimal pain and that she is making progress and approves and agrees with DC at this time. She has been an exemplary pt with her PT.    EVAL- Patient is a pleasant 58 y.o. woman who was seen today for physical therapy evaluation and treatment for L hip pain ongoing for several years but worsening over past month or so. She endorses weakness in hip and reduced  tolerance to standing/walking. On exam she demonstrates reduced activity mobility of L hip and weakness of L hip flexors, rotational musculature, and hamstring. 5xSTS around cutoff time for fall risk but does require UE support. Tolerates HEP and exam well without adverse event or increase in resting pain, increased time w/ education on appropriate performance at home and rationale for interventions. Recommend trial of skilled PT to address aforementioned deficits with aim of improving functional tolerance and reducing pain with typical activities. Pt departs today's session in no acute distress, all voiced concerns/questions addressed appropriately from PT perspective.    OBJECTIVE IMPAIRMENTS: Abnormal gait, decreased activity tolerance, decreased  endurance, decreased mobility, difficulty walking, decreased ROM, decreased strength, impaired perceived functional ability, improper body mechanics, postural dysfunction, and pain.   ACTIVITY LIMITATIONS: carrying, lifting, bending, sitting, standing, squatting, sleeping, stairs, transfers, and locomotion level  PARTICIPATION LIMITATIONS: meal prep, cleaning, laundry, shopping, and community activity  PERSONAL FACTORS: Age, Time since onset of injury/illness/exacerbation, and 3+ comorbidities: asthma, HTN, prior surgeries  are also affecting patient's functional outcome.   REHAB POTENTIAL: Good  CLINICAL DECISION MAKING: Stable/uncomplicated  EVALUATION COMPLEXITY: Low   GOALS: Goals reviewed with patient? Yes  SHORT TERM GOALS: Target date: 03/02/2023 Pt will demonstrate appropriate understanding and performance of initially prescribed HEP in order to facilitate improved independence with management of symptoms.  Baseline: HEP provided on eval Goal status: MET  2. Pt will score greater than or equal to 59 on FOTO in order to demonstrate improved perception of function due to symptoms.  Baseline: 52  02/27/23: 67   Goal status: MET  LONG TERM  GOALS: Target date: 03/30/2023 Pt will score 66 or greater on FOTO in order to demonstrate improved perception of function due to symptoms.  Baseline: 52 02/27/23: 67 Goal status: MET  2.  Pt will demonstrate at least 4/5 L hip flexion MMT in order to facilitate improved LE strength for transfers/gait. Baseline: see MMT chart above Goal status: MET  3.  Pt will report at least 50% decrease in overall pain levels in past week in order to facilitate improved tolerance to basic ADLs/mobility.   Baseline: 5-8/10  03-23-23  4/10  Goal status: MET  4.  Pt will be able to perform 5xSTS in less than or equal to 10sec in order to demonstrate reduced fall risk and improved functional independence (MCID 5xSTS = 2.3 sec). Baseline: 13sec w/ UE support 03-23-23  12.88 Goal status: MET  5. Pt will report ability to perform typical household tasks such as cleaning/laundry with less than 3 pt increase in L hip pain on NPS in order to facilitate improved independence w/ daily tasks.  Baseline: increased pain w/ household tasks  03-23-23  Pt with pain at 4/10 and states she needs more time to complete tasks but it does not increase pain  Goal status:MET  6. Pt will demonstrate appropriate performance of final prescribed HEP in order to facilitate improved self-management of symptoms post-discharge.   Baseline: initial HEP prescribed  Goal status: MET   PLAN:  PT FREQUENCY: 2x/week  PT DURATION: 8 weeks  PLANNED INTERVENTIONS: 97164- PT Re-evaluation, 97110-Therapeutic exercises, 97530- Therapeutic activity, [id number]- Neuromuscular re-education, 97535- Self Care, 29562- Manual therapy, [phone]- Gait training, [phone]- Aquatic Therapy, Patient/Family education, Balance training, Stair training, Taping, Dry Needling, Joint mobilization, Spinal mobilization, Cryotherapy, and Moist heat  PLAN FOR NEXT SESSION: Review/update HEP PRN. Work on [employer] exercises as appropriate with emphasis on L hip strength  and functional mechanics. Symptom modification strategies as indicated/appropriate.   Garen Lah, PT, ATRIC Certified Exercise Expert for the Aging Adult  03/23/23 11:17 AM Phone: [phone] Fax: [phone]

## 2023-03-25 ENCOUNTER — Other Ambulatory Visit: Payer: Self-pay | Admitting: Orthopedic Surgery

## 2023-03-31 ENCOUNTER — Ambulatory Visit (INDEPENDENT_AMBULATORY_CARE_PROVIDER_SITE_OTHER): Payer: 59 | Admitting: Podiatry

## 2023-03-31 ENCOUNTER — Encounter: Payer: Self-pay | Admitting: Podiatry

## 2023-03-31 DIAGNOSIS — R600 Localized edema: Secondary | ICD-10-CM

## 2023-03-31 NOTE — Progress Notes (Signed)
Subjective:   Patient ID: Darlene Crosby, female   DOB: 58 y.o.   MRN: [id number]   HPI Patient states she has been having a lot of swelling in her left ankle after having hip replacement and get some generalized swelling in her leg thinking that it is probably her veins   ROS      Objective:  Physical Exam  Neurovascular status intact edema in the left ankle +1 pitting with varicosities negative Denna Haggard' sign was noted     Assessment:  Chronic edema left ankle with possibility of changes from having had the hip replacement     Plan:  H&P reviewed advised this patient on elevation and dispensed ankle compression stocking to try to help with the swelling and will be seen back as needed

## 2023-04-13 ENCOUNTER — Ambulatory Visit: Payer: 59

## 2023-04-13 DIAGNOSIS — Z0000 Encounter for general adult medical examination without abnormal findings: Secondary | ICD-10-CM | POA: Diagnosis not present

## 2023-04-13 NOTE — Patient Instructions (Addendum)
Darlene Crosby , Thank you for taking time to come for your Medicare Wellness Visit. I appreciate your ongoing commitment to your health goals. Please review the following plan we discussed and let me know if I can assist you in the future.   Referrals/Orders/Follow-Ups/Clinician Recommendations: none  This is a list of the screening recommended for you and due dates:  Health Maintenance  Topic Date Due   Pneumococcal Vaccination (1 of 2 - PCV) [birth year]/1969   Colon Cancer Screening  10/18/2023   Medicare Annual Wellness Visit  04/12/2024   Mammogram  11/17/2024   Pap with HPV screening  08/29/2027   DTaP/Tdap/Td vaccine (2 - Td or Tdap) 08/17/2029   Flu Shot  Completed   COVID-19 Vaccine  Completed   Hepatitis C Screening  Completed   HIV Screening  Completed   Zoster (Shingles) Vaccine  Completed   HPV Vaccine  Aged Out    Advanced directives: (ACP Link)Information on Advanced Care Planning can be found at [facility] Advance Health Care Directives Advance Health Care Directives ([email or web address]   Next Medicare Annual Wellness Visit scheduled for next year: Yes  insert Preventive Care attachment Insert FALL PREVENTION attachment if needed

## 2023-04-13 NOTE — Progress Notes (Signed)
Subjective:   Darlene Crosby is a 58 y.o. female who presents for an Initial Medicare Annual Wellness Visit.  Visit Complete: Virtual I connected with  Christiane Ha on 04/13/23 by a audio enabled telemedicine application and verified that I am speaking with the correct person using two identifiers. Interactive audio and video telecommunications were attempted between this provider and patient, however failed, due to patient having technical difficulties OR patient did not have access to video capability.  We continued and completed visit with audio only.  Patient Location: Home  Provider Location: Home Office  I discussed the limitations of evaluation and management by telemedicine. The patient expressed understanding and agreed to proceed.  Vital Signs: Because this visit was a virtual/telehealth visit, some criteria may be missing or patient reported. Any vitals not documented were not able to be obtained and vitals that have been documented are patient reported.    Cardiac Risk Factors include: advanced age (>92men, >39 women);dyslipidemia;hypertension     Objective:    Today's Vitals   04/13/23 1416  PainSc: 8    There is no height or weight on file to calculate BMI.     04/13/2023    2:24 PM 02/02/2023    9:22 AM 09/15/2021   11:17 AM 03/12/2021    9:35 AM 11/30/2020    9:22 AM 06/06/2018   11:19 AM 04/25/2018   12:00 PM  Advanced Directives  Does Patient Have a Medical Advance Directive? No No No No No No No  Would patient like information on creating a medical advance directive?  No - Patient declined No - Patient declined No - Patient declined No - Patient declined No - Patient declined No - Patient declined    Current Medications (verified) Outpatient Encounter Medications as of 04/13/2023  Medication Sig   acetaminophen (TYLENOL) 650 MG CR tablet Take 1,300 mg by mouth every 8 (eight) hours as needed for pain.   albuterol (VENTOLIN HFA) 108 (90 Base) MCG/ACT  inhaler Inhale 2 puffs into the lungs every 4 (four) hours as needed (wheezing and SOB).   Ascorbic Acid (VITAMIN C PO) Take 1 tablet by mouth daily.   atorvastatin (LIPITOR) 20 MG tablet Take 1 tablet (20 mg total) by mouth daily.   brompheniramine-pseudoephedrine-DM 30-2-10 MG/5ML syrup Take 5 mLs by mouth 4 (four) times daily as needed.   Calcium-Magnesium-Zinc (CAL-MAG-ZINC PO) Take 1 tablet by mouth daily.   Cholecalciferol (VITAMIN D) 50 MCG (2000 UT) tablet Take 2,000 Units by mouth daily.   diclofenac (VOLTAREN) 50 MG EC tablet TAKE 1 TABLET BY MOUTH 3 TIMES  DAILY   ELDERBERRY PO Take 1 tablet by mouth daily.   fluticasone (FLONASE) 50 MCG/ACT nasal spray Place 1 spray into both nostrils daily.   hydrochlorothiazide (HYDRODIURIL) 25 MG tablet Take by mouth.   ipratropium-albuterol (DUONEB) 0.5-2.5 (3) MG/3ML SOLN Inhale 3 mLs into the lungs every 6 (six) hours as needed for shortness of breath.   levocetirizine (XYZAL) 5 MG tablet TAKE 1 TABLET BY MOUTH ONCE DAILY IN THE EVENING   levocetirizine (XYZAL) 5 MG tablet Take 1 tablet (5 mg total) by mouth every evening.   pregabalin (LYRICA) 75 MG capsule TAKE 1 CAPSULE BY MOUTH TWICE  DAILY   Probiotic Product (PROBIOTIC PO) Take 1 capsule by mouth daily.   Spacer/Aero-Holding Chambers DEVI 1 Units by Does not apply route in the morning, at noon, and at bedtime.   Turmeric 500 MG CAPS Take 500 mg by mouth daily.  vitamin B-12 (CYANOCOBALAMIN) 1000 MCG tablet Take 1,000 mcg by mouth daily.   amLODipine (NORVASC) 5 MG tablet Take 1 tablet (5 mg total) by mouth daily.   benzonatate (TESSALON) 100 MG capsule Take 1 capsule (100 mg total) by mouth 3 (three) times daily as needed for cough.   chlorpheniramine-HYDROcodone (TUSSIONEX) 10-8 MG/5ML Take 5 mLs by mouth every 12 (twelve) hours as needed for cough.   docusate sodium (COLACE) 100 MG capsule Take 1 capsule (100 mg total) by mouth 2 (two) times daily.   HYDROcodone-acetaminophen (NORCO)  7.5-325 MG tablet Take 1 tablet by mouth every 4 (four) hours as needed for moderate pain.   phentermine 37.5 MG capsule Take 1 capsule (37.5 mg total) by mouth every morning. (Patient not taking: Reported on 04/13/2023)   predniSONE (DELTASONE) 10 MG tablet Take 1 tablet (10 mg total) by mouth in the morning, at noon, and at bedtime.   No facility-administered encounter medications on file as of 04/13/2023.    Allergies (verified) Pineapple, Chocolate, Coconut (cocos nucifera), Strawberry extract, Wheat, Chocolate flavoring agent (non-screening), Flavoring agent (non-screening), and Pineapple flavoring agent (non-screening)   History: Past Medical History:  Diagnosis Date   Asthma    followed by pcp   Bronchitis    "chronic"   Hypertension    OA (osteoarthritis)    left knee, right shoulder, left hip   Pneumonia    "a couple years ago" per pt on 09/15/21   Seasonal allergies    Wears glasses    Past Surgical History:  Procedure Laterality Date   ANTERIOR HIP REVISION Left 06/06/2018   Procedure: LEFT HIP WOUND DEHISCENCE POSSIBLE HEAD AND LINER EXCHANGE;  Surgeon: Samson Frederic, MD;  Location: [facility];  Service: Orthopedics;  Laterality: Left;   COLONOSCOPY  2015   DIAGNOSTIC LAPAROSCOPY     tubal preg-took ovary and tube   LAPAROSCOPY FOR ECTOPIC PREGNANCY  1990s   LEFT SALPINGECTOMY AND RIGHT OOPHORECTOMY FOR ABNORMALITY   LUMBAR LAMINECTOMY N/A 09/20/2021   Procedure: L3-4 AND L4-5 CENTRAL LUMBAR LAMINECTOMIES;  Surgeon: Kerrin Champagne, MD;  Location: [facility];  Service: Orthopedics;  Laterality: N/A;   MASS EXCISION  02/15/2012   Procedure: EXCISION MASS;  Surgeon: Marlowe Shores, MD;  Location: [facility];  Service: Orthopedics;  Laterality: Right;  Excision of Right Small Volar Mass   MYELOGRAM     TONSILLECTOMY  age 34   TOTAL HIP ARTHROPLASTY Left 04/25/2018   Procedure: TOTAL HIP ARTHROPLASTY ANTERIOR APPROACH;  Surgeon: Samson Frederic, MD;  Location:  [facility];  Service: Orthopedics;  Laterality: Left;   TRIGGER FINGER RELEASE Right 2018   thumb   WISDOM TOOTH EXTRACTION  age 26   Family History  Problem Relation Age of Onset   Hypertension Mother    Asthma Mother    Allergies Mother    Breast cancer Sister    Heart disease Sister    Hypertension Sister    Stroke Sister    Diabetes Sister    Colon cancer Neg Hx    Esophageal cancer Neg Hx    Stomach cancer Neg Hx    Pancreatic cancer Neg Hx    Social History   Socioeconomic History   Marital status: Widowed    Spouse name: Not on file   Number of children: Not on file   Years of education: Not on file   Highest education level: Not on file  Occupational History   Not on file  Tobacco Use  Smoking status: Never    Passive exposure: Never   Smokeless tobacco: Never  Vaping Use   Vaping status: Never Used  Substance and Sexual Activity   Alcohol use: Not Currently    Alcohol/week: 1.0 standard drink of alcohol    Types: 1 Glasses of wine per week   Drug use: Never   Sexual activity: Not on file  Other Topics Concern   Not on file  Social History Narrative   Not on file   Social Drivers of Health   Financial Resource Strain: Low Risk  (04/13/2023)   Overall Financial Resource Strain (CARDIA)    Difficulty of Paying Living Expenses: Not hard at all  Food Insecurity: No Food Insecurity (04/13/2023)   Hunger Vital Sign    Worried About Running Out of Food in the Last Year: Never true    Ran Out of Food in the Last Year: Never true  Transportation Needs: No Transportation Needs (04/13/2023)   PRAPARE - Administrator, Civil Service (Medical): No    Lack of Transportation (Non-Medical): No  Physical Activity: Sufficiently Active (04/13/2023)   Exercise Vital Sign    Days of Exercise per Week: 3 days    Minutes of Exercise per Session: 90 min  Recent Concern: Physical Activity - Insufficiently Active (02/08/2023)   Exercise Vital Sign    Days of  Exercise per Week: 3 days    Minutes of Exercise per Session: 30 min  Stress: No Stress Concern Present (04/13/2023)   [employer] of Occupational Health - Occupational Stress Questionnaire    Feeling of Stress : Not at all  Social Connections: Socially Isolated (04/13/2023)   Social Connection and Isolation Panel [NHANES]    Frequency of Communication with Friends and Family: More than three times a week    Frequency of Social Gatherings with Friends and Family: Twice a week    Attends Religious Services: Never    Database administrator or Organizations: No    Attends Banker Meetings: Never    Marital Status: Widowed    Tobacco Counseling Counseling given: Not Answered   Clinical Intake:  Pre-visit preparation completed: Yes  Pain : 0-10 Pain Score: 8  Pain Type: Acute pain Pain Location: Generalized Pain Descriptors / Indicators: Aching Pain Onset: 1 to 4 weeks ago Pain Frequency: Constant     Nutritional Risks: None Diabetes: No  How often do you need to have someone help you when you read instructions, pamphlets, or other written materials from your doctor or pharmacy?: 1 - Never  Interpreter Needed?: No  Information entered by :: NAllen LPN   Activities of Daily Living    04/13/2023    2:18 PM  In your present state of health, do you have any difficulty performing the following activities:  Hearing? 0  Vision? 0  Difficulty concentrating or making decisions? 0  Walking or climbing stairs? 0  Dressing or bathing? 0  Doing errands, shopping? 0  Preparing Food and eating ? N  Using the Toilet? N  In the past six months, have you accidently leaked urine? N  Do you have problems with loss of bowel control? N  Managing your Medications? N  Managing your Finances? N  Housekeeping or managing your Housekeeping? N    Patient Care Team: Rema Fendt, NP as PCP - General (Nurse Practitioner)  Indicate any recent Medical Services you may  have received from other than [facility] providers in the past year (date  may be approximate).     Assessment:   This is a routine wellness examination for Darlene Crosby.  Hearing/Vision screen Hearing Screening - Comments:: Denies hearing issues Vision Screening - Comments:: Regular eye exams, [facility]   Goals Addressed             This Visit's Progress    Patient Stated       04/13/2023, wants to lose weight       Depression Screen    04/13/2023    2:26 PM 02/08/2023    8:24 AM 01/11/2023    8:55 AM 08/08/2022    1:32 PM 07/11/2022   10:19 AM 04/12/2022    9:59 AM 10/12/2021    9:59 AM  PHQ 2/9 Scores  PHQ - 2 Score 0 0 0 0 0 0 0  PHQ- 9 Score 4   0 0      Fall Risk    04/13/2023    2:25 PM 01/11/2023    8:54 AM 04/12/2022    9:58 AM 08/25/2020    9:38 AM 05/18/2020    1:54 PM  Fall Risk   Falls in the past year? 0 0 0 0 0  Number falls in past yr: 0 0 0 0 0  Injury with Fall? 0 0 0 0 0  Risk for fall due to : Medication side effect No Fall Risks No Fall Risks No Fall Risks No Fall Risks  Follow up Falls prevention discussed;Falls evaluation completed Falls evaluation completed Falls evaluation completed Falls evaluation completed Falls evaluation completed    MEDICARE RISK AT HOME: Medicare Risk at Home Any stairs in or around the home?: No If so, are there any without handrails?: No Home free of loose throw rugs in walkways, pet beds, electrical cords, etc?: Yes Adequate lighting in your home to reduce risk of falls?: Yes Life alert?: No Use of a cane, walker or w/c?: No Grab bars in the bathroom?: No Shower chair or bench in shower?: No Elevated toilet seat or a handicapped toilet?: Yes  TIMED UP AND GO:  Was the test performed? No    Cognitive Function:        04/13/2023    2:27 PM  6CIT Screen  What Year? 0 points  What month? 0 points  What time? 0 points  Count back from 20 0 points  Months in reverse 0 points  Repeat phrase 0 points  Total  Score 0 points    Immunizations Immunization History  Administered Date(s) Administered   Fluad Quad(high Dose 65+) 11/16/2021   Influenza Split 11/28/2012   Influenza,inj,quad, With Preservative 11/12/2017   Influenza-Unspecified 11/25/2017, 08/18/2019, 11/09/2020   PFIZER(Purple Top)SARS-COV-2 Vaccination 05/20/2019, 06/17/2019, 02/16/2020   Pfizer Covid-19 Vaccine Bivalent Booster 47yrs & up 12/15/2021   Pneumococcal-Unspecified 11/28/2012   Tdap 08/18/2019   Zoster Recombinant(Shingrix) 08/25/2020, 10/05/2020, 11/16/2021    TDAP status: Up to date  Flu Vaccine status: Up to date  Pneumococcal vaccine status: Due, Education has been provided regarding the importance of this vaccine. Advised may receive this vaccine at local pharmacy or Health Dept. Aware to provide a copy of the vaccination record if obtained from local pharmacy or Health Dept. Verbalized acceptance and understanding.  Covid-19 vaccine status: Information provided on how to obtain vaccines.   Qualifies for Shingles Vaccine? Yes   Zostavax completed No   Shingrix Completed?: Yes  Screening Tests Health Maintenance  Topic Date Due   Pneumococcal Vaccine 19-23 Years old (1 of 2 -  PCV) 05/[birth year]/1969   COVID-19 Vaccine (5 - 2024-25 season) 10/30/2022   Colonoscopy  10/18/2023   Medicare Annual Wellness (AWV)  04/12/2024   MAMMOGRAM  11/17/2024   Cervical Cancer Screening (HPV/Pap Cotest)  08/29/2027   DTaP/Tdap/Td (2 - Td or Tdap) 08/17/2029   INFLUENZA VACCINE  Completed   Hepatitis C Screening  Completed   HIV Screening  Completed   Zoster Vaccines- Shingrix  Completed   HPV VACCINES  Aged Out    Health Maintenance  Health Maintenance Due  Topic Date Due   Pneumococcal Vaccine 7-95 Years old (1 of 2 - PCV) 05/23/1969   COVID-19 Vaccine (5 - 2024-25 season) 10/30/2022    Colorectal cancer screening: Type of screening: Colonoscopy. Completed 10/17/2013. Repeat every 10 years  Mammogram status:  Completed 11/18/2022. Repeat every year  Bone Density status: n/a  Lung Cancer Screening: (Low Dose CT Chest recommended if Age 32-80 years, 20 pack-year currently smoking OR have quit w/in 15years.) does not qualify.   Lung Cancer Screening Referral: no  Additional Screening:  Hepatitis C Screening: does qualify; Completed 08/25/2020  Vision Screening: Recommended annual ophthalmology exams for early detection of glaucoma and other disorders of the eye. Is the patient up to date with their annual eye exam?  Yes  Who is the provider or what is the name of the office in which the patient attends annual eye exams? [facility] If pt is not established with a provider, would they like to be referred to a provider to establish care? No .   Dental Screening: Recommended annual dental exams for proper oral hygiene  Diabetic Foot Exam: n/a  Community Resource Referral / Chronic Care Management: CRR required this visit?  No   CCM required this visit?  No     Plan:     I have personally reviewed and noted the following in the patient's chart:   Medical and social history Use of alcohol, tobacco or illicit drugs  Current medications and supplements including opioid prescriptions. Patient is not currently taking opioid prescriptions. Functional ability and status Nutritional status Physical activity Advanced directives List of other physicians Hospitalizations, surgeries, and ER visits in previous 12 months Vitals Screenings to include cognitive, depression, and falls Referrals and appointments  In addition, I have reviewed and discussed with patient certain preventive protocols, quality metrics, and best practice recommendations. A written personalized care plan for preventive services as well as general preventive health recommendations were provided to patient.     Barb Merino, LPN   07/26/4130   After Visit Summary: (MyChart) Due to this being a telephonic visit, the  after visit summary with patients personalized plan was offered to patient via MyChart   Nurse Notes: none

## 2023-04-18 ENCOUNTER — Ambulatory Visit (INDEPENDENT_AMBULATORY_CARE_PROVIDER_SITE_OTHER): Payer: 59 | Admitting: Family

## 2023-04-18 ENCOUNTER — Encounter: Payer: Self-pay | Admitting: Family

## 2023-04-18 VITALS — BP 112/78 | HR 106 | Temp 98.0°F | Resp 16 | Ht 65.0 in | Wt 226.0 lb

## 2023-04-18 DIAGNOSIS — J45909 Unspecified asthma, uncomplicated: Secondary | ICD-10-CM

## 2023-04-18 DIAGNOSIS — I10 Essential (primary) hypertension: Secondary | ICD-10-CM

## 2023-04-18 DIAGNOSIS — Z6837 Body mass index (BMI) 37.0-37.9, adult: Secondary | ICD-10-CM

## 2023-04-18 DIAGNOSIS — Z7689 Persons encountering health services in other specified circumstances: Secondary | ICD-10-CM

## 2023-04-18 DIAGNOSIS — E785 Hyperlipidemia, unspecified: Secondary | ICD-10-CM

## 2023-04-18 MED ORDER — ALBUTEROL SULFATE HFA 108 (90 BASE) MCG/ACT IN AERS: 2.0000 | INHALATION_SPRAY | RESPIRATORY_TRACT | 2 refills | Status: AC | PRN

## 2023-04-18 MED ORDER — AMLODIPINE BESYLATE 5 MG PO TABS: 5.0000 mg | ORAL_TABLET | Freq: Every day | ORAL | 0 refills | Status: DC

## 2023-04-18 MED ORDER — ATORVASTATIN CALCIUM 20 MG PO TABS: 20.0000 mg | ORAL_TABLET | Freq: Every day | ORAL | 0 refills | Status: DC

## 2023-04-18 MED ORDER — PHENTERMINE HCL 37.5 MG PO CAPS: 37.5000 mg | ORAL_CAPSULE | ORAL | 0 refills | Status: DC

## 2023-04-18 NOTE — Progress Notes (Signed)
 Patient ID: Darlene Crosby, female    DOB: 02-27-1966  MRN: [id number]  CC: Chronic Conditions Follow-Up  Subjective: Darlene Crosby is a 58 y.o. female who presents for chronic conditions follow-up.   Her concerns today include:  - Doing well on Amlodipine, no issues/concerns. She does not complain of red flag symptoms such as but not limited to chest pain, shortness of breath, worst headache of life, nausea/vomiting.  - Doing well on Atorvastatin, no issues/concerns.  - Doing well on Albuterol inhaler, no issues/concerns. Denies red flag symptoms. - Doing well on Phentermine, no issues/concerns.   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 04/12/2022   Status post lumbar laminectomy 09/20/2021   Body mass index (BMI) 37.0-37.9, adult 03/14/2019   Lumbar stenosis without neurogenic claudication 03/14/2019   Hyperlipidemia 02/08/2019   Lumbar spondylosis 10/25/2018   Degeneration of lumbar intervertebral disc 10/01/2018   Surgical wound dehiscence 06/06/2018   Osteoarthritis of left hip 04/25/2018   Spinal stenosis of lumbar region 02/23/2018   Pain in left knee 01/10/2018   Trigger thumb of right hand 03/30/2017   Symptomatic mammary hypertrophy 11/28/2014   Dyspnea 03/26/2013   Asthma vs VCD  02/28/2013   Hypertensive disorder 06/07/2012   Pyogenic granuloma 02/15/2012     Current Outpatient Medications on File Prior to Visit  Medication Sig Dispense Refill   acetaminophen (TYLENOL) 650 MG CR tablet Take 1,300 mg by mouth every 8 (eight) hours as needed for pain.     Ascorbic Acid (VITAMIN C PO) Take 1 tablet by mouth daily.     brompheniramine-pseudoephedrine-DM 30-2-10 MG/5ML syrup Take 5 mLs by mouth 4 (four) times daily as needed. 120 mL 0   Calcium-Magnesium-Zinc (CAL-MAG-ZINC PO) Take 1 tablet by mouth daily.     Cholecalciferol (VITAMIN D) 50 MCG (2000 UT) tablet Take 2,000 Units by mouth  daily.     diclofenac (VOLTAREN) 50 MG EC tablet TAKE 1 TABLET BY MOUTH 3 TIMES  DAILY 300 tablet 2   ELDERBERRY PO Take 1 tablet by mouth daily.     fluticasone (FLONASE) 50 MCG/ACT nasal spray Place 1 spray into both nostrils daily.     hydrochlorothiazide (HYDRODIURIL) 25 MG tablet Take by mouth.     ipratropium-albuterol (DUONEB) 0.5-2.5 (3) MG/3ML SOLN Inhale 3 mLs into the lungs every 6 (six) hours as needed for shortness of breath.     levocetirizine (XYZAL) 5 MG tablet TAKE 1 TABLET BY MOUTH ONCE DAILY IN THE EVENING 30 tablet 0   levocetirizine (XYZAL) 5 MG tablet Take 1 tablet (5 mg total) by mouth every evening. 30 tablet 0   pregabalin (LYRICA) 75 MG capsule TAKE 1 CAPSULE BY MOUTH TWICE  DAILY 60 capsule 0   Probiotic Product (PROBIOTIC PO) Take 1 capsule by mouth daily.     Spacer/Aero-Holding Chambers DEVI 1 Units by Does not apply route in the morning, at noon, and at bedtime. 1 Units 0   Turmeric 500 MG CAPS Take 500 mg by mouth daily.     vitamin B-12 (CYANOCOBALAMIN) 1000 MCG tablet Take 1,000 mcg by mouth daily.     benzonatate (TESSALON) 100 MG capsule Take 1 capsule (100 mg total) by mouth 3 (three) times daily as needed for cough. 30 capsule 1   chlorpheniramine-HYDROcodone (TUSSIONEX) 10-8 MG/5ML Take 5 mLs by mouth every 12 (twelve) hours as needed for cough. 70 mL 0   docusate sodium (  COLACE) 100 MG capsule Take 1 capsule (100 mg total) by mouth 2 (two) times daily. 40 capsule 0   HYDROcodone-acetaminophen (NORCO) 7.5-325 MG tablet Take 1 tablet by mouth every 4 (four) hours as needed for moderate pain. 40 tablet 0   predniSONE (DELTASONE) 10 MG tablet Take 1 tablet (10 mg total) by mouth in the morning, at noon, and at bedtime. 15 tablet 0   No current facility-administered medications on file prior to visit.    Allergies  Allergen Reactions   Pineapple Shortness Of Breath, Swelling and Hives    Swelling of tongue    Chocolate Hives   Coconut (Cocos Nucifera)  Hives and Rash   Strawberry Extract Hives   Wheat Hives   Chocolate Flavoring Agent (Non-Screening) Other (See Comments) and Hives   Flavoring Agent (Non-Screening) Hives   Pineapple Flavoring Agent (Non-Screening) Hives    Social History   Socioeconomic History   Marital status: Widowed    Spouse name: Not on file   Number of children: Not on file   Years of education: Not on file   Highest education level: Not on file  Occupational History   Not on file  Tobacco Use   Smoking status: Never    Passive exposure: Never   Smokeless tobacco: Never  Vaping Use   Vaping status: Never Used  Substance and Sexual Activity   Alcohol use: Not Currently    Alcohol/week: 1.0 standard drink of alcohol    Types: 1 Glasses of wine per week   Drug use: Never   Sexual activity: Not on file  Other Topics Concern   Not on file  Social History Narrative   Not on file   Social Drivers of Health   Financial Resource Strain: Low Risk  (04/13/2023)   Overall Financial Resource Strain (CARDIA)    Difficulty of Paying Living Expenses: Not hard at all  Food Insecurity: No Food Insecurity (04/13/2023)   Hunger Vital Sign    Worried About Running Out of Food in the Last Year: Never true    Ran Out of Food in the Last Year: Never true  Transportation Needs: No Transportation Needs (04/13/2023)   PRAPARE - Administrator, Civil Service (Medical): No    Lack of Transportation (Non-Medical): No  Physical Activity: Sufficiently Active (04/13/2023)   Exercise Vital Sign    Days of Exercise per Week: 3 days    Minutes of Exercise per Session: 90 min  Recent Concern: Physical Activity - Insufficiently Active (02/08/2023)   Exercise Vital Sign    Days of Exercise per Week: 3 days    Minutes of Exercise per Session: 30 min  Stress: No Stress Concern Present (04/13/2023)   [employer] of Occupational Health - Occupational Stress Questionnaire    Feeling of Stress : Not at all  Social  Connections: Socially Isolated (04/13/2023)   Social Connection and Isolation Panel [NHANES]    Frequency of Communication with Friends and Family: More than three times a week    Frequency of Social Gatherings with Friends and Family: Twice a week    Attends Religious Services: Never    Database administrator or Organizations: No    Attends Banker Meetings: Never    Marital Status: Widowed  Intimate Partner Violence: Not At Risk (04/13/2023)   Humiliation, Afraid, Rape, and Kick questionnaire    Fear of Current or Ex-Partner: No    Emotionally Abused: No    Physically Abused: No  Sexually Abused: No    Family History  Problem Relation Age of Onset   Hypertension Mother    Asthma Mother    Allergies Mother    Breast cancer Sister    Heart disease Sister    Hypertension Sister    Stroke Sister    Diabetes Sister    Colon cancer Neg Hx    Esophageal cancer Neg Hx    Stomach cancer Neg Hx    Pancreatic cancer Neg Hx     Past Surgical History:  Procedure Laterality Date   ANTERIOR HIP REVISION Left 06/06/2018   Procedure: LEFT HIP WOUND DEHISCENCE POSSIBLE HEAD AND LINER EXCHANGE;  Surgeon: Samson Frederic, MD;  Location: [facility];  Service: Orthopedics;  Laterality: Left;   COLONOSCOPY  2015   DIAGNOSTIC LAPAROSCOPY     tubal preg-took ovary and tube   LAPAROSCOPY FOR ECTOPIC PREGNANCY  1990s   LEFT SALPINGECTOMY AND RIGHT OOPHORECTOMY FOR ABNORMALITY   LUMBAR LAMINECTOMY N/A 09/20/2021   Procedure: L3-4 AND L4-5 CENTRAL LUMBAR LAMINECTOMIES;  Surgeon: Kerrin Champagne, MD;  Location: [facility];  Service: Orthopedics;  Laterality: N/A;   MASS EXCISION  02/15/2012   Procedure: EXCISION MASS;  Surgeon: Marlowe Shores, MD;  Location: [facility];  Service: Orthopedics;  Laterality: Right;  Excision of Right Small Volar Mass   MYELOGRAM     TONSILLECTOMY  age 68   TOTAL HIP ARTHROPLASTY Left 04/25/2018   Procedure: TOTAL HIP ARTHROPLASTY ANTERIOR  APPROACH;  Surgeon: Samson Frederic, MD;  Location: [facility];  Service: Orthopedics;  Laterality: Left;   TRIGGER FINGER RELEASE Right 2018   thumb   WISDOM TOOTH EXTRACTION  age 40    ROS: Review of Systems Negative except as stated above  PHYSICAL EXAM: BP 112/78 (BP Location: Left Arm, Patient Position: Sitting)   Pulse (!) 106   Temp 98 F (36.7 C) (Oral)   Resp 16   Ht 5\' 5"  (1.651 m)   Wt 226 lb (102.5 kg)   LMP 05/04/2012   SpO2 92%   BMI 37.61 kg/m   Physical Exam HENT:     Head: Normocephalic and atraumatic.     Nose: Nose normal.     Mouth/Throat:     Mouth: Mucous membranes are moist.     Pharynx: Oropharynx is clear.  Eyes:     Extraocular Movements: Extraocular movements intact.     Conjunctiva/sclera: Conjunctivae normal.     Pupils: Pupils are equal, round, and reactive to light.  Cardiovascular:     Rate and Rhythm: Tachycardia present.     Pulses: Normal pulses.     Heart sounds: Normal heart sounds.  Pulmonary:     Effort: Pulmonary effort is normal.     Breath sounds: Normal breath sounds.  Musculoskeletal:        General: Normal range of motion.     Cervical back: Normal range of motion and neck supple.  Neurological:     General: No focal deficit present.     Mental Status: She is alert and oriented to person, place, and time.  Psychiatric:        Mood and Affect: Mood normal.        Behavior: Behavior normal.     ASSESSMENT AND PLAN: 1. Primary hypertension (Primary) - Continue Amlodipine as prescribed.  - Routine screening.  - Counseled on blood pressure goal of less than 130/80, low-sodium, DASH diet, medication compliance, and 150 minutes of moderate intensity exercise per  week as tolerated. Counseled on medication adherence and adverse effects. - Follow-up with primary provider in 3 months or sooner if needed.  - amLODipine (NORVASC) 5 MG tablet; Take 1 tablet (5 mg total) by mouth daily.  Dispense: 90 tablet; Refill: 0 - Basic  Metabolic Panel  2. Hyperlipidemia, unspecified hyperlipidemia type - Continue Atorvastatin as prescribed. Counseled on medication adherence/adverse effects.  - Follow-up with primary provider in 3 months or sooner if needed.  - atorvastatin (LIPITOR) 20 MG tablet; Take 1 tablet (20 mg total) by mouth daily.  Dispense: 90 tablet; Refill: 0  3. Chronic asthma, unspecified asthma severity, unspecified whether complicated, unspecified whether persistent - Continue Albuterol inhaler as prescribed. Counseled on medication adherence/adverse effects.  - Follow-up with primary provider in 3 months or sooner if needed. - albuterol (VENTOLIN HFA) 108 (90 Base) MCG/ACT inhaler; Inhale 2 puffs into the lungs every 4 (four) hours as needed (wheezing and SOB).  Dispense: 8 g; Refill: 2   4. Encounter for weight management 5. BMI 37.0-37.9, adult - Continue Phentermine as prescribed. Counseled on medication adherence/adverse effects.  - Follow-up with primary provider in 4 weeks or sooner if needed.  - phentermine 37.5 MG capsule; Take 1 capsule (37.5 mg total) by mouth every morning.  Dispense: 30 capsule; Refill: 0   Patient was given the opportunity to ask questions.  Patient verbalized understanding of the plan and was able to repeat key elements of the plan. Patient was given clear instructions to go to Emergency Department or return to medical center if symptoms don't improve, worsen, or new problems develop.The patient verbalized understanding.   Orders Placed This Encounter  Procedures   Basic Metabolic Panel     Requested Prescriptions   Signed Prescriptions Disp Refills   amLODipine (NORVASC) 5 MG tablet 90 tablet 0    Sig: Take 1 tablet (5 mg total) by mouth daily.   albuterol (VENTOLIN HFA) 108 (90 Base) MCG/ACT inhaler 8 g 2    Sig: Inhale 2 puffs into the lungs every 4 (four) hours as needed (wheezing and SOB).   atorvastatin (LIPITOR) 20 MG tablet 90 tablet 0    Sig: Take 1  tablet (20 mg total) by mouth daily.   phentermine 37.5 MG capsule 30 capsule 0    Sig: Take 1 capsule (37.5 mg total) by mouth every morning.    Return in about 3 months (around 07/16/2023) for Follow-Up or next available chronic conditions and 4 weeks weight check.  Rema Fendt, NP

## 2023-04-19 ENCOUNTER — Other Ambulatory Visit: Payer: Self-pay | Admitting: Family

## 2023-04-19 ENCOUNTER — Other Ambulatory Visit: Payer: Self-pay | Admitting: Orthopedic Surgery

## 2023-04-19 ENCOUNTER — Encounter: Payer: Self-pay | Admitting: Family

## 2023-04-19 DIAGNOSIS — Z13228 Encounter for screening for other metabolic disorders: Secondary | ICD-10-CM

## 2023-04-19 LAB — BASIC METABOLIC PANEL
BUN/Creatinine Ratio: 13 (ref 9–23)
BUN: 14 mg/dL (ref 6–24)
CO2: 25 mmol/L (ref 20–29)
Calcium: 9.5 mg/dL (ref 8.7–10.2)
Chloride: 104 mmol/L (ref 96–106)
Creatinine, Ser: 1.11 mg/dL — ABNORMAL HIGH (ref 0.57–1.00)
Glucose: 99 mg/dL (ref 70–99)
Potassium: 4.2 mmol/L (ref 3.5–5.2)
Sodium: 142 mmol/L (ref 134–144)
eGFR: 57 mL/min/{1.73_m2} — ABNORMAL LOW (ref >59)

## 2023-04-30 ENCOUNTER — Telehealth: Admitting: Physician Assistant

## 2023-04-30 DIAGNOSIS — R051 Acute cough: Secondary | ICD-10-CM

## 2023-04-30 MED ORDER — PREDNISONE 20 MG PO TABS: ORAL_TABLET | ORAL | 0 refills | Status: AC

## 2023-04-30 MED ORDER — PSEUDOEPH-BROMPHEN-DM 30-2-10 MG/5ML PO SYRP: 5.0000 mL | ORAL_SOLUTION | Freq: Four times a day (QID) | ORAL | 0 refills | Status: DC | PRN

## 2023-04-30 MED ORDER — BENZONATATE 100 MG PO CAPS: ORAL_CAPSULE | ORAL | 0 refills | Status: DC

## 2023-04-30 NOTE — Progress Notes (Signed)
 Virtual Visit Consent   Darlene Crosby, you are scheduled for a virtual visit with a [facility] provider today. Just as with appointments in the office, your consent must be obtained to participate. Your consent will be active for this visit and any virtual visit you may have with one of our providers in the next 365 days. If you have a MyChart account, a copy of this consent can be sent to you electronically.  As this is a virtual visit, video technology does not allow for your provider to perform a traditional examination. This may limit your provider's ability to fully assess your condition. If your provider identifies any concerns that need to be evaluated in person or the need to arrange testing (such as labs, EKG, etc.), we will make arrangements to do so. Although advances in technology are sophisticated, we cannot ensure that it will always work on either your end or our end. If the connection with a video visit is poor, the visit may have to be switched to a telephone visit. With either a video or telephone visit, we are not always able to ensure that we have a secure connection.  By engaging in this virtual visit, you consent to the provision of healthcare and authorize for your insurance to be billed (if applicable) for the services provided during this visit. Depending on your insurance coverage, you may receive a charge related to this service.  I need to obtain your verbal consent now. Are you willing to proceed with your visit today? Darlene Crosby has provided verbal consent on 04/30/2023 for a virtual visit (video or telephone). Roney Jaffe, PA-C  Date: 04/30/2023 11:30 AM   Virtual Visit via Video Note   I, Darlene Crosby, connected with  Darlene Crosby  ([id number], 11-Aug-1965) on 04/30/23 at 11:30 AM EST by a video-enabled telemedicine application and verified that I am speaking with the correct person using two identifiers.  Location: Patient: Virtual Visit Location  Patient: Home Provider: Virtual Visit Location Provider: Home Office   I discussed the limitations of evaluation and management by telemedicine and the availability of in person appointments. The patient expressed understanding and agreed to proceed.    History of Present Illness: Darlene Crosby is a 58 y.o. who identifies as a female who was assigned female at birth, states that she was treated for the flu approximately 2 weeks ago.  States that she has had a lingering bothersome cough with green sputum.  States the cough is keeping her awake, is making her chest and back hurt as well.  States that she has been using her nebulizer once daily and using her albuterol inhaler as needed.  States otherwise she feels fine overall.  Is eating and drinking okay.  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 04/12/2022   Status post lumbar laminectomy 09/20/2021   Body mass index (BMI) 37.0-37.9, adult 03/14/2019   Lumbar stenosis without neurogenic claudication 03/14/2019   Hyperlipidemia 02/08/2019   Lumbar spondylosis 10/25/2018   Degeneration of lumbar intervertebral disc 10/01/2018   Surgical wound dehiscence 06/06/2018   Osteoarthritis of left hip 04/25/2018   Spinal stenosis of lumbar region 02/23/2018   Pain in left knee 01/10/2018   Trigger thumb of right hand 03/30/2017   Symptomatic mammary hypertrophy 11/28/2014   Dyspnea 03/26/2013   Asthma vs VCD  02/28/2013   Hypertensive disorder 06/07/2012  Pyogenic granuloma 02/15/2012    Allergies:  Allergies  Allergen Reactions   Pineapple Shortness Of Breath, Swelling and Hives    Swelling of tongue    Chocolate Hives   Coconut (Cocos Nucifera) Hives and Rash   Strawberry Extract Hives   Wheat Hives   Chocolate Flavoring Agent (Non-Screening) Other (See Comments) and Hives   Flavoring Agent (Non-Screening) Hives   Pineapple Flavoring Agent  (Non-Screening) Hives   Medications:  Current Outpatient Medications:    benzonatate (TESSALON) 100 MG capsule, Take 1-2 caps PO TID PRN, Disp: 20 capsule, Rfl: 0   predniSONE (DELTASONE) 20 MG tablet, Take 3 tablets (60 mg total) by mouth daily with breakfast for 2 days, THEN 2 tablets (40 mg total) daily with breakfast for 2 days, THEN 1 tablet (20 mg total) daily with breakfast for 2 days, THEN 0.5 tablets (10 mg total) daily with breakfast for 2 days., Disp: 13 tablet, Rfl: 0   acetaminophen (TYLENOL) 650 MG CR tablet, Take 1,300 mg by mouth every 8 (eight) hours as needed for pain., Disp: , Rfl:    albuterol (VENTOLIN HFA) 108 (90 Base) MCG/ACT inhaler, Inhale 2 puffs into the lungs every 4 (four) hours as needed (wheezing and SOB)., Disp: 8 g, Rfl: 2   amLODipine (NORVASC) 5 MG tablet, Take 1 tablet (5 mg total) by mouth daily., Disp: 90 tablet, Rfl: 0   Ascorbic Acid (VITAMIN C PO), Take 1 tablet by mouth daily., Disp: , Rfl:    atorvastatin (LIPITOR) 20 MG tablet, Take 1 tablet (20 mg total) by mouth daily., Disp: 90 tablet, Rfl: 0   brompheniramine-pseudoephedrine-DM 30-2-10 MG/5ML syrup, Take 5 mLs by mouth 4 (four) times daily as needed., Disp: 120 mL, Rfl: 0   Calcium-Magnesium-Zinc (CAL-MAG-ZINC PO), Take 1 tablet by mouth daily., Disp: , Rfl:    Cholecalciferol (VITAMIN D) 50 MCG (2000 UT) tablet, Take 2,000 Units by mouth daily., Disp: , Rfl:    diclofenac (VOLTAREN) 50 MG EC tablet, TAKE 1 TABLET BY MOUTH 3 TIMES  DAILY, Disp: 300 tablet, Rfl: 2   docusate sodium (COLACE) 100 MG capsule, Take 1 capsule (100 mg total) by mouth 2 (two) times daily., Disp: 40 capsule, Rfl: 0   ELDERBERRY PO, Take 1 tablet by mouth daily., Disp: , Rfl:    fluticasone (FLONASE) 50 MCG/ACT nasal spray, Place 1 spray into both nostrils daily., Disp: , Rfl:    hydrochlorothiazide (HYDRODIURIL) 25 MG tablet, Take by mouth., Disp: , Rfl:    HYDROcodone-acetaminophen (NORCO) 7.5-325 MG tablet, Take 1 tablet  by mouth every 4 (four) hours as needed for moderate pain., Disp: 40 tablet, Rfl: 0   ipratropium-albuterol (DUONEB) 0.5-2.5 (3) MG/3ML SOLN, Inhale 3 mLs into the lungs every 6 (six) hours as needed for shortness of breath., Disp: , Rfl:    levocetirizine (XYZAL) 5 MG tablet, TAKE 1 TABLET BY MOUTH ONCE DAILY IN THE EVENING, Disp: 30 tablet, Rfl: 0   levocetirizine (XYZAL) 5 MG tablet, Take 1 tablet (5 mg total) by mouth every evening., Disp: 30 tablet, Rfl: 0   phentermine 37.5 MG capsule, Take 1 capsule (37.5 mg total) by mouth every morning., Disp: 30 capsule, Rfl: 0   pregabalin (LYRICA) 75 MG capsule, TAKE 1 CAPSULE BY MOUTH TWICE  DAILY, Disp: 60 capsule, Rfl: 1   Probiotic Product (PROBIOTIC PO), Take 1 capsule by mouth daily., Disp: , Rfl:    Spacer/Aero-Holding Chambers DEVI, 1 Units by Does not apply route in the morning, at noon,  and at bedtime., Disp: 1 Units, Rfl: 0   Turmeric 500 MG CAPS, Take 500 mg by mouth daily., Disp: , Rfl:    vitamin B-12 (CYANOCOBALAMIN) 1000 MCG tablet, Take 1,000 mcg by mouth daily., Disp: , Rfl:   Observations/Objective: Patient is well-developed, well-nourished in no acute distress.  Resting comfortably  at home.  Head is normocephalic, atraumatic.  No labored breathing.  Speech is clear and coherent with logical content.  Patient is alert and oriented at baseline.    Assessment and Plan: 1. Acute cough - benzonatate (TESSALON) 100 MG capsule; Take 1-2 caps PO TID PRN  Dispense: 20 capsule; Refill: 0 - brompheniramine-pseudoephedrine-DM 30-2-10 MG/5ML syrup; Take 5 mLs by mouth 4 (four) times daily as needed.  Dispense: 120 mL; Refill: 0 - predniSONE (DELTASONE) 20 MG tablet; Take 3 tablets (60 mg total) by mouth daily with breakfast for 2 days, THEN 2 tablets (40 mg total) daily with breakfast for 2 days, THEN 1 tablet (20 mg total) daily with breakfast for 2 days, THEN 0.5 tablets (10 mg total) daily with breakfast for 2 days.  Dispense: 13  tablet; Refill: 0  Patient education given on supportive care, red flags given for prompt reevaluation.  Follow Up Instructions: I discussed the assessment and treatment plan with the patient. The patient was provided an opportunity to ask questions and all were answered. The patient agreed with the plan and demonstrated an understanding of the instructions.  A copy of instructions were sent to the patient via MyChart unless otherwise noted below.     The patient was advised to call back or seek an in-person evaluation if the symptoms worsen or if the condition fails to improve as anticipated.    Kasandra Knudsen Mayers, PA-C

## 2023-04-30 NOTE — Patient Instructions (Signed)
 Darlene Crosby, thank you for joining Roney Jaffe, PA-C for today's virtual visit.  While this provider is not your primary care provider (PCP), if your PCP is located in our provider database this encounter information will be shared with them immediately following your visit.   A [facility] MyChart account gives you access to today's visit and all your visits, tests, and labs performed at [facility] " click here if you don't have a [facility] MyChart account or go to [email or web address]  Consent: (Patient) Darlene Crosby provided verbal consent for this virtual visit at the beginning of the encounter.  Current Medications:  Current Outpatient Medications:    benzonatate (TESSALON) 100 MG capsule, Take 1-2 caps PO TID PRN, Disp: 20 capsule, Rfl: 0   predniSONE (DELTASONE) 20 MG tablet, Take 3 tablets (60 mg total) by mouth daily with breakfast for 2 days, THEN 2 tablets (40 mg total) daily with breakfast for 2 days, THEN 1 tablet (20 mg total) daily with breakfast for 2 days, THEN 0.5 tablets (10 mg total) daily with breakfast for 2 days., Disp: 13 tablet, Rfl: 0   acetaminophen (TYLENOL) 650 MG CR tablet, Take 1,300 mg by mouth every 8 (eight) hours as needed for pain., Disp: , Rfl:    albuterol (VENTOLIN HFA) 108 (90 Base) MCG/ACT inhaler, Inhale 2 puffs into the lungs every 4 (four) hours as needed (wheezing and SOB)., Disp: 8 g, Rfl: 2   amLODipine (NORVASC) 5 MG tablet, Take 1 tablet (5 mg total) by mouth daily., Disp: 90 tablet, Rfl: 0   Ascorbic Acid (VITAMIN C PO), Take 1 tablet by mouth daily., Disp: , Rfl:    atorvastatin (LIPITOR) 20 MG tablet, Take 1 tablet (20 mg total) by mouth daily., Disp: 90 tablet, Rfl: 0   brompheniramine-pseudoephedrine-DM 30-2-10 MG/5ML syrup, Take 5 mLs by mouth 4 (four) times daily as needed., Disp: 120 mL, Rfl: 0   Calcium-Magnesium-Zinc (CAL-MAG-ZINC PO), Take 1 tablet by mouth daily., Disp: , Rfl:    Cholecalciferol (VITAMIN  D) 50 MCG (2000 UT) tablet, Take 2,000 Units by mouth daily., Disp: , Rfl:    diclofenac (VOLTAREN) 50 MG EC tablet, TAKE 1 TABLET BY MOUTH 3 TIMES  DAILY, Disp: 300 tablet, Rfl: 2   docusate sodium (COLACE) 100 MG capsule, Take 1 capsule (100 mg total) by mouth 2 (two) times daily., Disp: 40 capsule, Rfl: 0   ELDERBERRY PO, Take 1 tablet by mouth daily., Disp: , Rfl:    fluticasone (FLONASE) 50 MCG/ACT nasal spray, Place 1 spray into both nostrils daily., Disp: , Rfl:    hydrochlorothiazide (HYDRODIURIL) 25 MG tablet, Take by mouth., Disp: , Rfl:    HYDROcodone-acetaminophen (NORCO) 7.5-325 MG tablet, Take 1 tablet by mouth every 4 (four) hours as needed for moderate pain., Disp: 40 tablet, Rfl: 0   ipratropium-albuterol (DUONEB) 0.5-2.5 (3) MG/3ML SOLN, Inhale 3 mLs into the lungs every 6 (six) hours as needed for shortness of breath., Disp: , Rfl:    levocetirizine (XYZAL) 5 MG tablet, TAKE 1 TABLET BY MOUTH ONCE DAILY IN THE EVENING, Disp: 30 tablet, Rfl: 0   levocetirizine (XYZAL) 5 MG tablet, Take 1 tablet (5 mg total) by mouth every evening., Disp: 30 tablet, Rfl: 0   phentermine 37.5 MG capsule, Take 1 capsule (37.5 mg total) by mouth every morning., Disp: 30 capsule, Rfl: 0   pregabalin (LYRICA) 75 MG capsule, TAKE 1 CAPSULE BY MOUTH TWICE  DAILY, Disp: 60 capsule, Rfl: 1  Probiotic Product (PROBIOTIC PO), Take 1 capsule by mouth daily., Disp: , Rfl:    Spacer/Aero-Holding Chambers DEVI, 1 Units by Does not apply route in the morning, at noon, and at bedtime., Disp: 1 Units, Rfl: 0   Turmeric 500 MG CAPS, Take 500 mg by mouth daily., Disp: , Rfl:    vitamin B-12 (CYANOCOBALAMIN) 1000 MCG tablet, Take 1,000 mcg by mouth daily., Disp: , Rfl:    Medications ordered in this encounter:  Meds ordered this encounter  Medications   benzonatate (TESSALON) 100 MG capsule    Sig: Take 1-2 caps PO TID PRN    Dispense:  20 capsule    Refill:  0    Supervising Provider:   Merrilee Jansky  [[id number]]   brompheniramine-pseudoephedrine-DM 30-2-10 MG/5ML syrup    Sig: Take 5 mLs by mouth 4 (four) times daily as needed.    Dispense:  120 mL    Refill:  0    Supervising Provider:   Merrilee Jansky [[id number]]   predniSONE (DELTASONE) 20 MG tablet    Sig: Take 3 tablets (60 mg total) by mouth daily with breakfast for 2 days, THEN 2 tablets (40 mg total) daily with breakfast for 2 days, THEN 1 tablet (20 mg total) daily with breakfast for 2 days, THEN 0.5 tablets (10 mg total) daily with breakfast for 2 days.    Dispense:  13 tablet    Refill:  0    Supervising Provider:   Merrilee Jansky [[id number]]     *If you need refills on other medications prior to your next appointment, please contact your pharmacy*  Follow-Up: Call back or seek an in-person evaluation if the symptoms worsen or if the condition fails to improve as anticipated.  [facility] [phone]  Other Instructions Cough, Adult Coughing is a reflex that clears your throat and airways (respiratory system). It helps heal and protect your lungs. It is normal to cough from time to time. A cough that happens with other symptoms or that lasts a long time may be a sign of a condition that needs treatment. A short-term (acute) cough may only last 2-3 weeks. A long-term (chronic) cough may last 8 or more weeks. Coughing is often caused by: Diseases, such as: An infection of the respiratory system. Asthma or other heart or lung diseases. Gastroesophageal reflux. This is when acid comes back up from the stomach. Breathing in things that irritate your lungs. Allergies. Postnasal drip. This is when mucus runs down the back of your throat. Smoking. Some medicines. Follow these instructions at home: Medicines Take over-the-counter and prescription medicines only as told by your health care provider. Talk with your provider before you take cough medicine (cough suppressants). Eating and drinking Do not drink  alcohol. Avoid caffeine. Drink enough fluid to keep your pee (urine) pale yellow. Lifestyle Avoid cigarette smoke. Do not use any products that contain nicotine or tobacco. These products include cigarettes, chewing tobacco, and vaping devices, such as e-cigarettes. If you need help quitting, ask your provider. Avoid things that make you cough. These may include perfumes, candles, cleaning products, or campfire smoke. General instructions  Watch for any changes to your cough. Tell your provider about them. Always cover your mouth when you cough. If the air is dry in your bedroom or home, use a cool mist vaporizer or humidifier. If your cough is worse at night, try to sleep in a semi-upright position. Rest as needed. Contact a health care  provider if: You have new symptoms, or your symptoms get worse. You cough up pus. You have a fever that does not go away or a cough that does not get better after 2-3 weeks. You cannot control your cough with medicine, and you are losing sleep. You have pain that gets worse or is not helped with medicine. You lose weight for no clear reason. You have night sweats. Get help right away if: You cough up blood. You have trouble breathing. Your heart is beating very fast. These symptoms may be an emergency. Get help right away. Call 911. Do not wait to see if the symptoms will go away. Do not drive yourself to the hospital. This information is not intended to replace advice given to you by your health care provider. Make sure you discuss any questions you have with your health care provider. Document Revised: 10/15/2021 Document Reviewed: 10/15/2021 Elsevier Patient Education  2024 Elsevier Inc.   If you have been instructed to have an in-person evaluation today at a local Urgent Care facility, please use the link below. It will take you to a list of all of our available [facility], including address, phone number and hours of operation.  Please do not delay care.  [facility]  If you or a family member do not have a primary care provider, use the link below to schedule a visit and establish care. When you choose a [facility] primary care physician or advanced practice provider, you gain a long-term partner in health. Find a Primary Care Provider  Learn more about [facility]'s in-office and virtual care options: [place] - Get Care Now

## 2023-05-19 ENCOUNTER — Other Ambulatory Visit: Payer: Self-pay | Admitting: Family

## 2023-05-19 ENCOUNTER — Ambulatory Visit (INDEPENDENT_AMBULATORY_CARE_PROVIDER_SITE_OTHER): Payer: 59 | Admitting: Family

## 2023-05-19 ENCOUNTER — Encounter: Payer: Self-pay | Admitting: Family

## 2023-05-19 ENCOUNTER — Other Ambulatory Visit: Payer: Self-pay

## 2023-05-19 ENCOUNTER — Other Ambulatory Visit: Payer: Self-pay | Admitting: *Deleted

## 2023-05-19 VITALS — BP 139/89 | HR 89 | Temp 98.1°F | Resp 16 | Ht 65.0 in | Wt 223.0 lb

## 2023-05-19 DIAGNOSIS — Z6837 Body mass index (BMI) 37.0-37.9, adult: Secondary | ICD-10-CM | POA: Diagnosis not present

## 2023-05-19 DIAGNOSIS — Z7689 Persons encountering health services in other specified circumstances: Secondary | ICD-10-CM

## 2023-05-19 DIAGNOSIS — E669 Obesity, unspecified: Secondary | ICD-10-CM | POA: Diagnosis not present

## 2023-05-19 DIAGNOSIS — J3089 Other allergic rhinitis: Secondary | ICD-10-CM

## 2023-05-19 DIAGNOSIS — Z13228 Encounter for screening for other metabolic disorders: Secondary | ICD-10-CM

## 2023-05-19 MED ORDER — PHENTERMINE HCL 37.5 MG PO CAPS: 37.5000 mg | ORAL_CAPSULE | ORAL | 0 refills | Status: DC

## 2023-05-19 MED ORDER — LEVOCETIRIZINE DIHYDROCHLORIDE 5 MG PO TABS: 5.0000 mg | ORAL_TABLET | Freq: Every evening | ORAL | 0 refills | Status: DC

## 2023-05-19 NOTE — Telephone Encounter (Signed)
 Duplicate request. Rx reordered today by PCP Requested Prescriptions  Pending Prescriptions Disp Refills   levocetirizine (XYZAL) 5 MG tablet [Pharmacy Med Name: Levocetirizine Dihydrochloride 5 MG Oral Tablet] 30 tablet 0    Sig: TAKE 1 TABLET BY MOUTH ONCE DAILY IN THE EVENING     Ear, Nose, and Throat:  Antihistamines - levocetirizine dihydrochloride Failed - 05/19/2023  4:54 PM      Failed - Cr in normal range and within 360 days    Creat  Date Value Ref Range Status  12/27/2014 0.96 0.50 - 1.05 mg/dL Final   Creatinine, Ser  Date Value Ref Range Status  04/18/2023 1.11 (H) 0.57 - 1.00 mg/dL Final         Passed - eGFR is 10 or above and within 360 days    GFR calc Af Amer  Date Value Ref Range Status  06/06/2018 >60 >60 mL/min Final   GFR, Estimated  Date Value Ref Range Status  09/15/2021 >60 >60 mL/min Final    Comment:    (NOTE) Calculated using the CKD-EPI Creatinine Equation (2021)    eGFR  Date Value Ref Range Status  04/18/2023 57 (L) >59 mL/min/1.73 Final         Passed - Valid encounter within last 12 months    Recent Outpatient Visits           Today Encounter for weight management   [facility], [place], NP   1 month ago Primary hypertension   [facility], [place], NP   3 months ago Encounter for weight management   [facility], [place], NP   4 months ago Primary hypertension   [facility], Amy J, NP   7 months ago Encounter for weight management   [facility], Salomon Fick, NP       Future Appointments             In 2 months Christell Constant Tyrone Apple, MD [facility]

## 2023-05-19 NOTE — Progress Notes (Signed)
 Complete

## 2023-05-19 NOTE — Progress Notes (Signed)
 Patient ID: Darlene Crosby, female    DOB: [birth year]  MRN: [id number]  CC: Weight Check  Subjective: Darlene Crosby is a 58 y.o. female who presents for weight check.  Her concerns today include:  - Doing well on Phentermine, no issues/concerns.  - Lab to recheck kidney function. Denies red flag symptoms.  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 04/12/2022   Status post lumbar laminectomy 09/20/2021   Body mass index (BMI) 37.0-37.9, adult 03/14/2019   Lumbar stenosis without neurogenic claudication 03/14/2019   Hyperlipidemia 02/08/2019   Lumbar spondylosis 10/25/2018   Degeneration of lumbar intervertebral disc 10/01/2018   Surgical wound dehiscence 06/06/2018   Osteoarthritis of left hip 04/25/2018   Spinal stenosis of lumbar region 02/23/2018   Pain in left knee 01/10/2018   Trigger thumb of right hand 03/30/2017   Symptomatic mammary hypertrophy 11/28/2014   Dyspnea 03/26/2013   Asthma vs VCD  02/28/2013   Hypertensive disorder 06/07/2012   Pyogenic granuloma 02/15/2012     Current Outpatient Medications on File Prior to Visit  Medication Sig Dispense Refill   acetaminophen (TYLENOL) 650 MG CR tablet Take 1,300 mg by mouth every 8 (eight) hours as needed for pain.     albuterol (VENTOLIN HFA) 108 (90 Base) MCG/ACT inhaler Inhale 2 puffs into the lungs every 4 (four) hours as needed (wheezing and SOB). 8 g 2   amLODipine (NORVASC) 5 MG tablet Take 1 tablet (5 mg total) by mouth daily. 90 tablet 0   Ascorbic Acid (VITAMIN C PO) Take 1 tablet by mouth daily.     atorvastatin (LIPITOR) 20 MG tablet Take 1 tablet (20 mg total) by mouth daily. 90 tablet 0   Calcium-Magnesium-Zinc (CAL-MAG-ZINC PO) Take 1 tablet by mouth daily.     Cholecalciferol (VITAMIN D) 50 MCG (2000 UT) tablet Take 2,000 Units by mouth daily.     diclofenac (VOLTAREN) 50 MG EC tablet TAKE 1 TABLET BY MOUTH 3 TIMES  DAILY 300  tablet 2   ELDERBERRY PO Take 1 tablet by mouth daily.     fluticasone (FLONASE) 50 MCG/ACT nasal spray Place 1 spray into both nostrils daily.     hydrochlorothiazide (HYDRODIURIL) 25 MG tablet Take by mouth.     ipratropium-albuterol (DUONEB) 0.5-2.5 (3) MG/3ML SOLN Inhale 3 mLs into the lungs every 6 (six) hours as needed for shortness of breath.     levocetirizine (XYZAL) 5 MG tablet TAKE 1 TABLET BY MOUTH ONCE DAILY IN THE EVENING 30 tablet 0   levocetirizine (XYZAL) 5 MG tablet Take 1 tablet (5 mg total) by mouth every evening. 30 tablet 0   pregabalin (LYRICA) 75 MG capsule TAKE 1 CAPSULE BY MOUTH TWICE  DAILY 60 capsule 1   Probiotic Product (PROBIOTIC PO) Take 1 capsule by mouth daily.     Spacer/Aero-Holding Chambers DEVI 1 Units by Does not apply route in the morning, at noon, and at bedtime. 1 Units 0   Turmeric 500 MG CAPS Take 500 mg by mouth daily.     vitamin B-12 (CYANOCOBALAMIN) 1000 MCG tablet Take 1,000 mcg by mouth daily.     docusate sodium (COLACE) 100 MG capsule Take 1 capsule (100 mg total) by mouth 2 (two) times daily. 40 capsule 0   HYDROcodone-acetaminophen (NORCO) 7.5-325 MG tablet Take 1 tablet by mouth every 4 (four) hours as needed for moderate pain. 40 tablet 0   No current facility-administered medications  on file prior to visit.    Allergies  Allergen Reactions   Pineapple Shortness Of Breath, Swelling and Hives    Swelling of tongue    Chocolate Hives   Coconut (Cocos Nucifera) Hives and Rash   Strawberry Extract Hives   Wheat Hives   Chocolate Flavoring Agent (Non-Screening) Other (See Comments) and Hives   Flavoring Agent (Non-Screening) Hives   Pineapple Flavoring Agent (Non-Screening) Hives    Social History   Socioeconomic History   Marital status: Widowed    Spouse name: Not on file   Number of children: Not on file   Years of education: Not on file   Highest education level: Not on file  Occupational History   Not on file  Tobacco  Use   Smoking status: Never    Passive exposure: Never   Smokeless tobacco: Never  Vaping Use   Vaping status: Never Used  Substance and Sexual Activity   Alcohol use: Not Currently    Alcohol/week: 1.0 standard drink of alcohol    Types: 1 Glasses of wine per week   Drug use: Never   Sexual activity: Not on file  Other Topics Concern   Not on file  Social History Narrative   Not on file   Social Drivers of Health   Financial Resource Strain: Low Risk  (04/13/2023)   Overall Financial Resource Strain (CARDIA)    Difficulty of Paying Living Expenses: Not hard at all  Food Insecurity: No Food Insecurity (04/13/2023)   Hunger Vital Sign    Worried About Running Out of Food in the Last Year: Never true    Ran Out of Food in the Last Year: Never true  Transportation Needs: No Transportation Needs (04/13/2023)   PRAPARE - Administrator, Civil Service (Medical): No    Lack of Transportation (Non-Medical): No  Physical Activity: Sufficiently Active (04/13/2023)   Exercise Vital Sign    Days of Exercise per Week: 3 days    Minutes of Exercise per Session: 90 min  Recent Concern: Physical Activity - Insufficiently Active (02/08/2023)   Exercise Vital Sign    Days of Exercise per Week: 3 days    Minutes of Exercise per Session: 30 min  Stress: No Stress Concern Present (04/13/2023)   [employer] of Occupational Health - Occupational Stress Questionnaire    Feeling of Stress : Not at all  Social Connections: Socially Isolated (04/13/2023)   Social Connection and Isolation Panel [NHANES]    Frequency of Communication with Friends and Family: More than three times a week    Frequency of Social Gatherings with Friends and Family: Twice a week    Attends Religious Services: Never    Database administrator or Organizations: No    Attends Banker Meetings: Never    Marital Status: Widowed  Intimate Partner Violence: Not At Risk (04/13/2023)   Humiliation,  Afraid, Rape, and Kick questionnaire    Fear of Current or Ex-Partner: No    Emotionally Abused: No    Physically Abused: No    Sexually Abused: No    Family History  Problem Relation Age of Onset   Hypertension Mother    Asthma Mother    Allergies Mother    Breast cancer Sister    Heart disease Sister    Hypertension Sister    Stroke Sister    Diabetes Sister    Colon cancer Neg Hx    Esophageal cancer Neg Hx  Stomach cancer Neg Hx    Pancreatic cancer Neg Hx     Past Surgical History:  Procedure Laterality Date   ANTERIOR HIP REVISION Left 06/06/2018   Procedure: LEFT HIP WOUND DEHISCENCE POSSIBLE HEAD AND LINER EXCHANGE;  Surgeon: Samson Frederic, MD;  Location: [facility];  Service: Orthopedics;  Laterality: Left;   COLONOSCOPY  2015   DIAGNOSTIC LAPAROSCOPY     tubal preg-took ovary and tube   LAPAROSCOPY FOR ECTOPIC PREGNANCY  1990s   LEFT SALPINGECTOMY AND RIGHT OOPHORECTOMY FOR ABNORMALITY   LUMBAR LAMINECTOMY N/A 09/20/2021   Procedure: L3-4 AND L4-5 CENTRAL LUMBAR LAMINECTOMIES;  Surgeon: Kerrin Champagne, MD;  Location: [facility];  Service: Orthopedics;  Laterality: N/A;   MASS EXCISION  02/15/2012   Procedure: EXCISION MASS;  Surgeon: Marlowe Shores, MD;  Location: [facility];  Service: Orthopedics;  Laterality: Right;  Excision of Right Small Volar Mass   MYELOGRAM     TONSILLECTOMY  age 49   TOTAL HIP ARTHROPLASTY Left 04/25/2018   Procedure: TOTAL HIP ARTHROPLASTY ANTERIOR APPROACH;  Surgeon: Samson Frederic, MD;  Location: [facility];  Service: Orthopedics;  Laterality: Left;   TRIGGER FINGER RELEASE Right 2018   thumb   WISDOM TOOTH EXTRACTION  age 3    ROS: Review of Systems Negative except as stated above  PHYSICAL EXAM: BP 139/89   Pulse 89   Temp 98.1 F (36.7 C) (Oral)   Resp 16   Ht 5\' 5"  (1.651 m)   Wt 223 lb (101.2 kg)   LMP 05/04/2012   SpO2 92%   BMI 37.11 kg/m   Physical Exam HENT:     Head: Normocephalic and  atraumatic.     Nose: Nose normal.     Mouth/Throat:     Mouth: Mucous membranes are moist.     Pharynx: Oropharynx is clear.  Eyes:     Extraocular Movements: Extraocular movements intact.     Conjunctiva/sclera: Conjunctivae normal.     Pupils: Pupils are equal, round, and reactive to light.  Cardiovascular:     Rate and Rhythm: Normal rate and regular rhythm.     Pulses: Normal pulses.     Heart sounds: Normal heart sounds.  Pulmonary:     Effort: Pulmonary effort is normal.     Breath sounds: Normal breath sounds.  Musculoskeletal:        General: Normal range of motion.     Cervical back: Normal range of motion and neck supple.  Neurological:     General: No focal deficit present.     Mental Status: She is alert and oriented to person, place, and time.  Psychiatric:        Mood and Affect: Mood normal.        Behavior: Behavior normal.     ASSESSMENT AND PLAN: 1. Encounter for weight management (Primary) 2. BMI 37.0-37.9, adult - Continue Phentermine as prescribed. Counseled on medication adherence/adverse effects.  - I did check the [facility] prescription drug database.  - Follow-up with primary provider in 4 weeks or sooner if needed. - phentermine 37.5 MG capsule; Take 1 capsule (37.5 mg total) by mouth every morning.  Dispense: 30 capsule; Refill: 0  3. Screening for metabolic disorder - Routine screening.  - Basic Metabolic Panel   Patient was given the opportunity to ask questions.  Patient verbalized understanding of the plan and was able to repeat key elements of the plan. Patient was given clear instructions to go to  Emergency Department or return to medical center if symptoms don't improve, worsen, or new problems develop.The patient verbalized understanding.    Requested Prescriptions   Signed Prescriptions Disp Refills   phentermine 37.5 MG capsule 30 capsule 0    Sig: Take 1 capsule (37.5 mg total) by mouth every morning.    Return in about 4  weeks (around 06/16/2023) for Follow-Up or next available weight check.  Rema Fendt, NP

## 2023-05-20 LAB — BASIC METABOLIC PANEL
BUN/Creatinine Ratio: 14 (ref 9–23)
BUN: 13 mg/dL (ref 6–24)
CO2: 24 mmol/L (ref 20–29)
Calcium: 9 mg/dL (ref 8.7–10.2)
Chloride: 103 mmol/L (ref 96–106)
Creatinine, Ser: 0.9 mg/dL (ref 0.57–1.00)
Glucose: 82 mg/dL (ref 70–99)
Potassium: 4 mmol/L (ref 3.5–5.2)
Sodium: 142 mmol/L (ref 134–144)
eGFR: 74 mL/min/{1.73_m2} (ref >59)

## 2023-05-22 ENCOUNTER — Encounter: Payer: Self-pay | Admitting: Family

## 2023-06-05 ENCOUNTER — Other Ambulatory Visit: Payer: Self-pay | Admitting: Orthopedic Surgery

## 2023-06-23 ENCOUNTER — Encounter: Payer: Self-pay | Admitting: Family

## 2023-06-23 ENCOUNTER — Ambulatory Visit (INDEPENDENT_AMBULATORY_CARE_PROVIDER_SITE_OTHER): Admitting: Family

## 2023-06-23 VITALS — BP 130/86 | HR 93 | Temp 97.7°F | Resp 18 | Ht 65.0 in | Wt 229.0 lb

## 2023-06-23 DIAGNOSIS — E669 Obesity, unspecified: Secondary | ICD-10-CM | POA: Diagnosis not present

## 2023-06-23 DIAGNOSIS — Z7689 Persons encountering health services in other specified circumstances: Secondary | ICD-10-CM | POA: Diagnosis not present

## 2023-06-23 DIAGNOSIS — Z6838 Body mass index (BMI) 38.0-38.9, adult: Secondary | ICD-10-CM | POA: Diagnosis not present

## 2023-06-23 MED ORDER — PHENTERMINE HCL 37.5 MG PO CAPS: 37.5000 mg | ORAL_CAPSULE | ORAL | 0 refills | Status: DC

## 2023-06-23 NOTE — Progress Notes (Signed)
 4 week follow up for weight check

## 2023-06-23 NOTE — Progress Notes (Signed)
 Patient ID: Darlene Crosby, female    DOB: 1965/04/06  MRN: [id number]  CC: Weight Check   Subjective: Darlene Crosby is a 58 y.o. female who presents for weight check.   Her concerns today include:  Doing well on Phentermine , no issues/concerns. She watches what she eats.  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 04/12/2022   Status post lumbar laminectomy 09/20/2021   Body mass index (BMI) 37.0-37.9, adult 03/14/2019   Lumbar stenosis without neurogenic claudication 03/14/2019   Hyperlipidemia 02/08/2019   Lumbar spondylosis 10/25/2018   Degeneration of lumbar intervertebral disc 10/01/2018   Surgical wound dehiscence 06/06/2018   Osteoarthritis of left hip 04/25/2018   Spinal stenosis of lumbar region 02/23/2018   Pain in left knee 01/10/2018   Trigger thumb of right hand 03/30/2017   Symptomatic mammary hypertrophy 11/28/2014   Dyspnea 03/26/2013   Asthma vs VCD  02/28/2013   Hypertensive disorder 06/07/2012   Pyogenic granuloma 02/15/2012     Current Outpatient Medications on File Prior to Visit  Medication Sig Dispense Refill   acetaminophen  (TYLENOL ) 650 MG CR tablet Take 1,300 mg by mouth every 8 (eight) hours as needed for pain.     albuterol  (VENTOLIN  HFA) 108 (90 Base) MCG/ACT inhaler Inhale 2 puffs into the lungs every 4 (four) hours as needed (wheezing and SOB). 8 g 2   amLODipine  (NORVASC ) 5 MG tablet Take 1 tablet (5 mg total) by mouth daily. 90 tablet 0   Ascorbic Acid  (VITAMIN C  PO) Take 1 tablet by mouth daily.     atorvastatin  (LIPITOR) 20 MG tablet Take 1 tablet (20 mg total) by mouth daily. 90 tablet 0   Calcium -Magnesium-Zinc (CAL-MAG-ZINC PO) Take 1 tablet by mouth daily.     Cholecalciferol  (VITAMIN D ) 50 MCG (2000 UT) tablet Take 2,000 Units by mouth daily.     diclofenac  (VOLTAREN ) 50 MG EC tablet TAKE 1 TABLET BY MOUTH 3 TIMES  DAILY 300 tablet 2   ELDERBERRY PO Take 1  tablet by mouth daily.     fluticasone  (FLONASE ) 50 MCG/ACT nasal spray Place 1 spray into both nostrils daily.     hydrochlorothiazide  (HYDRODIURIL ) 25 MG tablet Take by mouth.     ipratropium-albuterol  (DUONEB) 0.5-2.5 (3) MG/3ML SOLN Inhale 3 mLs into the lungs every 6 (six) hours as needed for shortness of breath.     levocetirizine (XYZAL ) 5 MG tablet Take 1 tablet (5 mg total) by mouth every evening. 90 tablet 0   methocarbamol  (ROBAXIN ) 500 MG tablet TAKE 1 TABLET BY MOUTH EVERY 6  HOURS AS NEEDED (MUSCLE SPASMS,  PAIN). 50 tablet 2   pregabalin  (LYRICA ) 75 MG capsule TAKE 1 CAPSULE BY MOUTH TWICE  DAILY 60 capsule 1   Probiotic Product (PROBIOTIC PO) Take 1 capsule by mouth daily.     Spacer/Aero-Holding Chambers DEVI 1 Units by Does not apply route in the morning, at noon, and at bedtime. 1 Units 0   Turmeric 500 MG CAPS Take 500 mg by mouth daily.     vitamin B-12 (CYANOCOBALAMIN ) 1000 MCG tablet Take 1,000 mcg by mouth daily.     docusate sodium  (COLACE) 100 MG capsule Take 1 capsule (100 mg total) by mouth 2 (two) times daily. 40 capsule 0   HYDROcodone -acetaminophen  (NORCO) 7.5-325 MG tablet Take 1 tablet by mouth every 4 (four) hours as needed for moderate pain. 40 tablet 0   levocetirizine (XYZAL )  5 MG tablet Take 1 tablet (5 mg total) by mouth every evening. (Patient not taking: Reported on 06/23/2023) 30 tablet 0   No current facility-administered medications on file prior to visit.    Allergies  Allergen Reactions   Pineapple Shortness Of Breath, Swelling and Hives    Swelling of tongue    Chocolate Hives   Coconut (Cocos Nucifera) Hives and Rash   Strawberry Extract Hives   Wheat Hives   Chocolate Flavoring Agent (Non-Screening) Other (See Comments) and Hives   Flavoring Agent (Non-Screening) Hives   Pineapple Flavoring Agent (Non-Screening) Hives    Social History   Socioeconomic History   Marital status: Widowed    Spouse name: Not on file   Number of children:  Not on file   Years of education: Not on file   Highest education level: Associate degree: occupational, Scientist, product/process development, or vocational program  Occupational History   Not on file  Tobacco Use   Smoking status: Never    Passive exposure: Never   Smokeless tobacco: Never  Vaping Use   Vaping status: Never Used  Substance and Sexual Activity   Alcohol  use: Not Currently    Alcohol /week: 1.0 standard drink of alcohol     Types: 1 Glasses of wine per week   Drug use: Never   Sexual activity: Not on file  Other Topics Concern   Not on file  Social History Narrative   Not on file   Social Drivers of Health   Financial Resource Strain: Low Risk  (06/21/2023)   Overall Financial Resource Strain (CARDIA)    Difficulty of Paying Living Expenses: Not hard at all  Food Insecurity: No Food Insecurity (06/21/2023)   Hunger Vital Sign    Worried About Running Out of Food in the Last Year: Never true    Ran Out of Food in the Last Year: Never true  Transportation Needs: No Transportation Needs (06/21/2023)   PRAPARE - Administrator, Civil Service (Medical): No    Lack of Transportation (Non-Medical): No  Physical Activity: Sufficiently Active (06/21/2023)   Exercise Vital Sign    Days of Exercise per Week: 5 days    Minutes of Exercise per Session: 40 min  Stress: No Stress Concern Present (06/21/2023)   [employer] of Occupational Health - Occupational Stress Questionnaire    Feeling of Stress : Not at all  Social Connections: Moderately Isolated (06/21/2023)   Social Connection and Isolation Panel [NHANES]    Frequency of Communication with Friends and Family: More than three times a week    Frequency of Social Gatherings with Friends and Family: Once a week    Attends Religious Services: More than 4 times per year    Active Member of [employer] or Organizations: No    Attends Banker Meetings: Never    Marital Status: Widowed  Intimate Partner Violence: Not At Risk  (04/13/2023)   Humiliation, Afraid, Rape, and Kick questionnaire    Fear of Current or Ex-Partner: No    Emotionally Abused: No    Physically Abused: No    Sexually Abused: No    Family History  Problem Relation Age of Onset   Hypertension Mother    Asthma Mother    Allergies Mother    Breast cancer Sister    Heart disease Sister    Hypertension Sister    Stroke Sister    Diabetes Sister    Colon cancer Neg Hx    Esophageal cancer Neg Hx  Stomach cancer Neg Hx    Pancreatic cancer Neg Hx     Past Surgical History:  Procedure Laterality Date   ANTERIOR HIP REVISION Left 06/06/2018   Procedure: LEFT HIP WOUND DEHISCENCE POSSIBLE HEAD AND LINER EXCHANGE;  Surgeon: Adonica Hoose, MD;  Location: [facility];  Service: Orthopedics;  Laterality: Left;   COLONOSCOPY  2015   DIAGNOSTIC LAPAROSCOPY     tubal preg-took ovary and tube   LAPAROSCOPY FOR ECTOPIC PREGNANCY  1990s   LEFT SALPINGECTOMY AND RIGHT OOPHORECTOMY FOR ABNORMALITY   LUMBAR LAMINECTOMY N/A 09/20/2021   Procedure: L3-4 AND L4-5 CENTRAL LUMBAR LAMINECTOMIES;  Surgeon: Alphonso Jean, MD;  Location: [facility];  Service: Orthopedics;  Laterality: N/A;   MASS EXCISION  02/15/2012   Procedure: EXCISION MASS;  Surgeon: Hedy Living, MD;  Location: [facility];  Service: Orthopedics;  Laterality: Right;  Excision of Right Small Volar Mass   MYELOGRAM     TONSILLECTOMY  age 10   TOTAL HIP ARTHROPLASTY Left 04/25/2018   Procedure: TOTAL HIP ARTHROPLASTY ANTERIOR APPROACH;  Surgeon: Adonica Hoose, MD;  Location: [facility];  Service: Orthopedics;  Laterality: Left;   TRIGGER FINGER RELEASE Right 2018   thumb   WISDOM TOOTH EXTRACTION  age 69    ROS: Review of Systems Negative except as stated above  PHYSICAL EXAM: BP 130/86   Pulse 93   Temp 97.7 F (36.5 C) (Oral)   Resp 18   Ht 5\' 5"  (1.651 m)   Wt 229 lb (103.9 kg)   LMP 05/04/2012   SpO2 93%   BMI 38.11 kg/m   Wt Readings from Last 3  Encounters:  06/23/23 229 lb (103.9 kg)  05/19/23 223 lb (101.2 kg)  04/18/23 226 lb (102.5 kg)   Physical Exam HENT:     Head: Normocephalic and atraumatic.     Nose: Nose normal.     Mouth/Throat:     Mouth: Mucous membranes are moist.     Pharynx: Oropharynx is clear.  Eyes:     Extraocular Movements: Extraocular movements intact.     Conjunctiva/sclera: Conjunctivae normal.     Pupils: Pupils are equal, round, and reactive to light.  Cardiovascular:     Rate and Rhythm: Normal rate and regular rhythm.     Pulses: Normal pulses.     Heart sounds: Normal heart sounds.  Pulmonary:     Effort: Pulmonary effort is normal.     Breath sounds: Normal breath sounds.  Musculoskeletal:        General: Normal range of motion.     Cervical back: Normal range of motion and neck supple.  Neurological:     General: No focal deficit present.     Mental Status: She is alert and oriented to person, place, and time.  Psychiatric:        Mood and Affect: Mood normal.        Behavior: Behavior normal.    ASSESSMENT AND PLAN: 1. Encounter for weight management (Primary) 2. BMI 38.0-38.9,adult - Continue Phentermine  as prescribed. Counseled on medication adherence/adverse effects.  - Follow-up with primary provider in 4 weeks or sooner if needed. - phentermine  37.5 MG capsule; Take 1 capsule (37.5 mg total) by mouth every morning.  Dispense: 30 capsule; Refill: 0   Patient was given the opportunity to ask questions.  Patient verbalized understanding of the plan and was able to repeat key elements of the plan. Patient was given clear instructions to go to Emergency  Department or return to medical center if symptoms don't improve, worsen, or new problems develop.The patient verbalized understanding.  Requested Prescriptions   Signed Prescriptions Disp Refills   phentermine  37.5 MG capsule 30 capsule 0    Sig: Take 1 capsule (37.5 mg total) by mouth every morning.    Return in about 4  weeks (around 07/21/2023) for Follow-Up or next available weight check.  Senaida Dama, NP

## 2023-07-08 ENCOUNTER — Other Ambulatory Visit: Payer: Self-pay | Admitting: Orthopedic Surgery

## 2023-07-10 ENCOUNTER — Encounter (HOSPITAL_COMMUNITY): Payer: Self-pay

## 2023-07-17 ENCOUNTER — Ambulatory Visit (INDEPENDENT_AMBULATORY_CARE_PROVIDER_SITE_OTHER): Payer: 59 | Admitting: Family

## 2023-07-17 ENCOUNTER — Encounter: Payer: Self-pay | Admitting: Family

## 2023-07-17 VITALS — BP 136/87 | HR 81 | Wt 226.2 lb

## 2023-07-17 DIAGNOSIS — I10 Essential (primary) hypertension: Secondary | ICD-10-CM | POA: Diagnosis not present

## 2023-07-17 DIAGNOSIS — E785 Hyperlipidemia, unspecified: Secondary | ICD-10-CM

## 2023-07-17 DIAGNOSIS — Z7689 Persons encountering health services in other specified circumstances: Secondary | ICD-10-CM

## 2023-07-17 DIAGNOSIS — Z6837 Body mass index (BMI) 37.0-37.9, adult: Secondary | ICD-10-CM | POA: Diagnosis not present

## 2023-07-17 DIAGNOSIS — R7303 Prediabetes: Secondary | ICD-10-CM

## 2023-07-17 MED ORDER — ATORVASTATIN CALCIUM 20 MG PO TABS: 20.0000 mg | ORAL_TABLET | Freq: Every day | ORAL | 0 refills | Status: DC

## 2023-07-17 MED ORDER — PHENTERMINE HCL 37.5 MG PO CAPS: 37.5000 mg | ORAL_CAPSULE | ORAL | 0 refills | Status: DC

## 2023-07-17 MED ORDER — AMLODIPINE BESYLATE 5 MG PO TABS: 5.0000 mg | ORAL_TABLET | Freq: Every day | ORAL | 0 refills | Status: DC

## 2023-07-17 NOTE — Progress Notes (Signed)
 Patient ID: Darlene Crosby, female    DOB: June 02, 1965  MRN: [id number]  CC: Chronic Conditions Follow-Up  Subjective: Darlene Crosby is a 58 y.o. female who presents for chronic conditions follow-up.  Her concerns today include:  - Doing well on Amlodipine , no issues/concerns. She does not complain of red flag symptoms such as but not limited to chest pain, shortness of breath, worst headache of life, nausea/vomiting.  - Doing well on Atorvastatin , no issues/concerns. - Doing well on Phentermine , no issues/concerns. - Prediabetes follow-up.   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 04/12/2022   Status post lumbar laminectomy 09/20/2021   Body mass index (BMI) 37.0-37.9, adult 03/14/2019   Lumbar stenosis without neurogenic claudication 03/14/2019   Hyperlipidemia 02/08/2019   Lumbar spondylosis 10/25/2018   Degeneration of lumbar intervertebral disc 10/01/2018   Surgical wound dehiscence 06/06/2018   Osteoarthritis of left hip 04/25/2018   Spinal stenosis of lumbar region 02/23/2018   Pain in left knee 01/10/2018   Trigger thumb of right hand 03/30/2017   Symptomatic mammary hypertrophy 11/28/2014   Dyspnea 03/26/2013   Asthma vs VCD  02/28/2013   Hypertensive disorder 06/07/2012   Pyogenic granuloma 02/15/2012     Current Outpatient Medications on File Prior to Visit  Medication Sig Dispense Refill   acetaminophen  (TYLENOL ) 650 MG CR tablet Take 1,300 mg by mouth every 8 (eight) hours as needed for pain.     albuterol  (VENTOLIN  HFA) 108 (90 Base) MCG/ACT inhaler Inhale 2 puffs into the lungs every 4 (four) hours as needed (wheezing and SOB). 8 g 2   Ascorbic Acid  (VITAMIN C  PO) Take 1 tablet by mouth daily.     Calcium -Magnesium-Zinc (CAL-MAG-ZINC PO) Take 1 tablet by mouth daily.     Cholecalciferol  (VITAMIN D ) 50 MCG (2000 UT) tablet Take 2,000 Units by mouth daily.     diclofenac  (VOLTAREN )  50 MG EC tablet TAKE 1 TABLET BY MOUTH 3 TIMES  DAILY 300 tablet 2   ELDERBERRY PO Take 1 tablet by mouth daily.     fluticasone  (FLONASE ) 50 MCG/ACT nasal spray Place 1 spray into both nostrils daily.     hydrochlorothiazide  (HYDRODIURIL ) 25 MG tablet Take by mouth.     ipratropium-albuterol  (DUONEB) 0.5-2.5 (3) MG/3ML SOLN Inhale 3 mLs into the lungs every 6 (six) hours as needed for shortness of breath.     levocetirizine (XYZAL ) 5 MG tablet Take 1 tablet (5 mg total) by mouth every evening. 30 tablet 0   levocetirizine (XYZAL ) 5 MG tablet Take 1 tablet (5 mg total) by mouth every evening. 90 tablet 0   methocarbamol  (ROBAXIN ) 500 MG tablet TAKE 1 TABLET BY MOUTH EVERY 6  HOURS AS NEEDED (MUSCLE SPASMS,  PAIN). 50 tablet 2   pregabalin  (LYRICA ) 75 MG capsule TAKE 1 CAPSULE BY MOUTH TWICE  DAILY 60 capsule 2   Probiotic Product (PROBIOTIC PO) Take 1 capsule by mouth daily.     Spacer/Aero-Holding Chambers DEVI 1 Units by Does not apply route in the morning, at noon, and at bedtime. 1 Units 0   Turmeric 500 MG CAPS Take 500 mg by mouth daily.     vitamin B-12 (CYANOCOBALAMIN ) 1000 MCG tablet Take 1,000 mcg by mouth daily.     docusate sodium  (COLACE) 100 MG capsule Take 1 capsule (100 mg total) by mouth 2 (two) times daily. 40 capsule 0   HYDROcodone -acetaminophen  (NORCO) 7.5-325 MG tablet  Take 1 tablet by mouth every 4 (four) hours as needed for moderate pain. 40 tablet 0   No current facility-administered medications on file prior to visit.    Allergies  Allergen Reactions   Pineapple Shortness Of Breath, Swelling and Hives    Swelling of tongue    Chocolate Hives   Coconut (Cocos Nucifera) Hives and Rash   Strawberry Extract Hives   Wheat Hives   Chocolate Flavoring Agent (Non-Screening) Other (See Comments) and Hives   Flavoring Agent (Non-Screening) Hives   Pineapple Flavoring Agent (Non-Screening) Hives    Social History   Socioeconomic History   Marital status: Widowed     Spouse name: Not on file   Number of children: Not on file   Years of education: Not on file   Highest education level: Associate degree: occupational, Scientist, product/process development, or vocational program  Occupational History   Not on file  Tobacco Use   Smoking status: Never    Passive exposure: Never   Smokeless tobacco: Never  Vaping Use   Vaping status: Never Used  Substance and Sexual Activity   Alcohol  use: Not Currently    Alcohol /week: 1.0 standard drink of alcohol     Types: 1 Glasses of wine per week   Drug use: Never   Sexual activity: Not on file  Other Topics Concern   Not on file  Social History Narrative   Not on file   Social Drivers of Health   Financial Resource Strain: Low Risk  (06/21/2023)   Overall Financial Resource Strain (CARDIA)    Difficulty of Paying Living Expenses: Not hard at all  Food Insecurity: No Food Insecurity (06/21/2023)   Hunger Vital Sign    Worried About Running Out of Food in the Last Year: Never true    Ran Out of Food in the Last Year: Never true  Transportation Needs: No Transportation Needs (06/21/2023)   PRAPARE - Administrator, Civil Service (Medical): No    Lack of Transportation (Non-Medical): No  Physical Activity: Sufficiently Active (06/21/2023)   Exercise Vital Sign    Days of Exercise per Week: 5 days    Minutes of Exercise per Session: 40 min  Stress: No Stress Concern Present (06/21/2023)   [employer] of Occupational Health - Occupational Stress Questionnaire    Feeling of Stress : Not at all  Social Connections: Moderately Isolated (06/21/2023)   Social Connection and Isolation Panel [NHANES]    Frequency of Communication with Friends and Family: More than three times a week    Frequency of Social Gatherings with Friends and Family: Once a week    Attends Religious Services: More than 4 times per year    Active Member of [employer] or Organizations: No    Attends Banker Meetings: Never    Marital Status:  Widowed  Intimate Partner Violence: Not At Risk (04/13/2023)   Humiliation, Afraid, Rape, and Kick questionnaire    Fear of Current or Ex-Partner: No    Emotionally Abused: No    Physically Abused: No    Sexually Abused: No    Family History  Problem Relation Age of Onset   Hypertension Mother    Asthma Mother    Allergies Mother    Breast cancer Sister    Heart disease Sister    Hypertension Sister    Stroke Sister    Diabetes Sister    Colon cancer Neg Hx    Esophageal cancer Neg Hx    Stomach cancer  Neg Hx    Pancreatic cancer Neg Hx     Past Surgical History:  Procedure Laterality Date   ANTERIOR HIP REVISION Left 06/06/2018   Procedure: LEFT HIP WOUND DEHISCENCE POSSIBLE HEAD AND LINER EXCHANGE;  Surgeon: Adonica Hoose, MD;  Location: [facility];  Service: Orthopedics;  Laterality: Left;   COLONOSCOPY  2015   DIAGNOSTIC LAPAROSCOPY     tubal preg-took ovary and tube   LAPAROSCOPY FOR ECTOPIC PREGNANCY  1990s   LEFT SALPINGECTOMY AND RIGHT OOPHORECTOMY FOR ABNORMALITY   LUMBAR LAMINECTOMY N/A 09/20/2021   Procedure: L3-4 AND L4-5 CENTRAL LUMBAR LAMINECTOMIES;  Surgeon: Alphonso Jean, MD;  Location: [facility];  Service: Orthopedics;  Laterality: N/A;   MASS EXCISION  02/15/2012   Procedure: EXCISION MASS;  Surgeon: Hedy Living, MD;  Location: [facility];  Service: Orthopedics;  Laterality: Right;  Excision of Right Small Volar Mass   MYELOGRAM     TONSILLECTOMY  age 65   TOTAL HIP ARTHROPLASTY Left 04/25/2018   Procedure: TOTAL HIP ARTHROPLASTY ANTERIOR APPROACH;  Surgeon: Adonica Hoose, MD;  Location: [facility];  Service: Orthopedics;  Laterality: Left;   TRIGGER FINGER RELEASE Right 2018   thumb   WISDOM TOOTH EXTRACTION  age 3    ROS: Review of Systems Negative except as stated above  PHYSICAL EXAM: BP 136/87 (BP Location: Right Arm, Cuff Size: Large)   Pulse 81   Wt 226 lb 3.2 oz (102.6 kg)   LMP 05/04/2012   SpO2 91%   BMI 37.64 kg/m    Wt Readings from Last 3 Encounters:  07/17/23 226 lb 3.2 oz (102.6 kg)  06/23/23 229 lb (103.9 kg)  05/19/23 223 lb (101.2 kg)   Physical Exam HENT:     Head: Normocephalic and atraumatic.     Nose: Nose normal.     Mouth/Throat:     Mouth: Mucous membranes are moist.     Pharynx: Oropharynx is clear.  Eyes:     Extraocular Movements: Extraocular movements intact.     Conjunctiva/sclera: Conjunctivae normal.     Pupils: Pupils are equal, round, and reactive to light.  Cardiovascular:     Rate and Rhythm: Normal rate and regular rhythm.     Pulses: Normal pulses.     Heart sounds: Normal heart sounds.  Pulmonary:     Effort: Pulmonary effort is normal.     Breath sounds: Normal breath sounds.  Musculoskeletal:        General: Normal range of motion.     Cervical back: Normal range of motion and neck supple.  Neurological:     General: No focal deficit present.     Mental Status: She is alert and oriented to person, place, and time.  Psychiatric:        Mood and Affect: Mood normal.        Behavior: Behavior normal.     ASSESSMENT AND PLAN: 1. Primary hypertension (Primary) - Continue Amlodipine  as prescribed.  - Counseled on blood pressure goal of less than 130/80, low-sodium, DASH diet, medication compliance, and 150 minutes of moderate intensity exercise per week as tolerated. Counseled on medication adherence and adverse effects. - Follow-up with primary provider in 3 months or sooner if needed. - amLODipine  (NORVASC ) 5 MG tablet; Take 1 tablet (5 mg total) by mouth daily.  Dispense: 90 tablet; Refill: 0  2. Hyperlipidemia, unspecified hyperlipidemia type - Continue Atorvastatin  as prescribed. Counseled on medication adherence/adverse effects.  - Routine screening.  -  Follow-up with primary provider as scheduled. - atorvastatin  (LIPITOR) 20 MG tablet; Take 1 tablet (20 mg total) by mouth daily.  Dispense: 90 tablet; Refill: 0 - Lipid panel  3. Encounter for  weight management 4. BMI 37.0-37.9, adult - Patient lost 3 pounds since previous office visit.  - Continue Phentermine  as prescribed. Counseled on medication adherence/adverse effects.  - Follow-up with primary provider in 4 weeks or sooner if needed. - phentermine  37.5 MG capsule; Take 1 capsule (37.5 mg total) by mouth every morning.  Dispense: 30 capsule; Refill: 0  5. Prediabetes - Routine screening.  - Hemoglobin A1c  Patient was given the opportunity to ask questions.  Patient verbalized understanding of the plan and was able to repeat key elements of the plan. Patient was given clear instructions to go to Emergency Department or return to medical center if symptoms don't improve, worsen, or new problems develop.The patient verbalized understanding.   Orders Placed This Encounter  Procedures   Lipid panel   Hemoglobin A1c     Requested Prescriptions   Signed Prescriptions Disp Refills   amLODipine  (NORVASC ) 5 MG tablet 90 tablet 0    Sig: Take 1 tablet (5 mg total) by mouth daily.   atorvastatin  (LIPITOR) 20 MG tablet 90 tablet 0    Sig: Take 1 tablet (20 mg total) by mouth daily.   phentermine  37.5 MG capsule 30 capsule 0    Sig: Take 1 capsule (37.5 mg total) by mouth every morning.    Return in about 3 months (around 10/17/2023) for Follow-Up or next available chronic conditions and 4 weeks weight check.  Darlene Dama, NP

## 2023-07-18 ENCOUNTER — Other Ambulatory Visit: Payer: Self-pay | Admitting: Family

## 2023-07-18 ENCOUNTER — Ambulatory Visit (INDEPENDENT_AMBULATORY_CARE_PROVIDER_SITE_OTHER): Admitting: Orthopedic Surgery

## 2023-07-18 ENCOUNTER — Other Ambulatory Visit (INDEPENDENT_AMBULATORY_CARE_PROVIDER_SITE_OTHER)

## 2023-07-18 ENCOUNTER — Ambulatory Visit: Payer: Self-pay | Admitting: Family

## 2023-07-18 DIAGNOSIS — M5416 Radiculopathy, lumbar region: Secondary | ICD-10-CM | POA: Diagnosis not present

## 2023-07-18 DIAGNOSIS — Z9889 Other specified postprocedural states: Secondary | ICD-10-CM

## 2023-07-18 DIAGNOSIS — I10 Essential (primary) hypertension: Secondary | ICD-10-CM

## 2023-07-18 LAB — LIPID PANEL
Chol/HDL Ratio: 4.4 ratio (ref 0.0–4.4)
Cholesterol, Total: 154 mg/dL (ref 100–199)
HDL: 35 mg/dL — ABNORMAL LOW (ref >39)
LDL Chol Calc (NIH): 95 mg/dL (ref 0–99)
Triglycerides: 135 mg/dL (ref 0–149)
VLDL Cholesterol Cal: 24 mg/dL (ref 5–40)

## 2023-07-18 NOTE — Progress Notes (Signed)
 Orthopedic Spine Surgery Office Note   Assessment: Patient is a 58 y.o. female with history of low back pain but has now developed radiating left anterior thigh pain and weakness with hip flexion     Plan: -Continue Robaxin  and Lyrica  since they have been helpful for pain, Crosby refill them as needed -Given her progressive weakness in the left lower extremity, recommended an MRI to evaluate further -Patient should return to office in 4 weeks, x-rays at next visit: none   ___________________________________________________________________________     History:   Patient is a 58 y.o. female who presents today for routine follow-up on her lumbar spine.  She has a history of L3-5 laminectomies with Dr. Richardo Chandler.  She had noticed significant relief in her leg pain since the surgery but did have persistent low back pain.  She has been using Lyrica  and Robaxin  to help with her low back pain.  She feels that those medications make it tolerable.  Within the last month though, she has noticed left anterior thigh pain and weakness in her left hip.  She has had difficulty with walking and has had to start using a cane as a result of the weakness in the leg.  There was no trauma or injury that preceded the onset of the weakness and pain.  She has had the symptoms for about a month now.  She feels that the weakness has gotten worse with time.  Does not have any pain rating into the right lower extremity.     Physical Exam:   General: no acute distress, appears stated age Neurologic: alert, answering questions appropriately, following commands Respiratory: unlabored breathing on room air, symmetric chest rise Psychiatric: appropriate affect, normal cadence to speech     MSK (spine):   -Strength exam                                                   Left                  Right EHL                              5/5                  5/5 TA                                 5/5                  5/5 GSC                              5/5                  5/5 Knee extension            4/5                  5/5 Hip flexion                    3/5                  5/5   -Sensory exam  Sensation intact to light touch in L3-S1 nerve distributions of bilateral lower extremities    Imaging: XRs of the lumbar spine from 07/18/2023 were independently reviewed and interpreted, showing laminectomy defect from L3-5. No evidence of instability on flexion/extension views. Disc height loss with anterior osteophyte formation at multiple levels but most notable at L4/5. No fracture or dislocation seen.      Patient name: Darlene Crosby Patient MRN: [id number] Date of visit: 07/18/23

## 2023-07-19 ENCOUNTER — Ambulatory Visit: Payer: 59 | Admitting: Orthopedic Surgery

## 2023-07-22 ENCOUNTER — Ambulatory Visit
Admission: RE | Admit: 2023-07-22 | Discharge: 2023-07-22 | Disposition: A | Discharge status: Home / Self Care | Source: Ambulatory Visit | Attending: Orthopedic Surgery | Admitting: Orthopedic Surgery

## 2023-07-22 DIAGNOSIS — M5416 Radiculopathy, lumbar region: Secondary | ICD-10-CM

## 2023-08-13 ENCOUNTER — Other Ambulatory Visit: Payer: Self-pay | Admitting: Family

## 2023-08-13 DIAGNOSIS — J3089 Other allergic rhinitis: Secondary | ICD-10-CM

## 2023-08-14 ENCOUNTER — Ambulatory Visit (INDEPENDENT_AMBULATORY_CARE_PROVIDER_SITE_OTHER): Payer: Self-pay | Admitting: Family

## 2023-08-14 ENCOUNTER — Encounter: Payer: Self-pay | Admitting: Family

## 2023-08-14 ENCOUNTER — Ambulatory Visit: Payer: Self-pay | Admitting: Family

## 2023-08-14 VITALS — BP 138/87 | HR 75 | Temp 97.7°F | Resp 16 | Ht 65.0 in | Wt 226.6 lb

## 2023-08-14 DIAGNOSIS — Z7689 Persons encountering health services in other specified circumstances: Secondary | ICD-10-CM

## 2023-08-14 DIAGNOSIS — R7303 Prediabetes: Secondary | ICD-10-CM

## 2023-08-14 DIAGNOSIS — Z6837 Body mass index (BMI) 37.0-37.9, adult: Secondary | ICD-10-CM | POA: Diagnosis not present

## 2023-08-14 DIAGNOSIS — E669 Obesity, unspecified: Secondary | ICD-10-CM

## 2023-08-14 LAB — POCT GLYCOSYLATED HEMOGLOBIN (HGB A1C): Hemoglobin A1C: 6.1 % — AB (ref 4.0–5.6)

## 2023-08-14 MED ORDER — PHENTERMINE HCL 37.5 MG PO CAPS: 37.5000 mg | ORAL_CAPSULE | ORAL | 0 refills | Status: DC

## 2023-08-14 NOTE — Progress Notes (Signed)
 4 week follow up

## 2023-08-14 NOTE — Telephone Encounter (Signed)
 Complete

## 2023-08-14 NOTE — Progress Notes (Signed)
 Patient ID: Darlene Crosby, female    DOB: 08-13-1965  MRN: [id number]  CC: Weight Check  Subjective: Darlene Crosby is a 58 y.o. female who presents for weight check.   Her concerns today include:  - States since previous office visit she did not take Phentermine  because pharmacy never called her to say it was ready for pickup. - Prediabetes follow-up.  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 04/12/2022   Status post lumbar laminectomy 09/20/2021   Body mass index (BMI) 37.0-37.9, adult 03/14/2019   Lumbar stenosis without neurogenic claudication 03/14/2019   Hyperlipidemia 02/08/2019   Lumbar spondylosis 10/25/2018   Degeneration of lumbar intervertebral disc 10/01/2018   Surgical wound dehiscence 06/06/2018   Osteoarthritis of left hip 04/25/2018   Spinal stenosis of lumbar region 02/23/2018   Pain in left knee 01/10/2018   Trigger thumb of right hand 03/30/2017   Symptomatic mammary hypertrophy 11/28/2014   Dyspnea 03/26/2013   Asthma vs VCD  02/28/2013   Hypertensive disorder 06/07/2012   Pyogenic granuloma 02/15/2012     Current Outpatient Medications on File Prior to Visit  Medication Sig Dispense Refill   acetaminophen  (TYLENOL ) 650 MG CR tablet Take 1,300 mg by mouth every 8 (eight) hours as needed for pain.     albuterol  (VENTOLIN  HFA) 108 (90 Base) MCG/ACT inhaler Inhale 2 puffs into the lungs every 4 (four) hours as needed (wheezing and SOB). 8 g 2   amLODipine  (NORVASC ) 5 MG tablet Take 1 tablet (5 mg total) by mouth daily. 90 tablet 0   Ascorbic Acid  (VITAMIN C  PO) Take 1 tablet by mouth daily.     atorvastatin  (LIPITOR) 20 MG tablet Take 1 tablet (20 mg total) by mouth daily. 90 tablet 0   Calcium -Magnesium-Zinc (CAL-MAG-ZINC PO) Take 1 tablet by mouth daily.     Cholecalciferol  (VITAMIN D ) 50 MCG (2000 UT) tablet Take 2,000 Units by mouth daily.     diclofenac  (VOLTAREN ) 50 MG  EC tablet TAKE 1 TABLET BY MOUTH 3 TIMES  DAILY 300 tablet 2   ipratropium-albuterol  (DUONEB) 0.5-2.5 (3) MG/3ML SOLN Inhale 3 mLs into the lungs every 6 (six) hours as needed for shortness of breath.     levocetirizine (XYZAL ) 5 MG tablet Take 1 tablet (5 mg total) by mouth every evening. 30 tablet 0   levocetirizine (XYZAL ) 5 MG tablet Take 1 tablet (5 mg total) by mouth every evening. 90 tablet 0   methocarbamol  (ROBAXIN ) 500 MG tablet TAKE 1 TABLET BY MOUTH EVERY 6  HOURS AS NEEDED (MUSCLE SPASMS,  PAIN). 50 tablet 2   pregabalin  (LYRICA ) 75 MG capsule TAKE 1 CAPSULE BY MOUTH TWICE  DAILY 60 capsule 2   Probiotic Product (PROBIOTIC PO) Take 1 capsule by mouth daily.     Spacer/Aero-Holding Chambers DEVI 1 Units by Does not apply route in the morning, at noon, and at bedtime. 1 Units 0   Turmeric 500 MG CAPS Take 500 mg by mouth daily.     vitamin B-12 (CYANOCOBALAMIN ) 1000 MCG tablet Take 1,000 mcg by mouth daily.     docusate sodium  (COLACE) 100 MG capsule Take 1 capsule (100 mg total) by mouth 2 (two) times daily. 40 capsule 0   ELDERBERRY PO Take 1 tablet by mouth daily.     fluticasone  (FLONASE ) 50 MCG/ACT nasal spray Place 1 spray into both nostrils daily.     hydrochlorothiazide  (HYDRODIURIL ) 25 MG  tablet Take by mouth.     HYDROcodone -acetaminophen  (NORCO) 7.5-325 MG tablet Take 1 tablet by mouth every 4 (four) hours as needed for moderate pain. 40 tablet 0   No current facility-administered medications on file prior to visit.    Allergies  Allergen Reactions   Pineapple Shortness Of Breath, Swelling and Hives    Swelling of tongue    Chocolate Hives   Coconut (Cocos Nucifera) Hives and Rash   Strawberry Extract Hives   Wheat Hives   Chocolate Flavoring Agent (Non-Screening) Other (See Comments) and Hives   Flavoring Agent (Non-Screening) Hives   Pineapple Flavoring Agent (Non-Screening) Hives    Social History   Socioeconomic History   Marital status: Widowed    Spouse  name: Not on file   Number of children: Not on file   Years of education: Not on file   Highest education level: Associate degree: occupational, Scientist, product/process development, or vocational program  Occupational History   Not on file  Tobacco Use   Smoking status: Never    Passive exposure: Never   Smokeless tobacco: Never  Vaping Use   Vaping status: Never Used  Substance and Sexual Activity   Alcohol  use: Not Currently    Alcohol /week: 1.0 standard drink of alcohol     Types: 1 Glasses of wine per week   Drug use: Never   Sexual activity: Not on file  Other Topics Concern   Not on file  Social History Narrative   Not on file   Social Drivers of Health   Financial Resource Strain: Low Risk  (06/21/2023)   Overall Financial Resource Strain (CARDIA)    Difficulty of Paying Living Expenses: Not hard at all  Food Insecurity: No Food Insecurity (06/21/2023)   Hunger Vital Sign    Worried About Running Out of Food in the Last Year: Never true    Ran Out of Food in the Last Year: Never true  Transportation Needs: No Transportation Needs (06/21/2023)   PRAPARE - Administrator, Civil Service (Medical): No    Lack of Transportation (Non-Medical): No  Physical Activity: Sufficiently Active (06/21/2023)   Exercise Vital Sign    Days of Exercise per Week: 5 days    Minutes of Exercise per Session: 40 min  Stress: No Stress Concern Present (06/21/2023)   [employer] of Occupational Health - Occupational Stress Questionnaire    Feeling of Stress : Not at all  Social Connections: Moderately Isolated (06/21/2023)   Social Connection and Isolation Panel    Frequency of Communication with Friends and Family: More than three times a week    Frequency of Social Gatherings with Friends and Family: Once a week    Attends Religious Services: More than 4 times per year    Active Member of [employer] or Organizations: No    Attends Banker Meetings: Never    Marital Status: Widowed  Intimate  Partner Violence: Not At Risk (04/13/2023)   Humiliation, Afraid, Rape, and Kick questionnaire    Fear of Current or Ex-Partner: No    Emotionally Abused: No    Physically Abused: No    Sexually Abused: No    Family History  Problem Relation Age of Onset   Hypertension Mother    Asthma Mother    Allergies Mother    Breast cancer Sister    Heart disease Sister    Hypertension Sister    Stroke Sister    Diabetes Sister    Colon cancer Neg Hx  Esophageal cancer Neg Hx    Stomach cancer Neg Hx    Pancreatic cancer Neg Hx     Past Surgical History:  Procedure Laterality Date   ANTERIOR HIP REVISION Left 06/06/2018   Procedure: LEFT HIP WOUND DEHISCENCE POSSIBLE HEAD AND LINER EXCHANGE;  Surgeon: Adonica Hoose, MD;  Location: [facility];  Service: Orthopedics;  Laterality: Left;   COLONOSCOPY  2015   DIAGNOSTIC LAPAROSCOPY     tubal preg-took ovary and tube   LAPAROSCOPY FOR ECTOPIC PREGNANCY  1990s   LEFT SALPINGECTOMY AND RIGHT OOPHORECTOMY FOR ABNORMALITY   LUMBAR LAMINECTOMY N/A 09/20/2021   Procedure: L3-4 AND L4-5 CENTRAL LUMBAR LAMINECTOMIES;  Surgeon: Alphonso Jean, MD;  Location: [facility];  Service: Orthopedics;  Laterality: N/A;   MASS EXCISION  02/15/2012   Procedure: EXCISION MASS;  Surgeon: Hedy Living, MD;  Location: [facility];  Service: Orthopedics;  Laterality: Right;  Excision of Right Small Volar Mass   MYELOGRAM     TONSILLECTOMY  age 89   TOTAL HIP ARTHROPLASTY Left 04/25/2018   Procedure: TOTAL HIP ARTHROPLASTY ANTERIOR APPROACH;  Surgeon: Adonica Hoose, MD;  Location: [facility];  Service: Orthopedics;  Laterality: Left;   TRIGGER FINGER RELEASE Right 2018   thumb   WISDOM TOOTH EXTRACTION  age 52    ROS: Review of Systems Negative except as stated above  PHYSICAL EXAM: BP 138/87   Pulse 75   Temp 97.7 F (36.5 C) (Oral)   Resp 16   Ht 5' 5 (1.651 m)   Wt 226 lb 9.6 oz (102.8 kg)   LMP 05/04/2012   SpO2 92%   BMI 37.71  kg/m   Wt Readings from Last 3 Encounters:  08/14/23 226 lb 9.6 oz (102.8 kg)  07/17/23 226 lb 3.2 oz (102.6 kg)  06/23/23 229 lb (103.9 kg)   Physical Exam HENT:     Head: Normocephalic and atraumatic.     Nose: Nose normal.     Mouth/Throat:     Mouth: Mucous membranes are moist.     Pharynx: Oropharynx is clear.   Eyes:     Extraocular Movements: Extraocular movements intact.     Conjunctiva/sclera: Conjunctivae normal.     Pupils: Pupils are equal, round, and reactive to light.    Cardiovascular:     Rate and Rhythm: Normal rate and regular rhythm.     Pulses: Normal pulses.     Heart sounds: Normal heart sounds.  Pulmonary:     Effort: Pulmonary effort is normal.     Breath sounds: Normal breath sounds.   Musculoskeletal:        General: Normal range of motion.     Cervical back: Normal range of motion and neck supple.   Neurological:     General: No focal deficit present.     Mental Status: She is alert and oriented to person, place, and time.   Psychiatric:        Mood and Affect: Mood normal.        Behavior: Behavior normal.    ASSESSMENT AND PLAN: 1. Encounter for weight management (Primary) 2. BMI 37.0-37.9, adult - Patient the same weight compared to previous office visit.  - Continue Phentermine  as prescribed. Counseled on medication adherence/adverse effects.  - Follow-up with primary provider in 4 weeks or sooner if needed. - phentermine  37.5 MG capsule; Take 1 capsule (37.5 mg total) by mouth every morning.  Dispense: 30 capsule; Refill: 0  3. Prediabetes - Routine  screening.  - POCT glycosylated hemoglobin (Hb A1C); Future   Patient was given the opportunity to ask questions.  Patient verbalized understanding of the plan and was able to repeat key elements of the plan. Patient was given clear instructions to go to Emergency Department or return to medical center if symptoms don't improve, worsen, or new problems develop.The patient verbalized  understanding.   Orders Placed This Encounter  Procedures   POCT glycosylated hemoglobin (Hb A1C)     Requested Prescriptions   Signed Prescriptions Disp Refills   phentermine  37.5 MG capsule 30 capsule 0    Sig: Take 1 capsule (37.5 mg total) by mouth every morning.    Return in about 4 weeks (around 09/11/2023) for Follow-Up or next available weight check .  Senaida Dama, NP

## 2023-08-16 ENCOUNTER — Ambulatory Visit (INDEPENDENT_AMBULATORY_CARE_PROVIDER_SITE_OTHER): Admitting: Orthopedic Surgery

## 2023-08-16 DIAGNOSIS — M5416 Radiculopathy, lumbar region: Secondary | ICD-10-CM

## 2023-08-16 NOTE — Progress Notes (Addendum)
 Orthopedic Spine Surgery Office Note   Assessment: Patient is a 58 y.o. female with history of low back pain that has developed left hip flexion weakness and bilateral thigh pain/paresthesias     Plan: - Patient has significant weakness with hip flexion and has had symptoms now for over 6 weeks without any improvement, so discussed surgery as a treatment option for her.  Specifically, discussed laminectomy with medial facetectomies.  After discussion, patient like to proceed -Patient will not be seen at the date of surgery  The patient has symptoms consistent with lumbar radiculopathy. The patient's symptoms were not improving with conservative treatment and she has developed left hip flexion weakness so operative management was discussed in the form of L2, L3 segment laminectomies with partial medial facetectomies. The risks including but not limited to iatrogenic instability, dural tear, nerve root injury, paralysis, persistent pain, infection, bleeding, heart attack, death, dvt/pe, fracture, and need for additional procedures were discussed with the patient.  I explained that her risk of infection and durotomy would be higher given that this is a revision.  I also told her that there is a chance that her weakness does not improve since it is significant and has been present for several weeks now.  The benefit of the surgery would be improvement in the patient's radiating leg pain with the potential for improvement in her hip flexion weakness. I explained that back pain relief is not the goal of the surgery and it is not reliably alleviated with this surgery. The alternatives to surgical management were covered with the patient and included continued monitoring, physical therapy, over-the-counter pain medications, ambulatory aids, injections, and activity modification. All the patient's questions were answered to her satisfaction. After this discussion, the patient expressed understanding and elected to  proceed with surgical intervention.      ___________________________________________________________________________     History:   Patient is a 58 y.o. female who presents today for follow-up on her lumbar spine.  She has a history of L3-5 laminectomies with Dr. Richardo Chandler.  Patient continues to have low back pain that radiates into her bilateral anterior thighs.  She also notices paresthesias in that distribution.  She has no pain radiating past the knees.  She has developed left hip flexor weakness for the last 2 months.  She has had weakness before and noted it before surgery with Dr. Richardo Chandler.  She said it improved after the surgery with Dr. Richardo Chandler.  She has had difficulty walking and uses a cane as a result of the hip flexor weakness.  She has not noticed any improvement in the weakness since she was last seen in the office.  She has not had any relief of the pain with the conservative treatments tried so far     Physical Exam:   General: no acute distress, appears stated age Neurologic: alert, answering questions appropriately, following commands Respiratory: unlabored breathing on room air, symmetric chest rise Psychiatric: appropriate affect, normal cadence to speech     MSK (spine):   -Strength exam                                                   Left                  Right EHL  5/5                  5/5 TA                                 5/5                  5/5 GSC                             5/5                  5/5 Knee extension            4+/5                5/5 Hip flexion                    3/5                  5/5   -Sensory exam                           Sensation intact to light touch in L2-S1 nerve distributions of bilateral lower extremities     Imaging: XRs of the lumbar spine from 07/18/2023 were previously independently reviewed and interpreted, showing laminectomy defect from L3-5. No evidence of instability on flexion/extension views.  Disc height loss with anterior osteophyte formation at multiple levels but most notable at L4/5. No fracture or dislocation seen.    MRI of the lumbar spine from 07/18/2023 was independently reviewed and interpreted, showing laminectomy defects at L3/4 and L4/5.  Central stenosis and lateral recess stenosis at L2/3.  Right-sided foraminal stenosis at L3/4 and L4/5.  No other significant stenosis seen.    Patient name: Darlene Crosby Patient MRN: [id number] Date of visit: 08/16/23

## 2023-09-11 ENCOUNTER — Ambulatory Visit (INDEPENDENT_AMBULATORY_CARE_PROVIDER_SITE_OTHER): Admitting: Family

## 2023-09-11 ENCOUNTER — Encounter: Payer: Self-pay | Admitting: Family

## 2023-09-11 VITALS — BP 134/89 | HR 88 | Temp 97.6°F | Resp 16 | Ht 65.0 in | Wt 226.4 lb

## 2023-09-11 DIAGNOSIS — Z6837 Body mass index (BMI) 37.0-37.9, adult: Secondary | ICD-10-CM | POA: Diagnosis not present

## 2023-09-11 DIAGNOSIS — Z7689 Persons encountering health services in other specified circumstances: Secondary | ICD-10-CM | POA: Diagnosis not present

## 2023-09-11 DIAGNOSIS — E669 Obesity, unspecified: Secondary | ICD-10-CM | POA: Diagnosis not present

## 2023-09-11 MED ORDER — PHENTERMINE HCL 37.5 MG PO CAPS: 37.5000 mg | ORAL_CAPSULE | ORAL | 0 refills | Status: DC

## 2023-09-11 NOTE — Progress Notes (Signed)
 Surgery on the 09/22/2023

## 2023-09-11 NOTE — Progress Notes (Signed)
 Patient ID: ALTAIR STANKO, female    DOB: 1965/05/03  MRN: [id number]  CC: Weight Check  Subjective: Darlene Crosby is a 58 y.o. female who presents for weight check.   Her concerns today include:  - Doing well on Phentermine , no issues/concerns. She is watching what she eats. She exercises.  - States she has upcoming orthopedic-related surgery on 09/22/2023. Patient confirms she does not need labs or any further assistance from Primary Care. States she wanted to let primary provider know.  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 04/12/2022   Status post lumbar laminectomy 09/20/2021   Body mass index (BMI) 37.0-37.9, adult 03/14/2019   Lumbar stenosis without neurogenic claudication 03/14/2019   Hyperlipidemia 02/08/2019   Lumbar spondylosis 10/25/2018   Degeneration of lumbar intervertebral disc 10/01/2018   Surgical wound dehiscence 06/06/2018   Osteoarthritis of left hip 04/25/2018   Spinal stenosis of lumbar region 02/23/2018   Pain in left knee 01/10/2018   Trigger thumb of right hand 03/30/2017   Symptomatic mammary hypertrophy 11/28/2014   Dyspnea 03/26/2013   Asthma vs VCD  02/28/2013   Hypertensive disorder 06/07/2012   Pyogenic granuloma 02/15/2012     Current Outpatient Medications on File Prior to Visit  Medication Sig Dispense Refill   ELDERBERRY PO Take 1 tablet by mouth daily.     fluticasone  (FLONASE ) 50 MCG/ACT nasal spray Place 1 spray into both nostrils daily.     hydrochlorothiazide  (HYDRODIURIL ) 25 MG tablet Take by mouth.     ipratropium-albuterol  (DUONEB) 0.5-2.5 (3) MG/3ML SOLN Inhale 3 mLs into the lungs every 6 (six) hours as needed for shortness of breath.     levocetirizine (XYZAL ) 5 MG tablet Take 1 tablet (5 mg total) by mouth every evening. 30 tablet 0   methocarbamol  (ROBAXIN ) 500 MG tablet TAKE 1 TABLET BY MOUTH EVERY 6  HOURS AS NEEDED (MUSCLE SPASMS,  PAIN). 50  tablet 2   pregabalin  (LYRICA ) 75 MG capsule TAKE 1 CAPSULE BY MOUTH TWICE  DAILY 60 capsule 2   Probiotic Product (PROBIOTIC PO) Take 1 capsule by mouth daily.     Spacer/Aero-Holding Chambers DEVI 1 Units by Does not apply route in the morning, at noon, and at bedtime. 1 Units 0   Turmeric 500 MG CAPS Take 500 mg by mouth daily.     vitamin B-12 (CYANOCOBALAMIN ) 1000 MCG tablet Take 1,000 mcg by mouth daily.     acetaminophen  (TYLENOL ) 650 MG CR tablet Take 1,300 mg by mouth every 8 (eight) hours as needed for pain.     albuterol  (VENTOLIN  HFA) 108 (90 Base) MCG/ACT inhaler Inhale 2 puffs into the lungs every 4 (four) hours as needed (wheezing and SOB). 8 g 2   amLODipine  (NORVASC ) 5 MG tablet Take 1 tablet (5 mg total) by mouth daily. 90 tablet 0   Ascorbic Acid  (VITAMIN C  PO) Take 1 tablet by mouth daily.     atorvastatin  (LIPITOR) 20 MG tablet Take 1 tablet (20 mg total) by mouth daily. 90 tablet 0   Calcium -Magnesium-Zinc (CAL-MAG-ZINC PO) Take 1 tablet by mouth daily.     Cholecalciferol  (VITAMIN D ) 50 MCG (2000 UT) tablet Take 2,000 Units by mouth daily.     diclofenac  (VOLTAREN ) 50 MG EC tablet TAKE 1 TABLET BY MOUTH 3 TIMES  DAILY 300 tablet 2   docusate sodium  (COLACE) 100 MG capsule Take 1 capsule (100 mg total) by mouth  2 (two) times daily. 40 capsule 0   HYDROcodone -acetaminophen  (NORCO) 7.5-325 MG tablet Take 1 tablet by mouth every 4 (four) hours as needed for moderate pain. 40 tablet 0   levocetirizine (XYZAL ) 5 MG tablet TAKE 1 TABLET BY MOUTH ONCE DAILY IN THE EVENING (Patient not taking: Reported on 09/11/2023) 90 tablet 0   No current facility-administered medications on file prior to visit.    Allergies  Allergen Reactions   Pineapple Shortness Of Breath, Swelling and Hives    Swelling of tongue    Chocolate Hives   Coconut (Cocos Nucifera) Hives and Rash   Strawberry Extract Hives   Wheat Hives   Chocolate Flavoring Agent (Non-Screening) Other (See Comments) and  Hives   Flavoring Agent (Non-Screening) Hives   Pineapple Flavoring Agent (Non-Screening) Hives    Social History   Socioeconomic History   Marital status: Widowed    Spouse name: Not on file   Number of children: Not on file   Years of education: Not on file   Highest education level: Associate degree: occupational, Scientist, product/process development, or vocational program  Occupational History   Not on file  Tobacco Use   Smoking status: Never    Passive exposure: Never   Smokeless tobacco: Never  Vaping Use   Vaping status: Never Used  Substance and Sexual Activity   Alcohol  use: Not Currently    Alcohol /week: 1.0 standard drink of alcohol     Types: 1 Glasses of wine per week   Drug use: Never   Sexual activity: Not on file  Other Topics Concern   Not on file  Social History Narrative   Not on file   Social Drivers of Health   Financial Resource Strain: Low Risk  (06/21/2023)   Overall Financial Resource Strain (CARDIA)    Difficulty of Paying Living Expenses: Not hard at all  Food Insecurity: No Food Insecurity (06/21/2023)   Hunger Vital Sign    Worried About Running Out of Food in the Last Year: Never true    Ran Out of Food in the Last Year: Never true  Transportation Needs: No Transportation Needs (06/21/2023)   PRAPARE - Administrator, Civil Service (Medical): No    Lack of Transportation (Non-Medical): No  Physical Activity: Sufficiently Active (06/21/2023)   Exercise Vital Sign    Days of Exercise per Week: 5 days    Minutes of Exercise per Session: 40 min  Stress: No Stress Concern Present (06/21/2023)   [employer] of Occupational Health - Occupational Stress Questionnaire    Feeling of Stress : Not at all  Social Connections: Moderately Isolated (06/21/2023)   Social Connection and Isolation Panel    Frequency of Communication with Friends and Family: More than three times a week    Frequency of Social Gatherings with Friends and Family: Once a week    Attends  Religious Services: More than 4 times per year    Active Member of [employer] or Organizations: No    Attends Banker Meetings: Never    Marital Status: Widowed  Intimate Partner Violence: Not At Risk (04/13/2023)   Humiliation, Afraid, Rape, and Kick questionnaire    Fear of Current or Ex-Partner: No    Emotionally Abused: No    Physically Abused: No    Sexually Abused: No    Family History  Problem Relation Age of Onset   Hypertension Mother    Asthma Mother    Allergies Mother    Breast cancer Sister  Heart disease Sister    Hypertension Sister    Stroke Sister    Diabetes Sister    Colon cancer Neg Hx    Esophageal cancer Neg Hx    Stomach cancer Neg Hx    Pancreatic cancer Neg Hx     Past Surgical History:  Procedure Laterality Date   ANTERIOR HIP REVISION Left 06/06/2018   Procedure: LEFT HIP WOUND DEHISCENCE POSSIBLE HEAD AND LINER EXCHANGE;  Surgeon: Fidel Rogue, MD;  Location: [facility];  Service: Orthopedics;  Laterality: Left;   COLONOSCOPY  2015   DIAGNOSTIC LAPAROSCOPY     tubal preg-took ovary and tube   LAPAROSCOPY FOR ECTOPIC PREGNANCY  1990s   LEFT SALPINGECTOMY AND RIGHT OOPHORECTOMY FOR ABNORMALITY   LUMBAR LAMINECTOMY N/A 09/20/2021   Procedure: L3-4 AND L4-5 CENTRAL LUMBAR LAMINECTOMIES;  Surgeon: Lucilla Lynwood BRAVO, MD;  Location: [facility];  Service: Orthopedics;  Laterality: N/A;   MASS EXCISION  02/15/2012   Procedure: EXCISION MASS;  Surgeon: Donnice DELENA Robinsons, MD;  Location: [facility];  Service: Orthopedics;  Laterality: Right;  Excision of Right Small Volar Mass   MYELOGRAM     TONSILLECTOMY  age 89   TOTAL HIP ARTHROPLASTY Left 04/25/2018   Procedure: TOTAL HIP ARTHROPLASTY ANTERIOR APPROACH;  Surgeon: Fidel Rogue, MD;  Location: [facility];  Service: Orthopedics;  Laterality: Left;   TRIGGER FINGER RELEASE Right 2018   thumb   WISDOM TOOTH EXTRACTION  age 13    ROS: Review of Systems Negative except as stated  above  PHYSICAL EXAM: BP 134/89   Pulse 88   Temp 97.6 F (36.4 C) (Oral)   Resp 16   Ht 5' 5 (1.651 m)   Wt 226 lb 6.4 oz (102.7 kg)   LMP 05/04/2012   SpO2 93%   BMI 37.67 kg/m   Wt Readings from Last 3 Encounters:  09/11/23 226 lb 6.4 oz (102.7 kg)  08/14/23 226 lb 9.6 oz (102.8 kg)  07/17/23 226 lb 3.2 oz (102.6 kg)   Physical Exam HENT:     Head: Normocephalic and atraumatic.     Nose: Nose normal.     Mouth/Throat:     Mouth: Mucous membranes are moist.     Pharynx: Oropharynx is clear.  Eyes:     Extraocular Movements: Extraocular movements intact.     Conjunctiva/sclera: Conjunctivae normal.     Pupils: Pupils are equal, round, and reactive to light.  Cardiovascular:     Rate and Rhythm: Normal rate and regular rhythm.     Pulses: Normal pulses.     Heart sounds: Normal heart sounds.  Pulmonary:     Effort: Pulmonary effort is normal.     Breath sounds: Normal breath sounds.  Musculoskeletal:        General: Normal range of motion.     Cervical back: Normal range of motion and neck supple.  Neurological:     General: No focal deficit present.     Mental Status: She is alert and oriented to person, place, and time.  Psychiatric:        Mood and Affect: Mood normal.        Behavior: Behavior normal.     ASSESSMENT AND PLAN: 1. Encounter for weight management (Primary) 2. BMI 37.0-37.9, adult - Patient remains the same weight as previous office visit.  - Continue Phentermine  as prescribed. Counseled on  medication adherence/adverse effects.  - I did check the [place]  prescription drug database. - Follow-up  with primary provider in 4 weeks or sooner if needed. - phentermine  37.5 MG capsule; Take 1 capsule (37.5 mg total) by mouth every morning.  Dispense: 30 capsule; Refill: 0    Patient was given the opportunity to ask questions.  Patient verbalized understanding of the plan and was able to repeat key elements of the plan. Patient was given  clear instructions to go to Emergency Department or return to medical center if symptoms don't improve, worsen, or new problems develop.The patient verbalized understanding.    Requested Prescriptions   Signed Prescriptions Disp Refills   phentermine  37.5 MG capsule 30 capsule 0    Sig: Take 1 capsule (37.5 mg total) by mouth every morning.    Return in about 4 weeks (around 10/09/2023) for Follow-Up or next available weight check.  Greig JINNY Drones, NP

## 2023-09-12 NOTE — Progress Notes (Signed)
 Surgical Instructions   Your procedure is scheduled on Friday September 22, 2023. Report to [facility] Main Entrance A at 11:00 A.M., then check in with the Admitting office. Any questions or running late day of surgery: call [phone]  Questions prior to your surgery date: call [phone], Monday-Friday, 8am-4pm. If you experience any cold or flu symptoms such as cough, fever, chills, shortness of breath, etc. between now and your scheduled surgery, please notify us  at the above number.     Remember:  Do not eat after midnight the night before your surgery  You may drink clear liquids until 10:00 the morning of your surgery.   Clear liquids allowed are: Water , Non-Citrus Juices (without pulp), Carbonated Beverages, Clear Tea (no milk, honey, etc.), Black Coffee Only (NO MILK, CREAM OR POWDERED CREAMER of any kind), and Gatorade.  Patient Instructions  The night before surgery:  No food after midnight. ONLY clear liquids after midnight  The day of surgery (if you do NOT have diabetes):  Drink ONE (1) Pre-Surgery Clear Ensure by 10:00 the morning of surgery. Drink in one sitting. Do not sip.  This drink was given to you during your hospital  pre-op appointment visit.  Nothing else to drink after completing the  Pre-Surgery Clear Ensure.         If you have questions, please contact your surgeon's office.    Take these medicines the morning of surgery with A SIP OF WATER   amLODipine  (NORVASC )  atorvastatin  (LIPITOR)  pregabalin  (LYRICA )   May take these medicines IF NEEDED: albuterol  (VENTOLIN  HFA) 108 (90 Base) MCG/ACT inhaler.  Please bring with you to the hospital methocarbamol  (ROBAXIN )   PLEASE HOLD YOUR phentermine   FOR 2 WEEKS WITH THE LAST DOSE BEING NO LATER THAN 09/07/2023.     One week prior to surgery, STOP taking any Aspirin  (unless otherwise instructed by your surgeon) Aleve , Naproxen , Ibuprofen , Motrin , Advil , Goody's, BC's, all herbal medications, fish oil,  and non-prescription vitamins.  This includes your diclofenac  (VOLTAREN ).                       Do NOT Smoke (Tobacco/Vaping) for 24 hours prior to your procedure.  If you use a CPAP at night, you may bring your mask/headgear for your overnight stay.   You will be asked to remove any contacts, glasses, piercing's, hearing aid's, dentures/partials prior to surgery. Please bring cases for these items if needed.    Patients discharged the day of surgery will not be allowed to drive home, and someone needs to stay with them for 24 hours.  SURGICAL WAITING ROOM VISITATION Patients may have no more than 2 support people in the waiting area - these visitors may rotate.   Pre-op nurse will coordinate an appropriate time for 1 ADULT support person, who may not rotate, to accompany patient in pre-op.  Children under the age of 56 must have an adult with them who is not the patient and must remain in the main waiting area with an adult.  If the patient needs to stay at the hospital during part of their recovery, the visitor guidelines for inpatient rooms apply.  Please refer to the [facility] website for the visitor guidelines for any additional information.   If you received a COVID test during your pre-op visit  it is requested that you wear a mask when out in public, stay away from anyone that may not be feeling well and notify your surgeon if  you develop symptoms. If you have been in contact with anyone that has tested positive in the last 10 days please notify you surgeon.      Pre-operative 5 CHG Bathing Instructions   You can play a key role in reducing the risk of infection after surgery. Your skin needs to be as free of germs as possible. You can reduce the number of germs on your skin by washing with CHG (chlorhexidine  gluconate) soap before surgery. CHG is an antiseptic soap that kills germs and continues to kill germs even after washing.   DO NOT use if you have an allergy to  chlorhexidine /CHG or antibacterial soaps. If your skin becomes reddened or irritated, stop using the CHG and notify one of our RNs at [phone].   Please shower with the CHG soap starting 4 days before surgery using the following schedule:     Please keep in mind the following:  DO NOT shave, including legs and underarms, starting the day of your first shower.   Place clean sheets on your bed the day you start using CHG soap. Use a clean washcloth (not used since being washed) for each shower. DO NOT sleep with pets once you start using the CHG.   CHG Shower Instructions:  Wash your face and private area with normal soap. If you choose to wash your hair, wash first with your normal shampoo.  After you use shampoo/soap, rinse your hair and body thoroughly to remove shampoo/soap residue.  Turn the water  OFF and apply about 3 tablespoons (45 ml) of CHG soap to a CLEAN washcloth.  Apply CHG soap ONLY FROM YOUR NECK DOWN TO YOUR TOES (washing for 3-5 minutes)  DO NOT use CHG soap on face, private areas, open wounds, or sores.  Pay special attention to the area where your surgery is being performed.  If you are having back surgery, having someone wash your back for you may be helpful. Wait 2 minutes after CHG soap is applied, then you may rinse off the CHG soap.  Pat dry with a clean towel  Put on clean clothes/pajamas   If you choose to wear lotion, please use ONLY the CHG-compatible lotions that are listed below.  Additional instructions for the day of surgery: DO NOT APPLY any lotions, deodorants or perfumes.   Do not bring valuables to the hospital. [facility] is not responsible for any belongings/valuables. Do not wear nail polish, gel polish, artificial nails, or any other type of covering on natural nails (fingers and toes) Do not wear jewelry or makeup Put on clean/comfortable clothes.  Please brush your teeth.  Ask your nurse before applying any prescription medications to the  skin.     CHG Compatible Lotions   Aveeno Moisturizing lotion  Cetaphil Moisturizing Cream  Cetaphil Moisturizing Lotion  Clairol Herbal Essence Moisturizing Lotion, Dry Skin  Clairol Herbal Essence Moisturizing Lotion, Extra Dry Skin  Clairol Herbal Essence Moisturizing Lotion, Normal Skin  Curel Age Defying Therapeutic Moisturizing Lotion with Alpha Hydroxy  Curel Extreme Care Body Lotion  Curel Soothing Hands Moisturizing Hand Lotion  Curel Therapeutic Moisturizing Cream, Fragrance-Free  Curel Therapeutic Moisturizing Lotion, Fragrance-Free  Curel Therapeutic Moisturizing Lotion, Original Formula  Eucerin Daily Replenishing Lotion  Eucerin Dry Skin Therapy Plus Alpha Hydroxy Crme  Eucerin Dry Skin Therapy Plus Alpha Hydroxy Lotion  Eucerin Original Crme  Eucerin Original Lotion  Eucerin Plus Crme Eucerin Plus Lotion  Eucerin TriLipid Replenishing Lotion  Keri Anti-Bacterial Hand Lotion  Keri Deep Conditioning  Original Lotion Dry Skin Formula Softly Scented  Keri Deep Conditioning Original Lotion, Fragrance Free Sensitive Skin Formula  Keri Lotion Fast Absorbing Fragrance Free Sensitive Skin Formula  Keri Lotion Fast Absorbing Softly Scented Dry Skin Formula  Keri Original Lotion  Keri Skin Renewal Lotion Keri Silky Smooth Lotion  Keri Silky Smooth Sensitive Skin Lotion  Nivea Body Creamy Conditioning Oil  Nivea Body Extra Enriched Lotion  Nivea Body Original Lotion  Nivea Body Sheer Moisturizing Lotion Nivea Crme  Nivea Skin Firming Lotion  NutraDerm 30 Skin Lotion  NutraDerm Skin Lotion  NutraDerm Therapeutic Skin Cream  NutraDerm Therapeutic Skin Lotion  ProShield Protective Hand Cream  Provon moisturizing lotion  Please read over the following fact sheets that you were given.

## 2023-09-13 ENCOUNTER — Encounter (HOSPITAL_COMMUNITY): Payer: Self-pay

## 2023-09-13 ENCOUNTER — Encounter (HOSPITAL_COMMUNITY)
Admission: RE | Admit: 2023-09-13 | Discharge: 2023-09-13 | Disposition: A | Discharge status: Home / Self Care | Source: Ambulatory Visit | Attending: Orthopedic Surgery | Admitting: Orthopedic Surgery

## 2023-09-13 ENCOUNTER — Other Ambulatory Visit: Payer: Self-pay

## 2023-09-13 VITALS — BP 153/88 | HR 89 | Temp 97.9°F | Resp 18 | Ht 65.0 in | Wt 228.8 lb

## 2023-09-13 DIAGNOSIS — Z01818 Encounter for other preprocedural examination: Secondary | ICD-10-CM | POA: Diagnosis present

## 2023-09-13 DIAGNOSIS — I10 Essential (primary) hypertension: Secondary | ICD-10-CM | POA: Diagnosis not present

## 2023-09-13 DIAGNOSIS — R9431 Abnormal electrocardiogram [ECG] [EKG]: Secondary | ICD-10-CM | POA: Insufficient documentation

## 2023-09-13 LAB — CBC
HCT: 43.3 % (ref 36.0–46.0)
Hemoglobin: 14.1 g/dL (ref 12.0–15.0)
MCH: 27.1 pg (ref 26.0–34.0)
MCHC: 32.6 g/dL (ref 30.0–36.0)
MCV: 83.1 fL (ref 80.0–100.0)
Platelets: 333 K/uL (ref 150–400)
RBC: 5.21 MIL/uL — ABNORMAL HIGH (ref 3.87–5.11)
RDW: 13.3 % (ref 11.5–15.5)
WBC: 7.8 K/uL (ref 4.0–10.5)
nRBC: 0 % (ref 0.0–0.2)

## 2023-09-13 LAB — BASIC METABOLIC PANEL WITH GFR
Anion gap: 8 (ref 5–15)
BUN: 13 mg/dL (ref 6–20)
CO2: 29 mmol/L (ref 22–32)
Calcium: 9.3 mg/dL (ref 8.9–10.3)
Chloride: 102 mmol/L (ref 98–111)
Creatinine, Ser: 0.88 mg/dL (ref 0.44–1.00)
GFR, Estimated: >60 mL/min (ref >60)
Glucose, Bld: 103 mg/dL — ABNORMAL HIGH (ref 70–99)
Potassium: 3.8 mmol/L (ref 3.5–5.1)
Sodium: 139 mmol/L (ref 135–145)

## 2023-09-13 LAB — SURGICAL PCR SCREEN
MRSA, PCR: NEGATIVE
Staphylococcus aureus: NEGATIVE

## 2023-09-13 NOTE — Progress Notes (Signed)
 PCP - Greig Drones, NP Cardiologist - denies  PPM/ICD - denies   Chest x-ray - 06/12/15 EKG - 09/13/23 Stress Test - denies ECHO - denies Cardiac Cath - denies  Sleep Study - denies   DM- denies  Last dose of GLP1 agonist-  n/a   ASA/Blood Thinner Instructions: n/a   ERAS Protcol - clears until 1000 PRE-SURGERY G2 given (allergy to strawberry)  COVID TEST- n/a   Anesthesia review: no  Patient denies shortness of breath, fever, cough and chest pain at PAT appointment   All instructions explained to the patient, with a verbal understanding of the material. Patient agrees to go over the instructions while at home for a better understanding. The opportunity to ask questions was provided.

## 2023-09-19 ENCOUNTER — Other Ambulatory Visit (HOSPITAL_COMMUNITY)
Admission: RE | Admit: 2023-09-19 | Discharge: 2023-09-19 | Disposition: A | Discharge status: Home / Self Care | Source: Ambulatory Visit | Attending: Obstetrics and Gynecology | Admitting: Obstetrics and Gynecology

## 2023-09-19 ENCOUNTER — Other Ambulatory Visit: Payer: Self-pay | Admitting: Obstetrics and Gynecology

## 2023-09-19 DIAGNOSIS — Z01419 Encounter for gynecological examination (general) (routine) without abnormal findings: Secondary | ICD-10-CM | POA: Insufficient documentation

## 2023-09-19 DIAGNOSIS — Z1151 Encounter for screening for human papillomavirus (HPV): Secondary | ICD-10-CM | POA: Insufficient documentation

## 2023-09-21 ENCOUNTER — Other Ambulatory Visit: Payer: Self-pay | Admitting: Orthopedic Surgery

## 2023-09-21 NOTE — H&P (Signed)
 Orthopedic Spine Surgery H&P Note  Assessment: Patient is a 58 y.o. female with lumbar radiculopathy   Plan: -Out of bed as tolerated, activity as tolerated, no brace -Covered the risks of surgery one more time with the patient and patient elected to proceed with planned surgery -Written consent verified -Hold anticoagulation in anticipation of surgery -Ancef  and TXA on all to OR -NPO for procedure -Site marked -To OR when ready  The risks covered this morning included but were not limited to: iatrogenic instability, dural tear, nerve root injury, paralysis, persistent pain, infection, bleeding, heart attack, death, dvt/pe, fracture, and need for additional procedures. ___________________________________________________________________________  Chief Complaint: low back pain, left hip flexor weakness, bilateral thigh pain and paresthesias  History: Patient is 58 y.o. female who has been previously seen in the office for left hip flexor weakness and radiating leg pain. Work up was consistent with lumbar radiculopathy. Her symptoms failed to improve with conservative treatment so operative management was discussed at the last office visit. The patient presents today with no changes in her symptoms since the last office visit. See previous office note for further details.    Review of systems: General: denies fevers and chills, myalgias Neurologic: denies recent changes in vision, slurred speech Abdomen: denies nausea, vomiting, hematemesis Respiratory: denies cough, shortness of breath  Past medical history: Asthma Chronic pericarditis Hypertension Osteoarthritis   Allergies: NKDA   Past surgical history:  Left hip head and liner exchange Left THA Ectopic pregnancy surgery L3/4 and L4/5 laminectomies Tonsillectomy Right hand mass excision Right trigger thumb release Wisdom tooth extraction   Social history: Denies use of nicotine product (smoking, vaping, patches,  smokeless) Alcohol  use: yes, 1 drink per week Denies recreational drug use  Family history: -reviewed and not pertinent to lumbar radiculopathy   Physical Exam:  General: no acute distress, appears stated age Neurologic: alert, answering questions appropriately, following commands Cardiovascular: regular rate, no cyanosis Respiratory: unlabored breathing on room air, symmetric chest rise Psychiatric: appropriate affect, normal cadence to speech   MSK (spine):  -Strength exam      Left  Right  EHL    5/5  5/5 TA    5/5  5/5 GSC    5/5  5/5 Knee extension  5/5  5/5 Knee flexion   4+/5  5/5 Hip flexion   3/5  5/5  -Sensory exam    Sensation intact to light touch in L2-S1 nerve distributions of bilateral lower extremities   Patient name: Darlene Crosby Patient MRN: [id number] Date: 09/21/23

## 2023-09-22 ENCOUNTER — Ambulatory Visit (HOSPITAL_COMMUNITY)
Admission: RE | Admit: 2023-09-22 | Discharge: 2023-09-22 | Disposition: A | Discharge status: Home / Self Care | Attending: Orthopedic Surgery | Admitting: Orthopedic Surgery

## 2023-09-22 ENCOUNTER — Other Ambulatory Visit: Payer: Self-pay

## 2023-09-22 ENCOUNTER — Encounter (HOSPITAL_COMMUNITY)
Admission: RE | Disposition: A | Discharge status: Home / Self Care | Payer: Self-pay | Source: Home / Self Care | Attending: Orthopedic Surgery

## 2023-09-22 ENCOUNTER — Other Ambulatory Visit: Payer: Self-pay | Admitting: Orthopedic Surgery

## 2023-09-22 ENCOUNTER — Ambulatory Visit (HOSPITAL_COMMUNITY)

## 2023-09-22 ENCOUNTER — Encounter (HOSPITAL_COMMUNITY): Payer: Self-pay | Admitting: Orthopedic Surgery

## 2023-09-22 DIAGNOSIS — M4726 Other spondylosis with radiculopathy, lumbar region: Secondary | ICD-10-CM

## 2023-09-22 DIAGNOSIS — M5416 Radiculopathy, lumbar region: Secondary | ICD-10-CM | POA: Insufficient documentation

## 2023-09-22 DIAGNOSIS — Z01818 Encounter for other preprocedural examination: Secondary | ICD-10-CM

## 2023-09-22 DIAGNOSIS — M48061 Spinal stenosis, lumbar region without neurogenic claudication: Secondary | ICD-10-CM | POA: Diagnosis not present

## 2023-09-22 HISTORY — PX: DECOMPRESSIVE LUMBAR LAMINECTOMY LEVEL 2: SHX5792

## 2023-09-22 LAB — CYTOLOGY - PAP
Comment: NEGATIVE
Diagnosis: NEGATIVE
High risk HPV: NEGATIVE

## 2023-09-22 SURGERY — DECOMPRESSIVE LUMBAR LAMINECTOMY LEVEL 2
Anesthesia: General

## 2023-09-22 MED ORDER — VANCOMYCIN HCL 1000 MG IV SOLR
INTRAVENOUS | Status: AC
  Filled 2023-09-22: qty 20

## 2023-09-22 MED ORDER — PROPOFOL 10 MG/ML IV BOLUS
INTRAVENOUS | Status: DC | PRN
  Administered 2023-09-22: 150 mg via INTRAVENOUS

## 2023-09-22 MED ORDER — BUPIVACAINE-EPINEPHRINE 0.25% -1:200000 IJ SOLN
INTRAMUSCULAR | Status: DC | PRN
  Administered 2023-09-22: 30 mL

## 2023-09-22 MED ORDER — METHOCARBAMOL 500 MG PO TABS: 500.0000 mg | ORAL_TABLET | Freq: Four times a day (QID) | ORAL | 0 refills | Status: AC

## 2023-09-22 MED ORDER — ACETAMINOPHEN 500 MG PO TABS: 1000.0000 mg | ORAL_TABLET | Freq: Three times a day (TID) | ORAL | 0 refills | Status: AC

## 2023-09-22 MED ORDER — ONDANSETRON HCL 4 MG/2ML IJ SOLN
INTRAMUSCULAR | Status: DC | PRN
  Administered 2023-09-22: 4 mg via INTRAVENOUS

## 2023-09-22 MED ORDER — DROPERIDOL 2.5 MG/ML IJ SOLN: 0.6250 mg | Freq: Once | INTRAMUSCULAR | Status: DC | PRN

## 2023-09-22 MED ORDER — TRANEXAMIC ACID-NACL 1000-0.7 MG/100ML-% IV SOLN
INTRAVENOUS | Status: AC
  Filled 2023-09-22: qty 100

## 2023-09-22 MED ORDER — LACTATED RINGERS IV SOLN: INTRAVENOUS | Status: DC

## 2023-09-22 MED ORDER — OXYCODONE HCL 5 MG/5ML PO SOLN: 5.0000 mg | Freq: Once | ORAL | Status: AC | PRN

## 2023-09-22 MED ORDER — CHLORHEXIDINE GLUCONATE 0.12 % MT SOLN
15.0000 mL | Freq: Once | OROMUCOSAL | Status: AC
  Administered 2023-09-22: 15 mL via OROMUCOSAL

## 2023-09-22 MED ORDER — SODIUM CHLORIDE (PF) 0.9 % IJ SOLN
INTRAMUSCULAR | Status: AC
  Filled 2023-09-22: qty 10

## 2023-09-22 MED ORDER — HYDROMORPHONE HCL 1 MG/ML IJ SOLN
INTRAMUSCULAR | Status: DC | PRN
  Administered 2023-09-22 (×2): .25 mg via INTRAVENOUS

## 2023-09-22 MED ORDER — SENNA 8.6 MG PO TABS: 1.0000 | ORAL_TABLET | Freq: Two times a day (BID) | ORAL | 0 refills | Status: DC

## 2023-09-22 MED ORDER — TRANEXAMIC ACID-NACL 1000-0.7 MG/100ML-% IV SOLN
1000.0000 mg | INTRAVENOUS | Status: AC
  Administered 2023-09-22: 1000 mg via INTRAVENOUS

## 2023-09-22 MED ORDER — 0.9 % SODIUM CHLORIDE (POUR BTL) OPTIME
TOPICAL | Status: DC | PRN
  Administered 2023-09-22: 1000 mL

## 2023-09-22 MED ORDER — PHENYLEPHRINE 80 MCG/ML (10ML) SYRINGE FOR IV PUSH (FOR BLOOD PRESSURE SUPPORT)
PREFILLED_SYRINGE | INTRAVENOUS | Status: DC | PRN
  Administered 2023-09-22 (×5): 160 ug via INTRAVENOUS

## 2023-09-22 MED ORDER — PHENYLEPHRINE 80 MCG/ML (10ML) SYRINGE FOR IV PUSH (FOR BLOOD PRESSURE SUPPORT)
PREFILLED_SYRINGE | INTRAVENOUS | Status: AC
  Filled 2023-09-22: qty 10

## 2023-09-22 MED ORDER — ROCURONIUM BROMIDE 10 MG/ML (PF) SYRINGE
PREFILLED_SYRINGE | INTRAVENOUS | Status: DC | PRN
  Administered 2023-09-22: 10 mg via INTRAVENOUS
  Administered 2023-09-22: 60 mg via INTRAVENOUS

## 2023-09-22 MED ORDER — LIDOCAINE 2% (20 MG/ML) 5 ML SYRINGE
INTRAMUSCULAR | Status: AC
  Filled 2023-09-22: qty 5

## 2023-09-22 MED ORDER — CEFAZOLIN SODIUM-DEXTROSE 2-4 GM/100ML-% IV SOLN
2.0000 g | INTRAVENOUS | Status: AC
  Administered 2023-09-22: 2 g via INTRAVENOUS

## 2023-09-22 MED ORDER — ONDANSETRON HCL 4 MG/2ML IJ SOLN
INTRAMUSCULAR | Status: AC
  Filled 2023-09-22: qty 2

## 2023-09-22 MED ORDER — SUGAMMADEX SODIUM 200 MG/2ML IV SOLN
INTRAVENOUS | Status: AC
  Filled 2023-09-22: qty 2

## 2023-09-22 MED ORDER — VANCOMYCIN HCL 1000 MG IV SOLR
INTRAVENOUS | Status: DC | PRN
  Administered 2023-09-22: 1000 mg

## 2023-09-22 MED ORDER — POVIDONE-IODINE 10 % EX SWAB
2.0000 | Freq: Once | CUTANEOUS | Status: AC
Start: 2023-09-22 — End: 2023-09-22
  Administered 2023-09-22: 2 via TOPICAL

## 2023-09-22 MED ORDER — FENTANYL CITRATE (PF) 250 MCG/5ML IJ SOLN
INTRAMUSCULAR | Status: DC | PRN
  Administered 2023-09-22: 25 ug via INTRAVENOUS
  Administered 2023-09-22: 50 ug via INTRAVENOUS
  Administered 2023-09-22: 100 ug via INTRAVENOUS

## 2023-09-22 MED ORDER — LIDOCAINE 2% (20 MG/ML) 5 ML SYRINGE
INTRAMUSCULAR | Status: DC | PRN
  Administered 2023-09-22: 100 mg via INTRAVENOUS

## 2023-09-22 MED ORDER — ACETAMINOPHEN 10 MG/ML IV SOLN
INTRAVENOUS | Status: DC | PRN
  Administered 2023-09-22: 1000 mg via INTRAVENOUS

## 2023-09-22 MED ORDER — HYDROMORPHONE HCL 1 MG/ML IJ SOLN
INTRAMUSCULAR | Status: AC
  Filled 2023-09-22: qty 0.5

## 2023-09-22 MED ORDER — OXYCODONE HCL 5 MG PO TABS: 5.0000 mg | ORAL_TABLET | ORAL | 0 refills | Status: DC | PRN

## 2023-09-22 MED ORDER — MIDAZOLAM HCL 2 MG/2ML IJ SOLN
INTRAMUSCULAR | Status: AC
Start: 2023-09-22 — End: 2023-09-22
  Filled 2023-09-22: qty 2

## 2023-09-22 MED ORDER — SENNA 8.6 MG PO TABS: 1.0000 | ORAL_TABLET | Freq: Two times a day (BID) | ORAL | 0 refills | Status: AC

## 2023-09-22 MED ORDER — ACETAMINOPHEN 500 MG PO TABS: 1000.0000 mg | ORAL_TABLET | Freq: Three times a day (TID) | ORAL | 0 refills | Status: DC

## 2023-09-22 MED ORDER — MIDAZOLAM HCL 2 MG/2ML IJ SOLN
INTRAMUSCULAR | Status: DC | PRN
  Administered 2023-09-22: 2 mg via INTRAVENOUS

## 2023-09-22 MED ORDER — ACETAMINOPHEN 10 MG/ML IV SOLN
1000.0000 mg | Freq: Once | INTRAVENOUS | Status: DC | PRN
Start: 2023-09-22 — End: 2023-09-23

## 2023-09-22 MED ORDER — EPHEDRINE 5 MG/ML INJ
INTRAVENOUS | Status: AC
  Filled 2023-09-22: qty 5

## 2023-09-22 MED ORDER — ORAL CARE MOUTH RINSE: 15.0000 mL | Freq: Once | OROMUCOSAL | Status: AC

## 2023-09-22 MED ORDER — POLYETHYLENE GLYCOL 3350 17 G PO PACK: 17.0000 g | PACK | Freq: Every day | ORAL | 0 refills | Status: AC

## 2023-09-22 MED ORDER — OXYCODONE HCL 5 MG PO TABS: 5.0000 mg | ORAL_TABLET | ORAL | 0 refills | Status: AC | PRN

## 2023-09-22 MED ORDER — ROCURONIUM BROMIDE 10 MG/ML (PF) SYRINGE
PREFILLED_SYRINGE | INTRAVENOUS | Status: AC
  Filled 2023-09-22: qty 10

## 2023-09-22 MED ORDER — SUGAMMADEX SODIUM 200 MG/2ML IV SOLN
INTRAVENOUS | Status: DC | PRN
  Administered 2023-09-22: 200 mg via INTRAVENOUS

## 2023-09-22 MED ORDER — DEXAMETHASONE SODIUM PHOSPHATE 10 MG/ML IJ SOLN
INTRAMUSCULAR | Status: AC
  Filled 2023-09-22: qty 1

## 2023-09-22 MED ORDER — FENTANYL CITRATE (PF) 100 MCG/2ML IJ SOLN: 25.0000 ug | INTRAMUSCULAR | Status: DC | PRN

## 2023-09-22 MED ORDER — CEFAZOLIN SODIUM-DEXTROSE 2-4 GM/100ML-% IV SOLN
INTRAVENOUS | Status: AC
  Filled 2023-09-22: qty 100

## 2023-09-22 MED ORDER — OXYCODONE HCL 5 MG PO TABS
5.0000 mg | ORAL_TABLET | Freq: Once | ORAL | Status: AC | PRN
  Administered 2023-09-22: 5 mg via ORAL

## 2023-09-22 MED ORDER — POLYETHYLENE GLYCOL 3350 17 G PO PACK: 17.0000 g | PACK | Freq: Every day | ORAL | 0 refills | Status: DC

## 2023-09-22 MED ORDER — METHOCARBAMOL 500 MG PO TABS: 500.0000 mg | ORAL_TABLET | Freq: Four times a day (QID) | ORAL | 0 refills | Status: DC

## 2023-09-22 MED ORDER — DEXAMETHASONE SODIUM PHOSPHATE 10 MG/ML IJ SOLN
10.0000 mg | Freq: Once | INTRAMUSCULAR | Status: AC
  Administered 2023-09-22: 10 mg via INTRAVENOUS

## 2023-09-22 MED ORDER — FENTANYL CITRATE (PF) 250 MCG/5ML IJ SOLN
INTRAMUSCULAR | Status: AC
  Filled 2023-09-22: qty 5

## 2023-09-22 MED ORDER — OXYCODONE HCL 5 MG PO TABS
ORAL_TABLET | ORAL | Status: AC
  Filled 2023-09-22: qty 1

## 2023-09-22 MED ORDER — PROPOFOL 10 MG/ML IV BOLUS
INTRAVENOUS | Status: AC
  Filled 2023-09-22: qty 20

## 2023-09-22 MED ORDER — BUPIVACAINE-EPINEPHRINE (PF) 0.25% -1:200000 IJ SOLN
INTRAMUSCULAR | Status: AC
  Filled 2023-09-22: qty 30

## 2023-09-22 SURGICAL SUPPLY — 34 items
BLADE CLIPPER SURG (BLADE) IMPLANT
BUR MATCHSTICK NEURO 3.0 LAGG (BURR) ×2 IMPLANT
CANISTER SUCTION 3000ML PPV (SUCTIONS) ×2 IMPLANT
COVER MAYO STAND STRL (DRAPES) ×6 IMPLANT
COVER SURGICAL LIGHT HANDLE (MISCELLANEOUS) ×2 IMPLANT
DERMABOND ADVANCED .7 DNX12 (GAUZE/BANDAGES/DRESSINGS) ×2 IMPLANT
DRAPE C-ARM 42X72 X-RAY (DRAPES) ×2 IMPLANT
DRAPE UTILITY XL STRL (DRAPES) ×4 IMPLANT
DRESSING MEPILEX FLEX 4X4 (GAUZE/BANDAGES/DRESSINGS) ×2 IMPLANT
DRSG MEPILEX POST OP 4X8 (GAUZE/BANDAGES/DRESSINGS) ×2 IMPLANT
DRSG TEGADERM 4X4.75 (GAUZE/BANDAGES/DRESSINGS) ×4 IMPLANT
DURAPREP 26ML APPLICATOR (WOUND CARE) ×2 IMPLANT
ELECT COATED BLADE 2.86 ST (ELECTRODE) ×2 IMPLANT
ELECT PENCIL ROCKER SW 15FT (MISCELLANEOUS) ×2 IMPLANT
ELECTRODE REM PT RTRN 9FT ADLT (ELECTROSURGICAL) ×2 IMPLANT
GAUZE SPONGE 4X4 12PLY STRL (GAUZE/BANDAGES/DRESSINGS) ×2 IMPLANT
GLOVE INDICATOR 7.5 STRL GRN (GLOVE) ×2 IMPLANT
GLOVE SS BIOGEL STRL SZ 7.5 (GLOVE) ×2 IMPLANT
GOWN STRL REUS W/ TWL LRG LVL3 (GOWN DISPOSABLE) ×2 IMPLANT
GOWN STRL SURGICAL XL XLNG (GOWN DISPOSABLE) ×2 IMPLANT
KIT BASIN OR (CUSTOM PROCEDURE TRAY) ×2 IMPLANT
KIT POSITIONER JACKSON TABLE (MISCELLANEOUS) ×2 IMPLANT
KIT TURNOVER KIT B (KITS) ×2 IMPLANT
NS IRRIG 1000ML POUR BTL (IV SOLUTION) ×2 IMPLANT
PACK LAMINECTOMY ORTHO (CUSTOM PROCEDURE TRAY) ×2 IMPLANT
SUCTION TUBE FRAZIER 10FR DISP (SUCTIONS) ×2 IMPLANT
SUT BONE WAX W31G (SUTURE) ×2 IMPLANT
SUT MNCRL AB 3-0 PS2 27 (SUTURE) ×2 IMPLANT
SUT VIC AB 0 CT1 18XCR BRD8 (SUTURE) ×2 IMPLANT
SUT VIC AB 2-0 CT1 18 (SUTURE) ×2 IMPLANT
TOWEL GREEN STERILE (TOWEL DISPOSABLE) ×2 IMPLANT
TOWEL GREEN STERILE FF (TOWEL DISPOSABLE) ×2 IMPLANT
TUBING FEATHERFLOW (TUBING) ×2 IMPLANT
WATER STERILE IRR 1000ML POUR (IV SOLUTION) ×2 IMPLANT

## 2023-09-22 NOTE — Discharge Instructions (Signed)
 Orthopedic Surgery Discharge Instructions  Patient name: Darlene Crosby Procedure Performed: L2/3 laminectomy Date of Surgery: 09/22/2023 Surgeon: Ozell Ada, MD  Pre-operative Diagnosis: lumbar radiculopathy Post-operative Diagnosis: same as above  Discharge Date: 09/22/2023 Discharged to: home Discharge Condition: stable  Activity: You should refrain from bending, lifting, or twisting with objects greater than ten pounds until six weeks after surgery. You are encouraged to walk as much as desired. You can perform household activities such as cleaning dishes, doing laundry, vacuuming, etc. as long as the ten-pound restriction is followed. You do not need to wear a brace during the post-operative period.   Incision Care: Your incision site has a dressing over it. That dressing should remain in place and dry at all times for a total of one week after surgery. After one week, you can remove the dressing. Underneath the dressing, you will find skin glue. You should leave the skin glue in place. It will fall off with time. Do not pick, rub, or scrub at it. Do not put cream or lotion over the surgical area. After one week and once the dressing is off, it is okay to let soap and water  run over your incision. Again, do not pick, scrub, or rub at the skin glue when bathing. Do not submerge (e.g., take a bath, swim, go in a hot tub, etc.) until six weeks after surgery. There may be some bloody drainage from the incision into the dressing after surgery. This is normal. You do not need to replace the dressing. Continue to leave it in place for the one week as instructed above. Should the dressing become saturated with blood or drainage, please call the office for further instructions.   Medications: You have been prescribed oxycodone . This is a narcotic pain medication and should only be taken as prescribed. You should not drink alcohol  or operate heavy machinery (including driving) while taking this  medication. The oxycodone  can cause constipation as a side effect. For that reason, you have been prescribed senna and miralax . These are both laxatives. You do not need to take this medication if you develop diarrhea. Should you remain constipated even while taking these medications, please increase the dose of miralax  to twice daily. Tylenol  has been prescribed to be taken every 8 hours, which will give you additional pain relief. Robaxin  is a muscle relaxer that has been prescribed to you for muscle spasm type pain. Take this medication as needed.   Do not take NSAIDs (ibuprofen , diclofenac , Aleve , Celebrex, naproxen , meloxicam , etc.) for the first 72 hours after surgery as these can thin the blood. You are okay to resume taking these pain medications for additional pain relief after 72 hours from surgery.  These medications are safe to take with the Tylenol  you have been prescribed. You should not take these medications if you have or have had kidney problems or gastrointestinal ulcers. Take these medications as instructed on the packaging.   In order to set expectations for opioid prescriptions, you will only be prescribed opioids for a total of six weeks after surgery and, at two-weeks after surgery, your opioid prescription will start to tapered (decreased dosage and number of pills). If you have ongoing need for opioid medication six weeks after surgery, you will be referred to pain management. If you are already established with a provider that is giving you opioid medications, you should schedule an appointment with them for six weeks after surgery if you feel you are going to need another prescription. State  law only allows for opioid prescriptions one week at a time. If you are running out of opioid medication near the end of the week, please call the office during business hours before running out so I can send you another prescription.   Driving: You should not drive while taking narcotic pain  medications. You should start getting back to driving slowly and you may want to try driving in a parking lot before doing anything more.   Diet: You are safe to resume your regular diet after surgery.   Reasons to Call the Office After Surgery: You should feel free to call the office with any concerns or questions you have in the post-operative period, but you should definitely notify the office if you develop: -shortness of breath, chest pain, or trouble breathing -excessive bleeding, drainage, redness, or swelling around the surgical site -fevers, chills, or pain that is getting worse with each passing day -persistent nausea or vomiting -new weakness in either leg -new or worsening numbness or tingling in either leg -numbness in the groin, bowel or bladder incontinence -other concerns about your surgery  Follow Up Appointments: You should have an office appointment scheduled for approximately two weeks after surgery. If you do not remember when this appointment is or do not already have it scheduled, please call the office to schedule.   Office Information:  -Ozell Ada, MD -Phone number: [phone] -Address: [address]       [place], [place] [address]

## 2023-09-22 NOTE — Anesthesia Preprocedure Evaluation (Signed)
 Anesthesia Evaluation  Patient identified by MRN, date of birth, ID band Patient awake    Reviewed: Allergy & Precautions, H&P , NPO status , Patient's Chart, lab work & pertinent test results  History of Anesthesia Complications Negative for: history of anesthetic complications  Airway Mallampati: II  TM Distance: >3 FB Neck ROM: Full    Dental no notable dental hx.    Pulmonary asthma    Pulmonary exam normal breath sounds clear to auscultation       Cardiovascular hypertension, (-) Past MI Normal cardiovascular exam Rhythm:Regular Rate:Normal     Neuro/Psych Lumbar radiculopathy  negative psych ROS   GI/Hepatic negative GI ROS, Neg liver ROS,,,  Endo/Other  negative endocrine ROS    Renal/GU negative Renal ROS  negative genitourinary   Musculoskeletal  (+) Arthritis ,    Abdominal   Peds negative pediatric ROS (+)  Hematology negative hematology ROS (+)   Anesthesia Other Findings   Reproductive/Obstetrics negative OB ROS                              Anesthesia Physical Anesthesia Plan  ASA: 2  Anesthesia Plan: General   Post-op Pain Management: Ofirmev  IV (intra-op)*   Induction: Intravenous  PONV Risk Score and Plan: 3 and Ondansetron , Dexamethasone  and Treatment may vary due to age or medical condition  Airway Management Planned: Oral ETT  Additional Equipment: None  Intra-op Plan:   Post-operative Plan: Extubation in OR  Informed Consent: I have reviewed the patients History and Physical, chart, labs and discussed the procedure including the risks, benefits and alternatives for the proposed anesthesia with the patient or authorized representative who has indicated his/her understanding and acceptance.     Dental advisory given  Plan Discussed with: CRNA  Anesthesia Plan Comments:          Anesthesia Quick Evaluation

## 2023-09-22 NOTE — Op Note (Signed)
 Orthopedic Spine Surgery Operative Report  Procedure: L2 and L3 segment lumbar laminectomies with partial medial facetectomies  Modifier: none  Date of procedure: 09/22/2023  Patient name: Darlene Crosby MRN: [id number] DOB: 10-26-1965  Surgeon: Ozell Ada, MD Assistant: none Pre-operative diagnosis: lumbar stenosis, lumbar radiculopathy Post-operative diagnosis: same as above Findings: L2/3 hypertrophic facets and thickened ligamentum flavum  Specimens: none Anesthesia: general EBL: 50cc Complications: none Pre-incision antibiotic: ancef  TXA was given prior to incision as well  Implants: none   Indication for procedure: Patient is a 58 y.o. female who presented to the office with symptoms consistent with lumbar radiculopathy. The patient had significant lower extremity weakness. As result, operative management was discussed. L2 and L3 segment laminectomies with partial medial facetectomies was presented as a treatment option. The risks including but not limited to iatrogenic instability, dural tear, nerve root injury, paralysis, persistent pain, infection, bleeding, heart attack, death, fracture, and need for additional procedures were discussed with the patient. The benefit of the surgery would be to prevent further neurologic deficits and to hopefully improve her current deficits. The alternatives to surgical management were covered with the patient and included continued monitoring, physical therapy, over-the-counter pain medications, ambulatory aids, and activity modification. All the patient's questions were answered to her satisfaction. After this discussion, the patient expressed understanding and elected to proceed with surgical intervention.   Procedure Description: The patient was met in the pre-operative holding area. The patient's identity and consent were verified. The operative site was marked. The patient's remaining questions about the surgery were answered. The  patient was brought back to the operating room. General anesthesia was induced and an endotracheal tube was placed by the anesthesia staff. The patient was transferred to the prone Ono table in the prone position. All bony prominences were well padded. The head of the bed was slightly elevated and the eyes were free from compression by the face pillow. The surgical area was cleansed with alcohol . Fluoroscopy was then brought in to check rotation on the AP image and to mark the levels on the lateral image. The patient's skin was then prepped and draped in a standard, sterile fashion. A time out was performed that identified the patient, the procedure, and the operative levels. All team members agreed with what was stated in the time out.   A midline incision over the spinous processes of the previously marked levels was made and sharp dissection was continued down through the skin and dermis. Electrocautery was then used to continue the midline dissection down to the level of the spinous process. Subperiosteal dissection was performed using electrocautery to expose the lamina out lateral to the facet joint capsule. Care was taken to not violate the facet joint capsules. A lateral fluoroscopic image was taken to confirm the level. Subperiosteal dissection with electrocautery was then done to expose the lamina and pars at L3 and the lamia of L2. Again, care was taken to avoid disruption of the facet capsules.    A rongeur was used to remove the spinous processes and interspinous ligaments between the L1/2 interspinous ligament to the scar tissue in the area of the former L3 spinous process. Bone wax was used to obtain hemostasis at the bleeding bony surfaces. A high-speed burr was used to thin the lamina at L2 and the proximal aspect of the remaining L3 lamina to the level of the ligamentum flavum. Above the level of the ligamentum, the lamina was thinned with the burr to the approximate level of the  ligamentum. Care was taken to leave at least 8mm of pars interarticularis on each side. A series of Kerrison rongeurs were used to remove the remaining lamina and ligamentum overlying the thecal sac. Next, a woodsen was used to protect the thecal sac as part of the inferior facet of L2 and part of the superior facet of L3 was removed with a kerrison on the left. The same process was then repeated on the right.   A woodsen was placed into the laminectomy site to palpate for any remaining areas of stenosis. The woodsen was able to be passed freely around the thecal sac with no further areas of stenosis felt. A lateral fluoroscopic film was taken with a woodsen at the cranial aspect of the laminectomy and then another image was taken at the caudal aspect.   The wound was copiously irrigated with sterile saline. 1g of vancomycin  powder was placed into the wound. The fascia was reapproximated with 0 vicryl suture. The subcutaneous fat was reapproximated with 0 vicryl suture. The deep dermal layer was reapproximated with 2-0 vicryl. The skin as closed with a 3-0 running monocryl. All counts were correct at the end of the case. Dermabond was applied over the skin. An island dressing was placed over the wound. The patient was transferred back to a bed and brought to the post-anesthesia care unit by anesthesia staff in stable condition.  Post-operative plan: The patient will recover in the post-anesthesia care unit with plan to go home. She will need to have her pain under control, a stable neurologic exam from pre-op, void spontaneously, and ambulate prior to discharge. The patient will be out of bed as tolerated with no brace. The patient will be seen in the office in approximately 2 weeks.      Ozell Ada, MD Orthopedic Surgeon

## 2023-09-22 NOTE — Progress Notes (Signed)
 Orthopedic Surgery Post-operative Progress Note  Assessment: Patient is a 58 y.o. female who is recovering in PACU after L2/3 laminectomy   Plan: -Operative plans complete -Drain: none -Out of bed as tolerated, no brace -No bending/lifting/twisting greater than 10 pounds -Pain control -Regular diet -No chemoprophylaxis for dvt or antiplatelets for 72 hours after surgery -Anticipate discharge to home from PACU  ___________________________________________________________________________   Subjective: No acute events since surgery. Recovering in PACU. Pain controlled.   Objective:  General: no acute distress, appropriate affect Neurologic: alert, answering questions appropriately, following commands Respiratory: unlabored breathing on room air Skin: dressing clear/dry/intact  MSK (spine):  -Strength exam      Right  Left  EHL    5/5  5/5 TA    5/5  5/5 GSC    5/5  5/5 Knee extension  5/5  4+/5 Hip flexion   5/5  3/5  -Sensory exam    Sensation intact to light touch in L2-S1 nerve distributions of bilateral lower extremities   Patient name: Darlene Crosby Patient MRN: [id number] Date: 09/22/23

## 2023-09-22 NOTE — Anesthesia Procedure Notes (Signed)
 Procedure Name: Intubation Date/Time: 09/22/2023 2:34 PM  Performed by: Rydell Wiegel J, CRNAPre-anesthesia Checklist: Patient identified, Emergency Drugs available, Suction available and Patient being monitored Patient Re-evaluated:Patient Re-evaluated prior to induction Oxygen Delivery Method: Circle System Utilized Preoxygenation: Pre-oxygenation with 100% oxygen Induction Type: IV induction Ventilation: Mask ventilation without difficulty Laryngoscope Size: Miller and 3 Grade View: Grade I Tube type: Oral Tube size: 7.0 mm Number of attempts: 1 Airway Equipment and Method: Stylet and Oral airway Placement Confirmation: ETT inserted through vocal cords under direct vision, positive ETCO2 and breath sounds checked- equal and bilateral Secured at: 21 cm Tube secured with: Tape Dental Injury: Teeth and Oropharynx as per pre-operative assessment

## 2023-09-22 NOTE — Transfer of Care (Signed)
 Immediate Anesthesia Transfer of Care Note  Patient: Darlene Crosby  Procedure(s) Performed: DECOMPRESSIVE LUMBAR LAMINECTOMY LEVEL 2  Patient Location: PACU  Anesthesia Type:General  Level of Consciousness: awake and alert   Airway & Oxygen Therapy: Patient Spontanous Breathing and Patient connected to face mask oxygen  Post-op Assessment: Report given to RN and Post -op Vital signs reviewed and stable  Post vital signs: Reviewed and stable  Last Vitals:  Vitals Value Taken Time  BP 137/70 09/22/23 17:48  Temp    Pulse 99 09/22/23 17:53  Resp 15 09/22/23 17:52  SpO2 90 % 09/22/23 17:53  Vitals shown include unfiled device data.  Last Pain:  Vitals:   09/22/23 1144  TempSrc:   PainSc: 7       Patients Stated Pain Goal: 4 (09/22/23 1144)  Complications: No notable events documented.

## 2023-09-23 NOTE — Anesthesia Postprocedure Evaluation (Signed)
 Anesthesia Post Note  Patient: Darlene Crosby  Procedure(s) Performed: DECOMPRESSIVE LUMBAR LAMINECTOMY LEVEL 2     Patient location during evaluation: PACU Anesthesia Type: General Level of consciousness: awake and alert Pain management: pain level controlled Vital Signs Assessment: post-procedure vital signs reviewed and stable Respiratory status: spontaneous breathing, nonlabored ventilation, respiratory function stable and patient connected to nasal cannula oxygen Cardiovascular status: blood pressure returned to baseline and stable Postop Assessment: no apparent nausea or vomiting Anesthetic complications: no   No notable events documented.  Last Vitals:  Vitals:   09/22/23 1815 09/22/23 1830  BP: 127/82 133/89  Pulse: 92 94  Resp: 20 14  Temp:  36.7 C  SpO2: 92% 94%    Last Pain:  Vitals:   09/22/23 1815  TempSrc:   PainSc: 0-No pain                 Thom JONELLE Peoples

## 2023-09-24 ENCOUNTER — Encounter (HOSPITAL_COMMUNITY): Payer: Self-pay | Admitting: Orthopedic Surgery

## 2023-09-30 ENCOUNTER — Encounter: Payer: Self-pay | Admitting: Gastroenterology

## 2023-10-05 ENCOUNTER — Encounter: Payer: Self-pay | Admitting: Gastroenterology

## 2023-10-05 ENCOUNTER — Ambulatory Visit (INDEPENDENT_AMBULATORY_CARE_PROVIDER_SITE_OTHER): Admitting: Orthopedic Surgery

## 2023-10-05 DIAGNOSIS — Z9889 Other specified postprocedural states: Secondary | ICD-10-CM

## 2023-10-05 MED ORDER — OXYCODONE HCL 5 MG PO TABS
5.0000 mg | ORAL_TABLET | ORAL | 0 refills | Status: AC | PRN
Start: 2023-10-05 — End: 2023-10-12

## 2023-10-05 NOTE — Progress Notes (Signed)
 Orthopedic Surgery Post-operative Office Visit  Procedure: L2/3 laminectomy Date of Surgery: 09/22/2023 (~2 weeks post-op)  Assessment: Patient is a 58 y.o. who is doing well after surgery   Plan: -Operative plans complete -Out of bed as tolerated, no brace -No bending/lifting/twisting greater than 10 pounds -Okay to let soap/water  run over the incision but do not submerge -Pain management: weaning oxycodone  -Return to office in 4 weeks, x-rays needed at next visit: none  ___________________________________________________________________________   Subjective: Patient has been doing well since discharge from the hospital.  She has been ambulating with a walker.  Her pain has been getting better.  She is just using oxycodone  at night.  She is using Tylenol  during the day to control her pain.  She has not noticed any redness or drainage around her incision.  She has noticed improvement in her left lower extremity strength.  Objective:  General: no acute distress, appropriate affect Neurologic: alert, answering questions appropriately, following commands Respiratory: unlabored breathing on room air Skin: incision is well-approximated with no erythema, induration, active/expressible drainage  MSK (spine):  -Strength exam      Left  Right  EHL    5/5  5/5 TA    5/5  5/5 GSC    5/5  5/5 Knee extension  5/5  5/5 Hip flexion   4/5  5/5  -Sensory exam    Sensation intact to light touch in L3-S1 nerve distributions of bilateral lower extremities  Imaging: None obtained at today's visit   Patient name: Darlene Crosby Patient MRN: [id number] Date of visit: 10/05/23

## 2023-10-11 ENCOUNTER — Encounter: Payer: Self-pay | Admitting: Orthopaedic Surgery

## 2023-10-11 ENCOUNTER — Other Ambulatory Visit (INDEPENDENT_AMBULATORY_CARE_PROVIDER_SITE_OTHER): Payer: Self-pay

## 2023-10-11 ENCOUNTER — Ambulatory Visit (INDEPENDENT_AMBULATORY_CARE_PROVIDER_SITE_OTHER): Admitting: Orthopaedic Surgery

## 2023-10-11 DIAGNOSIS — M79641 Pain in right hand: Secondary | ICD-10-CM | POA: Diagnosis not present

## 2023-10-11 NOTE — Progress Notes (Signed)
 Office Visit Note   Patient: Darlene Crosby           Date of Birth: 04/01/1965           MRN: [id number] Visit Date: 10/11/2023              Requested by: Lorren Greig PARAS, NP [address] [place],  [place] [address] PCP: Lorren Greig PARAS, NP   Assessment & Plan: Visit Diagnoses:  1. Pain in right hand     Plan: Patient has evidence of third MCP degenerative changes on physical exam and plain film x-rays.  Patient is yet to have any kind of autoimmune workup.  Will order arthritis panel including rheumatoid factor, ANA, ANA with titer and refer to orthopedic hand specialist with Dr. Erwin.  Patient agrees with treatment plan has no further questions at this time.  Follow-Up Instructions: No follow-ups on file.   Orders:  Orders Placed This Encounter  Procedures   XR Hand Complete Right   Uric acid   Sedimentation rate   ANA   Rheumatoid Factor   Ambulatory referral to Orthopedic Surgery   No orders of the defined types were placed in this encounter.     Procedures: No procedures performed   Clinical Data: No additional findings.   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  Previously being seen by Dr. Prentice Charter at [facility].  Has undergone 4 steroid injections for pain which helps for weeks at a time but pain always returns to same location.  Patient denies any injury. Endorses TTP over 3rd MCP joint.   Patient denies any neuropathy or weakness.   Review of Systems  Negative accept as noted above   Objective: Vital Signs: LMP 05/04/2012     Ortho Exam  On inspection patient has edema present over her 3rd MTP joint. No evidence or erythema, warmth, or ecchymosis. TTP over 3 MCP joint. Normal strength with intrinsic hand musculature. Neurovascularly intact. No evidence of neuropathy on  special testing.    Specialty Comments:  No specialty comments available.  Imaging: XR Hand Complete Right Result Date: 10/11/2023 X-rays of the right hand show degenerative changes of the third MCP joint.  No acute abnormalities.    PMFS History: 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 04/12/2022   Status post lumbar laminectomy 09/20/2021   Body mass index (BMI) 37.0-37.9, adult 03/14/2019   Lumbar stenosis without neurogenic claudication 03/14/2019   Hyperlipidemia 02/08/2019   Lumbar spondylosis 10/25/2018   Degeneration of lumbar intervertebral disc 10/01/2018   Surgical wound dehiscence 06/06/2018   Osteoarthritis of left hip 04/25/2018   Spinal stenosis of lumbar region 02/23/2018   Pain in left knee 01/10/2018   Trigger thumb of right hand 03/30/2017   Symptomatic mammary hypertrophy 11/28/2014   Dyspnea 03/26/2013   Asthma vs VCD  02/28/2013   Hypertensive disorder 06/07/2012   Pyogenic granuloma 02/15/2012   Past Medical History:  Diagnosis Date   Asthma    followed by pcp   Bronchitis    chronic   Hypertension    OA (osteoarthritis)    left knee, right shoulder, left hip   Pneumonia    a couple years ago per  pt on 09/15/21   Seasonal allergies    Wears glasses     Family History  Problem Relation Age of Onset   Hypertension Mother    Asthma Mother    Allergies Mother    Breast cancer Sister    Heart disease Sister    Hypertension Sister    Stroke Sister    Diabetes Sister    Colon cancer Neg Hx    Esophageal cancer Neg Hx    Stomach cancer Neg Hx    Pancreatic cancer Neg Hx     Past Surgical History:  Procedure Laterality Date   ANTERIOR HIP REVISION Left 06/06/2018   Procedure: LEFT HIP WOUND DEHISCENCE POSSIBLE HEAD AND LINER EXCHANGE;  Surgeon: Fidel Rogue, MD;  Location: [facility];  Service: Orthopedics;   Laterality: Left;   COLONOSCOPY  2015   DECOMPRESSIVE LUMBAR LAMINECTOMY LEVEL 2 N/A 09/22/2023   Procedure: DECOMPRESSIVE LUMBAR LAMINECTOMY LEVEL 2;  Surgeon: Georgina Ozell LABOR, MD;  Location: [facility];  Service: Orthopedics;  Laterality: N/A;  Lumbar 2 and Lumbar 3 segmental laminectomies with partial medial facetectomies   DIAGNOSTIC LAPAROSCOPY     tubal preg-took ovary and tube   LAPAROSCOPY FOR ECTOPIC PREGNANCY  1990s   LEFT SALPINGECTOMY AND RIGHT OOPHORECTOMY FOR ABNORMALITY   LUMBAR LAMINECTOMY N/A 09/20/2021   Procedure: L3-4 AND L4-5 CENTRAL LUMBAR LAMINECTOMIES;  Surgeon: Lucilla Lynwood BRAVO, MD;  Location: [facility];  Service: Orthopedics;  Laterality: N/A;   MASS EXCISION  02/15/2012   Procedure: EXCISION MASS;  Surgeon: Donnice LABOR Robinsons, MD;  Location: [facility];  Service: Orthopedics;  Laterality: Right;  Excision of Right Small Volar Mass   MYELOGRAM     TONSILLECTOMY  age 59   TOTAL HIP ARTHROPLASTY Left 04/25/2018   Procedure: TOTAL HIP ARTHROPLASTY ANTERIOR APPROACH;  Surgeon: Fidel Rogue, MD;  Location: [facility];  Service: Orthopedics;  Laterality: Left;   TRIGGER FINGER RELEASE Right 2018   thumb   WISDOM TOOTH EXTRACTION  age 70   Social History   Occupational History   Not on file  Tobacco Use   Smoking status: Never    Passive exposure: Never   Smokeless tobacco: Never  Vaping Use   Vaping status: Never Used  Substance and Sexual Activity   Alcohol  use: Yes    Alcohol /week: 1.0 standard drink of alcohol     Types: 1 Glasses of wine per week   Drug use: Never   Sexual activity: Not on file

## 2023-10-14 LAB — RHEUMATOID FACTOR: Rheumatoid fact SerPl-aCnc: 11 [IU]/mL (ref <14)

## 2023-10-14 LAB — SEDIMENTATION RATE: Sed Rate: 25 mm/h (ref 0–30)

## 2023-10-14 LAB — ANTI-NUCLEAR AB-TITER (ANA TITER)
ANA TITER: 1:40 {titer} — ABNORMAL HIGH
ANA TITER: 1:40 {titer} — ABNORMAL HIGH
ANA Titer 1: 1:40 {titer} — ABNORMAL HIGH

## 2023-10-14 LAB — URIC ACID: Uric Acid, Serum: 4.7 mg/dL (ref 2.5–7.0)

## 2023-10-14 LAB — ANA: Anti Nuclear Antibody (ANA): POSITIVE — AB

## 2023-10-17 ENCOUNTER — Ambulatory Visit (INDEPENDENT_AMBULATORY_CARE_PROVIDER_SITE_OTHER): Admitting: Family

## 2023-10-17 ENCOUNTER — Encounter: Payer: Self-pay | Admitting: Family

## 2023-10-17 VITALS — BP 131/82 | HR 84 | Temp 97.6°F | Resp 16 | Ht 65.0 in | Wt 227.4 lb

## 2023-10-17 DIAGNOSIS — R238 Other skin changes: Secondary | ICD-10-CM

## 2023-10-17 DIAGNOSIS — Z7689 Persons encountering health services in other specified circumstances: Secondary | ICD-10-CM

## 2023-10-17 DIAGNOSIS — E785 Hyperlipidemia, unspecified: Secondary | ICD-10-CM | POA: Diagnosis not present

## 2023-10-17 DIAGNOSIS — I10 Essential (primary) hypertension: Secondary | ICD-10-CM | POA: Diagnosis not present

## 2023-10-17 DIAGNOSIS — Z6837 Body mass index (BMI) 37.0-37.9, adult: Secondary | ICD-10-CM

## 2023-10-17 MED ORDER — ATORVASTATIN CALCIUM 20 MG PO TABS: 20.0000 mg | ORAL_TABLET | Freq: Every day | ORAL | 0 refills | Status: AC

## 2023-10-17 MED ORDER — PHENTERMINE HCL 37.5 MG PO CAPS: 37.5000 mg | ORAL_CAPSULE | ORAL | 0 refills | Status: DC

## 2023-10-17 MED ORDER — MUPIROCIN CALCIUM 2 % EX CREA: 1.0000 | TOPICAL_CREAM | Freq: Two times a day (BID) | CUTANEOUS | 1 refills | Status: AC

## 2023-10-17 MED ORDER — AMLODIPINE BESYLATE 5 MG PO TABS: 5.0000 mg | ORAL_TABLET | Freq: Every day | ORAL | 0 refills | Status: DC

## 2023-10-17 NOTE — Progress Notes (Signed)
 Patient ID: Darlene Crosby, female    DOB: 29-Jul-1965  MRN: [id number]  CC: Chronic Conditions Follow-Up  Subjective: Darlene Crosby is a 58 y.o. female who presents for chronic conditions follow-up.   Her concerns today include:  - Doing well on Amlodipine , no issues/concerns. She does not complain of red flag symptoms such as but not limited to chest pain, shortness of breath, worst headache of life, nausea/vomiting.  - Doing well on Atorvastatin , no issues/concerns.  - States she did not take Phentermine  for several days due to recent lumbar laminectomy. States has been 1 month since surgery and ready to restart Phentermine .  - States she thinks she left her heating pad on her back too long and now has a small boil. Denies red flag symptoms.  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 04/12/2022   Status post lumbar laminectomy 09/20/2021   Body mass index (BMI) 37.0-37.9, adult 03/14/2019   Lumbar stenosis without neurogenic claudication 03/14/2019   Hyperlipidemia 02/08/2019   Lumbar spondylosis 10/25/2018   Degeneration of lumbar intervertebral disc 10/01/2018   Surgical wound dehiscence 06/06/2018   Osteoarthritis of left hip 04/25/2018   Spinal stenosis of lumbar region 02/23/2018   Pain in left knee 01/10/2018   Trigger thumb of right hand 03/30/2017   Symptomatic mammary hypertrophy 11/28/2014   Dyspnea 03/26/2013   Asthma vs VCD  02/28/2013   Hypertensive disorder 06/07/2012   Pyogenic granuloma 02/15/2012     Current Outpatient Medications on File Prior to Visit  Medication Sig Dispense Refill   albuterol  (VENTOLIN  HFA) 108 (90 Base) MCG/ACT inhaler Inhale 2 puffs into the lungs every 4 (four) hours as needed (wheezing and SOB). 8 g 2   Calcium -Magnesium-Zinc (CAL-MAG-ZINC PO) Take 1 tablet by mouth 2 (two) times daily.     Cyanocobalamin  (B-12  PO) Take 1 capsule by mouth daily.     diclofenac  (VOLTAREN ) 50 MG EC tablet TAKE 1 TABLET BY MOUTH 3 TIMES  DAILY 300 tablet 2   ELDERBERRY PO Take 1 tablet by mouth daily.     fluticasone  (FLONASE ) 50 MCG/ACT nasal spray Place 1 spray into both nostrils every other day.     levocetirizine (XYZAL ) 5 MG tablet Take 1 tablet (5 mg total) by mouth every evening. 30 tablet 0   Multiple Vitamin (MULTIVITAMIN WITH MINERALS) TABS tablet Take 1 tablet by mouth daily.     pregabalin  (LYRICA ) 75 MG capsule TAKE 1 CAPSULE BY MOUTH TWICE  DAILY 60 capsule 2   Spacer/Aero-Holding Chambers DEVI 1 Units by Does not apply route in the morning, at noon, and at bedtime. 1 Units 0   Turmeric 500 MG CAPS Take 500 mg by mouth 2 (two) times daily.     No current facility-administered medications on file prior to visit.    Allergies  Allergen Reactions   Pineapple Shortness Of Breath, Swelling and Hives    Swelling of tongue    Chocolate Hives   Coconut (Cocos Nucifera) Hives and Rash   Strawberry Extract Hives and Rash   Wheat Hives    Social History   Socioeconomic History   Marital status: Widowed    Spouse name: Not on file   Number of children: 2   Years of education: Not on file   Highest education level: Associate degree: occupational, Scientist, product/process development, or vocational program  Occupational History   Not on  file  Tobacco Use   Smoking status: Never    Passive exposure: Never   Smokeless tobacco: Never  Vaping Use   Vaping status: Never Used  Substance and Sexual Activity   Alcohol  use: Yes    Alcohol /week: 1.0 standard drink of alcohol     Types: 1 Glasses of wine per week   Drug use: Never   Sexual activity: Not on file  Other Topics Concern   Not on file  Social History Narrative   Not on file   Social Drivers of Health   Financial Resource Strain: Low Risk  (10/13/2023)   Overall Financial Resource Strain (CARDIA)    Difficulty of Paying Living Expenses: Not hard at all  Food  Insecurity: Food Insecurity Present (10/13/2023)   Hunger Vital Sign    Worried About Running Out of Food in the Last Year: Sometimes true    Ran Out of Food in the Last Year: Often true  Transportation Needs: No Transportation Needs (10/13/2023)   PRAPARE - Administrator, Civil Service (Medical): No    Lack of Transportation (Non-Medical): No  Physical Activity: Insufficiently Active (10/13/2023)   Exercise Vital Sign    Days of Exercise per Week: 3 days    Minutes of Exercise per Session: 30 min  Stress: No Stress Concern Present (10/13/2023)   [employer] of Occupational Health - Occupational Stress Questionnaire    Feeling of Stress: Not at all  Social Connections: Moderately Isolated (10/13/2023)   Social Connection and Isolation Panel    Frequency of Communication with Friends and Family: More than three times a week    Frequency of Social Gatherings with Friends and Family: Once a week    Attends Religious Services: More than 4 times per year    Active Member of [employer] or Organizations: No    Attends Banker Meetings: Not on file    Marital Status: Widowed  Intimate Partner Violence: Not At Risk (04/13/2023)   Humiliation, Afraid, Rape, and Kick questionnaire    Fear of Current or Ex-Partner: No    Emotionally Abused: No    Physically Abused: No    Sexually Abused: No    Family History  Problem Relation Age of Onset   Hypertension Mother    Asthma Mother    Allergies Mother    Breast cancer Sister    Heart disease Sister    Hypertension Sister    Stroke Sister    Diabetes Sister    Colon cancer Neg Hx    Esophageal cancer Neg Hx    Stomach cancer Neg Hx    Pancreatic cancer Neg Hx     Past Surgical History:  Procedure Laterality Date   ANTERIOR HIP REVISION Left 06/06/2018   Procedure: LEFT HIP WOUND DEHISCENCE POSSIBLE HEAD AND LINER EXCHANGE;  Surgeon: Fidel Rogue, MD;  Location: [facility];  Service: Orthopedics;  Laterality: Left;    COLONOSCOPY  2015   DECOMPRESSIVE LUMBAR LAMINECTOMY LEVEL 2 N/A 09/22/2023   Procedure: DECOMPRESSIVE LUMBAR LAMINECTOMY LEVEL 2;  Surgeon: Georgina Ozell LABOR, MD;  Location: [facility];  Service: Orthopedics;  Laterality: N/A;  Lumbar 2 and Lumbar 3 segmental laminectomies with partial medial facetectomies   DIAGNOSTIC LAPAROSCOPY     tubal preg-took ovary and tube   LAPAROSCOPY FOR ECTOPIC PREGNANCY  1990s   LEFT SALPINGECTOMY AND RIGHT OOPHORECTOMY FOR ABNORMALITY   LUMBAR LAMINECTOMY N/A 09/20/2021   Procedure: L3-4 AND L4-5 CENTRAL LUMBAR LAMINECTOMIES;  Surgeon: Lucilla Lynwood BRAVO,  MD;  Location: [facility];  Service: Orthopedics;  Laterality: N/A;   MASS EXCISION  02/15/2012   Procedure: EXCISION MASS;  Surgeon: Donnice DELENA Robinsons, MD;  Location: [facility];  Service: Orthopedics;  Laterality: Right;  Excision of Right Small Volar Mass   MYELOGRAM     TONSILLECTOMY  age 36   TOTAL HIP ARTHROPLASTY Left 04/25/2018   Procedure: TOTAL HIP ARTHROPLASTY ANTERIOR APPROACH;  Surgeon: Fidel Rogue, MD;  Location: [facility];  Service: Orthopedics;  Laterality: Left;   TRIGGER FINGER RELEASE Right 2018   thumb   WISDOM TOOTH EXTRACTION  age 82    ROS: Review of Systems Negative except as stated above  PHYSICAL EXAM: BP 131/82   Pulse 84   Temp 97.6 F (36.4 C) (Oral)   Resp 16   Ht 5' 5 (1.651 m)   Wt 227 lb 6.4 oz (103.1 kg)   LMP 05/04/2012   SpO2 97%   BMI 37.84 kg/m   Wt Readings from Last 3 Encounters:  10/17/23 227 lb 6.4 oz (103.1 kg)  09/22/23 228 lb 12.8 oz (103.8 kg)  09/13/23 228 lb 12.8 oz (103.8 kg)   Physical Exam HENT:     Head: Normocephalic and atraumatic.     Nose: Nose normal.     Mouth/Throat:     Mouth: Mucous membranes are moist.     Pharynx: Oropharynx is clear.  Eyes:     Extraocular Movements: Extraocular movements intact.     Conjunctiva/sclera: Conjunctivae normal.     Pupils: Pupils are equal, round, and reactive to light.   Cardiovascular:     Rate and Rhythm: Normal rate and regular rhythm.     Pulses: Normal pulses.     Heart sounds: Normal heart sounds.  Pulmonary:     Effort: Pulmonary effort is normal.     Breath sounds: Normal breath sounds.  Musculoskeletal:        General: Normal range of motion.     Cervical back: Normal range of motion and neck supple.  Skin:    General: Skin is warm and dry.     Comments: Small rash/boil left lower back, no drainage.  Neurological:     General: No focal deficit present.     Mental Status: She is alert and oriented to person, place, and time.  Psychiatric:        Mood and Affect: Mood normal.        Behavior: Behavior normal.     ASSESSMENT AND PLAN: 1. Primary hypertension (Primary) - Continue Amlodipine  as prescribed.  - Counseled on blood pressure goal of less than 130/80, low-sodium, DASH diet, medication compliance, and 150 minutes of moderate intensity exercise per week as tolerated. Counseled on medication adherence and adverse effects. - Follow-up with primary provider in 3 months or sooner if needed.  - amLODipine  (NORVASC ) 5 MG tablet; Take 1 tablet (5 mg total) by mouth daily.  Dispense: 90 tablet; Refill: 0  2. Hyperlipidemia, unspecified hyperlipidemia type - Continue Atorvastatin  as prescribed. Counseled on medication adherence/adverse effects.  - Follow-up with primary provider in 3 months or sooner if needed. - atorvastatin  (LIPITOR) 20 MG tablet; Take 1 tablet (20 mg total) by mouth daily.  Dispense: 90 tablet; Refill: 0  3. Encounter for weight management 4. BMI 37.0-37.9, adult - Patient lost 1 pound since previous office visit.  - Phentermine  as prescribed. Counseled on medication adherence/adverse effects.  - Follow-up with primary provider in 4 weeks or sooner if  needed.  - phentermine  37.5 MG capsule; Take 1 capsule (37.5 mg total) by mouth every morning.  Dispense: 30 capsule; Refill: 0  5. Skin irritation - Mupirocin  as  prescribed. Counseled on medication adherence/adverse effects.  - Follow-up with primary provider as scheduled. - mupirocin  cream (BACTROBAN ) 2 %; Apply 1 Application topically 2 (two) times daily.  Dispense: 60 g; Refill: 1   Patient was given the opportunity to ask questions.  Patient verbalized understanding of the plan and was able to repeat key elements of the plan. Patient was given clear instructions to go to Emergency Department or return to medical center if symptoms don't improve, worsen, or new problems develop.The patient verbalized understanding.   Requested Prescriptions   Signed Prescriptions Disp Refills   amLODipine  (NORVASC ) 5 MG tablet 90 tablet 0    Sig: Take 1 tablet (5 mg total) by mouth daily.   atorvastatin  (LIPITOR) 20 MG tablet 90 tablet 0    Sig: Take 1 tablet (20 mg total) by mouth daily.   phentermine  37.5 MG capsule 30 capsule 0    Sig: Take 1 capsule (37.5 mg total) by mouth every morning.   mupirocin  cream (BACTROBAN ) 2 % 60 g 1    Sig: Apply 1 Application topically 2 (two) times daily.    Return in about 3 months (around 01/17/2024) for Follow-Up or next available chronic conditions.  Greig JINNY Drones, NP

## 2023-10-17 NOTE — Progress Notes (Signed)
 3 month follow-up

## 2023-11-02 ENCOUNTER — Ambulatory Visit (AMBULATORY_SURGERY_CENTER)

## 2023-11-02 VITALS — Ht 65.0 in | Wt 225.0 lb

## 2023-11-02 DIAGNOSIS — Z1211 Encounter for screening for malignant neoplasm of colon: Secondary | ICD-10-CM

## 2023-11-02 MED ORDER — NA SULFATE-K SULFATE-MG SULF 17.5-3.13-1.6 GM/177ML PO SOLN: 1.0000 | Freq: Once | ORAL | 0 refills | Status: AC

## 2023-11-02 NOTE — Progress Notes (Signed)
 Pre visit completed via phone call; Patient verified name, DOB, and address; No egg or soy allergy known to patient;  No issues known to pt with past sedation with any surgeries or procedures; Patient denies ever being told they had issues or difficulty with intubation;  No FH of Malignant Hyperthermia; Pt is not on diet pills; Pt is not on home 02;  Pt is not on blood thinners;  Pt denies issues with constipation;  No A fib or A flutter; Have any cardiac testing pending--NO Insurance verified during PV appt--- [facility] Medicare/Medicaid Pt can ambulate without assistance;  Pt denies use of chewing tobacco; Discussed diabetic/weight loss medication holds; Discussed NSAID holds; Checked BMI to be less than 50; Pt instructed to use Singlecare.com or GoodRx for a price reduction on prep;  Patient's chart reviewed by Norleen Schillings CNRA prior to previsit and patient appropriate for the [facility]; Pre visit completed and red dot placed by patient's name on their procedure day (on provider's schedule);   Instructions sent to MyChart per patient request;

## 2023-11-06 ENCOUNTER — Ambulatory Visit (INDEPENDENT_AMBULATORY_CARE_PROVIDER_SITE_OTHER): Admitting: Orthopedic Surgery

## 2023-11-06 DIAGNOSIS — R29898 Other symptoms and signs involving the musculoskeletal system: Secondary | ICD-10-CM

## 2023-11-06 MED ORDER — PREGABALIN 75 MG PO CAPS: 75.0000 mg | ORAL_CAPSULE | Freq: Two times a day (BID) | ORAL | 2 refills | Status: DC

## 2023-11-06 NOTE — Progress Notes (Signed)
 Orthopedic Surgery Post-operative Office Visit   Procedure: L2/3 laminectomy Date of Surgery: 09/22/2023 (~6 weeks post-op)   Assessment: Patient is a 58 y.o. who is doing well after surgery. Still has some left hip flexor weakness     Plan: -Operative plans complete -No spine specific restrictions -Okay to submerge wounds at this time -Provided her with a referral to PT to work on hip flexor strengthening -Pain management: tylenol  as needed -Return to office in 4 weeks, x-rays needed at next visit: AP/lateral/flex/ex lumbar   ___________________________________________________________________________     Subjective: Patient has not had any radiating leg pain since she was last seen in the office.  She is not having significant back pain.  She said she is walking about a mile per day with her cane.  She still notices hip flexor weakness on the left side.  No other weakness noted.  No worsening of her weakness since she was last seen.  Has not noticed any redness or drainage around her incision.  Objective:   General: no acute distress, appropriate affect Neurologic: alert, answering questions appropriately, following commands Respiratory: unlabored breathing on room air Skin: incision is well healed with no erythema, induration, active/proximal drainage   MSK (spine):   -Strength exam                                                   Left                  Right   EHL                              5/5                  5/5 TA                                 5/5                  5/5 GSC                             5/5                  5/5 Knee extension            5/5                  5/5 Hip flexion                    4-/5                 5/5   -Sensory exam                           Sensation intact to light touch in L3-S1 nerve distributions of bilateral lower extremities   Imaging: None obtained at today's visit     Patient name: Darlene Crosby Patient MRN:  [id number] Date of visit: 11/06/23

## 2023-11-07 ENCOUNTER — Ambulatory Visit (INDEPENDENT_AMBULATORY_CARE_PROVIDER_SITE_OTHER): Admitting: Orthopedic Surgery

## 2023-11-07 DIAGNOSIS — M25541 Pain in joints of right hand: Secondary | ICD-10-CM

## 2023-11-07 NOTE — Progress Notes (Signed)
 Darlene Crosby - 58 y.o. female MRN [id number]  Date of birth: 07/03/1965  Office Visit Note: Visit Date: 11/07/2023 PCP: Lorren Greig PARAS, NP Referred by: Georgina Ozell LABOR, MD  Subjective: No chief complaint on file.  HPI: Darlene Crosby is a pleasant 58 y.o. female who presents today for evaluation of ongoing right long finger MCP arthritis.  She has been referred to me by my partner Dr. Jerri for specific hand surgical evaluation.  She has been seen in outside facility for ongoing right long finger MCP arthritis and has undergone multiple injections x 4 to this region for symptom relief.  She states that the injections only lasting 4 weeks at a time at this juncture, she is interested in potential surgical intervention.  Pertinent ROS were reviewed with the patient and found to be negative unless otherwise specified above in HPI.   Visit Reason:right long finger MCP arthritis Duration of symptoms: Multiple months, worsening in nature.  Hand dominance: right Occupation: Disabled  Diabetic: No Smoking: No Heart/Lung History:none Blood Thinners: none  Prior Testing/EMG:xray on 10/11/23 Injections (Date):Previously being seen by Dr. Prentice Charter at [facility]. Has undergone 4 steroid injections for pain which helps for weeks at a time but pain always returns to same location.  Treatments: none Prior Surgery:none  Assessment & Plan: Visit Diagnoses:  1. Arthralgia of metacarpophalangeal joint, right     Plan: Extensive discussion was had with the patient today regarding her right long finger MCP arthritis.  I reviewed her radiographs which do show significant degenerative changes at this region with joint space narrowing and subchondral sclerosis.  I explained that given the diminishing returns of the recent injections, at this juncture she would be indicated for right long finger MCP silicone arthroplasty.  Risks and benefits of the procedure were discussed, risks including but  not limited to infection, bleeding, scarring, stiffness, nerve injury, tendon injury, vascular injury, implant complication, recurrence of symptoms and need for subsequent operation.  We also discussed the appropriate postoperative protocol and timeframe for return to activities and function.  Patient expressed understanding.    Will move forward surgical scheduling of right long finger MCP silicone arthroplasty at the next available date.       Follow-up: No follow-ups on file.   Meds & Orders: No orders of the defined types were placed in this encounter.  No orders of the defined types were placed in this encounter.    Procedures: No procedures performed      Clinical History: No specialty comments available.  She reports that she has never smoked. She has never been exposed to tobacco smoke. She has never used smokeless tobacco.  Recent Labs    01/11/23 0928 08/14/23 0950 10/11/23 1031  HGBA1C 6.1* 6.1*  --   LABURIC  --   --  4.7    Objective:   Vital Signs: LMP 05/04/2012   Physical Exam  Gen: Well-appearing, in no acute distress; non-toxic CV: Regular Rate. Well-perfused. Warm.  Resp: Breathing unlabored on room air; no wheezing. Psych: Fluid speech in conversation; appropriate affect; normal thought process  Ortho Exam Right hand: - Notable swelling over the right long finger MCP region with associated tenderness - Range of motion at the MCP of the right long finger is limited 0-65, pain at extremes of motion, pain with collateral stress testing at the MCP as well - Sensation intact distally, hand remains warm well-perfused  Imaging: No results found. Previous imaging of the right hand  demonstrates significant degenerative changes of the long finger MCP joint  Past Medical/Family/Surgical/Social History: Medications & Allergies reviewed per EMR, new medications updated. Patient Active Problem List   Diagnosis Date Noted   Radiculopathy, lumbar region  09/22/2023   Cervical high 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 04/12/2022   Status post lumbar laminectomy 09/20/2021   Body mass index (BMI) 37.0-37.9, adult 03/14/2019   Lumbar stenosis without neurogenic claudication 03/14/2019   Hyperlipidemia 02/08/2019   Lumbar spondylosis 10/25/2018   Degeneration of lumbar intervertebral disc 10/01/2018   Surgical wound dehiscence 06/06/2018   Osteoarthritis of left hip 04/25/2018   Spinal stenosis of lumbar region 02/23/2018   Pain in left knee 01/10/2018   Trigger thumb of right hand 03/30/2017   Symptomatic mammary hypertrophy 11/28/2014   Dyspnea 03/26/2013   Asthma vs VCD  02/28/2013   Hypertensive disorder 06/07/2012   Pyogenic granuloma 02/15/2012   Past Medical History:  Diagnosis Date   Asthma    followed by pcp   Bronchitis    chronic   Hypertension    OA (osteoarthritis)    left knee, right shoulder, left hip   Pneumonia    a couple years ago per pt on 09/15/21   Seasonal allergies    Wears glasses    Family History  Problem Relation Age of Onset   Colon polyps Mother 33   Hypertension Mother    Asthma Mother    Allergies Mother    Breast cancer Sister    Heart disease Sister    Hypertension Sister    Stroke Sister    Diabetes Sister    Colon cancer Neg Hx    Esophageal cancer Neg Hx    Stomach cancer Neg Hx    Pancreatic cancer Neg Hx    Rectal cancer Neg Hx    Past Surgical History:  Procedure Laterality Date   ANTERIOR HIP REVISION Left 06/06/2018   Procedure: LEFT HIP WOUND DEHISCENCE POSSIBLE HEAD AND LINER EXCHANGE;  Surgeon: Fidel Rogue, MD;  Location: [facility];  Service: Orthopedics;  Laterality: Left;   COLONOSCOPY  2015   DECOMPRESSIVE LUMBAR LAMINECTOMY LEVEL 2 N/A 09/22/2023   Procedure: DECOMPRESSIVE LUMBAR LAMINECTOMY LEVEL 2;  Surgeon: Georgina Ozell LABOR, MD;  Location: [facility];  Service: Orthopedics;  Laterality: N/A;  Lumbar 2 and Lumbar 3 segmental laminectomies with partial  medial facetectomies   DIAGNOSTIC LAPAROSCOPY     tubal preg-took ovary and tube   LAPAROSCOPY FOR ECTOPIC PREGNANCY  1990s   LEFT SALPINGECTOMY AND RIGHT OOPHORECTOMY FOR ABNORMALITY   LUMBAR LAMINECTOMY N/A 09/20/2021   Procedure: L3-4 AND L4-5 CENTRAL LUMBAR LAMINECTOMIES;  Surgeon: Lucilla Lynwood BRAVO, MD;  Location: [facility];  Service: Orthopedics;  Laterality: N/A;   MASS EXCISION  02/15/2012   Procedure: EXCISION MASS;  Surgeon: Donnice LABOR Robinsons, MD;  Location: [facility];  Service: Orthopedics;  Laterality: Right;  Excision of Right Small Volar Mass   MYELOGRAM     TONSILLECTOMY  age 40   TOTAL HIP ARTHROPLASTY Left 04/25/2018   Procedure: TOTAL HIP ARTHROPLASTY ANTERIOR APPROACH;  Surgeon: Fidel Rogue, MD;  Location: [facility];  Service: Orthopedics;  Laterality: Left;   TRIGGER FINGER RELEASE Right 2018   thumb   WISDOM TOOTH EXTRACTION  age 76   Social History   Occupational History   Not on file  Tobacco Use   Smoking status: Never    Passive exposure: Never   Smokeless tobacco: Never  Vaping Use  Vaping status: Never Used  Substance and Sexual Activity   Alcohol  use: Yes    Alcohol /week: 1.0 standard drink of alcohol     Types: 1 Glasses of wine per week   Drug use: Never   Sexual activity: Not on file    Treniya Lobb Estela) Arlinda, M.D. [facility], Hand Surgery

## 2023-11-08 ENCOUNTER — Other Ambulatory Visit: Payer: Self-pay | Admitting: Family

## 2023-11-08 DIAGNOSIS — J3089 Other allergic rhinitis: Secondary | ICD-10-CM

## 2023-11-08 NOTE — Telephone Encounter (Signed)
 Complete

## 2023-11-16 ENCOUNTER — Encounter: Payer: Self-pay | Admitting: Gastroenterology

## 2023-11-16 ENCOUNTER — Ambulatory Visit (AMBULATORY_SURGERY_CENTER): Admitting: Gastroenterology

## 2023-11-16 VITALS — BP 131/89 | HR 75 | Temp 97.9°F | Resp 16 | Ht 65.0 in | Wt 225.0 lb

## 2023-11-16 DIAGNOSIS — K5140 Inflammatory polyps of colon without complications: Secondary | ICD-10-CM

## 2023-11-16 DIAGNOSIS — K5730 Diverticulosis of large intestine without perforation or abscess without bleeding: Secondary | ICD-10-CM | POA: Diagnosis not present

## 2023-11-16 DIAGNOSIS — Z1211 Encounter for screening for malignant neoplasm of colon: Secondary | ICD-10-CM | POA: Diagnosis not present

## 2023-11-16 DIAGNOSIS — K6289 Other specified diseases of anus and rectum: Secondary | ICD-10-CM

## 2023-11-16 DIAGNOSIS — D124 Benign neoplasm of descending colon: Secondary | ICD-10-CM

## 2023-11-16 MED ORDER — SODIUM CHLORIDE 0.9 % IV SOLN: 500.0000 mL | Freq: Once | INTRAVENOUS | Status: DC

## 2023-11-16 NOTE — Patient Instructions (Signed)
 Handouts given: Polyps Resume previous diet. Continue present medications. Await pathology results. Repeat colonoscopy is recommended for surveillance. They colonoscopy date will be determined after pathology results from today's exam become available for review.  YOU HAD AN ENDOSCOPIC PROCEDURE TODAY AT THE [facility]:   Refer to the procedure report that was given to you for any specific questions about what was found during the examination.  If the procedure report does not answer your questions, please call your gastroenterologist to clarify.  If you requested that your care partner not be given the details of your procedure findings, then the procedure report has been included in a sealed envelope for you to review at your convenience later.  YOU SHOULD EXPECT: Some feelings of bloating in the abdomen. Passage of more gas than usual.  Walking can help get rid of the air that was put into your GI tract during the procedure and reduce the bloating. If you had a lower endoscopy (such as a colonoscopy or flexible sigmoidoscopy) you may notice spotting of blood in your stool or on the toilet paper. If you underwent a bowel prep for your procedure, you may not have a normal bowel movement for a few days.  Please Note:  You might notice some irritation and congestion in your nose or some drainage.  This is from the oxygen used during your procedure.  There is no need for concern and it should clear up in a day or so.  SYMPTOMS TO REPORT IMMEDIATELY:  Following lower endoscopy (colonoscopy or flexible sigmoidoscopy):  Excessive amounts of blood in the stool  Significant tenderness or worsening of abdominal pains  Swelling of the abdomen that is new, acute  Fever of 100F or higher  For urgent or emergent issues, a gastroenterologist can be reached at any hour by calling [phone]. Do not use MyChart messaging for urgent concerns.    DIET:  We do recommend a small meal at  first, but then you may proceed to your regular diet.  Drink plenty of fluids but you should avoid alcoholic beverages for 24 hours.  ACTIVITY:  You should plan to take it easy for the rest of today and you should NOT DRIVE or use heavy machinery until tomorrow (because of the sedation medicines used during the test).    FOLLOW UP: Our staff will call the number listed on your records the next business day following your procedure.  We will call around 7:15- 8:00 am to check on you and address any questions or concerns that you may have regarding the information given to you following your procedure. If we do not reach you, we will leave a message.     If any biopsies were taken you will be contacted by phone or by letter within the next 1-3 weeks.  Please call us  at [phone] if you have not heard about the biopsies in 3 weeks.    SIGNATURES/CONFIDENTIALITY: You and/or your care partner have signed paperwork which will be entered into your electronic medical record.  These signatures attest to the fact that that the information above on your After Visit Summary has been reviewed and is understood.  Full responsibility of the confidentiality of this discharge information lies with you and/or your care-partner.

## 2023-11-16 NOTE — Progress Notes (Signed)
Vitals-JD  Pt's states no medical or surgical changes since previsit or office visit. 

## 2023-11-16 NOTE — Progress Notes (Signed)
 Report given to PACU, vss

## 2023-11-16 NOTE — Progress Notes (Signed)
 Called to room to assist during endoscopic procedure.  Patient ID and intended procedure confirmed with present staff. Received instructions for my participation in the procedure from the performing physician.

## 2023-11-16 NOTE — Op Note (Signed)
 [facility] Patient Name: Darlene Crosby Procedure Date: 11/16/2023 10:35 AM MRN: [id number] Endoscopist: Victory L. Legrand , MD, [id number] Age: 58 Referring MD:  Date of Birth: 1965/12/23 Gender: Female Account #: [id number] Procedure:                Colonoscopy Indications:              Screening for colorectal malignant neoplasm                           No polyps last colonoscopy by Dr. Luis in 2015 Medicines:                Monitored Anesthesia Care Procedure:                Pre-Anesthesia Assessment:                           - Prior to the procedure, a History and Physical                            was performed, and patient medications and                            allergies were reviewed. The patient's tolerance of                            previous anesthesia was also reviewed. The risks                            and benefits of the procedure and the sedation                            options and risks were discussed with the patient.                            All questions were answered, and informed consent                            was obtained. Prior Anticoagulants: The patient has                            taken no anticoagulant or antiplatelet agents. ASA                            Grade Assessment: II - A patient with mild systemic                            disease. After reviewing the risks and benefits,                            the patient was deemed in satisfactory condition to                            undergo the procedure.  After obtaining informed consent, the colonoscope                            was passed under direct vision. Throughout the                            procedure, the patient's blood pressure, pulse, and                            oxygen saturations were monitored continuously. The                            Olympus Scope SN [id number] was introduced through the                            anus and  advanced to the the cecum, identified by                            appendiceal orifice and ileocecal valve. The                            colonoscopy was performed without difficulty. The                            patient tolerated the procedure well. The quality                            of the bowel preparation was good after lavage. The                            ileocecal valve, appendiceal orifice, and rectum                            were photographed. Scope In: 10:48:17 AM Scope Out: 11:07:01 AM Scope Withdrawal Time: 0 hours 13 minutes 45 seconds  Total Procedure Duration: 0 hours 18 minutes 44 seconds  Findings:                 The digital rectal exam findings include decreased                            sphincter tone.                           Repeat examination of right colon under NBI                            performed.                           Multiple diverticula were found in the left colon                            and right colon. (L > R)  A 4 mm polyp was found in the descending colon. The                            polyp was pedunculated. The polyp was removed with                            a hot snare. Resection and retrieval were complete. Complications:            No immediate complications. Estimated Blood Loss:     Estimated blood loss was minimal. Impression:               - Decreased sphincter tone found on digital rectal                            exam.                           - Diverticulosis in the left colon and in the right                            colon.                           - One 4 mm polyp in the descending colon, removed                            with a hot snare. Resected and retrieved. Recommendation:           - Patient has a contact number available for                            emergencies. The signs and symptoms of potential                            delayed complications were discussed with the                             patient. Return to normal activities tomorrow.                            Written discharge instructions were provided to the                            patient.                           - Resume previous diet.                           - Continue present medications.                           - Await pathology results.                           - Repeat colonoscopy is recommended for  surveillance. The colonoscopy date will be                            determined after pathology results from today's                            exam become available for review. Alaynna Kerwood L. Legrand, MD 11/16/2023 11:13:50 AM This report has been signed electronically.

## 2023-11-16 NOTE — Progress Notes (Signed)
 History and Physical:  This patient presents for endoscopic testing for: Encounter Diagnosis  Name Primary?   Colon cancer screening Yes    58 year old woman here today for screening colonoscopy.  No polyps were discovered on her last colonoscopy with Dr. Luis in August 2015. Patient denies chronic abdominal pain, rectal bleeding, constipation or diarrhea.   Patient is otherwise without complaints or active issues today.   Past Medical History: Past Medical History:  Diagnosis Date   Asthma    followed by pcp   Bronchitis    chronic   Hypertension    OA (osteoarthritis)    left knee, right shoulder, left hip   Pneumonia    a couple years ago per pt on 09/15/21   Seasonal allergies    Wears glasses      Past Surgical History: Past Surgical History:  Procedure Laterality Date   ANTERIOR HIP REVISION Left 06/06/2018   Procedure: LEFT HIP WOUND DEHISCENCE POSSIBLE HEAD AND LINER EXCHANGE;  Surgeon: Fidel Rogue, MD;  Location: [facility];  Service: Orthopedics;  Laterality: Left;   COLONOSCOPY  2015   DECOMPRESSIVE LUMBAR LAMINECTOMY LEVEL 2 N/A 09/22/2023   Procedure: DECOMPRESSIVE LUMBAR LAMINECTOMY LEVEL 2;  Surgeon: Georgina Ozell LABOR, MD;  Location: [facility];  Service: Orthopedics;  Laterality: N/A;  Lumbar 2 and Lumbar 3 segmental laminectomies with partial medial facetectomies   DIAGNOSTIC LAPAROSCOPY     tubal preg-took ovary and tube   LAPAROSCOPY FOR ECTOPIC PREGNANCY  1990s   LEFT SALPINGECTOMY AND RIGHT OOPHORECTOMY FOR ABNORMALITY   LUMBAR LAMINECTOMY N/A 09/20/2021   Procedure: L3-4 AND L4-5 CENTRAL LUMBAR LAMINECTOMIES;  Surgeon: Lucilla Lynwood BRAVO, MD;  Location: [facility];  Service: Orthopedics;  Laterality: N/A;   MASS EXCISION  02/15/2012   Procedure: EXCISION MASS;  Surgeon: Donnice LABOR Robinsons, MD;  Location: [facility];  Service: Orthopedics;  Laterality: Right;  Excision of Right Small Volar Mass   MYELOGRAM     TONSILLECTOMY  age 27   TOTAL HIP  ARTHROPLASTY Left 04/25/2018   Procedure: TOTAL HIP ARTHROPLASTY ANTERIOR APPROACH;  Surgeon: Fidel Rogue, MD;  Location: [facility];  Service: Orthopedics;  Laterality: Left;   TRIGGER FINGER RELEASE Right 2018   thumb   WISDOM TOOTH EXTRACTION  age 49    Allergies: Allergies  Allergen Reactions   Pineapple Shortness Of Breath, Swelling and Hives    Swelling of tongue    Chocolate Hives   Coconut (Cocos Nucifera) Hives and Rash   Strawberry Extract Hives and Rash   Wheat Hives   Flavoring Agent Rash    Outpatient Meds: Current Outpatient Medications  Medication Sig Dispense Refill   albuterol  (VENTOLIN  HFA) 108 (90 Base) MCG/ACT inhaler Inhale 2 puffs into the lungs every 4 (four) hours as needed (wheezing and SOB). 8 g 2   amLODipine  (NORVASC ) 5 MG tablet Take 1 tablet (5 mg total) by mouth daily. 90 tablet 0   Calcium -Magnesium-Zinc (CAL-MAG-ZINC PO) Take 1 tablet by mouth 2 (two) times daily. (Patient taking differently: Take 1 tablet by mouth daily.)     Cyanocobalamin  (B-12 PO) Take 1 capsule by mouth daily.     diclofenac  (VOLTAREN ) 50 MG EC tablet TAKE 1 TABLET BY MOUTH 3 TIMES  DAILY 300 tablet 2   ELDERBERRY PO Take 1 tablet by mouth daily.     fluticasone  (FLONASE ) 50 MCG/ACT nasal spray Place 1 spray into both nostrils every other day.     levocetirizine (XYZAL ) 5 MG tablet TAKE  1 TABLET BY MOUTH ONCE DAILY IN THE EVENING 90 tablet 0   Multiple Vitamin (MULTIVITAMIN WITH MINERALS) TABS tablet Take 1 tablet by mouth daily.     mupirocin  cream (BACTROBAN ) 2 % Apply 1 Application topically 2 (two) times daily. 60 g 1   pregabalin  (LYRICA ) 75 MG capsule Take 1 capsule (75 mg total) by mouth 2 (two) times daily. 60 capsule 2   Spacer/Aero-Holding Chambers DEVI 1 Units by Does not apply route in the morning, at noon, and at bedtime. 1 Units 0   atorvastatin  (LIPITOR) 20 MG tablet Take 1 tablet (20 mg total) by mouth daily. 90 tablet 0   phentermine  37.5 MG capsule Take 1  capsule (37.5 mg total) by mouth every morning. 30 capsule 0   Turmeric 500 MG CAPS Take 500 mg by mouth 2 (two) times daily. (Patient taking differently: Take 500 mg by mouth daily.)     Current Facility-Administered Medications  Medication Dose Route Frequency Provider Last Rate Last Admin   0.9 %  sodium chloride  infusion  500 mL Intravenous Once Danis, Dan Scearce L III, MD          ___________________________________________________________________ Objective   Exam:  BP (!) 169/100   Pulse 87   Temp 97.9 F (36.6 C) (Temporal)   Resp 16   Ht 5' 5 (1.651 m)   Wt 225 lb (102.1 kg)   LMP 05/04/2012   SpO2 95%   BMI 37.44 kg/m   CV: regular , S1/S2 Resp: clear to auscultation bilaterally, normal RR and effort noted GI: soft, no tenderness, with active bowel sounds.   Assessment: Encounter Diagnosis  Name Primary?   Colon cancer screening Yes     Plan: Colonoscopy   The benefits and risks of the planned procedure(s) were described in detail with the patient or (when appropriate) their health care proxy.  Risks were outlined as including, but not limited to, bleeding, infection, perforation, adverse medication reaction leading to cardiac or pulmonary decompensation, pancreatitis (if ERCP).  The limitation of incomplete mucosal visualization was also discussed.  No guarantees or warranties were given.  The patient is appropriate for an endoscopic procedure in the ambulatory setting.   - Victory Brand, MD

## 2023-11-17 ENCOUNTER — Telehealth: Payer: Self-pay

## 2023-11-17 NOTE — Telephone Encounter (Signed)
Attempted to reach patient for post-procedure f/u call. No answer. Left message for her to please not hesitate to call if she has any questions/concerns regarding her care. 

## 2023-11-20 ENCOUNTER — Ambulatory Visit: Payer: Self-pay | Admitting: Gastroenterology

## 2023-11-20 ENCOUNTER — Other Ambulatory Visit: Payer: Self-pay

## 2023-11-20 ENCOUNTER — Ambulatory Visit: Attending: Orthopedic Surgery

## 2023-11-20 DIAGNOSIS — M25541 Pain in joints of right hand: Secondary | ICD-10-CM

## 2023-11-20 DIAGNOSIS — M25552 Pain in left hip: Secondary | ICD-10-CM | POA: Diagnosis present

## 2023-11-20 DIAGNOSIS — R29898 Other symptoms and signs involving the musculoskeletal system: Secondary | ICD-10-CM | POA: Insufficient documentation

## 2023-11-20 DIAGNOSIS — M6281 Muscle weakness (generalized): Secondary | ICD-10-CM | POA: Diagnosis present

## 2023-11-20 DIAGNOSIS — R2689 Other abnormalities of gait and mobility: Secondary | ICD-10-CM | POA: Diagnosis present

## 2023-11-20 LAB — SURGICAL PATHOLOGY

## 2023-11-20 NOTE — Therapy (Signed)
 OUTPATIENT PHYSICAL THERAPY LOWER EXTREMITY EVALUATION   Patient Name: Darlene Crosby MRN: [id number] DOB:05/18/1965, 58 y.o., female Today's Date: 11/20/2023  END OF SESSION:  PT End of Session - 11/20/23 1355     Visit Number 1    Number of Visits 17    Date for Recertification  01/15/24    Authorization Type [employer] Dual Complete    PT Start Time 1313    PT Stop Time 1348    PT Time Calculation (min) 35 min    Activity Tolerance Patient tolerated treatment well    Behavior During Therapy WFL for tasks assessed/performed          Past Medical History:  Diagnosis Date   Asthma    followed by pcp   Bronchitis    chronic   Hypertension    OA (osteoarthritis)    left knee, right shoulder, left hip   Pneumonia    a couple years ago per pt on 09/15/21   Seasonal allergies    Wears glasses    Past Surgical History:  Procedure Laterality Date   ANTERIOR HIP REVISION Left 06/06/2018   Procedure: LEFT HIP WOUND DEHISCENCE POSSIBLE HEAD AND LINER EXCHANGE;  Surgeon: Fidel Rogue, MD;  Location: [facility];  Service: Orthopedics;  Laterality: Left;   COLONOSCOPY  2015   DECOMPRESSIVE LUMBAR LAMINECTOMY LEVEL 2 N/A 09/22/2023   Procedure: DECOMPRESSIVE LUMBAR LAMINECTOMY LEVEL 2;  Surgeon: Georgina Ozell LABOR, MD;  Location: [facility];  Service: Orthopedics;  Laterality: N/A;  Lumbar 2 and Lumbar 3 segmental laminectomies with partial medial facetectomies   DIAGNOSTIC LAPAROSCOPY     tubal preg-took ovary and tube   LAPAROSCOPY FOR ECTOPIC PREGNANCY  1990s   LEFT SALPINGECTOMY AND RIGHT OOPHORECTOMY FOR ABNORMALITY   LUMBAR LAMINECTOMY N/A 09/20/2021   Procedure: L3-4 AND L4-5 CENTRAL LUMBAR LAMINECTOMIES;  Surgeon: Lucilla Lynwood BRAVO, MD;  Location: [facility];  Service: Orthopedics;  Laterality: N/A;   MASS EXCISION  02/15/2012   Procedure: EXCISION MASS;  Surgeon: Donnice LABOR Robinsons, MD;  Location: [facility];  Service: Orthopedics;  Laterality: Right;  Excision of Right  Small Volar Mass   MYELOGRAM     TONSILLECTOMY  age 12   TOTAL HIP ARTHROPLASTY Left 04/25/2018   Procedure: TOTAL HIP ARTHROPLASTY ANTERIOR APPROACH;  Surgeon: Fidel Rogue, MD;  Location: [facility];  Service: Orthopedics;  Laterality: Left;   TRIGGER FINGER RELEASE Right 2018   thumb   WISDOM TOOTH EXTRACTION  age 21   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 04/12/2022   Status post lumbar laminectomy 09/20/2021   Body mass index (BMI) 37.0-37.9, adult 03/14/2019   Lumbar stenosis without neurogenic claudication 03/14/2019   Hyperlipidemia 02/08/2019   Lumbar spondylosis 10/25/2018   Degeneration of lumbar intervertebral disc 10/01/2018   Surgical wound dehiscence 06/06/2018   Osteoarthritis of left hip 04/25/2018   Spinal stenosis of lumbar region 02/23/2018   Pain in left knee 01/10/2018   Trigger thumb of right hand 03/30/2017   Symptomatic mammary hypertrophy 11/28/2014   Dyspnea 03/26/2013   Asthma vs VCD  02/28/2013   Hypertensive disorder 06/07/2012   Pyogenic granuloma 02/15/2012    PCP: Lorren Greig PARAS, NP  REFERRING PROVIDER: Georgina Ozell LABOR, MD  REFERRING DIAG: R29.898 (ICD-10-CM) - Weakness of left hip   THERAPY DIAG:  Pain in left hip  Muscle weakness (generalized)  Other abnormalities of gait and mobility  Rationale for Evaluation and Treatment: Rehabilitation  ONSET DATE: 09/22/2023  SUBJECTIVE:   SUBJECTIVE STATEMENT: Pt presents to PT s/p decompressive lumbar laminectomy performed by Dr. Georgina on 09/22/23. Pt notes that pain is occasionally in L anterior thigh and is especially present when fatigued. Denies sensation change in L LE. Symptoms of pain have resolved really well post surgery. She is frustrated by her continued inability to flex L hip in seated position.    PERTINENT HISTORY: L THA, Lumbar decompression, HTN  PAIN:   Are you having pain?  Yes: NPRS scale: 0/10 Worst: 8/10 Pain location: L anterior thigh Pain description: sore Aggravating factors: squats, stairs Relieving factors: heat  PRECAUTIONS: None  RED FLAGS: None   WEIGHT BEARING RESTRICTIONS: No  FALLS:  Has patient fallen in last 6 months? No  LIVING ENVIRONMENT: Lives with: lives with their family Lives in: House/apartment Stairs: 1 STE Has following equipment at home: Single point cane  PLOF: Independent  PATIENT GOALS: improve L LE strength, get back to being able to do line dancing without limitation   NEXT MD VISIT: 12/18/2023  OBJECTIVE:  Note: Objective measures were completed at Evaluation unless otherwise noted.  DIAGNOSTIC FINDINGS: N/A  PATIENT SURVEYS:  LEFS  Extreme difficulty/unable (0), Quite a bit of difficulty (1), Moderate difficulty (2), Little difficulty (3), No difficulty (4) Survey date:  11/20/2023  Any of your usual work, housework or school activities 2  2. Usual hobbies, recreational or sporting activities 3  3. Getting into/out of the bath 3  4. Walking between rooms 4  5. Putting on socks/shoes 3  6. Squatting  1  7. Lifting an object, like a bag of groceries from the floor 1  8. Performing light activities around your home 2  9. Performing heavy activities around your home 3  10. Getting into/out of a car 1  11. Walking 2 blocks 4  12. Walking 1 mile 4  13. Going up/down 10 stairs (1 flight) 3  14. Standing for 1 hour 3  15.  sitting for 1 hour 4  16. Running on even ground 0  17. Running on uneven ground 0  18. Making sharp turns while running fast 0  19. Hopping  1  20. Rolling over in bed 1  Score total:  42/80     COGNITION: Overall cognitive status: Within functional limits for tasks assessed     SENSATION: Light touch: Impaired   POSTURE: increased lumbar lordosis and flexed trunk   PALPATION: Slight TTP to proximal L thigh  LOWER EXTREMITY ROM:  Active ROM  Right eval Left eval  Hip flexion 110 45  Hip extension    Hip abduction    Hip adduction    Hip internal rotation    Hip external rotation    Knee flexion    Knee extension    Ankle dorsiflexion    Ankle plantarflexion    Ankle inversion    Ankle eversion     (Blank rows = not tested)  LOWER EXTREMITY MMT:  MMT Right eval Left eval  Hip flexion 4 2+  Hip extension    Hip abduction 4 4  Hip adduction    Hip internal rotation    Hip external rotation    Knee flexion 5 5  Knee extension 5 5  Ankle dorsiflexion    Ankle plantarflexion    Ankle inversion    Ankle eversion     (Blank rows =  not tested)  LOWER EXTREMITY SPECIAL TESTS:  DNT  FUNCTIONAL TESTS:  30 Second Sit to Stand: 11 reps  GAIT: Distance walked: 28ft Assistive device utilized: None Level of assistance: Complete Independence Comments: antalgic gait L LE   TREATMENT: [facility] Adult PT Treatment:                                                DATE: 11/20/2023 Therapeutic Exercise: Supine QS x 5 - 5 hold Supine SLR x 5 L Supine heel slide x 5 L S/L hip abd x 5 L STS x 10 - no UE  PATIENT EDUCATION:  Education details: eval findings, LEFS, HEP, POC Person educated: Patient Education method: Explanation, Demonstration, and Handouts Education comprehension: verbalized understanding and returned demonstration  HOME EXERCISE PROGRAM: Access Code: [email or web address] URL: [email or web address] Date: 11/20/2023 Prepared by: Alm Kingdom  Exercises - Supine Quad Set  - 1 x daily - 7 x weekly - 2 sets - 10 reps - 5 sec hold - Active Straight Leg Raise with Quad Set (Mirrored)  - 1 x daily - 7 x weekly - 3 sets - 10 reps - Supine Heel Slide (Mirrored)  - 1 x daily - 7 x weekly - 3 sets - 10 reps - 3 sec hold - Sidelying Hip Abduction  - 1 x daily - 7 x weekly - 2-3 sets - 10 reps - Sit to Stand Without Arm Support  - 1 x daily - 7 x weekly - 2-3 sets - 10 reps  ASSESSMENT:  CLINICAL  IMPRESSION: Patient is a 59 y.o. F who was seen today for physical therapy evaluation and treatment for continued L LE weakness s/p lumbar laminectomy performed on 09/22/23. Physical findings are consistent with surgery and recovery timeline as pt demonstrates decrease in L LE strength and general decrease in functional mobility. LEFS scores shows she is operating below PLOF for home ADLs and higher level mobility. Pt would benefit from skilled PT services working on improving LE strength and mobility post operatively in order to improve function and decrease L thigh pain.   OBJECTIVE IMPAIRMENTS: Abnormal gait, decreased activity tolerance, decreased balance, decreased endurance, decreased mobility, difficulty walking, decreased ROM, decreased strength, and pain   ACTIVITY LIMITATIONS: standing, squatting, stairs, transfers, and locomotion level  PARTICIPATION LIMITATIONS: meal prep, cleaning, driving, shopping, community activity, and yard work  PERSONAL FACTORS: Time since onset of injury/illness/exacerbation and 3+ comorbidities: L THA, Lumbar decompression, HTN are also affecting patient's functional outcome.   REHAB POTENTIAL: Excellent  CLINICAL DECISION MAKING: Stable/uncomplicated  EVALUATION COMPLEXITY: Low   GOALS: Goals reviewed with patient? No  SHORT TERM GOALS: Target date: 12/11/2023   Pt will be compliant and knowledgeable with initial HEP for improved comfort and carryover Baseline: initial HEP given  Goal status: INITIAL  2.  Pt will self report L LE pain no greater than 5/10 for improved comfort and functional ability Baseline: 8/10 at worst Goal status: INITIAL   LONG TERM GOALS: Target date: 01/15/2024   Pt will improve LEFS to no less than 52/80 as proxy for functional improvement with home ADLs and higher level community activity Baseline: 42/80 Goal status: INITIAL   2.  Pt will self report L LE pain no greater than 1-2/10 for improved comfort and  functional ability Baseline: 8/10 at worst Goal status: INITIAL   3.  Pt  will increase 30 Second Sit to Stand rep count to no less than 15 reps for improved balance, strength, and functional mobility Baseline: 11 reps  Goal status: INITIAL   4.  Pt will improve L hip flexion MMT to no less than 4/5 for improved functional mobility and decreased pain Baseline: 2+/5 Goal status: INITIAL  5.  Pt will improve L active hip flexion in supine to no less than 90 degrees for improved functional mobility Baseline: 45 degrees Goal status: INITIAL   PLAN:  PT FREQUENCY: 2x/week  PT DURATION: 8 weeks  PLANNED INTERVENTIONS: 97164- PT Re-evaluation, 97110-Therapeutic exercises, 97530- Therapeutic activity, [id number]- Neuromuscular re-education, 97535- Self Care, [id number]- Manual therapy, [id number]- Gait training, [phone]- Electrical stimulation (unattended), [id number]- Electrical stimulation (manual), 20560 (1-2 muscles), 20561 (3+ muscles)- Dry Needling, Patient/Family education, Cryotherapy, and Moist heat  PLAN FOR NEXT SESSION: assess HEP response, LE strengthening with emphasis on L hip flexion   Alm JAYSON Kingdom, PT 11/20/2023, 1:56 PM

## 2023-11-23 ENCOUNTER — Ambulatory Visit (INDEPENDENT_AMBULATORY_CARE_PROVIDER_SITE_OTHER): Admitting: Family

## 2023-11-23 ENCOUNTER — Encounter: Payer: Self-pay | Admitting: Family

## 2023-11-23 VITALS — BP 132/83 | HR 77 | Temp 97.4°F | Resp 16 | Ht 65.0 in | Wt 229.8 lb

## 2023-11-23 DIAGNOSIS — Z6838 Body mass index (BMI) 38.0-38.9, adult: Secondary | ICD-10-CM

## 2023-11-23 DIAGNOSIS — Z7689 Persons encountering health services in other specified circumstances: Secondary | ICD-10-CM

## 2023-11-23 DIAGNOSIS — Z23 Encounter for immunization: Secondary | ICD-10-CM

## 2023-11-23 DIAGNOSIS — Z760 Encounter for issue of repeat prescription: Secondary | ICD-10-CM

## 2023-11-23 MED ORDER — PHENTERMINE HCL 37.5 MG PO CAPS: 37.5000 mg | ORAL_CAPSULE | ORAL | 0 refills | Status: DC

## 2023-11-23 NOTE — Progress Notes (Signed)
 One month follow up

## 2023-11-23 NOTE — Progress Notes (Signed)
 Patient ID: Darlene Crosby, female    DOB: 1965/03/14  MRN: [id number]  CC: Weight Check  Subjective: Darlene Crosby is a 58 y.o. female who presents for weight check.   Her concerns today include:  - Doing well on Phentermine , no issues/concerns. States she is scheduled for hand surgery on next week. States her surgeon has already instructed her to quit taking Phentermine  3 days before surgery.   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 04/12/2022   Status post lumbar laminectomy 09/20/2021   Body mass index (BMI) 37.0-37.9, adult 03/14/2019   Lumbar stenosis without neurogenic claudication 03/14/2019   Hyperlipidemia 02/08/2019   Lumbar spondylosis 10/25/2018   Degeneration of lumbar intervertebral disc 10/01/2018   Surgical wound dehiscence 06/06/2018   Osteoarthritis of left hip 04/25/2018   Spinal stenosis of lumbar region 02/23/2018   Pain in left knee 01/10/2018   Trigger thumb of right hand 03/30/2017   Symptomatic mammary hypertrophy 11/28/2014   Dyspnea 03/26/2013   Asthma vs VCD  02/28/2013   Hypertensive disorder 06/07/2012   Pyogenic granuloma 02/15/2012     Current Outpatient Medications on File Prior to Visit  Medication Sig Dispense Refill   albuterol  (VENTOLIN  HFA) 108 (90 Base) MCG/ACT inhaler Inhale 2 puffs into the lungs every 4 (four) hours as needed (wheezing and SOB). 8 g 2   amLODipine  (NORVASC ) 5 MG tablet Take 1 tablet (5 mg total) by mouth daily. 90 tablet 0   atorvastatin  (LIPITOR) 20 MG tablet Take 1 tablet (20 mg total) by mouth daily. 90 tablet 0   Cyanocobalamin  (B-12 PO) Take 1 capsule by mouth daily.     diclofenac  (VOLTAREN ) 50 MG EC tablet TAKE 1 TABLET BY MOUTH 3 TIMES  DAILY 300 tablet 2   ELDERBERRY PO Take 1 tablet by mouth daily.     fluticasone  (FLONASE ) 50 MCG/ACT nasal spray Place 1 spray into both nostrils  every other day.     levocetirizine (XYZAL ) 5 MG tablet TAKE 1 TABLET BY MOUTH ONCE DAILY IN THE EVENING 90 tablet 0   Multiple Vitamin (MULTIVITAMIN WITH MINERALS) TABS tablet Take 1 tablet by mouth daily.     mupirocin  cream (BACTROBAN ) 2 % Apply 1 Application topically 2 (two) times daily. 60 g 1   pregabalin  (LYRICA ) 75 MG capsule Take 1 capsule (75 mg total) by mouth 2 (two) times daily. 60 capsule 2   Spacer/Aero-Holding Chambers DEVI 1 Units by Does not apply route in the morning, at noon, and at bedtime. 1 Units 0   Calcium -Magnesium-Zinc (CAL-MAG-ZINC PO) Take 1 tablet by mouth 2 (two) times daily. (Patient taking differently: Take 1 tablet by mouth daily.)     Turmeric 500 MG CAPS Take 500 mg by mouth 2 (two) times daily. (Patient taking differently: Take 500 mg by mouth daily.)     No current facility-administered medications on file prior to visit.    Allergies  Allergen Reactions   Pineapple Shortness Of Breath, Swelling and Hives    Swelling of tongue    Chocolate Hives   Coconut (Cocos Nucifera) Hives and Rash   Strawberry Extract Hives and Rash   Wheat Hives   Flavoring Agent Rash    Social History   Socioeconomic History   Marital status: Widowed    Spouse name: Not on file   Number of children: 2   Years of education:  Not on file   Highest education level: Associate degree: occupational, technical, or vocational program  Occupational History   Not on file  Tobacco Use   Smoking status: Never    Passive exposure: Never   Smokeless tobacco: Never  Vaping Use   Vaping status: Never Used  Substance and Sexual Activity   Alcohol  use: Yes    Alcohol /week: 1.0 standard drink of alcohol     Types: 1 Glasses of wine per week   Drug use: Never   Sexual activity: Not on file  Other Topics Concern   Not on file  Social History Narrative   Not on file   Social Drivers of Health   Financial Resource Strain: Low Risk  (10/13/2023)   Overall Financial Resource  Strain (CARDIA)    Difficulty of Paying Living Expenses: Not hard at all  Food Insecurity: Food Insecurity Present (10/13/2023)   Hunger Vital Sign    Worried About Running Out of Food in the Last Year: Sometimes true    Ran Out of Food in the Last Year: Often true  Transportation Needs: No Transportation Needs (10/13/2023)   PRAPARE - Administrator, Civil Service (Medical): No    Lack of Transportation (Non-Medical): No  Physical Activity: Insufficiently Active (10/13/2023)   Exercise Vital Sign    Days of Exercise per Week: 3 days    Minutes of Exercise per Session: 30 min  Stress: No Stress Concern Present (10/13/2023)   [employer] of Occupational Health - Occupational Stress Questionnaire    Feeling of Stress: Not at all  Social Connections: Moderately Isolated (10/13/2023)   Social Connection and Isolation Panel    Frequency of Communication with Friends and Family: More than three times a week    Frequency of Social Gatherings with Friends and Family: Once a week    Attends Religious Services: More than 4 times per year    Active Member of [employer] or Organizations: No    Attends Banker Meetings: Not on file    Marital Status: Widowed  Intimate Partner Violence: Not At Risk (04/13/2023)   Humiliation, Afraid, Rape, and Kick questionnaire    Fear of Current or Ex-Partner: No    Emotionally Abused: No    Physically Abused: No    Sexually Abused: No    Family History  Problem Relation Age of Onset   Colon polyps Mother 20   Hypertension Mother    Asthma Mother    Allergies Mother    Breast cancer Sister    Heart disease Sister    Hypertension Sister    Stroke Sister    Diabetes Sister    Colon cancer Neg Hx    Esophageal cancer Neg Hx    Stomach cancer Neg Hx    Pancreatic cancer Neg Hx    Rectal cancer Neg Hx     Past Surgical History:  Procedure Laterality Date   ANTERIOR HIP REVISION Left 06/06/2018   Procedure: LEFT HIP WOUND  DEHISCENCE POSSIBLE HEAD AND LINER EXCHANGE;  Surgeon: Fidel Rogue, MD;  Location: [facility];  Service: Orthopedics;  Laterality: Left;   COLONOSCOPY  2015   DECOMPRESSIVE LUMBAR LAMINECTOMY LEVEL 2 N/A 09/22/2023   Procedure: DECOMPRESSIVE LUMBAR LAMINECTOMY LEVEL 2;  Surgeon: Georgina Ozell LABOR, MD;  Location: [facility];  Service: Orthopedics;  Laterality: N/A;  Lumbar 2 and Lumbar 3 segmental laminectomies with partial medial facetectomies   DIAGNOSTIC LAPAROSCOPY     tubal preg-took ovary and tube  LAPAROSCOPY FOR ECTOPIC PREGNANCY  1990s   LEFT SALPINGECTOMY AND RIGHT OOPHORECTOMY FOR ABNORMALITY   LUMBAR LAMINECTOMY N/A 09/20/2021   Procedure: L3-4 AND L4-5 CENTRAL LUMBAR LAMINECTOMIES;  Surgeon: Lucilla Lynwood BRAVO, MD;  Location: [facility];  Service: Orthopedics;  Laterality: N/A;   MASS EXCISION  02/15/2012   Procedure: EXCISION MASS;  Surgeon: Donnice DELENA Robinsons, MD;  Location: [facility];  Service: Orthopedics;  Laterality: Right;  Excision of Right Small Volar Mass   MYELOGRAM     TONSILLECTOMY  age 57   TOTAL HIP ARTHROPLASTY Left 04/25/2018   Procedure: TOTAL HIP ARTHROPLASTY ANTERIOR APPROACH;  Surgeon: Fidel Rogue, MD;  Location: [facility];  Service: Orthopedics;  Laterality: Left;   TRIGGER FINGER RELEASE Right 2018   thumb   WISDOM TOOTH EXTRACTION  age 29    ROS: Review of Systems Negative except as stated above  PHYSICAL EXAM: BP 132/83   Pulse 77   Temp (!) 97.4 F (36.3 C) (Oral)   Resp 16   Ht 5' 5 (1.651 m)   Wt 229 lb 12.8 oz (104.2 kg)   LMP 05/04/2012   SpO2 95%   BMI 38.24 kg/m   Wt Readings from Last 3 Encounters:  11/23/23 229 lb 12.8 oz (104.2 kg)  11/16/23 225 lb (102.1 kg)  11/02/23 225 lb (102.1 kg)   Physical Exam HENT:     Head: Normocephalic and atraumatic.     Nose: Nose normal.     Mouth/Throat:     Mouth: Mucous membranes are moist.     Pharynx: Oropharynx is clear.  Eyes:     Extraocular Movements: Extraocular movements  intact.     Conjunctiva/sclera: Conjunctivae normal.     Pupils: Pupils are equal, round, and reactive to light.  Cardiovascular:     Rate and Rhythm: Normal rate and regular rhythm.     Pulses: Normal pulses.     Heart sounds: Normal heart sounds.  Pulmonary:     Effort: Pulmonary effort is normal.     Breath sounds: Normal breath sounds.  Musculoskeletal:        General: Normal range of motion.     Cervical back: Normal range of motion and neck supple.  Neurological:     General: No focal deficit present.     Mental Status: She is alert and oriented to person, place, and time.  Psychiatric:        Mood and Affect: Mood normal.        Behavior: Behavior normal.     ASSESSMENT AND PLAN: 1. Encounter for weight management (Primary) 2. BMI 38.0-38.9,adult - Patient gained 4 pounds since previous weight check.  - Continue Phentermine  as prescribed. Counseled on medication adherence/adverse effects. - Follow-up with primary provider in 4 weeks or sooner if needed. - phentermine  37.5 MG capsule; Take 1 capsule (37.5 mg total) by mouth every morning.  Dispense: 30 capsule; Refill: 0  3. Immunization due - Administered.  - Flu vaccine trivalent PF, 6mos and older(Flulaval,Afluria,Fluarix,Fluzone)   Patient was given the opportunity to ask questions.  Patient verbalized understanding of the plan and was able to repeat key elements of the plan. Patient was given clear instructions to go to Emergency Department or return to medical center if symptoms don't improve, worsen, or new problems develop.The patient verbalized understanding.   Orders Placed This Encounter  Procedures   Flu vaccine trivalent PF, 6mos and older(Flulaval,Afluria,Fluarix,Fluzone)     Requested Prescriptions   Signed Prescriptions Disp  Refills   phentermine  37.5 MG capsule 30 capsule 0    Sig: Take 1 capsule (37.5 mg total) by mouth every morning.    Return in about 4 weeks (around 12/21/2023) for  Follow-Up or next available weight check.  Greig JINNY Drones, NP

## 2023-12-04 ENCOUNTER — Ambulatory Visit: Attending: Family

## 2023-12-04 DIAGNOSIS — M6281 Muscle weakness (generalized): Secondary | ICD-10-CM | POA: Diagnosis present

## 2023-12-04 DIAGNOSIS — M25552 Pain in left hip: Secondary | ICD-10-CM | POA: Diagnosis present

## 2023-12-04 DIAGNOSIS — R262 Difficulty in walking, not elsewhere classified: Secondary | ICD-10-CM | POA: Diagnosis present

## 2023-12-04 DIAGNOSIS — R2689 Other abnormalities of gait and mobility: Secondary | ICD-10-CM | POA: Insufficient documentation

## 2023-12-04 NOTE — Therapy (Signed)
 OUTPATIENT PHYSICAL THERAPY TREATMENT   Patient Name: Darlene Crosby MRN: [id number] DOB:1965/10/21, 58 y.o., female Today's Date: 12/04/2023  END OF SESSION:  PT End of Session - 12/04/23 1309     Visit Number 2    Number of Visits 17    Date for Recertification  01/15/24    Authorization Type [employer] Dual Complete    PT Start Time 1315    PT Stop Time 1355    PT Time Calculation (min) 40 min    Activity Tolerance Patient tolerated treatment well    Behavior During Therapy WFL for tasks assessed/performed           Past Medical History:  Diagnosis Date   Asthma    followed by pcp   Bronchitis    chronic   Hypertension    OA (osteoarthritis)    left knee, right shoulder, left hip   Pneumonia    a couple years ago per pt on 09/15/21   Seasonal allergies    Wears glasses    Past Surgical History:  Procedure Laterality Date   ANTERIOR HIP REVISION Left 06/06/2018   Procedure: LEFT HIP WOUND DEHISCENCE POSSIBLE HEAD AND LINER EXCHANGE;  Surgeon: Fidel Rogue, MD;  Location: [facility];  Service: Orthopedics;  Laterality: Left;   COLONOSCOPY  2015   DECOMPRESSIVE LUMBAR LAMINECTOMY LEVEL 2 N/A 09/22/2023   Procedure: DECOMPRESSIVE LUMBAR LAMINECTOMY LEVEL 2;  Surgeon: Georgina Ozell LABOR, MD;  Location: [facility];  Service: Orthopedics;  Laterality: N/A;  Lumbar 2 and Lumbar 3 segmental laminectomies with partial medial facetectomies   DIAGNOSTIC LAPAROSCOPY     tubal preg-took ovary and tube   LAPAROSCOPY FOR ECTOPIC PREGNANCY  1990s   LEFT SALPINGECTOMY AND RIGHT OOPHORECTOMY FOR ABNORMALITY   LUMBAR LAMINECTOMY N/A 09/20/2021   Procedure: L3-4 AND L4-5 CENTRAL LUMBAR LAMINECTOMIES;  Surgeon: Lucilla Lynwood BRAVO, MD;  Location: [facility];  Service: Orthopedics;  Laterality: N/A;   MASS EXCISION  02/15/2012   Procedure: EXCISION MASS;  Surgeon: Donnice LABOR Robinsons, MD;  Location: [facility];  Service: Orthopedics;  Laterality: Right;  Excision of Right Small Volar Mass    MYELOGRAM     TONSILLECTOMY  age 11   TOTAL HIP ARTHROPLASTY Left 04/25/2018   Procedure: TOTAL HIP ARTHROPLASTY ANTERIOR APPROACH;  Surgeon: Fidel Rogue, MD;  Location: [facility];  Service: Orthopedics;  Laterality: Left;   TRIGGER FINGER RELEASE Right 2018   thumb   WISDOM TOOTH EXTRACTION  age 69   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 04/12/2022   Status post lumbar laminectomy 09/20/2021   Body mass index (BMI) 37.0-37.9, adult 03/14/2019   Lumbar stenosis without neurogenic claudication 03/14/2019   Hyperlipidemia 02/08/2019   Lumbar spondylosis 10/25/2018   Degeneration of lumbar intervertebral disc 10/01/2018   Surgical wound dehiscence 06/06/2018   Osteoarthritis of left hip 04/25/2018   Spinal stenosis of lumbar region 02/23/2018   Pain in left knee 01/10/2018   Trigger thumb of right hand 03/30/2017   Symptomatic mammary hypertrophy 11/28/2014   Dyspnea 03/26/2013   Asthma vs VCD  02/28/2013   Hypertensive disorder 06/07/2012   Pyogenic granuloma 02/15/2012    PCP: Lorren Greig PARAS, NP  REFERRING PROVIDER: Georgina Ozell LABOR, MD  REFERRING DIAG: R29.898 (ICD-10-CM) - Weakness of left hip   THERAPY DIAG:  Pain in left hip  Muscle weakness (generalized)  Other  abnormalities of gait and mobility  Rationale for Evaluation and Treatment: Rehabilitation  ONSET DATE: 09/22/2023  SUBJECTIVE:   SUBJECTIVE STATEMENT: Pt presents to PT with reports of L anterior thigh numbness. Has been compliant with HEP.   EVAL: Pt presents to PT s/p decompressive lumbar laminectomy performed by Dr. Georgina on 09/22/23. Pt notes that pain is occasionally in L anterior thigh and is especially present when fatigued. Denies sensation change in L LE. Symptoms of pain have resolved really well post surgery. She is frustrated by her continued inability to flex L hip in  seated position.    PERTINENT HISTORY: L THA, Lumbar decompression, HTN  PAIN:  Are you having pain?  Yes: NPRS scale: 0/10 Worst: 8/10 Pain location: L anterior thigh Pain description: sore Aggravating factors: squats, stairs Relieving factors: heat  PRECAUTIONS: None  RED FLAGS: None   WEIGHT BEARING RESTRICTIONS: No  FALLS:  Has patient fallen in last 6 months? No  LIVING ENVIRONMENT: Lives with: lives with their family Lives in: House/apartment Stairs: 1 STE Has following equipment at home: Single point cane  PLOF: Independent  PATIENT GOALS: improve L LE strength, get back to being able to do line dancing without limitation   NEXT MD VISIT: 12/18/2023  OBJECTIVE:  Note: Objective measures were completed at Evaluation unless otherwise noted.  DIAGNOSTIC FINDINGS: N/A  PATIENT SURVEYS:  LEFS  Extreme difficulty/unable (0), Quite a bit of difficulty (1), Moderate difficulty (2), Little difficulty (3), No difficulty (4) Survey date:  11/20/2023  Any of your usual work, housework or school activities 2  2. Usual hobbies, recreational or sporting activities 3  3. Getting into/out of the bath 3  4. Walking between rooms 4  5. Putting on socks/shoes 3  6. Squatting  1  7. Lifting an object, like a bag of groceries from the floor 1  8. Performing light activities around your home 2  9. Performing heavy activities around your home 3  10. Getting into/out of a car 1  11. Walking 2 blocks 4  12. Walking 1 mile 4  13. Going up/down 10 stairs (1 flight) 3  14. Standing for 1 hour 3  15.  sitting for 1 hour 4  16. Running on even ground 0  17. Running on uneven ground 0  18. Making sharp turns while running fast 0  19. Hopping  1  20. Rolling over in bed 1  Score total:  42/80     COGNITION: Overall cognitive status: Within functional limits for tasks assessed     SENSATION: Light touch: Impaired   POSTURE: increased lumbar lordosis and flexed trunk    PALPATION: Slight TTP to proximal L thigh  LOWER EXTREMITY ROM:  Active ROM Right eval Left eval  Hip flexion 110 45  Hip extension    Hip abduction    Hip adduction    Hip internal rotation    Hip external rotation    Knee flexion    Knee extension    Ankle dorsiflexion    Ankle plantarflexion    Ankle inversion    Ankle eversion     (Blank rows = not tested)  LOWER EXTREMITY MMT:  MMT Right eval Left eval  Hip flexion 4 2+  Hip extension    Hip abduction 4 4  Hip adduction    Hip internal rotation    Hip external rotation    Knee flexion 5 5  Knee extension 5 5  Ankle dorsiflexion    Ankle  plantarflexion    Ankle inversion    Ankle eversion     (Blank rows = not tested)  LOWER EXTREMITY SPECIAL TESTS:  DNT  FUNCTIONAL TESTS:  30 Second Sit to Stand: 11 reps  GAIT: Distance walked: 1ft Assistive device utilized: None Level of assistance: Complete Independence Comments: antalgic gait L LE   TREATMENT: [facility] Adult PT Treatment:                                                DATE: 12/04/2023 Therapeutic Exercise: Supine QS x 10 - 5 hold Supine SLR 2x10 SAQ 2x15 3# L Standing march 2x10 L STS L foot back 2x10 - no UE Standing TKE with ball 2x10 L  Lateral walk RTB x 3 laps at counter Standing hip ext 2x10 RTB L LAQ 2x10 3# L  [facility] Adult PT Treatment:                                                DATE: 11/20/2023 Therapeutic Exercise: Supine QS x 5 - 5 hold Supine SLR x 5 L Supine heel slide x 5 L S/L hip abd x 5 L STS x 10 - no UE  PATIENT EDUCATION:  Education details: eval findings, LEFS, HEP, POC Person educated: Patient Education method: Explanation, Demonstration, and Handouts Education comprehension: verbalized understanding and returned demonstration  HOME EXERCISE PROGRAM: Access Code: [email or web address] URL: [email or web address] Date: 11/20/2023 Prepared by: Alm Kingdom  Exercises - Supine Quad Set  - 1 x daily -  7 x weekly - 2 sets - 10 reps - 5 sec hold - Active Straight Leg Raise with Quad Set (Mirrored)  - 1 x daily - 7 x weekly - 3 sets - 10 reps - Supine Heel Slide (Mirrored)  - 1 x daily - 7 x weekly - 3 sets - 10 reps - 3 sec hold - Sidelying Hip Abduction  - 1 x daily - 7 x weekly - 2-3 sets - 10 reps - Sit to Stand Without Arm Support  - 1 x daily - 7 x weekly - 2-3 sets - 10 reps  ASSESSMENT:  CLINICAL IMPRESSION: Pt was able to complete all prescribed exercises with no adverse effect. Did have LE fatigue at end of session, continues to demo L hip flex weakness. Pt continues to benefit from skilled PT services, will continue to progressed as tolerated per POC.   EVAL: Patient is a 58 y.o. F who was seen today for physical therapy evaluation and treatment for continued L LE weakness s/p lumbar laminectomy performed on 09/22/23. Physical findings are consistent with surgery and recovery timeline as pt demonstrates decrease in L LE strength and general decrease in functional mobility. LEFS scores shows she is operating below PLOF for home ADLs and higher level mobility. Pt would benefit from skilled PT services working on improving LE strength and mobility post operatively in order to improve function and decrease L thigh pain.   OBJECTIVE IMPAIRMENTS: Abnormal gait, decreased activity tolerance, decreased balance, decreased endurance, decreased mobility, difficulty walking, decreased ROM, decreased strength, and pain   ACTIVITY LIMITATIONS: standing, squatting, stairs, transfers, and locomotion level  PARTICIPATION LIMITATIONS: meal prep, cleaning, driving, shopping, community activity, and yard work  PERSONAL  FACTORS: Time since onset of injury/illness/exacerbation and 3+ comorbidities: L THA, Lumbar decompression, HTN are also affecting patient's functional outcome.   REHAB POTENTIAL: Excellent  CLINICAL DECISION MAKING: Stable/uncomplicated  EVALUATION COMPLEXITY: Low   GOALS: Goals  reviewed with patient? No  SHORT TERM GOALS: Target date: 12/11/2023   Pt will be compliant and knowledgeable with initial HEP for improved comfort and carryover Baseline: initial HEP given  Goal status: INITIAL  2.  Pt will self report L LE pain no greater than 5/10 for improved comfort and functional ability Baseline: 8/10 at worst Goal status: INITIAL   LONG TERM GOALS: Target date: 01/15/2024   Pt will improve LEFS to no less than 52/80 as proxy for functional improvement with home ADLs and higher level community activity Baseline: 42/80 Goal status: INITIAL   2.  Pt will self report L LE pain no greater than 1-2/10 for improved comfort and functional ability Baseline: 8/10 at worst Goal status: INITIAL   3.  Pt will increase 30 Second Sit to Stand rep count to no less than 15 reps for improved balance, strength, and functional mobility Baseline: 11 reps  Goal status: INITIAL   4.  Pt will improve L hip flexion MMT to no less than 4/5 for improved functional mobility and decreased pain Baseline: 2+/5 Goal status: INITIAL  5.  Pt will improve L active hip flexion in supine to no less than 90 degrees for improved functional mobility Baseline: 45 degrees Goal status: INITIAL   PLAN:  PT FREQUENCY: 2x/week  PT DURATION: 8 weeks  PLANNED INTERVENTIONS: 97164- PT Re-evaluation, 97110-Therapeutic exercises, 97530- Therapeutic activity, [id number]- Neuromuscular re-education, 97535- Self Care, [id number]- Manual therapy, [id number]- Gait training, [phone]- Electrical stimulation (unattended), [id number]- Electrical stimulation (manual), 20560 (1-2 muscles), 20561 (3+ muscles)- Dry Needling, Patient/Family education, Cryotherapy, and Moist heat  PLAN FOR NEXT SESSION: assess HEP response, LE strengthening with emphasis on L hip flexion   Alm JAYSON Kingdom, PT 12/04/2023, 2:16 PM

## 2023-12-06 ENCOUNTER — Other Ambulatory Visit: Payer: Self-pay | Admitting: Orthopedic Surgery

## 2023-12-06 ENCOUNTER — Ambulatory Visit

## 2023-12-06 DIAGNOSIS — M25552 Pain in left hip: Secondary | ICD-10-CM | POA: Diagnosis not present

## 2023-12-06 DIAGNOSIS — M6281 Muscle weakness (generalized): Secondary | ICD-10-CM

## 2023-12-06 DIAGNOSIS — R2689 Other abnormalities of gait and mobility: Secondary | ICD-10-CM

## 2023-12-06 MED ORDER — OXYCODONE HCL 5 MG PO TABS: 5.0000 mg | ORAL_TABLET | Freq: Four times a day (QID) | ORAL | 0 refills | Status: AC | PRN

## 2023-12-06 NOTE — Therapy (Signed)
 OUTPATIENT PHYSICAL THERAPY TREATMENT   Patient Name: RIKO LUMSDEN MRN: [id number] DOB:1965-04-26, 58 y.o., female Today's Date: 12/06/2023  END OF SESSION:  PT End of Session - 12/06/23 1050     Visit Number 3    Number of Visits 17    Date for Recertification  01/15/24    Authorization Type [employer] Dual Complete    PT Start Time 1100    PT Stop Time 1138    PT Time Calculation (min) 38 min    Activity Tolerance Patient tolerated treatment well    Behavior During Therapy WFL for tasks assessed/performed            Past Medical History:  Diagnosis Date   Asthma    followed by pcp   Bronchitis    chronic   Hypertension    OA (osteoarthritis)    left knee, right shoulder, left hip   Pneumonia    a couple years ago per pt on 09/15/21   Seasonal allergies    Wears glasses    Past Surgical History:  Procedure Laterality Date   ANTERIOR HIP REVISION Left 06/06/2018   Procedure: LEFT HIP WOUND DEHISCENCE POSSIBLE HEAD AND LINER EXCHANGE;  Surgeon: Fidel Rogue, MD;  Location: [facility];  Service: Orthopedics;  Laterality: Left;   COLONOSCOPY  2015   DECOMPRESSIVE LUMBAR LAMINECTOMY LEVEL 2 N/A 09/22/2023   Procedure: DECOMPRESSIVE LUMBAR LAMINECTOMY LEVEL 2;  Surgeon: Georgina Ozell LABOR, MD;  Location: [facility];  Service: Orthopedics;  Laterality: N/A;  Lumbar 2 and Lumbar 3 segmental laminectomies with partial medial facetectomies   DIAGNOSTIC LAPAROSCOPY     tubal preg-took ovary and tube   LAPAROSCOPY FOR ECTOPIC PREGNANCY  1990s   LEFT SALPINGECTOMY AND RIGHT OOPHORECTOMY FOR ABNORMALITY   LUMBAR LAMINECTOMY N/A 09/20/2021   Procedure: L3-4 AND L4-5 CENTRAL LUMBAR LAMINECTOMIES;  Surgeon: Lucilla Lynwood BRAVO, MD;  Location: [facility];  Service: Orthopedics;  Laterality: N/A;   MASS EXCISION  02/15/2012   Procedure: EXCISION MASS;  Surgeon: Donnice LABOR Robinsons, MD;  Location: [facility];  Service: Orthopedics;  Laterality: Right;  Excision of Right Small Volar Mass    MYELOGRAM     TONSILLECTOMY  age 80   TOTAL HIP ARTHROPLASTY Left 04/25/2018   Procedure: TOTAL HIP ARTHROPLASTY ANTERIOR APPROACH;  Surgeon: Fidel Rogue, MD;  Location: [facility];  Service: Orthopedics;  Laterality: Left;   TRIGGER FINGER RELEASE Right 2018   thumb   WISDOM TOOTH EXTRACTION  age 73   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 04/12/2022   Status post lumbar laminectomy 09/20/2021   Body mass index (BMI) 37.0-37.9, adult 03/14/2019   Lumbar stenosis without neurogenic claudication 03/14/2019   Hyperlipidemia 02/08/2019   Lumbar spondylosis 10/25/2018   Degeneration of lumbar intervertebral disc 10/01/2018   Surgical wound dehiscence 06/06/2018   Osteoarthritis of left hip 04/25/2018   Spinal stenosis of lumbar region 02/23/2018   Pain in left knee 01/10/2018   Trigger thumb of right hand 03/30/2017   Symptomatic mammary hypertrophy 11/28/2014   Dyspnea 03/26/2013   Asthma vs VCD  02/28/2013   Hypertensive disorder 06/07/2012   Pyogenic granuloma 02/15/2012    PCP: Lorren Greig PARAS, NP  REFERRING PROVIDER: Georgina Ozell LABOR, MD  REFERRING DIAG: R29.898 (ICD-10-CM) - Weakness of left hip   THERAPY DIAG:  Pain in left hip  Muscle weakness (generalized)  Other abnormalities of gait and mobility  Rationale for Evaluation and Treatment: Rehabilitation  ONSET DATE: 09/22/2023  SUBJECTIVE:   SUBJECTIVE STATEMENT: Pt presents to PT with continued L thigh numbness and weakness. Continues HEP compliance with no adverse effect.   EVAL: Pt presents to PT s/p decompressive lumbar laminectomy performed by Dr. Georgina on 09/22/23. Pt notes that pain is occasionally in L anterior thigh and is especially present when fatigued. Denies sensation change in L LE. Symptoms of pain have resolved really well post surgery. She is frustrated by her continued  inability to flex L hip in seated position.    PERTINENT HISTORY: L THA, Lumbar decompression, HTN  PAIN:  Are you having pain?  Yes: NPRS scale: 0/10 Worst: 8/10 Pain location: L anterior thigh Pain description: sore Aggravating factors: squats, stairs Relieving factors: heat  PRECAUTIONS: None  RED FLAGS: None   WEIGHT BEARING RESTRICTIONS: No  FALLS:  Has patient fallen in last 6 months? No  LIVING ENVIRONMENT: Lives with: lives with their family Lives in: House/apartment Stairs: 1 STE Has following equipment at home: Single point cane  PLOF: Independent  PATIENT GOALS: improve L LE strength, get back to being able to do line dancing without limitation   NEXT MD VISIT: 12/18/2023  OBJECTIVE:  Note: Objective measures were completed at Evaluation unless otherwise noted.  DIAGNOSTIC FINDINGS: N/A  PATIENT SURVEYS:  LEFS  Extreme difficulty/unable (0), Quite a bit of difficulty (1), Moderate difficulty (2), Little difficulty (3), No difficulty (4) Survey date:  11/20/2023  Any of your usual work, housework or school activities 2  2. Usual hobbies, recreational or sporting activities 3  3. Getting into/out of the bath 3  4. Walking between rooms 4  5. Putting on socks/shoes 3  6. Squatting  1  7. Lifting an object, like a bag of groceries from the floor 1  8. Performing light activities around your home 2  9. Performing heavy activities around your home 3  10. Getting into/out of a car 1  11. Walking 2 blocks 4  12. Walking 1 mile 4  13. Going up/down 10 stairs (1 flight) 3  14. Standing for 1 hour 3  15.  sitting for 1 hour 4  16. Running on even ground 0  17. Running on uneven ground 0  18. Making sharp turns while running fast 0  19. Hopping  1  20. Rolling over in bed 1  Score total:  42/80     COGNITION: Overall cognitive status: Within functional limits for tasks assessed     SENSATION: Light touch: Impaired   POSTURE: increased lumbar  lordosis and flexed trunk   PALPATION: Slight TTP to proximal L thigh  LOWER EXTREMITY ROM:  Active ROM Right eval Left eval  Hip flexion 110 45  Hip extension    Hip abduction    Hip adduction    Hip internal rotation    Hip external rotation    Knee flexion    Knee extension    Ankle dorsiflexion    Ankle plantarflexion    Ankle inversion    Ankle eversion     (Blank rows = not tested)  LOWER EXTREMITY MMT:  MMT Right eval Left eval  Hip flexion 4 2+  Hip extension    Hip abduction 4 4  Hip adduction    Hip internal rotation    Hip external rotation    Knee flexion 5 5  Knee extension 5 5  Ankle dorsiflexion  Ankle plantarflexion    Ankle inversion    Ankle eversion     (Blank rows = not tested)  LOWER EXTREMITY SPECIAL TESTS:  DNT  FUNCTIONAL TESTS:  30 Second Sit to Stand: 11 reps  GAIT: Distance walked: 46ft Assistive device utilized: None Level of assistance: Complete Independence Comments: antalgic gait L LE   TREATMENT: [facility] Adult PT Treatment:                                                DATE: 12/06/2023 NuStep lvl 5 LE only x 4 min for functional activity tolerance Supine QS x 10 - 5 hold Supine SLR 2x10 ea Bridge GTB 2x10 Hooklying clamshell 2x15 GTB STS L foot back 2x10 - no UE Seated knee ext 3x10 10# L only Functional squat B UE 2x10  Step up 6in fwd L leading 2x10 1 UE  [facility] Adult PT Treatment:                                                DATE: 12/04/2023 Therapeutic Exercise: Supine QS x 10 - 5 hold Supine SLR 2x10 SAQ 2x15 3# L Standing march 2x10 L STS L foot back 2x10 - no UE Standing TKE with ball 2x10 L  Lateral walk RTB x 3 laps at counter Standing hip ext 2x10 RTB L LAQ 2x10 3# L  [facility] Adult PT Treatment:                                                DATE: 11/20/2023 Therapeutic Exercise: Supine QS x 5 - 5 hold Supine SLR x 5 L Supine heel slide x 5 L S/L hip abd x 5 L STS x 10 - no UE  PATIENT  EDUCATION:  Education details: eval findings, LEFS, HEP, POC Person educated: Patient Education method: Explanation, Demonstration, and Handouts Education comprehension: verbalized understanding and returned demonstration  HOME EXERCISE PROGRAM: Access Code: [email or web address] URL: [email or web address] Date: 11/20/2023 Prepared by: Alm Kingdom  Exercises - Supine Quad Set  - 1 x daily - 7 x weekly - 2 sets - 10 reps - 5 sec hold - Active Straight Leg Raise with Quad Set (Mirrored)  - 1 x daily - 7 x weekly - 3 sets - 10 reps - Supine Heel Slide (Mirrored)  - 1 x daily - 7 x weekly - 3 sets - 10 reps - 3 sec hold - Sidelying Hip Abduction  - 1 x daily - 7 x weekly - 2-3 sets - 10 reps - Sit to Stand Without Arm Support  - 1 x daily - 7 x weekly - 2-3 sets - 10 reps  ASSESSMENT:  CLINICAL IMPRESSION: Pt was able to complete all prescribed exercises with no adverse effect. Therapy today continued to focus on improving L thigh strength and functional activity tolerance. Continues to demo L quad and hip flexor weakness. Pt continues to benefit from skilled PT services, will continue to progressed as tolerated per POC.   EVAL: Patient is a 58 y.o. F who was seen today for physical therapy evaluation and treatment for  continued L LE weakness s/p lumbar laminectomy performed on 09/22/23. Physical findings are consistent with surgery and recovery timeline as pt demonstrates decrease in L LE strength and general decrease in functional mobility. LEFS scores shows she is operating below PLOF for home ADLs and higher level mobility. Pt would benefit from skilled PT services working on improving LE strength and mobility post operatively in order to improve function and decrease L thigh pain.   OBJECTIVE IMPAIRMENTS: Abnormal gait, decreased activity tolerance, decreased balance, decreased endurance, decreased mobility, difficulty walking, decreased ROM, decreased strength, and pain   ACTIVITY  LIMITATIONS: standing, squatting, stairs, transfers, and locomotion level  PARTICIPATION LIMITATIONS: meal prep, cleaning, driving, shopping, community activity, and yard work  PERSONAL FACTORS: Time since onset of injury/illness/exacerbation and 3+ comorbidities: L THA, Lumbar decompression, HTN are also affecting patient's functional outcome.   REHAB POTENTIAL: Excellent  CLINICAL DECISION MAKING: Stable/uncomplicated  EVALUATION COMPLEXITY: Low   GOALS: Goals reviewed with patient? No  SHORT TERM GOALS: Target date: 12/11/2023   Pt will be compliant and knowledgeable with initial HEP for improved comfort and carryover Baseline: initial HEP given  Goal status: INITIAL  2.  Pt will self report L LE pain no greater than 5/10 for improved comfort and functional ability Baseline: 8/10 at worst Goal status: INITIAL   LONG TERM GOALS: Target date: 01/15/2024   Pt will improve LEFS to no less than 52/80 as proxy for functional improvement with home ADLs and higher level community activity Baseline: 42/80 Goal status: INITIAL   2.  Pt will self report L LE pain no greater than 1-2/10 for improved comfort and functional ability Baseline: 8/10 at worst Goal status: INITIAL   3.  Pt will increase 30 Second Sit to Stand rep count to no less than 15 reps for improved balance, strength, and functional mobility Baseline: 11 reps  Goal status: INITIAL   4.  Pt will improve L hip flexion MMT to no less than 4/5 for improved functional mobility and decreased pain Baseline: 2+/5 Goal status: INITIAL  5.  Pt will improve L active hip flexion in supine to no less than 90 degrees for improved functional mobility Baseline: 45 degrees Goal status: INITIAL   PLAN:  PT FREQUENCY: 2x/week  PT DURATION: 8 weeks  PLANNED INTERVENTIONS: 97164- PT Re-evaluation, 97110-Therapeutic exercises, 97530- Therapeutic activity, [id number]- Neuromuscular re-education, 97535- Self Care, 02859- Manual  therapy, [id number]- Gait training, [phone]- Electrical stimulation (unattended), [id number]- Electrical stimulation (manual), 20560 (1-2 muscles), 20561 (3+ muscles)- Dry Needling, Patient/Family education, Cryotherapy, and Moist heat  PLAN FOR NEXT SESSION: assess HEP response, LE strengthening with emphasis on L hip flexion   Alm JAYSON Kingdom, PT 12/06/2023, 11:39 AM

## 2023-12-07 DIAGNOSIS — M19041 Primary osteoarthritis, right hand: Secondary | ICD-10-CM | POA: Diagnosis not present

## 2023-12-11 ENCOUNTER — Encounter: Payer: Self-pay | Admitting: Physical Therapy

## 2023-12-11 ENCOUNTER — Ambulatory Visit: Admitting: Physical Therapy

## 2023-12-11 DIAGNOSIS — M6281 Muscle weakness (generalized): Secondary | ICD-10-CM

## 2023-12-11 DIAGNOSIS — M25552 Pain in left hip: Secondary | ICD-10-CM | POA: Diagnosis not present

## 2023-12-11 NOTE — Therapy (Signed)
 OUTPATIENT PHYSICAL THERAPY TREATMENT   Patient Name: Darlene Crosby MRN: [id number] DOB:11-16-1965, 58 y.o., female Today's Date: 12/11/2023  END OF SESSION:  PT End of Session - 12/11/23 1052     Visit Number 4    Number of Visits 17    Date for Recertification  01/15/24    Authorization Type [employer] Dual Complete    PT Start Time 1100    PT Stop Time 1138    PT Time Calculation (min) 38 min            Past Medical History:  Diagnosis Date   Asthma    followed by pcp   Bronchitis    chronic   Hypertension    OA (osteoarthritis)    left knee, right shoulder, left hip   Pneumonia    a couple years ago per pt on 09/15/21   Seasonal allergies    Wears glasses    Past Surgical History:  Procedure Laterality Date   ANTERIOR HIP REVISION Left 06/06/2018   Procedure: LEFT HIP WOUND DEHISCENCE POSSIBLE HEAD AND LINER EXCHANGE;  Surgeon: Fidel Rogue, MD;  Location: [facility];  Service: Orthopedics;  Laterality: Left;   COLONOSCOPY  2015   DECOMPRESSIVE LUMBAR LAMINECTOMY LEVEL 2 N/A 09/22/2023   Procedure: DECOMPRESSIVE LUMBAR LAMINECTOMY LEVEL 2;  Surgeon: Georgina Ozell LABOR, MD;  Location: [facility];  Service: Orthopedics;  Laterality: N/A;  Lumbar 2 and Lumbar 3 segmental laminectomies with partial medial facetectomies   DIAGNOSTIC LAPAROSCOPY     tubal preg-took ovary and tube   LAPAROSCOPY FOR ECTOPIC PREGNANCY  1990s   LEFT SALPINGECTOMY AND RIGHT OOPHORECTOMY FOR ABNORMALITY   LUMBAR LAMINECTOMY N/A 09/20/2021   Procedure: L3-4 AND L4-5 CENTRAL LUMBAR LAMINECTOMIES;  Surgeon: Lucilla Lynwood BRAVO, MD;  Location: [facility];  Service: Orthopedics;  Laterality: N/A;   MASS EXCISION  02/15/2012   Procedure: EXCISION MASS;  Surgeon: Donnice LABOR Robinsons, MD;  Location: [facility];  Service: Orthopedics;  Laterality: Right;  Excision of Right Small Volar Mass   MYELOGRAM     TONSILLECTOMY  age 25   TOTAL HIP ARTHROPLASTY Left 04/25/2018   Procedure: TOTAL HIP  ARTHROPLASTY ANTERIOR APPROACH;  Surgeon: Fidel Rogue, MD;  Location: [facility];  Service: Orthopedics;  Laterality: Left;   TRIGGER FINGER RELEASE Right 2018   thumb   WISDOM TOOTH EXTRACTION  age 70   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 04/12/2022   Status post lumbar laminectomy 09/20/2021   Body mass index (BMI) 37.0-37.9, adult 03/14/2019   Lumbar stenosis without neurogenic claudication 03/14/2019   Hyperlipidemia 02/08/2019   Lumbar spondylosis 10/25/2018   Degeneration of lumbar intervertebral disc 10/01/2018   Surgical wound dehiscence 06/06/2018   Osteoarthritis of left hip 04/25/2018   Spinal stenosis of lumbar region 02/23/2018   Pain in left knee 01/10/2018   Trigger thumb of right hand 03/30/2017   Symptomatic mammary hypertrophy 11/28/2014   Dyspnea 03/26/2013   Asthma vs VCD  02/28/2013   Hypertensive disorder 06/07/2012   Pyogenic granuloma 02/15/2012    PCP: Lorren Greig PARAS, NP  REFERRING PROVIDER: Georgina Ozell LABOR, MD  REFERRING DIAG: R29.898 (ICD-10-CM) - Weakness of left hip   THERAPY DIAG:  Pain in left hip  Muscle weakness (generalized)  Rationale for Evaluation and Treatment: Rehabilitation  ONSET DATE: 09/22/2023  SUBJECTIVE:   SUBJECTIVE STATEMENT: Had surgery on  Thursday on my right middle finger. They trimmed the bone and added cartilage. I've been coughing and its been making my thigh hurt more.   EVAL: Pt presents to PT s/p decompressive lumbar laminectomy performed by Dr. Georgina on 09/22/23. Pt notes that pain is occasionally in L anterior thigh and is especially present when fatigued. Denies sensation change in L LE. Symptoms of pain have resolved really well post surgery. She is frustrated by her continued inability to flex L hip in seated position.    PERTINENT HISTORY: L THA, Lumbar decompression, HTN  PAIN:   Are you having pain?  Yes: NPRS scale: 0/10 Worst: 8/10 Pain location: L anterior thigh Pain description: sore Aggravating factors: squats, stairs Relieving factors: heat  PRECAUTIONS: None  RED FLAGS: None   WEIGHT BEARING RESTRICTIONS: No  FALLS:  Has patient fallen in last 6 months? No  LIVING ENVIRONMENT: Lives with: lives with their family Lives in: House/apartment Stairs: 1 STE Has following equipment at home: Single point cane  PLOF: Independent  PATIENT GOALS: improve L LE strength, get back to being able to do line dancing without limitation   NEXT MD VISIT: 12/18/2023  OBJECTIVE:  Note: Objective measures were completed at Evaluation unless otherwise noted.  DIAGNOSTIC FINDINGS: N/A  PATIENT SURVEYS:  LEFS  Extreme difficulty/unable (0), Quite a bit of difficulty (1), Moderate difficulty (2), Little difficulty (3), No difficulty (4) Survey date:  11/20/2023  Any of your usual work, housework or school activities 2  2. Usual hobbies, recreational or sporting activities 3  3. Getting into/out of the bath 3  4. Walking between rooms 4  5. Putting on socks/shoes 3  6. Squatting  1  7. Lifting an object, like a bag of groceries from the floor 1  8. Performing light activities around your home 2  9. Performing heavy activities around your home 3  10. Getting into/out of a car 1  11. Walking 2 blocks 4  12. Walking 1 mile 4  13. Going up/down 10 stairs (1 flight) 3  14. Standing for 1 hour 3  15.  sitting for 1 hour 4  16. Running on even ground 0  17. Running on uneven ground 0  18. Making sharp turns while running fast 0  19. Hopping  1  20. Rolling over in bed 1  Score total:  42/80     COGNITION: Overall cognitive status: Within functional limits for tasks assessed     SENSATION: Light touch: Impaired   POSTURE: increased lumbar lordosis and flexed trunk   PALPATION: Slight TTP to proximal L thigh  LOWER EXTREMITY ROM:  Active ROM  Right eval Left eval  Hip flexion 110 45  Hip extension    Hip abduction    Hip adduction    Hip internal rotation    Hip external rotation    Knee flexion    Knee extension    Ankle dorsiflexion    Ankle plantarflexion    Ankle inversion    Ankle eversion     (Blank rows = not tested)  LOWER EXTREMITY MMT:  MMT Right eval Left eval  Hip flexion 4 2+  Hip extension    Hip abduction 4 4  Hip adduction    Hip internal rotation    Hip external rotation    Knee flexion 5 5  Knee extension 5 5  Ankle dorsiflexion    Ankle plantarflexion    Ankle inversion    Ankle eversion     (  Blank rows = not tested)  LOWER EXTREMITY SPECIAL TESTS:  DNT  FUNCTIONAL TESTS:  30 Second Sit to Stand: 11 reps  GAIT: Distance walked: 9ft Assistive device utilized: None Level of assistance: Complete Independence Comments: antalgic gait L LE   TREATMENT: [facility] Adult PT Treatment:                                                DATE: 12/11/23 Therapeutic Exercise: Nustep L5 Le only  LAQ x 15 SLR 5 x 2 left Clam GTB Bridge GTB 2x10 Heel slide left x 10 SAQ 10 x 2 left  STS LLE back x 10     [facility] Adult PT Treatment:                                                DATE: 12/06/2023 NuStep lvl 5 LE only x 4 min for functional activity tolerance Supine QS x 10 - 5 hold Supine SLR 2x10 ea Bridge GTB 2x10 Hooklying clamshell 2x15 GTB STS L foot back 2x10 - no UE Seated knee ext 3x10 10# L only Functional squat B UE 2x10  Step up 6in fwd L leading 2x10 1 UE  [facility] Adult PT Treatment:                                                DATE: 12/04/2023 Therapeutic Exercise: Supine QS x 10 - 5 hold Supine SLR 2x10 SAQ 2x15 3# L Standing march 2x10 L STS L foot back 2x10 - no UE Standing TKE with ball 2x10 L  Lateral walk RTB x 3 laps at counter Standing hip ext 2x10 RTB L LAQ 2x10 3# L  [facility] Adult PT Treatment:                                                DATE:  11/20/2023 Therapeutic Exercise: Supine QS x 5 - 5 hold Supine SLR x 5 L Supine heel slide x 5 L S/L hip abd x 5 L STS x 10 - no UE  PATIENT EDUCATION:  Education details: eval findings, LEFS, HEP, POC Person educated: Patient Education method: Explanation, Demonstration, and Handouts Education comprehension: verbalized understanding and returned demonstration  HOME EXERCISE PROGRAM: Access Code: [email or web address] URL: [email or web address] Date: 11/20/2023 Prepared by: Alm Kingdom  Exercises - Supine Quad Set  - 1 x daily - 7 x weekly - 2 sets - 10 reps - 5 sec hold - Active Straight Leg Raise with Quad Set (Mirrored)  - 1 x daily - 7 x weekly - 3 sets - 10 reps - Supine Heel Slide (Mirrored)  - 1 x daily - 7 x weekly - 3 sets - 10 reps - 3 sec hold - Sidelying Hip Abduction  - 1 x daily - 7 x weekly - 2-3 sets - 10 reps - Sit to Stand Without Arm Support  - 1 x daily - 7 x weekly - 2-3 sets - 10  reps  ASSESSMENT:  CLINICAL IMPRESSION: Pt arrives to PT with her right hand in a cast after 3rd digit surgery. She will have hard cast for 2 weeks and begin hand therapy early November.  Therapy today continued to focus on improving L thigh strength and functional activity tolerance. Continues to demo L quad and hip flexor weakness. Pt continues to benefit from skilled PT services, will continue to progressed as tolerated per POC.   EVAL: Patient is a 58 y.o. F who was seen today for physical therapy evaluation and treatment for continued L LE weakness s/p lumbar laminectomy performed on 09/22/23. Physical findings are consistent with surgery and recovery timeline as pt demonstrates decrease in L LE strength and general decrease in functional mobility. LEFS scores shows she is operating below PLOF for home ADLs and higher level mobility. Pt would benefit from skilled PT services working on improving LE strength and mobility post operatively in order to improve function and decrease L  thigh pain.   OBJECTIVE IMPAIRMENTS: Abnormal gait, decreased activity tolerance, decreased balance, decreased endurance, decreased mobility, difficulty walking, decreased ROM, decreased strength, and pain   ACTIVITY LIMITATIONS: standing, squatting, stairs, transfers, and locomotion level  PARTICIPATION LIMITATIONS: meal prep, cleaning, driving, shopping, community activity, and yard work  PERSONAL FACTORS: Time since onset of injury/illness/exacerbation and 3+ comorbidities: L THA, Lumbar decompression, HTN are also affecting patient's functional outcome.   REHAB POTENTIAL: Excellent  CLINICAL DECISION MAKING: Stable/uncomplicated  EVALUATION COMPLEXITY: Low   GOALS: Goals reviewed with patient? No  SHORT TERM GOALS: Target date: 12/11/2023   Pt will be compliant and knowledgeable with initial HEP for improved comfort and carryover Baseline: initial HEP given  Goal status: MET  2.  Pt will self report L LE pain no greater than 5/10 for improved comfort and functional ability Baseline: 8/10 at worst 12/11/23: 7/10 now that I have started coughing  Goal status: ONGOING   LONG TERM GOALS: Target date: 01/15/2024   Pt will improve LEFS to no less than 52/80 as proxy for functional improvement with home ADLs and higher level community activity Baseline: 42/80 Goal status: INITIAL   2.  Pt will self report L LE pain no greater than 1-2/10 for improved comfort and functional ability Baseline: 8/10 at worst Goal status: INITIAL   3.  Pt will increase 30 Second Sit to Stand rep count to no less than 15 reps for improved balance, strength, and functional mobility Baseline: 11 reps  Goal status: INITIAL   4.  Pt will improve L hip flexion MMT to no less than 4/5 for improved functional mobility and decreased pain Baseline: 2+/5 Goal status: INITIAL  5.  Pt will improve L active hip flexion in supine to no less than 90 degrees for improved functional mobility Baseline: 45  degrees Goal status: INITIAL   PLAN:  PT FREQUENCY: 2x/week  PT DURATION: 8 weeks  PLANNED INTERVENTIONS: [id number]- PT Re-evaluation, 97110-Therapeutic exercises, 97530- Therapeutic activity, 97112- Neuromuscular re-education, 97535- Self Care, [id number]- Manual therapy, [id number]- Gait training, [phone]- Electrical stimulation (unattended), [id number]- Electrical stimulation (manual), 20560 (1-2 muscles), [id number] (3+ muscles)- Dry Needling, Patient/Family education, Cryotherapy, and Moist heat  PLAN FOR NEXT SESSION: assess HEP response, LE strengthening with emphasis on L hip flexion   Harlene Persons, PTA 12/11/23 11:41 AM Phone: [phone] Fax: [phone]

## 2023-12-13 ENCOUNTER — Ambulatory Visit

## 2023-12-13 DIAGNOSIS — M25552 Pain in left hip: Secondary | ICD-10-CM | POA: Diagnosis not present

## 2023-12-13 DIAGNOSIS — R2689 Other abnormalities of gait and mobility: Secondary | ICD-10-CM

## 2023-12-13 DIAGNOSIS — M6281 Muscle weakness (generalized): Secondary | ICD-10-CM

## 2023-12-13 NOTE — Therapy (Signed)
 OUTPATIENT PHYSICAL THERAPY TREATMENT   Patient Name: Darlene Crosby MRN: [id number] DOB:10-Apr-1965, 58 y.o., female Today's Date: 12/13/2023  END OF SESSION:  PT End of Session - 12/13/23 1053     Visit Number 5    Number of Visits 17    Date for Recertification  01/15/24    Authorization Type [employer] Dual Complete    PT Start Time 1058    PT Stop Time 1137    PT Time Calculation (min) 39 min    Activity Tolerance Patient tolerated treatment well    Behavior During Therapy WFL for tasks assessed/performed             Past Medical History:  Diagnosis Date   Asthma    followed by pcp   Bronchitis    chronic   Hypertension    OA (osteoarthritis)    left knee, right shoulder, left hip   Pneumonia    a couple years ago per pt on 09/15/21   Seasonal allergies    Wears glasses    Past Surgical History:  Procedure Laterality Date   ANTERIOR HIP REVISION Left 06/06/2018   Procedure: LEFT HIP WOUND DEHISCENCE POSSIBLE HEAD AND LINER EXCHANGE;  Surgeon: Fidel Rogue, MD;  Location: [facility];  Service: Orthopedics;  Laterality: Left;   COLONOSCOPY  2015   DECOMPRESSIVE LUMBAR LAMINECTOMY LEVEL 2 N/A 09/22/2023   Procedure: DECOMPRESSIVE LUMBAR LAMINECTOMY LEVEL 2;  Surgeon: Georgina Ozell LABOR, MD;  Location: [facility];  Service: Orthopedics;  Laterality: N/A;  Lumbar 2 and Lumbar 3 segmental laminectomies with partial medial facetectomies   DIAGNOSTIC LAPAROSCOPY     tubal preg-took ovary and tube   LAPAROSCOPY FOR ECTOPIC PREGNANCY  1990s   LEFT SALPINGECTOMY AND RIGHT OOPHORECTOMY FOR ABNORMALITY   LUMBAR LAMINECTOMY N/A 09/20/2021   Procedure: L3-4 AND L4-5 CENTRAL LUMBAR LAMINECTOMIES;  Surgeon: Lucilla Lynwood BRAVO, MD;  Location: [facility];  Service: Orthopedics;  Laterality: N/A;   MASS EXCISION  02/15/2012   Procedure: EXCISION MASS;  Surgeon: Donnice LABOR Robinsons, MD;  Location: [facility];  Service: Orthopedics;  Laterality: Right;  Excision of Right Small Volar  Mass   MYELOGRAM     TONSILLECTOMY  age 17   TOTAL HIP ARTHROPLASTY Left 04/25/2018   Procedure: TOTAL HIP ARTHROPLASTY ANTERIOR APPROACH;  Surgeon: Fidel Rogue, MD;  Location: [facility];  Service: Orthopedics;  Laterality: Left;   TRIGGER FINGER RELEASE Right 2018   thumb   WISDOM TOOTH EXTRACTION  age 89   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 04/12/2022   Status post lumbar laminectomy 09/20/2021   Body mass index (BMI) 37.0-37.9, adult 03/14/2019   Lumbar stenosis without neurogenic claudication 03/14/2019   Hyperlipidemia 02/08/2019   Lumbar spondylosis 10/25/2018   Degeneration of lumbar intervertebral disc 10/01/2018   Surgical wound dehiscence 06/06/2018   Osteoarthritis of left hip 04/25/2018   Spinal stenosis of lumbar region 02/23/2018   Pain in left knee 01/10/2018   Trigger thumb of right hand 03/30/2017   Symptomatic mammary hypertrophy 11/28/2014   Dyspnea 03/26/2013   Asthma vs VCD  02/28/2013   Hypertensive disorder 06/07/2012   Pyogenic granuloma 02/15/2012    PCP: Lorren Greig PARAS, NP  REFERRING PROVIDER: Georgina Ozell LABOR, MD  REFERRING DIAG: R29.898 (ICD-10-CM) - Weakness of left hip   THERAPY DIAG:  Pain in left hip  Muscle weakness (generalized)  Other abnormalities of gait and mobility  Rationale for Evaluation and Treatment: Rehabilitation  ONSET DATE: 09/22/2023  SUBJECTIVE:   SUBJECTIVE STATEMENT: Pt presents to PT with no current pain, is frustrated about her inability to use her R arm right now but otherwise is doing well.   EVAL: Pt presents to PT s/p decompressive lumbar laminectomy performed by Dr. Georgina on 09/22/23. Pt notes that pain is occasionally in L anterior thigh and is especially present when fatigued. Denies sensation change in L LE. Symptoms of pain have resolved really well post surgery. She is frustrated  by her continued inability to flex L hip in seated position.    PERTINENT HISTORY: L THA, Lumbar decompression, HTN  PAIN:  Are you having pain?  Yes: NPRS scale: 0/10 Worst: 8/10 Pain location: L anterior thigh Pain description: sore Aggravating factors: squats, stairs Relieving factors: heat  PRECAUTIONS: None  RED FLAGS: None   WEIGHT BEARING RESTRICTIONS: No  FALLS:  Has patient fallen in last 6 months? No  LIVING ENVIRONMENT: Lives with: lives with their family Lives in: House/apartment Stairs: 1 STE Has following equipment at home: Single point cane  PLOF: Independent  PATIENT GOALS: improve L LE strength, get back to being able to do line dancing without limitation   NEXT MD VISIT: 12/18/2023  OBJECTIVE:  Note: Objective measures were completed at Evaluation unless otherwise noted.  DIAGNOSTIC FINDINGS: N/A  PATIENT SURVEYS:  LEFS  Extreme difficulty/unable (0), Quite a bit of difficulty (1), Moderate difficulty (2), Little difficulty (3), No difficulty (4) Survey date:  11/20/2023  Any of your usual work, housework or school activities 2  2. Usual hobbies, recreational or sporting activities 3  3. Getting into/out of the bath 3  4. Walking between rooms 4  5. Putting on socks/shoes 3  6. Squatting  1  7. Lifting an object, like a bag of groceries from the floor 1  8. Performing light activities around your home 2  9. Performing heavy activities around your home 3  10. Getting into/out of a car 1  11. Walking 2 blocks 4  12. Walking 1 mile 4  13. Going up/down 10 stairs (1 flight) 3  14. Standing for 1 hour 3  15.  sitting for 1 hour 4  16. Running on even ground 0  17. Running on uneven ground 0  18. Making sharp turns while running fast 0  19. Hopping  1  20. Rolling over in bed 1  Score total:  42/80     COGNITION: Overall cognitive status: Within functional limits for tasks assessed     SENSATION: Light touch: Impaired   POSTURE:  increased lumbar lordosis and flexed trunk   PALPATION: Slight TTP to proximal L thigh  LOWER EXTREMITY ROM:  Active ROM Right eval Left eval  Hip flexion 110 45  Hip extension    Hip abduction    Hip adduction    Hip internal rotation    Hip external rotation    Knee flexion    Knee extension    Ankle dorsiflexion    Ankle plantarflexion    Ankle inversion    Ankle eversion     (Blank rows = not tested)  LOWER EXTREMITY MMT:  MMT Right eval Left eval  Hip flexion 4 2+  Hip extension    Hip abduction 4 4  Hip adduction    Hip internal rotation    Hip external rotation    Knee flexion 5 5  Knee  extension 5 5  Ankle dorsiflexion    Ankle plantarflexion    Ankle inversion    Ankle eversion     (Blank rows = not tested)  LOWER EXTREMITY SPECIAL TESTS:  DNT  FUNCTIONAL TESTS:  30 Second Sit to Stand: 11 reps  GAIT: Distance walked: 69ft Assistive device utilized: None Level of assistance: Complete Independence Comments: antalgic gait L LE   TREATMENT: [facility] Adult PT Treatment:                                                DATE: 12/13/2023 NuStep lvl 5 LE only x 4 min for functional activity tolerance Supine QS x 10 - 5 hold Supine SLR 2x10 L Bridge blue band 2x15 Hooklying clamshell 2x15 blue band STS L foot back 2x10 - no UE LAQ 2x10 L 5# Leg press single 3x10 L only 35# Step up 8in fwd L leading x 10 1 UE  [facility] Adult PT Treatment:                                                DATE: 12/11/23 Therapeutic Exercise: Nustep L5 Le only  LAQ x 15 SLR 5 x 2 left Clam GTB Bridge GTB 2x10 Heel slide left x 10 SAQ 10 x 2 left  STS LLE back x 10  [facility] Adult PT Treatment:                                                DATE: 12/06/2023 NuStep lvl 5 LE only x 4 min for functional activity tolerance Supine QS x 10 - 5 hold Supine SLR 2x10 ea Bridge GTB 2x10 Hooklying clamshell 2x15 GTB STS L foot back 2x10 - no UE Seated knee ext 3x10 10# L  only Functional squat B UE 2x10  Step up 6in fwd L leading 2x10 1 UE  [facility] Adult PT Treatment:                                                DATE: 12/04/2023 Therapeutic Exercise: Supine QS x 10 - 5 hold Supine SLR 2x10 SAQ 2x15 3# L Standing march 2x10 L STS L foot back 2x10 - no UE Standing TKE with ball 2x10 L  Lateral walk RTB x 3 laps at counter Standing hip ext 2x10 RTB L LAQ 2x10 3# L  [facility] Adult PT Treatment:                                                DATE: 11/20/2023 Therapeutic Exercise: Supine QS x 5 - 5 hold Supine SLR x 5 L Supine heel slide x 5 L S/L hip abd x 5 L STS x 10 - no UE  PATIENT EDUCATION:  Education details: eval findings, LEFS, HEP, POC Person educated: Patient Education method: Explanation, Demonstration,  and Handouts Education comprehension: verbalized understanding and returned demonstration  HOME EXERCISE PROGRAM: Access Code: [email or web address] URL: [email or web address] Date: 11/20/2023 Prepared by: Alm Kingdom  Exercises - Supine Quad Set  - 1 x daily - 7 x weekly - 2 sets - 10 reps - 5 sec hold - Active Straight Leg Raise with Quad Set (Mirrored)  - 1 x daily - 7 x weekly - 3 sets - 10 reps - Supine Heel Slide (Mirrored)  - 1 x daily - 7 x weekly - 3 sets - 10 reps - 3 sec hold - Sidelying Hip Abduction  - 1 x daily - 7 x weekly - 2-3 sets - 10 reps - Sit to Stand Without Arm Support  - 1 x daily - 7 x weekly - 2-3 sets - 10 reps  ASSESSMENT:  CLINICAL IMPRESSION: Pt was able to complete all prescribed exercises with no adverse effect. Today we continued to work on strength and muscle reactivation for proximal L anterior thigh musculature. Pt is progressing well, will continue to progress per POC.   EVAL: Patient is a 58 y.o. F who was seen today for physical therapy evaluation and treatment for continued L LE weakness s/p lumbar laminectomy performed on 09/22/23. Physical findings are consistent with surgery and recovery  timeline as pt demonstrates decrease in L LE strength and general decrease in functional mobility. LEFS scores shows she is operating below PLOF for home ADLs and higher level mobility. Pt would benefit from skilled PT services working on improving LE strength and mobility post operatively in order to improve function and decrease L thigh pain.   OBJECTIVE IMPAIRMENTS: Abnormal gait, decreased activity tolerance, decreased balance, decreased endurance, decreased mobility, difficulty walking, decreased ROM, decreased strength, and pain   ACTIVITY LIMITATIONS: standing, squatting, stairs, transfers, and locomotion level  PARTICIPATION LIMITATIONS: meal prep, cleaning, driving, shopping, community activity, and yard work  PERSONAL FACTORS: Time since onset of injury/illness/exacerbation and 3+ comorbidities: L THA, Lumbar decompression, HTN are also affecting patient's functional outcome.   REHAB POTENTIAL: Excellent  CLINICAL DECISION MAKING: Stable/uncomplicated  EVALUATION COMPLEXITY: Low   GOALS: Goals reviewed with patient? No  SHORT TERM GOALS: Target date: 12/11/2023   Pt will be compliant and knowledgeable with initial HEP for improved comfort and carryover Baseline: initial HEP given  Goal status: MET  2.  Pt will self report L LE pain no greater than 5/10 for improved comfort and functional ability Baseline: 8/10 at worst 12/11/23: 7/10 now that I have started coughing  Goal status: ONGOING   LONG TERM GOALS: Target date: 01/15/2024   Pt will improve LEFS to no less than 52/80 as proxy for functional improvement with home ADLs and higher level community activity Baseline: 42/80 Goal status: INITIAL   2.  Pt will self report L LE pain no greater than 1-2/10 for improved comfort and functional ability Baseline: 8/10 at worst Goal status: INITIAL   3.  Pt will increase 30 Second Sit to Stand rep count to no less than 15 reps for improved balance, strength, and functional  mobility Baseline: 11 reps  Goal status: INITIAL   4.  Pt will improve L hip flexion MMT to no less than 4/5 for improved functional mobility and decreased pain Baseline: 2+/5 Goal status: INITIAL  5.  Pt will improve L active hip flexion in supine to no less than 90 degrees for improved functional mobility Baseline: 45 degrees Goal status: INITIAL   PLAN:  PT FREQUENCY: 2x/week  PT DURATION:  8 weeks  PLANNED INTERVENTIONS: [id number]- PT Re-evaluation, 97110-Therapeutic exercises, 97530- Therapeutic activity, [id number]- Neuromuscular re-education, 97535- Self Care, [id number]- Manual therapy, [id number]- Gait training, [phone]- Electrical stimulation (unattended), [phone]- Electrical stimulation (manual), [phone] (1-2 muscles), [id number] (3+ muscles)- Dry Needling, Patient/Family education, Cryotherapy, and Moist heat  PLAN FOR NEXT SESSION: assess HEP response, LE strengthening with emphasis on L hip flexion   Alm JAYSON Kingdom PT  12/13/23 11:48 AM

## 2023-12-18 ENCOUNTER — Ambulatory Visit: Admitting: Physical Therapy

## 2023-12-18 ENCOUNTER — Ambulatory Visit: Admitting: Orthopedic Surgery

## 2023-12-18 ENCOUNTER — Encounter: Payer: Self-pay | Admitting: Physical Therapy

## 2023-12-18 ENCOUNTER — Ambulatory Visit

## 2023-12-18 DIAGNOSIS — M25552 Pain in left hip: Secondary | ICD-10-CM | POA: Diagnosis not present

## 2023-12-18 DIAGNOSIS — Z9889 Other specified postprocedural states: Secondary | ICD-10-CM | POA: Diagnosis not present

## 2023-12-18 DIAGNOSIS — M6281 Muscle weakness (generalized): Secondary | ICD-10-CM

## 2023-12-18 DIAGNOSIS — R2689 Other abnormalities of gait and mobility: Secondary | ICD-10-CM

## 2023-12-18 DIAGNOSIS — R262 Difficulty in walking, not elsewhere classified: Secondary | ICD-10-CM

## 2023-12-18 NOTE — Therapy (Signed)
 OUTPATIENT PHYSICAL THERAPY TREATMENT   Patient Name: Darlene Crosby MRN: [id number] DOB:December 03, 1965, 58 y.o., female Today's Date: 12/18/2023  END OF SESSION:  PT End of Session - 12/18/23 1104     Visit Number 6    Number of Visits 17    Date for Recertification  01/15/24    Authorization Type [employer] Dual Complete    PT Start Time 1102    PT Stop Time 1140    PT Time Calculation (min) 38 min             Past Medical History:  Diagnosis Date   Asthma    followed by pcp   Bronchitis    chronic   Hypertension    OA (osteoarthritis)    left knee, right shoulder, left hip   Pneumonia    a couple years ago per pt on 09/15/21   Seasonal allergies    Wears glasses    Past Surgical History:  Procedure Laterality Date   ANTERIOR HIP REVISION Left 06/06/2018   Procedure: LEFT HIP WOUND DEHISCENCE POSSIBLE HEAD AND LINER EXCHANGE;  Surgeon: Fidel Rogue, MD;  Location: [facility];  Service: Orthopedics;  Laterality: Left;   COLONOSCOPY  2015   DECOMPRESSIVE LUMBAR LAMINECTOMY LEVEL 2 N/A 09/22/2023   Procedure: DECOMPRESSIVE LUMBAR LAMINECTOMY LEVEL 2;  Surgeon: Georgina Ozell LABOR, MD;  Location: [facility];  Service: Orthopedics;  Laterality: N/A;  Lumbar 2 and Lumbar 3 segmental laminectomies with partial medial facetectomies   DIAGNOSTIC LAPAROSCOPY     tubal preg-took ovary and tube   LAPAROSCOPY FOR ECTOPIC PREGNANCY  1990s   LEFT SALPINGECTOMY AND RIGHT OOPHORECTOMY FOR ABNORMALITY   LUMBAR LAMINECTOMY N/A 09/20/2021   Procedure: L3-4 AND L4-5 CENTRAL LUMBAR LAMINECTOMIES;  Surgeon: Lucilla Lynwood BRAVO, MD;  Location: [facility];  Service: Orthopedics;  Laterality: N/A;   MASS EXCISION  02/15/2012   Procedure: EXCISION MASS;  Surgeon: Donnice LABOR Robinsons, MD;  Location: [facility];  Service: Orthopedics;  Laterality: Right;  Excision of Right Small Volar Mass   MYELOGRAM     TONSILLECTOMY  age 31   TOTAL HIP ARTHROPLASTY Left 04/25/2018   Procedure: TOTAL HIP  ARTHROPLASTY ANTERIOR APPROACH;  Surgeon: Fidel Rogue, MD;  Location: [facility];  Service: Orthopedics;  Laterality: Left;   TRIGGER FINGER RELEASE Right 2018   thumb   WISDOM TOOTH EXTRACTION  age 37   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 04/12/2022   Status post lumbar laminectomy 09/20/2021   Body mass index (BMI) 37.0-37.9, adult 03/14/2019   Lumbar stenosis without neurogenic claudication 03/14/2019   Hyperlipidemia 02/08/2019   Lumbar spondylosis 10/25/2018   Degeneration of lumbar intervertebral disc 10/01/2018   Surgical wound dehiscence 06/06/2018   Osteoarthritis of left hip 04/25/2018   Spinal stenosis of lumbar region 02/23/2018   Pain in left knee 01/10/2018   Trigger thumb of right hand 03/30/2017   Symptomatic mammary hypertrophy 11/28/2014   Dyspnea 03/26/2013   Asthma vs VCD  02/28/2013   Hypertensive disorder 06/07/2012   Pyogenic granuloma 02/15/2012    PCP: Lorren Greig PARAS, NP  REFERRING PROVIDER: Georgina Ozell LABOR, MD  REFERRING DIAG: R29.898 (ICD-10-CM) - Weakness of left hip   THERAPY DIAG:  Pain in left hip  Muscle weakness (generalized)  Other abnormalities of gait and mobility  Difficulty in walking, not elsewhere classified  Rationale for Evaluation and  Treatment: Rehabilitation  ONSET DATE: 09/22/2023  SUBJECTIVE:   SUBJECTIVE STATEMENT: Pt presents to PT with no current pain, is frustrated about her inability to use her R arm right now but otherwise is doing well.   EVAL: Pt presents to PT s/p decompressive lumbar laminectomy performed by Dr. Georgina on 09/22/23. Pt notes that pain is occasionally in L anterior thigh and is especially present when fatigued. Denies sensation change in L LE. Symptoms of pain have resolved really well post surgery. She is frustrated by her continued inability to flex L hip in seated  position.    PERTINENT HISTORY: L THA, Lumbar decompression, HTN  PAIN:  Are you having pain?  Yes: NPRS scale: 0/10 Worst: 8/10 Pain location: L anterior thigh Pain description: sore Aggravating factors: squats, stairs Relieving factors: heat  PRECAUTIONS: None  RED FLAGS: None   WEIGHT BEARING RESTRICTIONS: No  FALLS:  Has patient fallen in last 6 months? No  LIVING ENVIRONMENT: Lives with: lives with their family Lives in: House/apartment Stairs: 1 STE Has following equipment at home: Single point cane  PLOF: Independent  PATIENT GOALS: improve L LE strength, get back to being able to do line dancing without limitation   NEXT MD VISIT: 12/18/2023  OBJECTIVE:  Note: Objective measures were completed at Evaluation unless otherwise noted.  DIAGNOSTIC FINDINGS: N/A  PATIENT SURVEYS:  LEFS  Extreme difficulty/unable (0), Quite a bit of difficulty (1), Moderate difficulty (2), Little difficulty (3), No difficulty (4) Survey date:  11/20/2023  Any of your usual work, housework or school activities 2  2. Usual hobbies, recreational or sporting activities 3  3. Getting into/out of the bath 3  4. Walking between rooms 4  5. Putting on socks/shoes 3  6. Squatting  1  7. Lifting an object, like a bag of groceries from the floor 1  8. Performing light activities around your home 2  9. Performing heavy activities around your home 3  10. Getting into/out of a car 1  11. Walking 2 blocks 4  12. Walking 1 mile 4  13. Going up/down 10 stairs (1 flight) 3  14. Standing for 1 hour 3  15.  sitting for 1 hour 4  16. Running on even ground 0  17. Running on uneven ground 0  18. Making sharp turns while running fast 0  19. Hopping  1  20. Rolling over in bed 1  Score total:  42/80     COGNITION: Overall cognitive status: Within functional limits for tasks assessed     SENSATION: Light touch: Impaired   POSTURE: increased lumbar lordosis and flexed trunk    PALPATION: Slight TTP to proximal L thigh  LOWER EXTREMITY ROM:  Active ROM Right eval Left eval  Hip flexion 110 45  Hip extension    Hip abduction    Hip adduction    Hip internal rotation    Hip external rotation    Knee flexion    Knee extension    Ankle dorsiflexion    Ankle plantarflexion    Ankle inversion    Ankle eversion     (Blank rows = not tested)  LOWER EXTREMITY MMT:  MMT Right eval Left eval  Hip flexion 4 2+  Hip extension    Hip abduction 4 4  Hip adduction    Hip internal rotation    Hip external rotation    Knee flexion 5 5  Knee extension 5 5  Ankle dorsiflexion    Ankle plantarflexion  Ankle inversion    Ankle eversion     (Blank rows = not tested)  LOWER EXTREMITY SPECIAL TESTS:  DNT  FUNCTIONAL TESTS:  30 Second Sit to Stand: 11 reps  GAIT: Distance walked: 81ft Assistive device utilized: None Level of assistance: Complete Independence Comments: antalgic gait L LE   TREATMENT: [facility] Adult PT Treatment:                                                DATE: 12/18/2023 NuStep lvl 5 LE only x 5 min for functional activity tolerance Seated march x 10 each  Supine SLR 2x10 L Bridge blue band 2x15 Bridge with ball squeeze 10 x 2  Hooklying clamshell 2x15 blue band STS L foot back 2x10 - no UE Standing march  Heel raises  LAQ 2x10 L 5# with ball squeeze  Leg press single 3x10 L only 35#     [facility] Adult PT Treatment:                                                DATE: 12/13/2023 NuStep lvl 5 LE only x 4 min for functional activity tolerance Supine QS x 10 - 5 hold Supine SLR 2x10 L Bridge blue band 2x15 Hooklying clamshell 2x15 blue band STS L foot back 2x10 - no UE LAQ 2x10 L 5# Leg press single 3x10 L only 35# Step up 8in fwd L leading x 10 1 UE  [facility] Adult PT Treatment:                                                DATE: 12/11/23 Therapeutic Exercise: Nustep L5 Le only  LAQ x 15 SLR 5 x 2 left Clam  GTB Bridge GTB 2x10 Heel slide left x 10 SAQ 10 x 2 left  STS LLE back x 10  [facility] Adult PT Treatment:                                                DATE: 12/06/2023 NuStep lvl 5 LE only x 4 min for functional activity tolerance Supine QS x 10 - 5 hold Supine SLR 2x10 ea Bridge GTB 2x10 Hooklying clamshell 2x15 GTB STS L foot back 2x10 - no UE Seated knee ext 3x10 10# L only Functional squat B UE 2x10  Step up 6in fwd L leading 2x10 1 UE  [facility] Adult PT Treatment:                                                DATE: 12/04/2023 Therapeutic Exercise: Supine QS x 10 - 5 hold Supine SLR 2x10 SAQ 2x15 3# L Standing march 2x10 L STS L foot back 2x10 - no UE Standing TKE with ball 2x10 L  Lateral walk RTB x 3 laps at counter Standing hip ext  2x10 RTB L LAQ 2x10 3# L  [facility] Adult PT Treatment:                                                DATE: 11/20/2023 Therapeutic Exercise: Supine QS x 5 - 5 hold Supine SLR x 5 L Supine heel slide x 5 L S/L hip abd x 5 L STS x 10 - no UE  PATIENT EDUCATION:  Education details: eval findings, LEFS, HEP, POC Person educated: Patient Education method: Explanation, Demonstration, and Handouts Education comprehension: verbalized understanding and returned demonstration  HOME EXERCISE PROGRAM: Access Code: [email or web address] URL: [email or web address] Date: 11/20/2023 Prepared by: Alm Kingdom  Exercises - Supine Quad Set  - 1 x daily - 7 x weekly - 2 sets - 10 reps - 5 sec hold - Active Straight Leg Raise with Quad Set (Mirrored)  - 1 x daily - 7 x weekly - 3 sets - 10 reps - Supine Heel Slide (Mirrored)  - 1 x daily - 7 x weekly - 3 sets - 10 reps - 3 sec hold - Sidelying Hip Abduction  - 1 x daily - 7 x weekly - 2-3 sets - 10 reps - Sit to Stand Without Arm Support  - 1 x daily - 7 x weekly - 2-3 sets - 10 reps  ASSESSMENT:  CLINICAL IMPRESSION: Pt was able to complete all prescribed exercises with no adverse effect. Today we  continued to work on strength and muscle reactivation for proximal L anterior thigh musculature. Pt is progressing well, will continue to progress per POC.   EVAL: Patient is a 58 y.o. F who was seen today for physical therapy evaluation and treatment for continued L LE weakness s/p lumbar laminectomy performed on 09/22/23. Physical findings are consistent with surgery and recovery timeline as pt demonstrates decrease in L LE strength and general decrease in functional mobility. LEFS scores shows she is operating below PLOF for home ADLs and higher level mobility. Pt would benefit from skilled PT services working on improving LE strength and mobility post operatively in order to improve function and decrease L thigh pain.   OBJECTIVE IMPAIRMENTS: Abnormal gait, decreased activity tolerance, decreased balance, decreased endurance, decreased mobility, difficulty walking, decreased ROM, decreased strength, and pain   ACTIVITY LIMITATIONS: standing, squatting, stairs, transfers, and locomotion level  PARTICIPATION LIMITATIONS: meal prep, cleaning, driving, shopping, community activity, and yard work  PERSONAL FACTORS: Time since onset of injury/illness/exacerbation and 3+ comorbidities: L THA, Lumbar decompression, HTN are also affecting patient's functional outcome.   REHAB POTENTIAL: Excellent  CLINICAL DECISION MAKING: Stable/uncomplicated  EVALUATION COMPLEXITY: Low   GOALS: Goals reviewed with patient? No  SHORT TERM GOALS: Target date: 12/11/2023   Pt will be compliant and knowledgeable with initial HEP for improved comfort and carryover Baseline: initial HEP given  Goal status: MET  2.  Pt will self report L LE pain no greater than 5/10 for improved comfort and functional ability Baseline: 8/10 at worst 12/11/23: 7/10 now that I have started coughing  Goal status: ONGOING   LONG TERM GOALS: Target date: 01/15/2024   Pt will improve LEFS to no less than 52/80 as proxy for  functional improvement with home ADLs and higher level community activity Baseline: 42/80 Goal status: INITIAL   2.  Pt will self report L LE pain no greater than 1-2/10 for improved  comfort and functional ability Baseline: 8/10 at worst Goal status: INITIAL   3.  Pt will increase 30 Second Sit to Stand rep count to no less than 15 reps for improved balance, strength, and functional mobility Baseline: 11 reps  Goal status: INITIAL   4.  Pt will improve L hip flexion MMT to no less than 4/5 for improved functional mobility and decreased pain Baseline: 2+/5 Goal status: INITIAL  5.  Pt will improve L active hip flexion in supine to no less than 90 degrees for improved functional mobility Baseline: 45 degrees Goal status: INITIAL   PLAN:  PT FREQUENCY: 2x/week  PT DURATION: 8 weeks  PLANNED INTERVENTIONS: [id number]- PT Re-evaluation, 97110-Therapeutic exercises, 97530- Therapeutic activity, [id number]- Neuromuscular re-education, 97535- Self Care, [id number]- Manual therapy, [id number]- Gait training, [phone]- Electrical stimulation (unattended), [id number]- Electrical stimulation (manual), 20560 (1-2 muscles), [id number] (3+ muscles)- Dry Needling, Patient/Family education, Cryotherapy, and Moist heat  PLAN FOR NEXT SESSION: assess HEP response, LE strengthening with emphasis on L hip flexion   Harlene Persons, PTA 12/18/23 11:40 AM Phone: [phone] Fax: [phone]

## 2023-12-18 NOTE — Progress Notes (Signed)
 Orthopedic Surgery Post-operative Office Visit   Procedure: L2/3 laminectomy Date of Surgery: 09/22/2023 (~3 months post-op)   Assessment: Patient is a 58 y.o. who is doing well after surgery. Still has some left hip flexor weakness     Plan: -No spine specific restrictions - Continue to work with PT and do home exercises -Pain management: tylenol  as needed -Return to office in 3 months, x-rays needed at next visit: None   ___________________________________________________________________________     Subjective: Patient has had no significant back or radiating leg pain.  She has been working with physical therapy.  She feels that the strength in her left hip flexor has gotten better with working with therapy and doing home exercises.  It is not completely better.  She is able to ambulate now without any assistive devices.   Objective:   General: no acute distress, appropriate affect Neurologic: alert, answering questions appropriately, following commands Respiratory: unlabored breathing on room air Skin: incision is well healed   MSK (spine):   -Strength exam                                                   Left                  Right   EHL                              5/5                  5/5 TA                                 5/5                  5/5 GSC                             5/5                  5/5 Knee extension            5/5                  5/5 Hip flexion                    4-/5                 5/5   -Sensory exam                           Sensation intact to light touch in L3-S1 nerve distributions of bilateral lower extremities   Imaging: XRs of the lumbar spine from 12/18/2023 were independently reviewed and interpreted, showing disc height loss at L1/2, L2/3, L4/5.  There is small anterior osteophyte formation at those levels.  No evidence of instability on flexion/extension views.  Laminectomy defect seen from L2-L5.  No fracture or dislocation seen.      Patient name: Darlene Crosby Patient MRN: [id number] Date of visit: 12/18/23

## 2023-12-20 ENCOUNTER — Ambulatory Visit

## 2023-12-20 DIAGNOSIS — R2689 Other abnormalities of gait and mobility: Secondary | ICD-10-CM

## 2023-12-20 DIAGNOSIS — M6281 Muscle weakness (generalized): Secondary | ICD-10-CM

## 2023-12-20 DIAGNOSIS — M25552 Pain in left hip: Secondary | ICD-10-CM

## 2023-12-20 NOTE — Therapy (Signed)
 OUTPATIENT PHYSICAL THERAPY TREATMENT   Patient Name: Darlene Crosby MRN: [id number] DOB:10/02/1965, 58 y.o., female Today's Date: 12/20/2023  END OF SESSION:  PT End of Session - 12/20/23 1059     Visit Number 7    Number of Visits 17    Date for Recertification  01/15/24    Authorization Type [employer] Dual Complete    PT Start Time 1100    PT Stop Time 1140    PT Time Calculation (min) 40 min    Activity Tolerance Patient tolerated treatment well              Past Medical History:  Diagnosis Date   Asthma    followed by pcp   Bronchitis    chronic   Hypertension    OA (osteoarthritis)    left knee, right shoulder, left hip   Pneumonia    a couple years ago per pt on 09/15/21   Seasonal allergies    Wears glasses    Past Surgical History:  Procedure Laterality Date   ANTERIOR HIP REVISION Left 06/06/2018   Procedure: LEFT HIP WOUND DEHISCENCE POSSIBLE HEAD AND LINER EXCHANGE;  Surgeon: Fidel Rogue, MD;  Location: [facility];  Service: Orthopedics;  Laterality: Left;   COLONOSCOPY  2015   DECOMPRESSIVE LUMBAR LAMINECTOMY LEVEL 2 N/A 09/22/2023   Procedure: DECOMPRESSIVE LUMBAR LAMINECTOMY LEVEL 2;  Surgeon: Georgina Ozell LABOR, MD;  Location: [facility];  Service: Orthopedics;  Laterality: N/A;  Lumbar 2 and Lumbar 3 segmental laminectomies with partial medial facetectomies   DIAGNOSTIC LAPAROSCOPY     tubal preg-took ovary and tube   LAPAROSCOPY FOR ECTOPIC PREGNANCY  1990s   LEFT SALPINGECTOMY AND RIGHT OOPHORECTOMY FOR ABNORMALITY   LUMBAR LAMINECTOMY N/A 09/20/2021   Procedure: L3-4 AND L4-5 CENTRAL LUMBAR LAMINECTOMIES;  Surgeon: Lucilla Lynwood BRAVO, MD;  Location: [facility];  Service: Orthopedics;  Laterality: N/A;   MASS EXCISION  02/15/2012   Procedure: EXCISION MASS;  Surgeon: Donnice LABOR Robinsons, MD;  Location: [facility];  Service: Orthopedics;  Laterality: Right;  Excision of Right Small Volar Mass   MYELOGRAM     TONSILLECTOMY  age 65   TOTAL HIP  ARTHROPLASTY Left 04/25/2018   Procedure: TOTAL HIP ARTHROPLASTY ANTERIOR APPROACH;  Surgeon: Fidel Rogue, MD;  Location: [facility];  Service: Orthopedics;  Laterality: Left;   TRIGGER FINGER RELEASE Right 2018   thumb   WISDOM TOOTH EXTRACTION  age 32   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 04/12/2022   Status post lumbar laminectomy 09/20/2021   Body mass index (BMI) 37.0-37.9, adult 03/14/2019   Lumbar stenosis without neurogenic claudication 03/14/2019   Hyperlipidemia 02/08/2019   Lumbar spondylosis 10/25/2018   Degeneration of lumbar intervertebral disc 10/01/2018   Surgical wound dehiscence 06/06/2018   Osteoarthritis of left hip 04/25/2018   Spinal stenosis of lumbar region 02/23/2018   Pain in left knee 01/10/2018   Trigger thumb of right hand 03/30/2017   Symptomatic mammary hypertrophy 11/28/2014   Dyspnea 03/26/2013   Asthma vs VCD  02/28/2013   Hypertensive disorder 06/07/2012   Pyogenic granuloma 02/15/2012    PCP: Lorren Greig PARAS, NP  REFERRING PROVIDER: Georgina Ozell LABOR, MD  REFERRING DIAG: R29.898 (ICD-10-CM) - Weakness of left hip   THERAPY DIAG:  Pain in left hip  Muscle weakness (generalized)  Other abnormalities of gait and mobility  Rationale  for Evaluation and Treatment: Rehabilitation  ONSET DATE: 09/22/2023  SUBJECTIVE:   SUBJECTIVE STATEMENT: Pt presents to PT with reports no current L thigh/hip pain. Has been compliant with HEP.   EVAL: Pt presents to PT s/p decompressive lumbar laminectomy performed by Dr. Georgina on 09/22/23. Pt notes that pain is occasionally in L anterior thigh and is especially present when fatigued. Denies sensation change in L LE. Symptoms of pain have resolved really well post surgery. She is frustrated by her continued inability to flex L hip in seated position.    PERTINENT HISTORY: L THA,  Lumbar decompression, HTN  PAIN:  Are you having pain?  Yes: NPRS scale: 0/10 Worst: 8/10 Pain location: L anterior thigh Pain description: sore Aggravating factors: squats, stairs Relieving factors: heat  PRECAUTIONS: None  RED FLAGS: None   WEIGHT BEARING RESTRICTIONS: No  FALLS:  Has patient fallen in last 6 months? No  LIVING ENVIRONMENT: Lives with: lives with their family Lives in: House/apartment Stairs: 1 STE Has following equipment at home: Single point cane  PLOF: Independent  PATIENT GOALS: improve L LE strength, get back to being able to do line dancing without limitation   NEXT MD VISIT: 12/18/2023  OBJECTIVE:  Note: Objective measures were completed at Evaluation unless otherwise noted.  DIAGNOSTIC FINDINGS: N/A  PATIENT SURVEYS:  LEFS  Extreme difficulty/unable (0), Quite a bit of difficulty (1), Moderate difficulty (2), Little difficulty (3), No difficulty (4) Survey date:  11/20/2023  Any of your usual work, housework or school activities 2  2. Usual hobbies, recreational or sporting activities 3  3. Getting into/out of the bath 3  4. Walking between rooms 4  5. Putting on socks/shoes 3  6. Squatting  1  7. Lifting an object, like a bag of groceries from the floor 1  8. Performing light activities around your home 2  9. Performing heavy activities around your home 3  10. Getting into/out of a car 1  11. Walking 2 blocks 4  12. Walking 1 mile 4  13. Going up/down 10 stairs (1 flight) 3  14. Standing for 1 hour 3  15.  sitting for 1 hour 4  16. Running on even ground 0  17. Running on uneven ground 0  18. Making sharp turns while running fast 0  19. Hopping  1  20. Rolling over in bed 1  Score total:  42/80     COGNITION: Overall cognitive status: Within functional limits for tasks assessed     SENSATION: Light touch: Impaired   POSTURE: increased lumbar lordosis and flexed trunk   PALPATION: Slight TTP to proximal L  thigh  LOWER EXTREMITY ROM:  Active ROM Right eval Left eval  Hip flexion 110 45  Hip extension    Hip abduction    Hip adduction    Hip internal rotation    Hip external rotation    Knee flexion    Knee extension    Ankle dorsiflexion    Ankle plantarflexion    Ankle inversion    Ankle eversion     (Blank rows = not tested)  LOWER EXTREMITY MMT:  MMT Right eval Left eval  Hip flexion 4 2+  Hip extension    Hip abduction 4 4  Hip adduction    Hip internal rotation    Hip external rotation    Knee flexion 5 5  Knee extension 5 5  Ankle dorsiflexion    Ankle plantarflexion    Ankle inversion  Ankle eversion     (Blank rows = not tested)  LOWER EXTREMITY SPECIAL TESTS:  DNT  FUNCTIONAL TESTS:  30 Second Sit to Stand: 11 reps  GAIT: Distance walked: 55ft Assistive device utilized: None Level of assistance: Complete Independence Comments: antalgic gait L LE   TREATMENT: [facility] Adult PT Treatment:                                                DATE: 12/20/2023 NuStep lvl 5 LE only x 5 min for functional activity tolerance Standing march 2x10 2# L Single leg press 3x10 45# L Supine SLR 3x10 L Supine march - unable  Bridge with ball 3x10 STS L foot back 2x10 - no UE TKE with GTB 2x10 L Step up 8in L stance 2x10 fwd - no UE Wall squat 2x10   [facility] Adult PT Treatment:                                                DATE: 12/18/2023 NuStep lvl 5 LE only x 5 min for functional activity tolerance Seated march x 10 each  Supine SLR 2x10 L Bridge blue band 2x15 Bridge with ball squeeze 10 x 2  Hooklying clamshell 2x15 blue band STS L foot back 2x10 - no UE Standing march  Heel raises  LAQ 2x10 L 5# with ball squeeze  Leg press single 3x10 L only 35#   [facility] Adult PT Treatment:                                                DATE: 12/13/2023 NuStep lvl 5 LE only x 4 min for functional activity tolerance Supine QS x 10 - 5 hold Supine SLR 2x10 L Bridge  blue band 2x15 Hooklying clamshell 2x15 blue band STS L foot back 2x10 - no UE LAQ 2x10 L 5# Leg press single 3x10 L only 35# Step up 8in fwd L leading x 10 1 UE  [facility] Adult PT Treatment:                                                DATE: 12/11/23 Therapeutic Exercise: Nustep L5 Le only  LAQ x 15 SLR 5 x 2 left Clam GTB Bridge GTB 2x10 Heel slide left x 10 SAQ 10 x 2 left  STS LLE back x 10  [facility] Adult PT Treatment:                                                DATE: 12/06/2023 NuStep lvl 5 LE only x 4 min for functional activity tolerance Supine QS x 10 - 5 hold Supine SLR 2x10 ea Bridge GTB 2x10 Hooklying clamshell 2x15 GTB STS L foot back 2x10 - no UE Seated knee ext 3x10 10# L only Functional squat B UE 2x10  Step up 6in fwd L leading 2x10 1 UE  [facility] Adult PT Treatment:                                                DATE: 12/04/2023 Therapeutic Exercise: Supine QS x 10 - 5 hold Supine SLR 2x10 SAQ 2x15 3# L Standing march 2x10 L STS L foot back 2x10 - no UE Standing TKE with ball 2x10 L  Lateral walk RTB x 3 laps at counter Standing hip ext 2x10 RTB L LAQ 2x10 3# L  [facility] Adult PT Treatment:                                                DATE: 11/20/2023 Therapeutic Exercise: Supine QS x 5 - 5 hold Supine SLR x 5 L Supine heel slide x 5 L S/L hip abd x 5 L STS x 10 - no UE  PATIENT EDUCATION:  Education details: eval findings, LEFS, HEP, POC Person educated: Patient Education method: Explanation, Demonstration, and Handouts Education comprehension: verbalized understanding and returned demonstration  HOME EXERCISE PROGRAM: Access Code: [email or web address] URL: [email or web address] Date: 11/20/2023 Prepared by: Alm Kingdom  Exercises - Supine Quad Set  - 1 x daily - 7 x weekly - 2 sets - 10 reps - 5 sec hold - Active Straight Leg Raise with Quad Set (Mirrored)  - 1 x daily - 7 x weekly - 3 sets - 10 reps - Supine Heel Slide (Mirrored)  - 1 x  daily - 7 x weekly - 3 sets - 10 reps - 3 sec hold - Sidelying Hip Abduction  - 1 x daily - 7 x weekly - 2-3 sets - 10 reps - Sit to Stand Without Arm Support  - 1 x daily - 7 x weekly - 2-3 sets - 10 reps  ASSESSMENT:  CLINICAL IMPRESSION: Pt was able to complete all prescribed exercises with no adverse effect. Today we focused on continued progression of L proximal hip and thigh strengthening. She is making good progress as her L thigh is slowly improving motor control and strength. Will continue to progress as able per POC.   EVAL: Patient is a 58 y.o. F who was seen today for physical therapy evaluation and treatment for continued L LE weakness s/p lumbar laminectomy performed on 09/22/23. Physical findings are consistent with surgery and recovery timeline as pt demonstrates decrease in L LE strength and general decrease in functional mobility. LEFS scores shows she is operating below PLOF for home ADLs and higher level mobility. Pt would benefit from skilled PT services working on improving LE strength and mobility post operatively in order to improve function and decrease L thigh pain.   OBJECTIVE IMPAIRMENTS: Abnormal gait, decreased activity tolerance, decreased balance, decreased endurance, decreased mobility, difficulty walking, decreased ROM, decreased strength, and pain   ACTIVITY LIMITATIONS: standing, squatting, stairs, transfers, and locomotion level  PARTICIPATION LIMITATIONS: meal prep, cleaning, driving, shopping, community activity, and yard work  PERSONAL FACTORS: Time since onset of injury/illness/exacerbation and 3+ comorbidities: L THA, Lumbar decompression, HTN are also affecting patient's functional outcome.   REHAB POTENTIAL: Excellent  CLINICAL DECISION MAKING: Stable/uncomplicated  EVALUATION COMPLEXITY: Low   GOALS: Goals reviewed with patient? No  SHORT  TERM GOALS: Target date: 12/11/2023   Pt will be compliant and knowledgeable with initial HEP for  improved comfort and carryover Baseline: initial HEP given  Goal status: MET  2.  Pt will self report L LE pain no greater than 5/10 for improved comfort and functional ability Baseline: 8/10 at worst 12/11/23: 7/10 now that I have started coughing  Goal status: ONGOING   LONG TERM GOALS: Target date: 01/15/2024   Pt will improve LEFS to no less than 52/80 as proxy for functional improvement with home ADLs and higher level community activity Baseline: 42/80 Goal status: INITIAL   2.  Pt will self report L LE pain no greater than 1-2/10 for improved comfort and functional ability Baseline: 8/10 at worst Goal status: INITIAL   3.  Pt will increase 30 Second Sit to Stand rep count to no less than 15 reps for improved balance, strength, and functional mobility Baseline: 11 reps  Goal status: INITIAL   4.  Pt will improve L hip flexion MMT to no less than 4/5 for improved functional mobility and decreased pain Baseline: 2+/5 Goal status: INITIAL  5.  Pt will improve L active hip flexion in supine to no less than 90 degrees for improved functional mobility Baseline: 45 degrees Goal status: INITIAL   PLAN:  PT FREQUENCY: 2x/week  PT DURATION: 8 weeks  PLANNED INTERVENTIONS: [id number]- PT Re-evaluation, 97110-Therapeutic exercises, 97530- Therapeutic activity, [id number]- Neuromuscular re-education, 97535- Self Care, [id number]- Manual therapy, [id number]- Gait training, [phone]- Electrical stimulation (unattended), [id number]- Electrical stimulation (manual), 20560 (1-2 muscles), [id number] (3+ muscles)- Dry Needling, Patient/Family education, Cryotherapy, and Moist heat  PLAN FOR NEXT SESSION: assess HEP response, LE strengthening with emphasis on L hip flexion   Alm JAYSON Kingdom PT  12/20/23 11:55 AM

## 2023-12-21 ENCOUNTER — Ambulatory Visit: Admitting: Orthopedic Surgery

## 2023-12-21 ENCOUNTER — Other Ambulatory Visit (INDEPENDENT_AMBULATORY_CARE_PROVIDER_SITE_OTHER): Payer: Self-pay

## 2023-12-21 DIAGNOSIS — M25541 Pain in joints of right hand: Secondary | ICD-10-CM

## 2023-12-21 NOTE — Progress Notes (Signed)
   Darlene Crosby - 58 y.o. female MRN [id number]  Date of birth: 10/22/1965  Office Visit Note: Visit Date: 12/21/2023 PCP: Lorren Greig PARAS, NP Referred by: Lorren Greig PARAS, NP  Subjective:  HPI: Darlene Crosby is a 58 y.o. female who presents today for follow up 2 weeks status post right long finger metacarpophalangeal arthroplasty.  Doing well overall, pain is controlled.  Pertinent ROS were reviewed with the patient and found to be negative unless otherwise specified above in HPI.   Assessment & Plan: Visit Diagnoses:  1. Arthralgia of metacarpophalangeal joint, right     Plan: Sutures removed today.  Short arm cast was applied today to allow for further healing at the long finger MCP region.  Follow-up in approximate 2 weeks for repeat clinical and radiographic check, will have her be seen by occupational therapy at that point for fabrication of a removable orthosis and begin range of motion exercises.  Follow-up: No follow-ups on file.   Meds & Orders: No orders of the defined types were placed in this encounter.   Orders Placed This Encounter  Procedures   XR Hand Complete Right     Procedures: No procedures performed       Objective:   Vital Signs: LMP 05/04/2012   Ortho Exam Right hand: - Well-healing longitudinal incision over the dorsal aspect of the long finger MCP region, sutures removed today, skin edges well-approximated without erythema or drainage  Imaging: XR Hand Complete Right Result Date: 12/21/2023 Stable appearance of the long finger MCP region status post prior resection arthroplasty, appropriate spacing is notable between the metacarpal head and proximal phalanx base    Aarian Cleaver Estela) Isella Slatten, M.D. [facility], Hand Surgery

## 2023-12-25 ENCOUNTER — Encounter: Payer: Self-pay | Admitting: Physical Therapy

## 2023-12-25 ENCOUNTER — Ambulatory Visit: Admitting: Physical Therapy

## 2023-12-25 DIAGNOSIS — M6281 Muscle weakness (generalized): Secondary | ICD-10-CM

## 2023-12-25 DIAGNOSIS — M25552 Pain in left hip: Secondary | ICD-10-CM

## 2023-12-25 NOTE — Therapy (Signed)
 OUTPATIENT PHYSICAL THERAPY TREATMENT   Patient Name: Darlene Crosby MRN: [id number] DOB:Sep 20, 1965, 58 y.o., female Today's Date: 12/25/2023  END OF SESSION:  PT End of Session - 12/25/23 1104     Visit Number 8    Number of Visits 17    Date for Recertification  01/15/24    Authorization Type [employer] Dual Complete    PT Start Time 1105    PT Stop Time 1155    PT Time Calculation (min) 50 min              Past Medical History:  Diagnosis Date   Asthma    followed by pcp   Bronchitis    chronic   Hypertension    OA (osteoarthritis)    left knee, right shoulder, left hip   Pneumonia    a couple years ago per pt on 09/15/21   Seasonal allergies    Wears glasses    Past Surgical History:  Procedure Laterality Date   ANTERIOR HIP REVISION Left 06/06/2018   Procedure: LEFT HIP WOUND DEHISCENCE POSSIBLE HEAD AND LINER EXCHANGE;  Surgeon: Fidel Rogue, MD;  Location: [facility];  Service: Orthopedics;  Laterality: Left;   COLONOSCOPY  2015   DECOMPRESSIVE LUMBAR LAMINECTOMY LEVEL 2 N/A 09/22/2023   Procedure: DECOMPRESSIVE LUMBAR LAMINECTOMY LEVEL 2;  Surgeon: Georgina Ozell LABOR, MD;  Location: [facility];  Service: Orthopedics;  Laterality: N/A;  Lumbar 2 and Lumbar 3 segmental laminectomies with partial medial facetectomies   DIAGNOSTIC LAPAROSCOPY     tubal preg-took ovary and tube   LAPAROSCOPY FOR ECTOPIC PREGNANCY  1990s   LEFT SALPINGECTOMY AND RIGHT OOPHORECTOMY FOR ABNORMALITY   LUMBAR LAMINECTOMY N/A 09/20/2021   Procedure: L3-4 AND L4-5 CENTRAL LUMBAR LAMINECTOMIES;  Surgeon: Lucilla Lynwood BRAVO, MD;  Location: [facility];  Service: Orthopedics;  Laterality: N/A;   MASS EXCISION  02/15/2012   Procedure: EXCISION MASS;  Surgeon: Donnice LABOR Robinsons, MD;  Location: [facility];  Service: Orthopedics;  Laterality: Right;  Excision of Right Small Volar Mass   MYELOGRAM     TONSILLECTOMY  age 7   TOTAL HIP ARTHROPLASTY Left 04/25/2018   Procedure: TOTAL HIP  ARTHROPLASTY ANTERIOR APPROACH;  Surgeon: Fidel Rogue, MD;  Location: [facility];  Service: Orthopedics;  Laterality: Left;   TRIGGER FINGER RELEASE Right 2018   thumb   WISDOM TOOTH EXTRACTION  age 55   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 04/12/2022   Status post lumbar laminectomy 09/20/2021   Body mass index (BMI) 37.0-37.9, adult 03/14/2019   Lumbar stenosis without neurogenic claudication 03/14/2019   Hyperlipidemia 02/08/2019   Lumbar spondylosis 10/25/2018   Degeneration of lumbar intervertebral disc 10/01/2018   Surgical wound dehiscence 06/06/2018   Osteoarthritis of left hip 04/25/2018   Spinal stenosis of lumbar region 02/23/2018   Pain in left knee 01/10/2018   Trigger thumb of right hand 03/30/2017   Symptomatic mammary hypertrophy 11/28/2014   Dyspnea 03/26/2013   Asthma vs VCD  02/28/2013   Hypertensive disorder 06/07/2012   Pyogenic granuloma 02/15/2012    PCP: Lorren Greig PARAS, NP  REFERRING PROVIDER: Georgina Ozell LABOR, MD  REFERRING DIAG: R29.898 (ICD-10-CM) - Weakness of left hip   THERAPY DIAG:  Pain in left hip  Muscle weakness (generalized)  Rationale for Evaluation and Treatment: Rehabilitation  ONSET DATE: 09/22/2023  SUBJECTIVE:   SUBJECTIVE STATEMENT: Pt  presents to PT with reports no current L thigh/hip pain. Has been compliant with HEP. Pain level 5/10 or less.   EVAL: Pt presents to PT s/p decompressive lumbar laminectomy performed by Dr. Georgina on 09/22/23. Pt notes that pain is occasionally in L anterior thigh and is especially present when fatigued. Denies sensation change in L LE. Symptoms of pain have resolved really well post surgery. She is frustrated by her continued inability to flex L hip in seated position.    PERTINENT HISTORY: L THA, Lumbar decompression, HTN  PAIN:  Are you having pain?  Yes: NPRS scale:  0/10 Worst: 8/10 Pain location: L anterior thigh Pain description: sore Aggravating factors: squats, stairs Relieving factors: heat  PRECAUTIONS: None  RED FLAGS: None   WEIGHT BEARING RESTRICTIONS: No  FALLS:  Has patient fallen in last 6 months? No  LIVING ENVIRONMENT: Lives with: lives with their family Lives in: House/apartment Stairs: 1 STE Has following equipment at home: Single point cane  PLOF: Independent  PATIENT GOALS: improve L LE strength, get back to being able to do line dancing without limitation   NEXT MD VISIT: 12/18/2023  OBJECTIVE:  Note: Objective measures were completed at Evaluation unless otherwise noted.  DIAGNOSTIC FINDINGS: N/A  PATIENT SURVEYS:  LEFS  Extreme difficulty/unable (0), Quite a bit of difficulty (1), Moderate difficulty (2), Little difficulty (3), No difficulty (4) Survey date:  11/20/2023  Any of your usual work, housework or school activities 2  2. Usual hobbies, recreational or sporting activities 3  3. Getting into/out of the bath 3  4. Walking between rooms 4  5. Putting on socks/shoes 3  6. Squatting  1  7. Lifting an object, like a bag of groceries from the floor 1  8. Performing light activities around your home 2  9. Performing heavy activities around your home 3  10. Getting into/out of a car 1  11. Walking 2 blocks 4  12. Walking 1 mile 4  13. Going up/down 10 stairs (1 flight) 3  14. Standing for 1 hour 3  15.  sitting for 1 hour 4  16. Running on even ground 0  17. Running on uneven ground 0  18. Making sharp turns while running fast 0  19. Hopping  1  20. Rolling over in bed 1  Score total:  42/80     COGNITION: Overall cognitive status: Within functional limits for tasks assessed     SENSATION: Light touch: Impaired   POSTURE: increased lumbar lordosis and flexed trunk   PALPATION: Slight TTP to proximal L thigh  LOWER EXTREMITY ROM:  Active ROM Right eval Left eval  Hip flexion 110 45   Hip extension    Hip abduction    Hip adduction    Hip internal rotation    Hip external rotation    Knee flexion    Knee extension    Ankle dorsiflexion    Ankle plantarflexion    Ankle inversion    Ankle eversion     (Blank rows = not tested)  LOWER EXTREMITY MMT:  MMT Right eval Left eval  Hip flexion 4 2+  Hip extension    Hip abduction 4 4  Hip adduction    Hip internal rotation    Hip external rotation    Knee flexion 5 5  Knee extension 5 5  Ankle dorsiflexion    Ankle plantarflexion    Ankle inversion    Ankle eversion     (Blank rows =  not tested)  LOWER EXTREMITY SPECIAL TESTS:  DNT  FUNCTIONAL TESTS:  30 Second Sit to Stand: 11 reps 12/25/23: sit to stand: 13 reps   GAIT: Distance walked: 37ft Assistive device utilized: None Level of assistance: Complete Independence Comments: antalgic gait L LE   TREATMENT: [facility] Adult PT Treatment:                                                DATE: 12/25/23  Rec Bike L2 x 5 minutes  Standing hip flexion 2# 10 x 2 each ( 1 set knee extended) Standing hp abdct 2# x 10 each  Standing hip ext 2@ x 10 each  Leg press 45# 20 x 2 Supine SLR 2x10 L Supine heel slide x 10 with slide board  Bridge with ball squeeze 3 x 10  Unable to supine march  30 sec STS 13 reps low mat without UE HMP to left thigh x 10 minutes     [facility] Adult PT Treatment:                                                DATE: 12/20/2023 NuStep lvl 5 LE only x 5 min for functional activity tolerance Standing march 2x10 2# L Single leg press 3x10 45# L Supine SLR 3x10 L Supine march - unable  Bridge with ball 3x10 STS L foot back 2x10 - no UE TKE with GTB 2x10 L Step up 8in L stance 2x10 fwd - no UE Wall squat 2x10   [facility] Adult PT Treatment:                                                DATE: 12/18/2023 NuStep lvl 5 LE only x 5 min for functional activity tolerance Seated march x 10 each  Supine SLR 2x10 L Bridge blue band  2x15 Bridge with ball squeeze 10 x 2  Hooklying clamshell 2x15 blue band STS L foot back 2x10 - no UE Standing march  Heel raises  LAQ 2x10 L 5# with ball squeeze  Leg press single 3x10 L only 35#   [facility] Adult PT Treatment:                                                DATE: 12/13/2023 NuStep lvl 5 LE only x 4 min for functional activity tolerance Supine QS x 10 - 5 hold Supine SLR 2x10 L Bridge blue band 2x15 Hooklying clamshell 2x15 blue band STS L foot back 2x10 - no UE LAQ 2x10 L 5# Leg press single 3x10 L only 35# Step up 8in fwd L leading x 10 1 UE  [facility] Adult PT Treatment:                                                DATE: 12/11/23  Therapeutic Exercise: Nustep L5 Le only  LAQ x 15 SLR 5 x 2 left Clam GTB Bridge GTB 2x10 Heel slide left x 10 SAQ 10 x 2 left  STS LLE back x 10  [facility] Adult PT Treatment:                                                DATE: 12/06/2023 NuStep lvl 5 LE only x 4 min for functional activity tolerance Supine QS x 10 - 5 hold Supine SLR 2x10 ea Bridge GTB 2x10 Hooklying clamshell 2x15 GTB STS L foot back 2x10 - no UE Seated knee ext 3x10 10# L only Functional squat B UE 2x10  Step up 6in fwd L leading 2x10 1 UE     PATIENT EDUCATION:  Education details: eval findings, LEFS, HEP, POC Person educated: Patient Education method: Explanation, Demonstration, and Handouts Education comprehension: verbalized understanding and returned demonstration  HOME EXERCISE PROGRAM: Access Code: [email or web address] URL: [email or web address] Date: 11/20/2023 Prepared by: Alm Kingdom  Exercises - Supine Quad Set  - 1 x daily - 7 x weekly - 2 sets - 10 reps - 5 sec hold - Active Straight Leg Raise with Quad Set (Mirrored)  - 1 x daily - 7 x weekly - 3 sets - 10 reps - Supine Heel Slide (Mirrored)  - 1 x daily - 7 x weekly - 3 sets - 10 reps - 3 sec hold - Sidelying Hip Abduction  - 1 x daily - 7 x weekly - 2-3 sets - 10 reps - Sit to  Stand Without Arm Support  - 1 x daily - 7 x weekly - 2-3 sets - 10 reps  ASSESSMENT:  CLINICAL IMPRESSION: Pt was able to complete all prescribed exercises with no adverse effect. Today we focused on continued progression of L proximal hip and thigh strengthening. She is making good progress as her L thigh is slowly improving motor control and strength.  She reports reduced pain to 5/10 or less at worst. STG# 2 met. 30 sec STS has also improved. Will continue to progress as able per POC. Trial of HMP for pain reduction -assess response next session.   EVAL: Patient is a 58 y.o. F who was seen today for physical therapy evaluation and treatment for continued L LE weakness s/p lumbar laminectomy performed on 09/22/23. Physical findings are consistent with surgery and recovery timeline as pt demonstrates decrease in L LE strength and general decrease in functional mobility. LEFS scores shows she is operating below PLOF for home ADLs and higher level mobility. Pt would benefit from skilled PT services working on improving LE strength and mobility post operatively in order to improve function and decrease L thigh pain.   OBJECTIVE IMPAIRMENTS: Abnormal gait, decreased activity tolerance, decreased balance, decreased endurance, decreased mobility, difficulty walking, decreased ROM, decreased strength, and pain   ACTIVITY LIMITATIONS: standing, squatting, stairs, transfers, and locomotion level  PARTICIPATION LIMITATIONS: meal prep, cleaning, driving, shopping, community activity, and yard work  PERSONAL FACTORS: Time since onset of injury/illness/exacerbation and 3+ comorbidities: L THA, Lumbar decompression, HTN are also affecting patient's functional outcome.   REHAB POTENTIAL: Excellent  CLINICAL DECISION MAKING: Stable/uncomplicated  EVALUATION COMPLEXITY: Low   GOALS: Goals reviewed with patient? No  Crosby TERM GOALS: Target date: 12/11/2023   Pt will be compliant and knowledgeable with  initial HEP for improved  comfort and carryover Baseline: initial HEP given  Goal status: MET  2.  Pt will self report L LE pain no greater than 5/10 for improved comfort and functional ability Baseline: 8/10 at worst 12/11/23: 7/10 now that I have started coughing  12/25/23: 5/10 at worst  Goal status: MET  LONG TERM GOALS: Target date: 01/15/2024   Pt will improve LEFS to no less than 52/80 as proxy for functional improvement with home ADLs and higher level community activity Baseline: 42/80 Goal status: INITIAL   2.  Pt will self report L LE pain no greater than 1-2/10 for improved comfort and functional ability Baseline: 8/10 at worst Goal status: INITIAL   3.  Pt will increase 30 Second Sit to Stand rep count to no less than 15 reps for improved balance, strength, and functional mobility Baseline: 11 reps  12/25/23: 13 reps  Goal status: ONGOING   4.  Pt will improve L hip flexion MMT to no less than 4/5 for improved functional mobility and decreased pain Baseline: 2+/5 Goal status: INITIAL  5.  Pt will improve L active hip flexion in supine to no less than 90 degrees for improved functional mobility Baseline: 45 degrees Goal status: INITIAL   PLAN:  PT FREQUENCY: 2x/week  PT DURATION: 8 weeks  PLANNED INTERVENTIONS: [id number]- PT Re-evaluation, 97110-Therapeutic exercises, 97530- Therapeutic activity, [id number]- Neuromuscular re-education, 97535- Self Care, [id number]- Manual therapy, [id number]- Gait training, [phone]- Electrical stimulation (unattended), [id number]- Electrical stimulation (manual), 20560 (1-2 muscles), [id number] (3+ muscles)- Dry Needling, Patient/Family education, Cryotherapy, and Moist heat  PLAN FOR NEXT SESSION: assess HEP response, LE strengthening with emphasis on L hip flexion   Harlene Persons, PTA 12/25/23 1:20 PM Phone: [phone] Fax: [phone]

## 2023-12-27 ENCOUNTER — Ambulatory Visit

## 2023-12-27 DIAGNOSIS — M25552 Pain in left hip: Secondary | ICD-10-CM | POA: Diagnosis not present

## 2023-12-27 DIAGNOSIS — M6281 Muscle weakness (generalized): Secondary | ICD-10-CM

## 2023-12-27 DIAGNOSIS — R2689 Other abnormalities of gait and mobility: Secondary | ICD-10-CM

## 2023-12-27 NOTE — Therapy (Signed)
 OUTPATIENT PHYSICAL THERAPY TREATMENT   Patient Name: Darlene Crosby MRN: [id number] DOB:[birth year], 58 y.o., female Today's Date: 12/28/2023  END OF SESSION:  PT End of Session - 12/27/23 1058     Visit Number 9    Number of Visits 17    Date for Recertification  01/15/24    Authorization Type [employer] Dual Complete    PT Start Time 1100    PT Stop Time 1130    PT Time Calculation (min) 30 min    Activity Tolerance Patient tolerated treatment well    Behavior During Therapy WFL for tasks assessed/performed               Past Medical History:  Diagnosis Date   Asthma    followed by pcp   Bronchitis    chronic   Hypertension    OA (osteoarthritis)    left knee, right shoulder, left hip   Pneumonia    a couple years ago per pt on 09/15/21   Seasonal allergies    Wears glasses    Past Surgical History:  Procedure Laterality Date   ANTERIOR HIP REVISION Left 06/06/2018   Procedure: LEFT HIP WOUND DEHISCENCE POSSIBLE HEAD AND LINER EXCHANGE;  Surgeon: Fidel Rogue, MD;  Location: [facility];  Service: Orthopedics;  Laterality: Left;   COLONOSCOPY  2015   DECOMPRESSIVE LUMBAR LAMINECTOMY LEVEL 2 N/A 09/22/2023   Procedure: DECOMPRESSIVE LUMBAR LAMINECTOMY LEVEL 2;  Surgeon: Georgina Ozell LABOR, MD;  Location: [facility];  Service: Orthopedics;  Laterality: N/A;  Lumbar 2 and Lumbar 3 segmental laminectomies with partial medial facetectomies   DIAGNOSTIC LAPAROSCOPY     tubal preg-took ovary and tube   LAPAROSCOPY FOR ECTOPIC PREGNANCY  1990s   LEFT SALPINGECTOMY AND RIGHT OOPHORECTOMY FOR ABNORMALITY   LUMBAR LAMINECTOMY N/A 09/20/2021   Procedure: L3-4 AND L4-5 CENTRAL LUMBAR LAMINECTOMIES;  Surgeon: Lucilla Lynwood BRAVO, MD;  Location: [facility];  Service: Orthopedics;  Laterality: N/A;   MASS EXCISION  02/15/2012   Procedure: EXCISION MASS;  Surgeon: Donnice LABOR Robinsons, MD;  Location: [facility];  Service: Orthopedics;  Laterality: Right;  Excision of Right Small  Volar Mass   MYELOGRAM     TONSILLECTOMY  age 17   TOTAL HIP ARTHROPLASTY Left 04/25/2018   Procedure: TOTAL HIP ARTHROPLASTY ANTERIOR APPROACH;  Surgeon: Fidel Rogue, MD;  Location: [facility];  Service: Orthopedics;  Laterality: Left;   TRIGGER FINGER RELEASE Right 2018   thumb   WISDOM TOOTH EXTRACTION  age 79   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 04/12/2022   Status post lumbar laminectomy 09/20/2021   Body mass index (BMI) 37.0-37.9, adult 03/14/2019   Lumbar stenosis without neurogenic claudication 03/14/2019   Hyperlipidemia 02/08/2019   Lumbar spondylosis 10/25/2018   Degeneration of lumbar intervertebral disc 10/01/2018   Surgical wound dehiscence 06/06/2018   Osteoarthritis of left hip 04/25/2018   Spinal stenosis of lumbar region 02/23/2018   Pain in left knee 01/10/2018   Trigger thumb of right hand 03/30/2017   Symptomatic mammary hypertrophy 11/28/2014   Dyspnea 03/26/2013   Asthma vs VCD  02/28/2013   Hypertensive disorder 06/07/2012   Pyogenic granuloma 02/15/2012    PCP: Lorren Greig PARAS, NP  REFERRING PROVIDER: Georgina Ozell LABOR, MD  REFERRING DIAG: R29.898 (ICD-10-CM) - Weakness of left hip   THERAPY DIAG:  Pain in left hip  Muscle  weakness (generalized)  Other abnormalities of gait and mobility  Rationale for Evaluation and Treatment: Rehabilitation  ONSET DATE: 09/22/2023  SUBJECTIVE:   SUBJECTIVE STATEMENT: Pt presents to PT with reports of continued L thigh pain at 5/10. Pt has been compliant with HEP.   EVAL: Pt presents to PT s/p decompressive lumbar laminectomy performed by Dr. Georgina on 09/22/23. Pt notes that pain is occasionally in L anterior thigh and is especially present when fatigued. Denies sensation change in L LE. Symptoms of pain have resolved really well post surgery. She is frustrated by her continued  inability to flex L hip in seated position.    PERTINENT HISTORY: L THA, Lumbar decompression, HTN  PAIN:  Are you having pain?  Yes: NPRS scale: 0/10 Worst: 8/10 Pain location: L anterior thigh Pain description: sore Aggravating factors: squats, stairs Relieving factors: heat  PRECAUTIONS: None  RED FLAGS: None   WEIGHT BEARING RESTRICTIONS: No  FALLS:  Has patient fallen in last 6 months? No  LIVING ENVIRONMENT: Lives with: lives with their family Lives in: House/apartment Stairs: 1 STE Has following equipment at home: Single point cane  PLOF: Independent  PATIENT GOALS: improve L LE strength, get back to being able to do line dancing without limitation   NEXT MD VISIT: 12/18/2023  OBJECTIVE:  Note: Objective measures were completed at Evaluation unless otherwise noted.  DIAGNOSTIC FINDINGS: N/A  PATIENT SURVEYS:  LEFS  Extreme difficulty/unable (0), Quite a bit of difficulty (1), Moderate difficulty (2), Little difficulty (3), No difficulty (4) Survey date:  11/20/2023  Any of your usual work, housework or school activities 2  2. Usual hobbies, recreational or sporting activities 3  3. Getting into/out of the bath 3  4. Walking between rooms 4  5. Putting on socks/shoes 3  6. Squatting  1  7. Lifting an object, like a bag of groceries from the floor 1  8. Performing light activities around your home 2  9. Performing heavy activities around your home 3  10. Getting into/out of a car 1  11. Walking 2 blocks 4  12. Walking 1 mile 4  13. Going up/down 10 stairs (1 flight) 3  14. Standing for 1 hour 3  15.  sitting for 1 hour 4  16. Running on even ground 0  17. Running on uneven ground 0  18. Making sharp turns while running fast 0  19. Hopping  1  20. Rolling over in bed 1  Score total:  42/80     COGNITION: Overall cognitive status: Within functional limits for tasks assessed     SENSATION: Light touch: Impaired   POSTURE: increased lumbar  lordosis and flexed trunk   PALPATION: Slight TTP to proximal L thigh  LOWER EXTREMITY ROM:  Active ROM Right eval Left eval  Hip flexion 110 45  Hip extension    Hip abduction    Hip adduction    Hip internal rotation    Hip external rotation    Knee flexion    Knee extension    Ankle dorsiflexion    Ankle plantarflexion    Ankle inversion    Ankle eversion     (Blank rows = not tested)  LOWER EXTREMITY MMT:  MMT Right eval Left eval  Hip flexion 4 2+  Hip extension    Hip abduction 4 4  Hip adduction    Hip internal rotation    Hip external rotation    Knee flexion 5 5  Knee extension 5 5  Ankle dorsiflexion    Ankle plantarflexion    Ankle inversion    Ankle eversion     (Blank rows = not tested)  LOWER EXTREMITY SPECIAL TESTS:  DNT  FUNCTIONAL TESTS:  30 Second Sit to Stand: 11 reps 12/25/23: sit to stand: 13 reps   GAIT: Distance walked: 25ft Assistive device utilized: None Level of assistance: Complete Independence Comments: antalgic gait L LE   TREATMENT: [facility] Adult PT Treatment:                                                DATE: 12/27/23 NuStep lvl 6 LE only x 5 minutes Standing hip abd 2x10 25# Standing hip flex 2x10 12.5# Cybex single leg press 3x8 40# L Supine SLR 3x10 L Supine heel slide 2x15 L  [facility] Adult PT Treatment:                                                DATE: 12/25/23 Rec Bike L2 x 5 minutes  Standing hip flexion 2# 10 x 2 each ( 1 set knee extended) Standing hp abdct 2# x 10 each  Standing hip ext 2@ x 10 each  Leg press 45# 20 x 2 Supine SLR 2x10 L Supine heel slide x 10 with slide board  Bridge with ball squeeze 3 x 10  Unable to supine march  30 sec STS 13 reps low mat without UE HMP to left thigh x 10 minutes   [facility] Adult PT Treatment:                                                DATE: 12/20/2023 NuStep lvl 5 LE only x 5 min for functional activity tolerance Standing march 2x10 2# L Single leg press  3x10 45# L Supine SLR 3x10 L Supine march - unable  Bridge with ball 3x10 STS L foot back 2x10 - no UE TKE with GTB 2x10 L Step up 8in L stance 2x10 fwd - no UE Wall squat 2x10   [facility] Adult PT Treatment:                                                DATE: 12/18/2023 NuStep lvl 5 LE only x 5 min for functional activity tolerance Seated march x 10 each  Supine SLR 2x10 L Bridge blue band 2x15 Bridge with ball squeeze 10 x 2  Hooklying clamshell 2x15 blue band STS L foot back 2x10 - no UE Standing march  Heel raises  LAQ 2x10 L 5# with ball squeeze  Leg press single 3x10 L only 35#   [facility] Adult PT Treatment:                                                DATE: 12/13/2023 NuStep lvl 5 LE only x  4 min for functional activity tolerance Supine QS x 10 - 5 hold Supine SLR 2x10 L Bridge blue band 2x15 Hooklying clamshell 2x15 blue band STS L foot back 2x10 - no UE LAQ 2x10 L 5# Leg press single 3x10 L only 35# Step up 8in fwd L leading x 10 1 UE  PATIENT EDUCATION:  Education details: eval findings, LEFS, HEP, POC Person educated: Patient Education method: Explanation, Demonstration, and Handouts Education comprehension: verbalized understanding and returned demonstration  HOME EXERCISE PROGRAM: Access Code: [email or web address] URL: [email or web address] Date: 11/20/2023 Prepared by: Alm Kingdom  Exercises - Supine Quad Set  - 1 x daily - 7 x weekly - 2 sets - 10 reps - 5 sec hold - Active Straight Leg Raise with Quad Set (Mirrored)  - 1 x daily - 7 x weekly - 3 sets - 10 reps - Supine Heel Slide (Mirrored)  - 1 x daily - 7 x weekly - 3 sets - 10 reps - 3 sec hold - Sidelying Hip Abduction  - 1 x daily - 7 x weekly - 2-3 sets - 10 reps - Sit to Stand Without Arm Support  - 1 x daily - 7 x weekly - 2-3 sets - 10 reps  ASSESSMENT:  CLINICAL IMPRESSION: Pt was able to complete all prescribed exercises with no adverse effect although session was ended early when she  started having significant cramping in L thigh. Overall she is progressing well with therapy, continues to show improving L thigh strength and improving moving mobility.   EVAL: Patient is a 58 y.o. F who was seen today for physical therapy evaluation and treatment for continued L LE weakness s/p lumbar laminectomy performed on 09/22/23. Physical findings are consistent with surgery and recovery timeline as pt demonstrates decrease in L LE strength and general decrease in functional mobility. LEFS scores shows she is operating below PLOF for home ADLs and higher level mobility. Pt would benefit from skilled PT services working on improving LE strength and mobility post operatively in order to improve function and decrease L thigh pain.   OBJECTIVE IMPAIRMENTS: Abnormal gait, decreased activity tolerance, decreased balance, decreased endurance, decreased mobility, difficulty walking, decreased ROM, decreased strength, and pain   ACTIVITY LIMITATIONS: standing, squatting, stairs, transfers, and locomotion level  PARTICIPATION LIMITATIONS: meal prep, cleaning, driving, shopping, community activity, and yard work  PERSONAL FACTORS: Time since onset of injury/illness/exacerbation and 3+ comorbidities: L THA, Lumbar decompression, HTN are also affecting patient's functional outcome.   REHAB POTENTIAL: Excellent  CLINICAL DECISION MAKING: Stable/uncomplicated  EVALUATION COMPLEXITY: Low   GOALS: Goals reviewed with patient? No  SHORT TERM GOALS: Target date: 12/11/2023   Pt will be compliant and knowledgeable with initial HEP for improved comfort and carryover Baseline: initial HEP given  Goal status: MET  2.  Pt will self report L LE pain no greater than 5/10 for improved comfort and functional ability Baseline: 8/10 at worst 12/11/23: 7/10 now that I have started coughing  12/25/23: 5/10 at worst  Goal status: MET  LONG TERM GOALS: Target date: 01/15/2024   Pt will improve LEFS to no  less than 52/80 as proxy for functional improvement with home ADLs and higher level community activity Baseline: 42/80 Goal status: INITIAL   2.  Pt will self report L LE pain no greater than 1-2/10 for improved comfort and functional ability Baseline: 8/10 at worst Goal status: INITIAL   3.  Pt will increase 30 Second Sit to Stand rep count to no less  than 15 reps for improved balance, strength, and functional mobility Baseline: 11 reps  12/25/23: 13 reps  Goal status: ONGOING   4.  Pt will improve L hip flexion MMT to no less than 4/5 for improved functional mobility and decreased pain Baseline: 2+/5 Goal status: INITIAL  5.  Pt will improve L active hip flexion in supine to no less than 90 degrees for improved functional mobility Baseline: 45 degrees Goal status: INITIAL   PLAN:  PT FREQUENCY: 2x/week  PT DURATION: 8 weeks  PLANNED INTERVENTIONS: [id number]- PT Re-evaluation, 97110-Therapeutic exercises, 97530- Therapeutic activity, [id number]- Neuromuscular re-education, 97535- Self Care, [id number]- Manual therapy, [id number]- Gait training, [phone]- Electrical stimulation (unattended), [id number]- Electrical stimulation (manual), 20560 (1-2 muscles), [id number] (3+ muscles)- Dry Needling, Patient/Family education, Cryotherapy, and Moist heat  PLAN FOR NEXT SESSION: assess HEP response, LE strengthening with emphasis on L hip flexion   Alm JAYSON Kingdom PT  12/28/23 8:52 AM

## 2024-01-01 ENCOUNTER — Encounter: Payer: Self-pay | Admitting: Radiology

## 2024-01-02 NOTE — Therapy (Signed)
 OUTPATIENT OCCUPATIONAL THERAPY ORTHO EVALUATION  Patient Name: Darlene Crosby MRN: [id number] DOB:1966-02-21, 58 y.o., female Today's Date: 01/04/2024  PCP: Jaycee Greig PARAS, NP  REFERRING PROVIDER: Arlinda Buster, MD   END OF SESSION:  OT End of Session - 01/04/24 1203     Visit Number 1    Number of Visits 16    Date for Recertification  03/04/24    Authorization Type [employer] Dual complete - covered 100%    Progress Note Due on Visit 10    OT Start Time 1015    OT Stop Time 1145    OT Time Calculation (min) 90 min    Activity Tolerance Patient tolerated treatment well    Behavior During Therapy WFL for tasks assessed/performed          Past Medical History:  Diagnosis Date   Asthma    followed by pcp   Bronchitis    chronic   Hypertension    OA (osteoarthritis)    left knee, right shoulder, left hip   Pneumonia    a couple years ago per pt on 09/15/21   Seasonal allergies    Wears glasses    Past Surgical History:  Procedure Laterality Date   ANTERIOR HIP REVISION Left 06/06/2018   Procedure: LEFT HIP WOUND DEHISCENCE POSSIBLE HEAD AND LINER EXCHANGE;  Surgeon: Fidel Rogue, MD;  Location: [facility];  Service: Orthopedics;  Laterality: Left;   COLONOSCOPY  2015   DECOMPRESSIVE LUMBAR LAMINECTOMY LEVEL 2 N/A 09/22/2023   Procedure: DECOMPRESSIVE LUMBAR LAMINECTOMY LEVEL 2;  Surgeon: Georgina Ozell LABOR, MD;  Location: [facility];  Service: Orthopedics;  Laterality: N/A;  Lumbar 2 and Lumbar 3 segmental laminectomies with partial medial facetectomies   DIAGNOSTIC LAPAROSCOPY     tubal preg-took ovary and tube   LAPAROSCOPY FOR ECTOPIC PREGNANCY  1990s   LEFT SALPINGECTOMY AND RIGHT OOPHORECTOMY FOR ABNORMALITY   LUMBAR LAMINECTOMY N/A 09/20/2021   Procedure: L3-4 AND L4-5 CENTRAL LUMBAR LAMINECTOMIES;  Surgeon: Lucilla Lynwood BRAVO, MD;  Location: [facility];  Service: Orthopedics;  Laterality: N/A;   MASS EXCISION  02/15/2012   Procedure: EXCISION MASS;  Surgeon: Donnice LABOR Robinsons, MD;  Location: [facility];  Service: Orthopedics;  Laterality: Right;  Excision of Right Small Volar Mass   MYELOGRAM     TONSILLECTOMY  age 67   TOTAL HIP ARTHROPLASTY Left 04/25/2018   Procedure: TOTAL HIP ARTHROPLASTY ANTERIOR APPROACH;  Surgeon: Fidel Rogue, MD;  Location: [facility];  Service: Orthopedics;  Laterality: Left;   TRIGGER FINGER RELEASE Right 2018   thumb   WISDOM TOOTH EXTRACTION  age 66   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 04/12/2022   Status post lumbar laminectomy 09/20/2021   Body mass index (BMI) 37.0-37.9, adult 03/14/2019   Lumbar stenosis without neurogenic claudication 03/14/2019   Hyperlipidemia 02/08/2019   Lumbar spondylosis 10/25/2018   Degeneration of lumbar intervertebral disc 10/01/2018   Surgical wound dehiscence 06/06/2018   Osteoarthritis of left hip 04/25/2018   Spinal stenosis of lumbar region 02/23/2018   Pain in left knee 01/10/2018   Trigger thumb of right hand 03/30/2017   Symptomatic mammary hypertrophy 11/28/2014   Dyspnea 03/26/2013   Asthma vs VCD  02/28/2013   Hypertensive disorder 06/07/2012   Pyogenic granuloma 02/15/2012    ONSET DATE: 11/20/23 referral date (surgery 12/07/23)   REFERRING DIAG: M25.541 (ICD-10-CM) -  Arthralgia of metacarpophalangeal joint, right   Note: Needs OT 2 weeks s/p right long finger MCP arthroplasty  SPLINT  Imaging: XR Hand Complete Right Result Date: 12/21/2023 Stable appearance of the long finger MCP region status post prior resection arthroplasty, appropriate spacing is notable between the metacarpal head and proximal phalanx base  THERAPY DIAG:  Pain in right hand  Stiffness of right hand, not elsewhere classified  Localized edema  Other disturbances of skin sensation  Other lack of coordination  Muscle weakness  (generalized)  Rationale for Evaluation and Treatment: Rehabilitation  SUBJECTIVE:   SUBJECTIVE STATEMENT: I just came from the MD office. The finger has been bothering me for a long time d/t OA Pt accompanied by: self  PERTINENT HISTORY: Lt THA 2020, decompression of lumbar spline 08/2023 (L2-3)   PRECAUTIONS: Other: per protocol Rt hand, no lifting over 20 lbs d/t back    WEIGHT BEARING RESTRICTIONS: NWB through Rt hand  PAIN:  Are you having pain? No, just stiffness Rt hand  FALLS: Has patient fallen in last 6 months? No  LIVING ENVIRONMENT: Lives with: lives with 47 y.o. grandson Lives in: 1 story home, 1 step to enter Has following equipment at home: Single point cane, Environmental Consultant - 2 wheeled, and bed side commode  PLOF: Independent and on disability d/t hip   PATIENT GOALS: Get use of my Rt hand back  NEXT MD VISIT: 02/01/24  OBJECTIVE:  Note: Objective measures were completed at Evaluation unless otherwise noted.  HAND DOMINANCE: Right  ADLs: Overall ADLs: Mod I for BADLS and IADLS w/ Lt hand and difficulty  FUNCTIONAL OUTCOME MEASURES: Quick Dash: TBD  UPPER EXTREMITY ROM:   BUE AROM WNLs at shoulders, elbows, and forearms  Active ROM Right eval Left eval  Shoulder flexion    Shoulder abduction    Shoulder adduction    Shoulder extension    Shoulder internal rotation    Shoulder external rotation    Elbow flexion    Elbow extension    Wrist flexion 45   Wrist extension 35   Wrist ulnar deviation    Wrist radial deviation    Wrist pronation    Wrist supination    (Blank rows = not tested)  Active ROM Right eval Left eval  Thumb MCP (0-60)    Thumb IP (0-80)    Thumb Radial abd/add (0-55)     Thumb Palmar abd/add (0-45)     Thumb Opposition to Small Finger     Index MCP (0-90)     Index PIP (0-100)     Index DIP (0-70)      Long MCP (0-90) 54*, -15*     Long PIP (0-100) 75*, 0*     Long DIP (0-70)      Ring MCP (0-90)      Ring PIP  (0-100)      Ring DIP (0-70)      Little MCP (0-90)      Little PIP (0-100)      Little DIP (0-70)      (Blank rows = not tested)   UPPER EXTREMITY MMT:   NT   HAND FUNCTION: Grip strength TBD once precautions lifted  COORDINATION: Impaired d/t edema and decreased ROM   SENSATION: Not formally assessed  EDEMA: moderate dorsal hand and in fingers (long finger worse)  COGNITION: Overall cognitive status: Within functional limits for tasks assessed  OBSERVATIONS: Pt came directly from MD office w/ gauze dressing wrapped around hand.  TREATMENT DATE: 01/04/24                                                                                                                            Carefully removed gauze dressing prior to splint fabrication.   MD wants clam shell splint including 4 fingers just to PIP level keeping PIP's free (clarified via Teams). Fabricated and fitted forearm based clam shell splint w/ wrist in slightly extension and MP's in as much extension as able to achieve (limited d/t edema and decreased full MP extension of Rt long finger) allowing some PIP movement. Issued splint and reviewed wearing schedule - pt may remove for exercises, to shower, and if just sitting watching TV.  Pt also demo donning and doffing clam shell splint. (Therapist called and left voicemail after appointment to review care of splint - keep away from heat, clean w/ rubbing alcohol  1-2 times per day, don't leave in car, keep away from pets).   Pt also issued initial A/ROM HEP - see pt instructions for details. Pt tolerated each well with stiffness MP joints (especially Rt long finger). Pt also instructed to keep shoulder, elbow, FA, and thumb moving as well.   Pt issued regular stockinette (3 provided) and 2 compressive stockinette. Pt educated in wear and care of these   PATIENT EDUCATION: Education details: see above Person educated: Patient Education method: Programmer, Multimedia, Demonstration,  Verbal cues, and Handouts Education comprehension: verbalized understanding, returned demonstration, and verbal cues required  HOME EXERCISE PROGRAM: 01/04/24: A/ROM HEP for Rt hand/wrist  GOALS: Goals reviewed with patient? Yes  SHORT TERM GOALS: Target date: 02/03/24  Independent with splint wear and care Baseline: Goal status: IN PROGRESS   2.  Independent with ROM HEP  Baseline:  Goal status: IN PROGRESS - pt will need updates to include PROM  3.  Pt to verbalize understanding of edema management strategies  Baseline:  Goal status: INITIAL  4.  Pt to improve in Rt long finger MP flexion and PIP flexion by 10* or more Baseline: 54*, 75*  Goal status: INITIAL  5.  Pt to improve Rt long finger MP extension to -5*  Baseline: -15* Goal status: INITIAL   LONG TERM GOALS: Target date: 03/04/24  Independent with updated strengthening HEP  Baseline:  Goal status: INITIAL  2.  Pt to demo ROM Rt hand WFL's to make fist for full grasp/release Baseline:  Goal status: INITIAL  3.  Pt to return to using Rt hand as dominant hand for ADLS and bilateral tasks Baseline:  Goal status: INITIAL  4.  Pt to demo 50% or greater Rt grip strength compared to Lt hand Baseline:  Goal status: INITIAL  5.  Quick Dash TBD Baseline:  Goal status: INITIAL   ASSESSMENT:  CLINICAL IMPRESSION: Patient is a 58 y.o. female who was seen today for occupational therapy evaluation for s/p Rt long finger MCP arthroplasty on 12/07/23? Due to OA. Hx includes lumbar decompression and laminectomy (  08/2023) and Lt THA in 2020 (currently seeing ortho P.T.). Patient currently presents below baseline level of functioning demonstrating functional deficits and impairments as noted below. Pt would benefit from skilled OT services in the outpatient setting to work on impairments as noted below to help pt return to PLOF as able.   SABRA   PERFORMANCE DEFICITS: in functional skills including ADLs, IADLs, coordination,  dexterity, sensation, edema, ROM, strength, pain, fascial restrictions, Fine motor control, Gross motor control, decreased knowledge of precautions, and UE functional use.   IMPAIRMENTS: are limiting patient from ADLs, IADLs, leisure, and social participation.   COMORBIDITIES: may have co-morbidities  that affects occupational performance. Patient will benefit from skilled OT to address above impairments and improve overall function.  MODIFICATION OR ASSISTANCE TO COMPLETE EVALUATION: Min-Moderate modification of tasks or assist with assess necessary to complete an evaluation.  OT OCCUPATIONAL PROFILE AND HISTORY: Detailed assessment: Review of records and additional review of physical, cognitive, psychosocial history related to current functional performance.  CLINICAL DECISION MAKING: Moderate - several treatment options, min-mod task modification necessary  REHAB POTENTIAL: Good  EVALUATION COMPLEXITY: Moderate      PLAN:  OT FREQUENCY: 1-2x/week (may begin at 1x/wk then increase to 2x/wk)  OT DURATION: 8 weeks  PLANNED INTERVENTIONS: 97535 self care/ADL training, 02889 therapeutic exercise, 97530 therapeutic activity, 97140 manual therapy, 97035 ultrasound, 97018 paraffin, 02960 fluidotherapy, 97010 moist heat, 97010 cryotherapy, 97034 contrast bath, 97760 Orthotic Initial, 97763 Orthotic/Prosthetic subsequent, scar mobilization, passive range of motion, compression bandaging, patient/family education, and DME and/or AE instructions  RECOMMENDED OTHER SERVICES: none at this time  CONSULTED AND AGREED WITH PLAN OF CARE: Patient  PLAN FOR NEXT SESSION: assess Quick Dash and update LTG #5, splint adjustments prn, review A/ROM HEP and add gentle PROM    Burnard JINNY Roads, OT 01/04/2024, 12:04 PM

## 2024-01-03 ENCOUNTER — Ambulatory Visit: Attending: Family

## 2024-01-03 DIAGNOSIS — R208 Other disturbances of skin sensation: Secondary | ICD-10-CM | POA: Insufficient documentation

## 2024-01-03 DIAGNOSIS — M6281 Muscle weakness (generalized): Secondary | ICD-10-CM | POA: Insufficient documentation

## 2024-01-03 DIAGNOSIS — R278 Other lack of coordination: Secondary | ICD-10-CM | POA: Insufficient documentation

## 2024-01-03 DIAGNOSIS — M25552 Pain in left hip: Secondary | ICD-10-CM | POA: Insufficient documentation

## 2024-01-03 DIAGNOSIS — R262 Difficulty in walking, not elsewhere classified: Secondary | ICD-10-CM | POA: Insufficient documentation

## 2024-01-03 DIAGNOSIS — M79641 Pain in right hand: Secondary | ICD-10-CM | POA: Insufficient documentation

## 2024-01-03 DIAGNOSIS — R2689 Other abnormalities of gait and mobility: Secondary | ICD-10-CM | POA: Insufficient documentation

## 2024-01-03 DIAGNOSIS — M25641 Stiffness of right hand, not elsewhere classified: Secondary | ICD-10-CM | POA: Insufficient documentation

## 2024-01-03 DIAGNOSIS — R600 Localized edema: Secondary | ICD-10-CM | POA: Insufficient documentation

## 2024-01-03 NOTE — Therapy (Addendum)
 OUTPATIENT PHYSICAL THERAPY TREATMENT/PROGRESS NOTE   Patient Name: Darlene Crosby MRN: [id number] DOB:October 15, 1965, 58 y.o., female Today's Date: 01/10/2024  END OF SESSION:  PT End of Session - 01/03/24 1059     Visit Number 10    Number of Visits 17    Date for Recertification  02/05/24    Authorization Type [employer] Dual Complete    PT Start Time 1100    PT End Time 1140   Activity Tolerance Patient tolerated treatment well    Behavior During Therapy WFL for tasks assessed/performed                Past Medical History:  Diagnosis Date   Asthma    followed by pcp   Bronchitis    chronic   Hypertension    OA (osteoarthritis)    left knee, right shoulder, left hip   Pneumonia    a couple years ago per pt on 09/15/21   Seasonal allergies    Wears glasses    Past Surgical History:  Procedure Laterality Date   ANTERIOR HIP REVISION Left 06/06/2018   Procedure: LEFT HIP WOUND DEHISCENCE POSSIBLE HEAD AND LINER EXCHANGE;  Surgeon: Fidel Rogue, MD;  Location: [facility];  Service: Orthopedics;  Laterality: Left;   COLONOSCOPY  2015   DECOMPRESSIVE LUMBAR LAMINECTOMY LEVEL 2 N/A 09/22/2023   Procedure: DECOMPRESSIVE LUMBAR LAMINECTOMY LEVEL 2;  Surgeon: Georgina Ozell LABOR, MD;  Location: [facility];  Service: Orthopedics;  Laterality: N/A;  Lumbar 2 and Lumbar 3 segmental laminectomies with partial medial facetectomies   DIAGNOSTIC LAPAROSCOPY     tubal preg-took ovary and tube   LAPAROSCOPY FOR ECTOPIC PREGNANCY  1990s   LEFT SALPINGECTOMY AND RIGHT OOPHORECTOMY FOR ABNORMALITY   LUMBAR LAMINECTOMY N/A 09/20/2021   Procedure: L3-4 AND L4-5 CENTRAL LUMBAR LAMINECTOMIES;  Surgeon: Lucilla Lynwood BRAVO, MD;  Location: [facility];  Service: Orthopedics;  Laterality: N/A;   MASS EXCISION  02/15/2012   Procedure: EXCISION MASS;  Surgeon: Donnice LABOR Robinsons, MD;  Location: [facility];  Service: Orthopedics;  Laterality: Right;  Excision of Right Small Volar Mass   MYELOGRAM      TONSILLECTOMY  age 89   TOTAL HIP ARTHROPLASTY Left 04/25/2018   Procedure: TOTAL HIP ARTHROPLASTY ANTERIOR APPROACH;  Surgeon: Fidel Rogue, MD;  Location: [facility];  Service: Orthopedics;  Laterality: Left;   TRIGGER FINGER RELEASE Right 2018   thumb   WISDOM TOOTH EXTRACTION  age 51   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 04/12/2022   Status post lumbar laminectomy 09/20/2021   Body mass index (BMI) 37.0-37.9, adult 03/14/2019   Lumbar stenosis without neurogenic claudication 03/14/2019   Hyperlipidemia 02/08/2019   Lumbar spondylosis 10/25/2018   Degeneration of lumbar intervertebral disc 10/01/2018   Surgical wound dehiscence 06/06/2018   Osteoarthritis of left hip 04/25/2018   Spinal stenosis of lumbar region 02/23/2018   Pain in left knee 01/10/2018   Trigger thumb of right hand 03/30/2017   Symptomatic mammary hypertrophy 11/28/2014   Dyspnea 03/26/2013   Asthma vs VCD  02/28/2013   Hypertensive disorder 06/07/2012   Pyogenic granuloma 02/15/2012    PCP: Lorren Greig PARAS, NP  REFERRING PROVIDER: Georgina Ozell LABOR, MD  REFERRING DIAG: R29.898 (ICD-10-CM) - Weakness of left hip   THERAPY DIAG:  Pain in left hip  Muscle weakness (generalized)  Other abnormalities of gait and  mobility  Rationale for Evaluation and Treatment: Rehabilitation  ONSET DATE: 09/22/2023  SUBJECTIVE:   SUBJECTIVE STATEMENT: Pt presents to PT with slight 4/10 L thigh pain. Has been compliant with HEP.   EVAL: Pt presents to PT s/p decompressive lumbar laminectomy performed by Dr. Georgina on 09/22/23. Pt notes that pain is occasionally in L anterior thigh and is especially present when fatigued. Denies sensation change in L LE. Symptoms of pain have resolved really well post surgery. She is frustrated by her continued inability to flex L hip in seated position.     PERTINENT HISTORY: L THA, Lumbar decompression, HTN  PAIN:  Are you having pain?  Yes: NPRS scale: 0/10 Worst: 8/10 Pain location: L anterior thigh Pain description: sore Aggravating factors: squats, stairs Relieving factors: heat  PRECAUTIONS: None  RED FLAGS: None   WEIGHT BEARING RESTRICTIONS: No  FALLS:  Has patient fallen in last 6 months? No  LIVING ENVIRONMENT: Lives with: lives with their family Lives in: House/apartment Stairs: 1 STE Has following equipment at home: Single point cane  PLOF: Independent  PATIENT GOALS: improve L LE strength, get back to being able to do line dancing without limitation   NEXT MD VISIT: 12/18/2023  OBJECTIVE:  Note: Objective measures were completed at Evaluation unless otherwise noted.  DIAGNOSTIC FINDINGS: N/A  PATIENT SURVEYS:  LEFS  Extreme difficulty/unable (0), Quite a bit of difficulty (1), Moderate difficulty (2), Little difficulty (3), No difficulty (4) Survey date:  11/20/2023 01/03/2024  Any of your usual work, housework or school activities 2   2. Usual hobbies, recreational or sporting activities 3   3. Getting into/out of the bath 3   4. Walking between rooms 4   5. Putting on socks/shoes 3   6. Squatting  1   7. Lifting an object, like a bag of groceries from the floor 1   8. Performing light activities around your home 2   9. Performing heavy activities around your home 3   10. Getting into/out of a car 1   11. Walking 2 blocks 4   12. Walking 1 mile 4   13. Going up/down 10 stairs (1 flight) 3   14. Standing for 1 hour 3   15.  sitting for 1 hour 4   16. Running on even ground 0   17. Running on uneven ground 0   18. Making sharp turns while running fast 0   19. Hopping  1   20. Rolling over in bed 1   Score total:  42/80 45/80     COGNITION: Overall cognitive status: Within functional limits for tasks assessed     SENSATION: Light touch: Impaired   POSTURE: increased lumbar lordosis and  flexed trunk   PALPATION: Slight TTP to proximal L thigh  LOWER EXTREMITY ROM:  Active ROM Right eval Left eval  Hip flexion 110 45  Hip extension    Hip abduction    Hip adduction    Hip internal rotation    Hip external rotation    Knee flexion    Knee extension    Ankle dorsiflexion    Ankle plantarflexion    Ankle inversion    Ankle eversion     (Blank rows = not tested)  LOWER EXTREMITY MMT:  MMT Right eval Left eval Left 01/03/2024  Hip flexion 4 2+ 3  Hip extension     Hip abduction 4 4   Hip adduction     Hip internal rotation  Hip external rotation     Knee flexion 5 5   Knee extension 5 5   Ankle dorsiflexion     Ankle plantarflexion     Ankle inversion     Ankle eversion      (Blank rows = not tested)  LOWER EXTREMITY SPECIAL TESTS:  DNT  FUNCTIONAL TESTS:  30 Second Sit to Stand: 11 reps 12/25/23: sit to stand: 13 reps   GAIT: Distance walked: 44ft Assistive device utilized: None Level of assistance: Complete Independence Comments: antalgic gait L LE   TREATMENT: [facility] Adult PT Treatment:                                                DATE: 01/03/24 NuStep lvl 6 LE only x 5 minutes Assessment of tests/measures, goals, and outcomes for progress note Supine SLR 3x10 L Bridge with ball 2x15 Hooklying clamshell 3x15 blue band STS 3x10 L back low table no UE Cybex single leg press 3x10 40# L  Standing hip abd 2x10 25# Standing hip flex 2x10 12.5# Cybex single leg press 3x8 40# L Supine SLR 3x10 L Supine heel slide 2x15 L  [facility] Adult PT Treatment:                                                DATE: 12/27/23 NuStep lvl 6 LE only x 5 minutes Standing hip abd 2x10 25# Standing hip flex 2x10 12.5# Cybex single leg press 3x8 40# L Supine SLR 3x10 L Supine heel slide 2x15 L  [facility] Adult PT Treatment:                                                DATE: 12/25/23 Rec Bike L2 x 5 minutes  Standing hip flexion 2# 10 x 2 each ( 1 set knee  extended) Standing hp abdct 2# x 10 each  Standing hip ext 2@ x 10 each  Leg press 45# 20 x 2 Supine SLR 2x10 L Supine heel slide x 10 with slide board  Bridge with ball squeeze 3 x 10  Unable to supine march  30 sec STS 13 reps low mat without UE HMP to left thigh x 10 minutes   [facility] Adult PT Treatment:                                                DATE: 12/20/2023 NuStep lvl 5 LE only x 5 min for functional activity tolerance Standing march 2x10 2# L Single leg press 3x10 45# L Supine SLR 3x10 L Supine march - unable  Bridge with ball 3x10 STS L foot back 2x10 - no UE TKE with GTB 2x10 L Step up 8in L stance 2x10 fwd - no UE Wall squat 2x10   [facility] Adult PT Treatment:  DATE: 12/18/2023 NuStep lvl 5 LE only x 5 min for functional activity tolerance Seated march x 10 each  Supine SLR 2x10 L Bridge blue band 2x15 Bridge with ball squeeze 10 x 2  Hooklying clamshell 2x15 blue band STS L foot back 2x10 - no UE Standing march  Heel raises  LAQ 2x10 L 5# with ball squeeze  Leg press single 3x10 L only 35#   [facility] Adult PT Treatment:                                                DATE: 12/13/2023 NuStep lvl 5 LE only x 4 min for functional activity tolerance Supine QS x 10 - 5 hold Supine SLR 2x10 L Bridge blue band 2x15 Hooklying clamshell 2x15 blue band STS L foot back 2x10 - no UE LAQ 2x10 L 5# Leg press single 3x10 L only 35# Step up 8in fwd L leading x 10 1 UE  PATIENT EDUCATION:  Education details: eval findings, LEFS, HEP, POC Person educated: Patient Education method: Explanation, Demonstration, and Handouts Education comprehension: verbalized understanding and returned demonstration  HOME EXERCISE PROGRAM: Access Code: [email or web address] URL: [email or web address] Date: 11/20/2023 Prepared by: Alm Kingdom  Exercises - Supine Quad Set  - 1 x daily - 7 x weekly - 2 sets - 10 reps - 5 sec hold - Active  Straight Leg Raise with Quad Set (Mirrored)  - 1 x daily - 7 x weekly - 3 sets - 10 reps - Supine Heel Slide (Mirrored)  - 1 x daily - 7 x weekly - 3 sets - 10 reps - 3 sec hold - Sidelying Hip Abduction  - 1 x daily - 7 x weekly - 2-3 sets - 10 reps - Sit to Stand Without Arm Support  - 1 x daily - 7 x weekly - 2-3 sets - 10 reps  ASSESSMENT:  CLINICAL IMPRESSION: Pt was able to complete all prescribed exercsies with no adverse effect. She is making good progress towards LTGs and shows improving L LE strength and functional mobility. Slight increase in subjective function assessed via LEFS. Pt continues to benefit form skilled PT, will continue to progress as able.   EVAL: Patient is a 58 y.o. F who was seen today for physical therapy evaluation and treatment for continued L LE weakness s/p lumbar laminectomy performed on 09/22/23. Physical findings are consistent with surgery and recovery timeline as pt demonstrates decrease in L LE strength and general decrease in functional mobility. LEFS scores shows she is operating below PLOF for home ADLs and higher level mobility. Pt would benefit from skilled PT services working on improving LE strength and mobility post operatively in order to improve function and decrease L thigh pain.   OBJECTIVE IMPAIRMENTS: Abnormal gait, decreased activity tolerance, decreased balance, decreased endurance, decreased mobility, difficulty walking, decreased ROM, decreased strength, and pain   ACTIVITY LIMITATIONS: standing, squatting, stairs, transfers, and locomotion level  PARTICIPATION LIMITATIONS: meal prep, cleaning, driving, shopping, community activity, and yard work  PERSONAL FACTORS: Time since onset of injury/illness/exacerbation and 3+ comorbidities: L THA, Lumbar decompression, HTN are also affecting patient's functional outcome.   REHAB POTENTIAL: Excellent  CLINICAL DECISION MAKING: Stable/uncomplicated  EVALUATION COMPLEXITY: Low   GOALS: Goals  reviewed with patient? No  SHORT TERM GOALS: Target date: 12/11/2023   Pt will be compliant and knowledgeable with initial  HEP for improved comfort and carryover Baseline: initial HEP given  Goal status: MET  2.  Pt will self report L LE pain no greater than 5/10 for improved comfort and functional ability Baseline: 8/10 at worst 12/11/23: 7/10 now that I have started coughing  12/25/23: 5/10 at worst  Goal status: MET  LONG TERM GOALS: Target date: 02/05/2024   Pt will improve LEFS to no less than 52/80 as proxy for functional improvement with home ADLs and higher level community activity Baseline: 42/80 01/03/2024: 45/80 Goal status: IN PROGRESS   2.  Pt will self report L LE pain no greater than 1-2/10 for improved comfort and functional ability Baseline: 8/10 at worst 01/03/2024: 7/10 at worst Goal status: IN PROGRESS   3.  Pt will increase 30 Second Sit to Stand rep count to no less than 15 reps for improved balance, strength, and functional mobility Baseline: 11 reps  12/25/23: 13 reps  01/03/2024: 13 reps Goal status: ONGOING   4.  Pt will improve L hip flexion MMT to no less than 4/5 for improved functional mobility and decreased pain Baseline: 2+/5 01/03/2024: 3/5 Goal status: ING PROGRESS  5.  Pt will improve L active hip flexion in supine to no less than 90 degrees for improved functional mobility Baseline: 45 degrees Goal status: IN PROGRESS   PLAN:  PT FREQUENCY: 2x/week  PT DURATION: 8 weeks  PLANNED INTERVENTIONS: 97164- PT Re-evaluation, 97110-Therapeutic exercises, 97530- Therapeutic activity, [id number]- Neuromuscular re-education, 97535- Self Care, [id number]- Manual therapy, [id number]- Gait training, [phone]- Electrical stimulation (unattended), [id number]- Electrical stimulation (manual), 20560 (1-2 muscles), 20561 (3+ muscles)- Dry Needling, Patient/Family education, Cryotherapy, and Moist heat  PLAN FOR NEXT SESSION: assess HEP response, LE strengthening with emphasis  on L hip flexion   Alm JAYSON Kingdom PT  01/10/24 8:42 AM

## 2024-01-04 ENCOUNTER — Ambulatory Visit: Admitting: Occupational Therapy

## 2024-01-04 ENCOUNTER — Ambulatory Visit

## 2024-01-04 ENCOUNTER — Ambulatory Visit: Admitting: Orthopedic Surgery

## 2024-01-04 DIAGNOSIS — M6281 Muscle weakness (generalized): Secondary | ICD-10-CM

## 2024-01-04 DIAGNOSIS — M25541 Pain in joints of right hand: Secondary | ICD-10-CM | POA: Diagnosis not present

## 2024-01-04 DIAGNOSIS — R208 Other disturbances of skin sensation: Secondary | ICD-10-CM

## 2024-01-04 DIAGNOSIS — M25552 Pain in left hip: Secondary | ICD-10-CM | POA: Diagnosis not present

## 2024-01-04 DIAGNOSIS — R600 Localized edema: Secondary | ICD-10-CM

## 2024-01-04 DIAGNOSIS — R278 Other lack of coordination: Secondary | ICD-10-CM

## 2024-01-04 DIAGNOSIS — M25641 Stiffness of right hand, not elsewhere classified: Secondary | ICD-10-CM

## 2024-01-04 DIAGNOSIS — M79641 Pain in right hand: Secondary | ICD-10-CM

## 2024-01-04 NOTE — Progress Notes (Signed)
   Darlene Crosby - 58 y.o. female MRN [id number]  Date of birth: June 07, 1965  Office Visit Note: Visit Date: 01/04/2024 PCP: Jaycee Greig PARAS, NP Referred by: Jaycee Greig PARAS, NP  Subjective:  HPI: Darlene Crosby is a 58 y.o. female who presents today for follow up 4 weeks status post right long finger metacarpophalangeal arthroplasty.  Doing well overall, pain is controlled.  Pertinent ROS were reviewed with the patient and found to be negative unless otherwise specified above in HPI.   Assessment & Plan: Visit Diagnoses:  1. Arthralgia of metacarpophalangeal joint, right     Plan: Cast removed today.  She will be seen by occupational therapy for fabrication of an orthosis and to begin gentle range of motion exercises.  Refrain from weightbearing at this time.  Follow-up myself in approximate 4 weeks.  Follow-up: No follow-ups on file.   Meds & Orders: No orders of the defined types were placed in this encounter.   Orders Placed This Encounter  Procedures   XR Hand Complete Right     Procedures: No procedures performed       Objective:   Vital Signs: LMP 05/04/2012   Ortho Exam Right hand well-healing dorsal incision, no erythema or drainage, digital range of motion is preserved, limited secondary to prolonged mobilization within cast  Imaging: XR Hand Complete Right Result Date: 01/04/2024 X-rays of the right hand demonstrate prior resection of the long finger metacarpal head, MCP remains stable in all planes    Prakash Kimberling Estela) Haddy Mullinax, M.D. [facility], Hand Surgery

## 2024-01-04 NOTE — Patient Instructions (Signed)
 AROM: Wrist Extension    With right palm down, bend wrist up. Repeat __10__ times per set. Do _4___ sets per day.    Flexor Tendon Gliding (Active Hook Fist)   With fingers and knuckles straight, bend middle and tip joints. Do not bend large knuckles. Repeat _10-15___ times. Do _4-6___ sessions per day.  MP Flexion (Active)   Bend large knuckles as far as they will go, keeping small joints straight. Repeat _10-15___ times. Do __4-6__ sessions per day.      Finger Flexion / Extension   Bend fingers of left hand toward palm, making a  fist. Straighten fingers, opening fist. Repeat sequence _10-15___ times per session. Do _4-6__ sessions per day.   Arm raises to front and to side  Bend elbow up and down Turn palm up and down  Thumb circles Touch thumb to each finger tip Make 4 and 5 w/ thumb

## 2024-01-05 ENCOUNTER — Encounter: Payer: Self-pay | Admitting: Family

## 2024-01-05 ENCOUNTER — Ambulatory Visit (INDEPENDENT_AMBULATORY_CARE_PROVIDER_SITE_OTHER): Admitting: Family

## 2024-01-05 VITALS — BP 133/80 | HR 87 | Temp 97.7°F | Resp 16 | Ht 65.0 in | Wt 229.6 lb

## 2024-01-05 DIAGNOSIS — I10 Essential (primary) hypertension: Secondary | ICD-10-CM | POA: Diagnosis not present

## 2024-01-05 DIAGNOSIS — E785 Hyperlipidemia, unspecified: Secondary | ICD-10-CM | POA: Diagnosis not present

## 2024-01-05 DIAGNOSIS — R635 Abnormal weight gain: Secondary | ICD-10-CM | POA: Diagnosis not present

## 2024-01-05 MED ORDER — SIMVASTATIN 5 MG PO TABS: 5.0000 mg | ORAL_TABLET | Freq: Every day | ORAL | 0 refills | Status: DC

## 2024-01-05 MED ORDER — PHENTERMINE HCL 37.5 MG PO CAPS: 37.5000 mg | ORAL_CAPSULE | ORAL | 0 refills | Status: AC

## 2024-01-05 MED ORDER — AMLODIPINE BESYLATE 5 MG PO TABS: 5.0000 mg | ORAL_TABLET | Freq: Every day | ORAL | 0 refills | Status: DC

## 2024-01-05 NOTE — Progress Notes (Signed)
 Patient ID: Darlene Crosby, female    DOB: 03-Jan-1966  MRN: [id number]  CC: Chronic Conditions Follow-Up  Subjective: Darlene Crosby is a 58 y.o. female who presents for chronic conditions follow-up.   Her concerns today include:  - Doing well on Amlodipine , no issues/concerns. She does not complain of red flag symptoms such as but not limited to chest pain, shortness of breath, worst headache of life, nausea/vomiting.  - States she heard there was a recall on Atorvastatin  and quit taking. Denies associated red flag symptoms.  - Doing well on Phentermine , no issues/concerns.   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 04/12/2022   Status post lumbar laminectomy 09/20/2021   Body mass index (BMI) 37.0-37.9, adult 03/14/2019   Lumbar stenosis without neurogenic claudication 03/14/2019   Hyperlipidemia 02/08/2019   Lumbar spondylosis 10/25/2018   Degeneration of lumbar intervertebral disc 10/01/2018   Surgical wound dehiscence 06/06/2018   Osteoarthritis of left hip 04/25/2018   Spinal stenosis of lumbar region 02/23/2018   Pain in left knee 01/10/2018   Trigger thumb of right hand 03/30/2017   Symptomatic mammary hypertrophy 11/28/2014   Dyspnea 03/26/2013   Asthma vs VCD  02/28/2013   Hypertensive disorder 06/07/2012   Pyogenic granuloma 02/15/2012     Current Outpatient Medications on File Prior to Visit  Medication Sig Dispense Refill   albuterol  (VENTOLIN  HFA) 108 (90 Base) MCG/ACT inhaler Inhale 2 puffs into the lungs every 4 (four) hours as needed (wheezing and SOB). 8 g 2   Cyanocobalamin  (B-12 PO) Take 1 capsule by mouth daily.     diclofenac  (VOLTAREN ) 50 MG EC tablet TAKE 1 TABLET BY MOUTH 3 TIMES  DAILY 300 tablet 2   ELDERBERRY PO Take 1 tablet by mouth daily.     fluticasone  (FLONASE ) 50 MCG/ACT nasal spray Place 1 spray into both nostrils every  other day.     levocetirizine (XYZAL ) 5 MG tablet TAKE 1 TABLET BY MOUTH ONCE DAILY IN THE EVENING 90 tablet 0   Multiple Vitamin (MULTIVITAMIN WITH MINERALS) TABS tablet Take 1 tablet by mouth daily.     mupirocin  cream (BACTROBAN ) 2 % Apply 1 Application topically 2 (two) times daily. 60 g 1   oxyCODONE  (ROXICODONE ) 5 MG immediate release tablet Take 1 tablet (5 mg total) by mouth every 6 (six) hours as needed. 30 tablet 0   pregabalin  (LYRICA ) 75 MG capsule Take 1 capsule (75 mg total) by mouth 2 (two) times daily. 60 capsule 2   Spacer/Aero-Holding Chambers DEVI 1 Units by Does not apply route in the morning, at noon, and at bedtime. 1 Units 0   Turmeric 500 MG CAPS Take 500 mg by mouth 2 (two) times daily. (Patient taking differently: Take 500 mg by mouth daily.)     atorvastatin  (LIPITOR) 20 MG tablet Take 1 tablet (20 mg total) by mouth daily. (Patient not taking: Reported on 01/05/2024) 90 tablet 0   Calcium -Magnesium-Zinc (CAL-MAG-ZINC PO) Take 1 tablet by mouth 2 (two) times daily. (Patient taking differently: Take 1 tablet by mouth daily.)     No current facility-administered medications on file prior to visit.    Allergies  Allergen Reactions   Pineapple Shortness Of Breath, Swelling and Hives    Swelling of tongue    Chocolate Hives   Coconut (Cocos Nucifera) Hives and Rash   Strawberry Extract Hives and Rash  Wheat Hives   Flavoring Agent Rash    Social History   Socioeconomic History   Marital status: Widowed    Spouse name: Not on file   Number of children: 2   Years of education: Not on file   Highest education level: Associate degree: occupational, scientist, product/process development, or vocational program  Occupational History   Not on file  Tobacco Use   Smoking status: Never    Passive exposure: Never   Smokeless tobacco: Never  Vaping Use   Vaping status: Never Used  Substance and Sexual Activity   Alcohol  use: Yes    Alcohol /week: 1.0 standard drink of alcohol     Types: 1  Glasses of wine per week   Drug use: Never   Sexual activity: Not on file  Other Topics Concern   Not on file  Social History Narrative   Not on file   Social Drivers of Health   Financial Resource Strain: Low Risk  (10/13/2023)   Overall Financial Resource Strain (CARDIA)    Difficulty of Paying Living Expenses: Not hard at all  Food Insecurity: Food Insecurity Present (10/13/2023)   Hunger Vital Sign    Worried About Running Out of Food in the Last Year: Sometimes true    Ran Out of Food in the Last Year: Often true  Transportation Needs: No Transportation Needs (10/13/2023)   PRAPARE - Administrator, Civil Service (Medical): No    Lack of Transportation (Non-Medical): No  Physical Activity: Insufficiently Active (10/13/2023)   Exercise Vital Sign    Days of Exercise per Week: 3 days    Minutes of Exercise per Session: 30 min  Stress: No Stress Concern Present (10/13/2023)   [employer] of Occupational Health - Occupational Stress Questionnaire    Feeling of Stress: Not at all  Social Connections: Moderately Isolated (10/13/2023)   Social Connection and Isolation Panel    Frequency of Communication with Friends and Family: More than three times a week    Frequency of Social Gatherings with Friends and Family: Once a week    Attends Religious Services: More than 4 times per year    Active Member of [employer] or Organizations: No    Attends Banker Meetings: Not on file    Marital Status: Widowed  Intimate Partner Violence: Not At Risk (04/13/2023)   Humiliation, Afraid, Rape, and Kick questionnaire    Fear of Current or Ex-Partner: No    Emotionally Abused: No    Physically Abused: No    Sexually Abused: No    Family History  Problem Relation Age of Onset   Colon polyps Mother 70   Hypertension Mother    Asthma Mother    Allergies Mother    Breast cancer Sister    Heart disease Sister    Hypertension Sister    Stroke Sister    Diabetes Sister     Colon cancer Neg Hx    Esophageal cancer Neg Hx    Stomach cancer Neg Hx    Pancreatic cancer Neg Hx    Rectal cancer Neg Hx     Past Surgical History:  Procedure Laterality Date   ANTERIOR HIP REVISION Left 06/06/2018   Procedure: LEFT HIP WOUND DEHISCENCE POSSIBLE HEAD AND LINER EXCHANGE;  Surgeon: Fidel Rogue, MD;  Location: [facility];  Service: Orthopedics;  Laterality: Left;   COLONOSCOPY  2015   DECOMPRESSIVE LUMBAR LAMINECTOMY LEVEL 2 N/A 09/22/2023   Procedure: DECOMPRESSIVE LUMBAR LAMINECTOMY LEVEL 2;  Surgeon: Georgina,  Ozell LABOR, MD;  Location: [facility];  Service: Orthopedics;  Laterality: N/A;  Lumbar 2 and Lumbar 3 segmental laminectomies with partial medial facetectomies   DIAGNOSTIC LAPAROSCOPY     tubal preg-took ovary and tube   LAPAROSCOPY FOR ECTOPIC PREGNANCY  1990s   LEFT SALPINGECTOMY AND RIGHT OOPHORECTOMY FOR ABNORMALITY   LUMBAR LAMINECTOMY N/A 09/20/2021   Procedure: L3-4 AND L4-5 CENTRAL LUMBAR LAMINECTOMIES;  Surgeon: Lucilla Lynwood BRAVO, MD;  Location: [facility];  Service: Orthopedics;  Laterality: N/A;   MASS EXCISION  02/15/2012   Procedure: EXCISION MASS;  Surgeon: Donnice LABOR Robinsons, MD;  Location: [facility];  Service: Orthopedics;  Laterality: Right;  Excision of Right Small Volar Mass   MYELOGRAM     TONSILLECTOMY  age 84   TOTAL HIP ARTHROPLASTY Left 04/25/2018   Procedure: TOTAL HIP ARTHROPLASTY ANTERIOR APPROACH;  Surgeon: Fidel Rogue, MD;  Location: [facility];  Service: Orthopedics;  Laterality: Left;   TRIGGER FINGER RELEASE Right 2018   thumb   WISDOM TOOTH EXTRACTION  age 23    ROS: Review of Systems Negative except as stated above  PHYSICAL EXAM: BP 133/80   Pulse 87   Temp 97.7 F (36.5 C) (Oral)   Resp 16   Ht 5' 5 (1.651 m)   Wt 229 lb 9.6 oz (104.1 kg)   LMP 05/04/2012   SpO2 94%   BMI 38.21 kg/m   Wt Readings from Last 3 Encounters:  01/05/24 229 lb 9.6 oz (104.1 kg)  11/23/23 229 lb 12.8 oz (104.2 kg)  11/16/23  225 lb (102.1 kg)   Physical Exam HENT:     Head: Normocephalic and atraumatic.     Nose: Nose normal.     Mouth/Throat:     Mouth: Mucous membranes are moist.     Pharynx: Oropharynx is clear.  Eyes:     Extraocular Movements: Extraocular movements intact.     Conjunctiva/sclera: Conjunctivae normal.     Pupils: Pupils are equal, round, and reactive to light.  Cardiovascular:     Rate and Rhythm: Normal rate and regular rhythm.     Pulses: Normal pulses.     Heart sounds: Normal heart sounds.  Pulmonary:     Effort: Pulmonary effort is normal.     Breath sounds: Normal breath sounds.  Musculoskeletal:        General: Normal range of motion.     Cervical back: Normal range of motion and neck supple.  Neurological:     General: No focal deficit present.     Mental Status: She is alert and oriented to person, place, and time.  Psychiatric:        Mood and Affect: Mood normal.        Behavior: Behavior normal.     ASSESSMENT AND PLAN: 1. Primary hypertension (Primary) - Continue Amlodipine  as prescribed.  - Counseled on blood pressure goal of less than 130/80, low-sodium, DASH diet, medication compliance, and 150 minutes of moderate intensity exercise per week as tolerated. Counseled on medication adherence and adverse effects.  - Follow-up with primary provider in 3 months or sooner if needed.  - amLODipine  (NORVASC ) 5 MG tablet; Take 1 tablet (5 mg total) by mouth daily.  Dispense: 90 tablet; Refill: 0  2. Hyperlipidemia, unspecified hyperlipidemia type - Atorvastatin  discontinued.  - Trial Simvastatin as prescribed. Counseled on medication adherence/adverse effects.  - Follow-up with primary provider in 4 weeks or sooner if needed.  - simvastatin (ZOCOR) 5 MG tablet;  Take 1 tablet (5 mg total) by mouth at bedtime.  Dispense: 90 tablet; Refill: 0  3. Weight gain - Patient same weight as previous office visit.  - Continue Phentermine  as prescribed. Counseled on medication  adherence/adverse effects. - I did check the [employer]  prescription drug database.  - Follow-up with primary provider in 4 weeks or sooner if needed.  - phentermine  37.5 MG capsule; Take 1 capsule (37.5 mg total) by mouth every morning.  Dispense: 30 capsule; Refill: 0   Patient was given the opportunity to ask questions.  Patient verbalized understanding of the plan and was able to repeat key elements of the plan. Patient was given clear instructions to go to Emergency Department or return to medical center if symptoms don't improve, worsen, or new problems develop.The patient verbalized understanding.   Requested Prescriptions   Signed Prescriptions Disp Refills   simvastatin (ZOCOR) 5 MG tablet 90 tablet 0    Sig: Take 1 tablet (5 mg total) by mouth at bedtime.   amLODipine  (NORVASC ) 5 MG tablet 90 tablet 0    Sig: Take 1 tablet (5 mg total) by mouth daily.   phentermine  37.5 MG capsule 30 capsule 0    Sig: Take 1 capsule (37.5 mg total) by mouth every morning.    Return in about 4 weeks (around 02/02/2024) for Follow-Up or next available weight check.  Greig JINNY Chute, NP

## 2024-01-05 NOTE — Progress Notes (Signed)
 4 week follow up

## 2024-01-08 ENCOUNTER — Ambulatory Visit: Admitting: Physical Therapy

## 2024-01-08 ENCOUNTER — Encounter: Payer: Self-pay | Admitting: Physical Therapy

## 2024-01-08 DIAGNOSIS — R262 Difficulty in walking, not elsewhere classified: Secondary | ICD-10-CM

## 2024-01-08 DIAGNOSIS — M6281 Muscle weakness (generalized): Secondary | ICD-10-CM

## 2024-01-08 DIAGNOSIS — M25552 Pain in left hip: Secondary | ICD-10-CM | POA: Diagnosis not present

## 2024-01-08 NOTE — Therapy (Signed)
 OUTPATIENT PHYSICAL THERAPY TREATMENT/PROGRESS NOTE   Patient Name: Darlene Crosby MRN: [id number] DOB:May 25, 1965, 58 y.o., female Today's Date: 01/08/2024  END OF SESSION:  PT End of Session - 01/08/24 1017     Visit Number 11    Number of Visits 17    Date for Recertification  01/31/24    Authorization Type [employer] Dual Complete    PT Start Time 1017    PT Stop Time 1100    PT Time Calculation (min) 43 min    Activity Tolerance Patient tolerated treatment well    Behavior During Therapy WFL for tasks assessed/performed                Past Medical History:  Diagnosis Date   Asthma    followed by pcp   Bronchitis    chronic   Hypertension    OA (osteoarthritis)    left knee, right shoulder, left hip   Pneumonia    a couple years ago per pt on 09/15/21   Seasonal allergies    Wears glasses    Past Surgical History:  Procedure Laterality Date   ANTERIOR HIP REVISION Left 06/06/2018   Procedure: LEFT HIP WOUND DEHISCENCE POSSIBLE HEAD AND LINER EXCHANGE;  Surgeon: Fidel Rogue, MD;  Location: [facility];  Service: Orthopedics;  Laterality: Left;   COLONOSCOPY  2015   DECOMPRESSIVE LUMBAR LAMINECTOMY LEVEL 2 N/A 09/22/2023   Procedure: DECOMPRESSIVE LUMBAR LAMINECTOMY LEVEL 2;  Surgeon: Georgina Ozell LABOR, MD;  Location: [facility];  Service: Orthopedics;  Laterality: N/A;  Lumbar 2 and Lumbar 3 segmental laminectomies with partial medial facetectomies   DIAGNOSTIC LAPAROSCOPY     tubal preg-took ovary and tube   LAPAROSCOPY FOR ECTOPIC PREGNANCY  1990s   LEFT SALPINGECTOMY AND RIGHT OOPHORECTOMY FOR ABNORMALITY   LUMBAR LAMINECTOMY N/A 09/20/2021   Procedure: L3-4 AND L4-5 CENTRAL LUMBAR LAMINECTOMIES;  Surgeon: Lucilla Lynwood BRAVO, MD;  Location: [facility];  Service: Orthopedics;  Laterality: N/A;   MASS EXCISION  02/15/2012   Procedure: EXCISION MASS;  Surgeon: Donnice LABOR Robinsons, MD;  Location: [facility];  Service: Orthopedics;  Laterality: Right;  Excision of  Right Small Volar Mass   MYELOGRAM     TONSILLECTOMY  age 72   TOTAL HIP ARTHROPLASTY Left 04/25/2018   Procedure: TOTAL HIP ARTHROPLASTY ANTERIOR APPROACH;  Surgeon: Fidel Rogue, MD;  Location: [facility];  Service: Orthopedics;  Laterality: Left;   TRIGGER FINGER RELEASE Right 2018   thumb   WISDOM TOOTH EXTRACTION  age 72   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 04/12/2022   Status post lumbar laminectomy 09/20/2021   Body mass index (BMI) 37.0-37.9, adult 03/14/2019   Lumbar stenosis without neurogenic claudication 03/14/2019   Hyperlipidemia 02/08/2019   Lumbar spondylosis 10/25/2018   Degeneration of lumbar intervertebral disc 10/01/2018   Surgical wound dehiscence 06/06/2018   Osteoarthritis of left hip 04/25/2018   Spinal stenosis of lumbar region 02/23/2018   Pain in left knee 01/10/2018   Trigger thumb of right hand 03/30/2017   Symptomatic mammary hypertrophy 11/28/2014   Dyspnea 03/26/2013   Asthma vs VCD  02/28/2013   Hypertensive disorder 06/07/2012   Pyogenic granuloma 02/15/2012    PCP: Lorren Greig PARAS, NP  REFERRING PROVIDER: Georgina Ozell LABOR, MD  REFERRING DIAG: [phone] (ICD-10-CM) - Weakness of left hip   THERAPY DIAG:  Muscle weakness (generalized)  Difficulty in walking, not elsewhere classified  Rationale for Evaluation and Treatment: Rehabilitation  ONSET DATE: 09/22/2023  SUBJECTIVE:   SUBJECTIVE STATEMENT: Pt reports no pain on arrival. Completing HEP. Has difficulty with stairs, causes pain.  EVAL: Pt presents to PT s/p decompressive lumbar laminectomy performed by Dr. Georgina on 09/22/23. Pt notes that pain is occasionally in L anterior thigh and is especially present when fatigued. Denies sensation change in L LE. Symptoms of pain have resolved really well post surgery. She is frustrated by her continued inability to flex L  hip in seated position.    PERTINENT HISTORY: L THA, Lumbar decompression, HTN  PAIN:  Are you having pain?  Yes: NPRS scale: 0/10 Worst: 0/10 Pain location: L anterior thigh Pain description: sore Aggravating factors: squats, stairs Relieving factors: heat  PRECAUTIONS: None  RED FLAGS: None   WEIGHT BEARING RESTRICTIONS: No  FALLS:  Has patient fallen in last 6 months? No  LIVING ENVIRONMENT: Lives with: lives with their family Lives in: House/apartment Stairs: 1 STE Has following equipment at home: Single point cane  PLOF: Independent  PATIENT GOALS: improve L LE strength, get back to being able to do line dancing without limitation   NEXT MD VISIT: 12/18/2023  OBJECTIVE:  Note: Objective measures were completed at Evaluation unless otherwise noted.  DIAGNOSTIC FINDINGS: N/A  PATIENT SURVEYS:  LEFS  Extreme difficulty/unable (0), Quite a bit of difficulty (1), Moderate difficulty (2), Little difficulty (3), No difficulty (4) Survey date:  11/20/2023 01/03/2024  Any of your usual work, housework or school activities 2   2. Usual hobbies, recreational or sporting activities 3   3. Getting into/out of the bath 3   4. Walking between rooms 4   5. Putting on socks/shoes 3   6. Squatting  1   7. Lifting an object, like a bag of groceries from the floor 1   8. Performing light activities around your home 2   9. Performing heavy activities around your home 3   10. Getting into/out of a car 1   11. Walking 2 blocks 4   12. Walking 1 mile 4   13. Going up/down 10 stairs (1 flight) 3   14. Standing for 1 hour 3   15.  sitting for 1 hour 4   16. Running on even ground 0   17. Running on uneven ground 0   18. Making sharp turns while running fast 0   19. Hopping  1   20. Rolling over in bed 1   Score total:  42/80 45/80     COGNITION: Overall cognitive status: Within functional limits for tasks assessed     SENSATION: Light touch: Impaired   POSTURE:  increased lumbar lordosis and flexed trunk   PALPATION: Slight TTP to proximal L thigh  LOWER EXTREMITY ROM:  Active ROM Right eval Left eval  Hip flexion 110 45  Hip extension    Hip abduction    Hip adduction    Hip internal rotation    Hip external rotation    Knee flexion    Knee extension    Ankle dorsiflexion    Ankle plantarflexion    Ankle inversion    Ankle eversion     (Blank rows = not tested)  LOWER EXTREMITY MMT:  MMT Right eval Left eval Left 01/03/2024  Hip flexion 4 2+ 3  Hip extension     Hip abduction 4 4   Hip adduction     Hip internal  rotation     Hip external rotation     Knee flexion 5 5   Knee extension 5 5   Ankle dorsiflexion     Ankle plantarflexion     Ankle inversion     Ankle eversion      (Blank rows = not tested)  LOWER EXTREMITY SPECIAL TESTS:  DNT  FUNCTIONAL TESTS:  30 Second Sit to Stand: 11 reps 12/25/23: sit to stand: 13 reps   GAIT: Distance walked: 77ft Assistive device utilized: None Level of assistance: Complete Independence Comments: antalgic gait L LE   TREATMENT: [facility] Adult PT Treatment:                                                DATE: 01/08/24 Therapeutic Exercise: Nustep 8 min L6 LE only  Cybex hip 25 # hip abdct x 10 each  Cybe x hip ext 12.5 # x 10 each Cybex hip flexion 12.5 # x 10 each  SL leg press 4 x 10 40#  STS with LLE back 10 x 3  Supine SLR 3x10 L  Bridge 10 x 2 SL hip clam x 10    [facility] Adult PT Treatment:                                                DATE: 01/03/24 NuStep lvl 6 LE only x 5 minutes Assessment of tests/measures, goals, and outcomes for progress note Supine SLR 3x10 L Bridge with ball 2x15 Hooklying clamshell 3x15 blue band STS 3x10 L back low table no UE Cybex single leg press 3x10 40# L Standing hip abd 2x10 25# Standing hip flex 2x10 12.5# Cybex single leg press 3x8 40# L Supine SLR 3x10 L Supine heel slide 2x15 L  [facility] Adult PT Treatment:                                                 DATE: 12/27/23 NuStep lvl 6 LE only x 5 minutes Standing hip abd 2x10 25# Standing hip flex 2x10 12.5# Cybex single leg press 3x8 40# L Supine SLR 3x10 L Supine heel slide 2x15 L  [facility] Adult PT Treatment:                                                DATE: 12/25/23 Rec Bike L2 x 5 minutes  Standing hip flexion 2# 10 x 2 each ( 1 set knee extended) Standing hp abdct 2# x 10 each  Standing hip ext 2@ x 10 each  Leg press 45# 20 x 2 Supine SLR 2x10 L Supine heel slide x 10 with slide board  Bridge with ball squeeze 3 x 10  Unable to supine march  30 sec STS 13 reps low mat without UE HMP to left thigh x 10 minutes   [facility] Adult PT Treatment:  DATE: 12/20/2023 NuStep lvl 5 LE only x 5 min for functional activity tolerance Standing march 2x10 2# L Single leg press 3x10 45# L Supine SLR 3x10 L Supine march - unable  Bridge with ball 3x10 STS L foot back 2x10 - no UE TKE with GTB 2x10 L Step up 8in L stance 2x10 fwd - no UE Wall squat 2x10   [facility] Adult PT Treatment:                                                DATE: 12/18/2023 NuStep lvl 5 LE only x 5 min for functional activity tolerance Seated march x 10 each  Supine SLR 2x10 L Bridge blue band 2x15 Bridge with ball squeeze 10 x 2  Hooklying clamshell 2x15 blue band STS L foot back 2x10 - no UE Standing march  Heel raises  LAQ 2x10 L 5# with ball squeeze  Leg press single 3x10 L only 35#   [facility] Adult PT Treatment:                                                DATE: 12/13/2023 NuStep lvl 5 LE only x 4 min for functional activity tolerance Supine QS x 10 - 5 hold Supine SLR 2x10 L Bridge blue band 2x15 Hooklying clamshell 2x15 blue band STS L foot back 2x10 - no UE LAQ 2x10 L 5# Leg press single 3x10 L only 35# Step up 8in fwd L leading x 10 1 UE  PATIENT EDUCATION:  Education details: eval findings, LEFS, HEP, POC Person educated:  Patient Education method: Explanation, Demonstration, and Handouts Education comprehension: verbalized understanding and returned demonstration  HOME EXERCISE PROGRAM: Access Code: [email or web address] URL: [email or web address] Date: 11/20/2023 Prepared by: Alm Kingdom  Exercises - Supine Quad Set  - 1 x daily - 7 x weekly - 2 sets - 10 reps - 5 sec hold - Active Straight Leg Raise with Quad Set (Mirrored)  - 1 x daily - 7 x weekly - 3 sets - 10 reps - Supine Heel Slide (Mirrored)  - 1 x daily - 7 x weekly - 3 sets - 10 reps - 3 sec hold - Sidelying Hip Abduction  - 1 x daily - 7 x weekly - 2-3 sets - 10 reps - Sit to Stand Without Arm Support  - 1 x daily - 7 x weekly - 2-3 sets - 10 reps  ASSESSMENT:  CLINICAL IMPRESSION: Pt reports no pain on arrival and compliance with HEP, attending OT and gym. Saw MD who recommended continued PT. Still having difficulty with left hip flexion strength and climbing stairs.  EVAL: Patient is a 58 y.o. F who was seen today for physical therapy evaluation and treatment for continued L LE weakness s/p lumbar laminectomy performed on 09/22/23. Physical findings are consistent with surgery and recovery timeline as pt demonstrates decrease in L LE strength and general decrease in functional mobility. LEFS scores shows she is operating below PLOF for home ADLs and higher level mobility. Pt would benefit from skilled PT services working on improving LE strength and mobility post operatively in order to improve function and decrease L thigh pain.   OBJECTIVE IMPAIRMENTS: Abnormal gait, decreased activity tolerance, decreased balance,  decreased endurance, decreased mobility, difficulty walking, decreased ROM, decreased strength, and pain   ACTIVITY LIMITATIONS: standing, squatting, stairs, transfers, and locomotion level  PARTICIPATION LIMITATIONS: meal prep, cleaning, driving, shopping, community activity, and yard work  PERSONAL FACTORS: Time since onset  of injury/illness/exacerbation and 3+ comorbidities: L THA, Lumbar decompression, HTN are also affecting patient's functional outcome.   REHAB POTENTIAL: Excellent  CLINICAL DECISION MAKING: Stable/uncomplicated  EVALUATION COMPLEXITY: Low   GOALS: Goals reviewed with patient? No  SHORT TERM GOALS: Target date: 12/11/2023   Pt will be compliant and knowledgeable with initial HEP for improved comfort and carryover Baseline: initial HEP given  Goal status: MET  2.  Pt will self report L LE pain no greater than 5/10 for improved comfort and functional ability Baseline: 8/10 at worst 12/11/23: 7/10 now that I have started coughing  12/25/23: 5/10 at worst  Goal status: MET  LONG TERM GOALS: Target date: 01/15/2024   Pt will improve LEFS to no less than 52/80 as proxy for functional improvement with home ADLs and higher level community activity Baseline: 42/80 01/03/2024: 45/80 Goal status: IN PROGRESS   2.  Pt will self report L LE pain no greater than 1-2/10 for improved comfort and functional ability Baseline: 8/10 at worst 01/03/2024: 7/10 at worst Goal status: IN PROGRESS   3.  Pt will increase 30 Second Sit to Stand rep count to no less than 15 reps for improved balance, strength, and functional mobility Baseline: 11 reps  12/25/23: 13 reps  01/03/2024: 13 reps Goal status: ONGOING   4.  Pt will improve L hip flexion MMT to no less than 4/5 for improved functional mobility and decreased pain Baseline: 2+/5 01/03/2024: 3/5 Goal status: ING PROGRESS  5.  Pt will improve L active hip flexion in supine to no less than 90 degrees for improved functional mobility Baseline: 45 degrees Goal status: IN PROGRESS   PLAN:  PT FREQUENCY: 2x/week  PT DURATION: 8 weeks  PLANNED INTERVENTIONS: [id number]- PT Re-evaluation, 97110-Therapeutic exercises, 97530- Therapeutic activity, [id number]- Neuromuscular re-education, 97535- Self Care, [id number]- Manual therapy, [id number]- Gait training, [phone]-  Electrical stimulation (unattended), [id number]- Electrical stimulation (manual), 20560 (1-2 muscles), [id number] (3+ muscles)- Dry Needling, Patient/Family education, Cryotherapy, and Moist heat  PLAN FOR NEXT SESSION: assess HEP response, LE strengthening with emphasis on L hip flexion   Harlene HERO Caro Brundidge PTA  01/08/24 11:06 AM

## 2024-01-09 ENCOUNTER — Encounter: Payer: Self-pay | Admitting: Occupational Therapy

## 2024-01-09 ENCOUNTER — Ambulatory Visit: Admitting: Occupational Therapy

## 2024-01-09 DIAGNOSIS — R600 Localized edema: Secondary | ICD-10-CM

## 2024-01-09 DIAGNOSIS — M25641 Stiffness of right hand, not elsewhere classified: Secondary | ICD-10-CM

## 2024-01-09 DIAGNOSIS — M25552 Pain in left hip: Secondary | ICD-10-CM | POA: Diagnosis not present

## 2024-01-09 DIAGNOSIS — M79641 Pain in right hand: Secondary | ICD-10-CM

## 2024-01-09 DIAGNOSIS — M6281 Muscle weakness (generalized): Secondary | ICD-10-CM

## 2024-01-09 DIAGNOSIS — R208 Other disturbances of skin sensation: Secondary | ICD-10-CM

## 2024-01-09 DIAGNOSIS — R278 Other lack of coordination: Secondary | ICD-10-CM

## 2024-01-09 NOTE — Therapy (Signed)
 OUTPATIENT OCCUPATIONAL THERAPY ORTHO TREATMENT  Patient Name: Darlene Crosby MRN: [id number] DOB:1965-07-11, 58 y.o., female Today's Date: 01/09/2024  PCP: Darlene Greig PARAS, NP  REFERRING PROVIDER: Arlinda Buster, MD   END OF SESSION:  OT End of Session - 01/09/24 1105     Visit Number 2    Number of Visits 16    Date for Recertification  03/04/24    Authorization Type [employer] Dual complete - covered 100%    Progress Note Due on Visit 10    OT Start Time 1102    OT Stop Time 1145    OT Time Calculation (min) 43 min    Activity Tolerance Patient tolerated treatment well    Behavior During Therapy WFL for tasks assessed/performed          Past Medical History:  Diagnosis Date   Asthma    followed by pcp   Bronchitis    chronic   Hypertension    OA (osteoarthritis)    left knee, right shoulder, left hip   Pneumonia    a couple years ago per pt on 09/15/21   Seasonal allergies    Wears glasses    Past Surgical History:  Procedure Laterality Date   ANTERIOR HIP REVISION Left 06/06/2018   Procedure: LEFT HIP WOUND DEHISCENCE POSSIBLE HEAD AND LINER EXCHANGE;  Surgeon: Darlene Rogue, MD;  Location: [facility];  Service: Orthopedics;  Laterality: Left;   COLONOSCOPY  2015   DECOMPRESSIVE LUMBAR LAMINECTOMY LEVEL 2 N/A 09/22/2023   Procedure: DECOMPRESSIVE LUMBAR LAMINECTOMY LEVEL 2;  Surgeon: Darlene Ozell LABOR, MD;  Location: [facility];  Service: Orthopedics;  Laterality: N/A;  Lumbar 2 and Lumbar 3 segmental laminectomies with partial medial facetectomies   DIAGNOSTIC LAPAROSCOPY     tubal preg-took ovary and tube   LAPAROSCOPY FOR ECTOPIC PREGNANCY  1990s   LEFT SALPINGECTOMY AND RIGHT OOPHORECTOMY FOR ABNORMALITY   LUMBAR LAMINECTOMY N/A 09/20/2021   Procedure: L3-4 AND L4-5 CENTRAL LUMBAR LAMINECTOMIES;  Surgeon: Darlene Lynwood BRAVO, MD;  Location: [facility];  Service: Orthopedics;  Laterality: N/A;   MASS EXCISION  02/15/2012   Procedure: EXCISION MASS;  Surgeon: Darlene Crosby Robinsons, MD;  Location: [facility];  Service: Orthopedics;  Laterality: Right;  Excision of Right Small Volar Mass   MYELOGRAM     TONSILLECTOMY  age 26   TOTAL HIP ARTHROPLASTY Left 04/25/2018   Procedure: TOTAL HIP ARTHROPLASTY ANTERIOR APPROACH;  Surgeon: Darlene Rogue, MD;  Location: [facility];  Service: Orthopedics;  Laterality: Left;   TRIGGER FINGER RELEASE Right 2018   thumb   WISDOM TOOTH EXTRACTION  age 75   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 04/12/2022   Status post lumbar laminectomy 09/20/2021   Body mass index (BMI) 37.0-37.9, adult 03/14/2019   Lumbar stenosis without neurogenic claudication 03/14/2019   Hyperlipidemia 02/08/2019   Lumbar spondylosis 10/25/2018   Degeneration of lumbar intervertebral disc 10/01/2018   Surgical wound dehiscence 06/06/2018   Osteoarthritis of left hip 04/25/2018   Spinal stenosis of lumbar region 02/23/2018   Pain in left knee 01/10/2018   Trigger thumb of right hand 03/30/2017   Symptomatic mammary hypertrophy 11/28/2014   Dyspnea 03/26/2013   Asthma vs VCD  02/28/2013   Hypertensive disorder 06/07/2012   Pyogenic granuloma 02/15/2012    ONSET DATE: 11/20/23 referral date (surgery 12/07/23)   REFERRING DIAG: M25.541 (ICD-10-CM) -  Arthralgia of metacarpophalangeal joint, right   Note: Needs OT 2 weeks s/p right long finger MCP arthroplasty  SPLINT  Imaging: XR Hand Complete Right Result Date: 12/21/2023 Stable appearance of the long finger MCP region status post prior resection arthroplasty, appropriate spacing is notable between the metacarpal head and proximal phalanx base  THERAPY DIAG:  Muscle weakness (generalized)  Pain in right hand  Stiffness of right hand, not elsewhere classified  Localized edema  Other disturbances of skin sensation  Other lack of  coordination  Rationale for Evaluation and Treatment: Rehabilitation  SUBJECTIVE:   SUBJECTIVE STATEMENT: The splint is doing fine and the compressive stockinette is also helping Pt accompanied by: self  PERTINENT HISTORY: Lt THA 2020, decompression of lumbar spline 08/2023 (L2-3)   PRECAUTIONS: Other: per protocol Rt hand, no lifting over 20 lbs d/t back    WEIGHT BEARING RESTRICTIONS: NWB through Rt hand  PAIN:  Are you having pain? No, just stiffness Rt hand  FALLS: Has patient fallen in last 6 months? No  LIVING ENVIRONMENT: Lives with: lives with 89 y.o. grandson Lives in: 1 story home, 1 step to enter Has following equipment at home: Single point cane, Environmental Consultant - 2 wheeled, and bed side commode  PLOF: Independent and on disability d/t hip   PATIENT GOALS: Get use of my Rt hand back  NEXT MD VISIT: 02/01/24  OBJECTIVE:  Note: Objective measures were completed at Evaluation unless otherwise noted.  HAND DOMINANCE: Right  ADLs: Overall ADLs: Mod I for BADLS and IADLS w/ Lt hand and difficulty  FUNCTIONAL OUTCOME MEASURES: 01/09/24: [name]: 52.27% Deficit RUE   UPPER EXTREMITY ROM:   BUE AROM WNLs at shoulders, elbows, and forearms  Active ROM Right eval Left eval  Shoulder flexion    Shoulder abduction    Shoulder adduction    Shoulder extension    Shoulder internal rotation    Shoulder external rotation    Elbow flexion    Elbow extension    Wrist flexion 45   Wrist extension 35   Wrist ulnar deviation    Wrist radial deviation    Wrist pronation    Wrist supination    (Blank rows = not tested)  Active ROM Right eval Left eval  Thumb MCP (0-60)    Thumb IP (0-80)    Thumb Radial abd/add (0-55)     Thumb Palmar abd/add (0-45)     Thumb Opposition to Small Finger     Index MCP (0-90)     Index PIP (0-100)     Index DIP (0-70)      Long MCP (0-90) 54*, -15*     Long PIP (0-100) 75*, 0*     Long DIP (0-70)      Ring MCP (0-90)      Ring  PIP (0-100)      Ring DIP (0-70)      Little MCP (0-90)      Little PIP (0-100)      Little DIP (0-70)      (Blank rows = not tested)   UPPER EXTREMITY MMT:   NT   HAND FUNCTION: Grip strength TBD once precautions lifted  COORDINATION: Impaired d/t edema and decreased ROM   SENSATION: Not formally assessed  EDEMA: moderate dorsal hand and in fingers (long finger worse)  COGNITION: Overall cognitive status: Within functional limits for tasks assessed  OBSERVATIONS: Pt came directly from MD office w/ gauze dressing wrapped around hand.    TREATMENT DATE:  01/09/24                                                                                                                            Assessed Quick Hollis - see above and updated LTG  Pt reports splint going well w/ no need for adjustments Pt also now tolerating tensogrip stockinette for edema management  Reviewed A/ROM HEP - pt return demo of each w/ min cues for effectiveness.  Added gentle PROM HEP - see pt instructions for details. Pt return demo of each  Pt shown scar massage and how to perform. Pt return demo. Handout provided  Pt issued tan foam for writing w/ Rt hand for success. Pt able to write much easier as she was writing w/ Lt non dominant hand since surgery. Pt also issued red foam for eating w/ Rt dominant hand as pt was eating w/ Lt hand d/t unable to hold fork/spoon w/ Rt hand   PATIENT EDUCATION: Education details: see above Person educated: Patient Education method: Programmer, Multimedia, Demonstration, Verbal cues, and Handouts Education comprehension: verbalized understanding, returned demonstration, and verbal cues required  HOME EXERCISE PROGRAM: 01/04/24: A/ROM HEP for Rt hand/wrist 01/09/24: P/ROM HEP, scar massage  GOALS: Goals reviewed with patient? Yes  SHORT TERM GOALS: Target date: 02/03/24  Independent with splint wear and care Baseline: Goal status: MET  2.  Independent with ROM HEP   Baseline:  Goal status: MET  3.  Pt to verbalize understanding of edema management strategies  Baseline:  Goal status: IN PROGRESS - pt now tolerating tensogrip stockinette  4.  Pt to improve in Rt long finger MP flexion and PIP flexion by 10* or more Baseline: 54*, 75*  Goal status: IN PROGRESS  5.  Pt to improve Rt long finger MP extension to -5*  Baseline: -15* Goal status: IN PROGRESS   LONG TERM GOALS: Target date: 03/04/24  Independent with updated strengthening HEP  Baseline:  Goal status: INITIAL  2.  Pt to demo ROM Rt hand WFL's to make fist for full grasp/release Baseline:  Goal status: INITIAL  3.  Pt to return to using Rt hand as dominant hand for ADLS and bilateral tasks Baseline:  Goal status: INITIAL  4.  Pt to demo 50% or greater Rt grip strength compared to Lt hand Baseline:  Goal status: INITIAL  5.  Quick Dash to be 20% or less deficit RUE Baseline: 52.27%  Goal status: REVISED   ASSESSMENT:  CLINICAL IMPRESSION: Patient seen today for occupational therapy treatment for s/p Rt long finger MCP arthroplasty on 12/07/23? Due to OA. Hx includes lumbar decompression and laminectomy (08/2023) and Lt THA in 2020 (currently seeing ortho P.T.). Patient already has improved since evaluation and has met 2 STG's .   PERFORMANCE DEFICITS: in functional skills including ADLs, IADLs, coordination, dexterity, sensation, edema, ROM, strength, pain, fascial restrictions, Fine motor control, Gross motor control, decreased knowledge of precautions, and UE functional use.   IMPAIRMENTS: are  limiting patient from ADLs, IADLs, leisure, and social participation.   COMORBIDITIES: may have co-morbidities  that affects occupational performance. Patient will benefit from skilled OT to address above impairments and improve overall function.  MODIFICATION OR ASSISTANCE TO COMPLETE EVALUATION: Min-Moderate modification of tasks or assist with assess necessary to complete an  evaluation.  OT OCCUPATIONAL PROFILE AND HISTORY: Detailed assessment: Review of records and additional review of physical, cognitive, psychosocial history related to current functional performance.  CLINICAL DECISION MAKING: Moderate - several treatment options, min-mod task modification necessary  REHAB POTENTIAL: Good  EVALUATION COMPLEXITY: Moderate      PLAN:  OT FREQUENCY: 1-2x/week (may begin at 1x/wk then increase to 2x/wk)  OT DURATION: 8 weeks  PLANNED INTERVENTIONS: 97535 self care/ADL training, 02889 therapeutic exercise, 97530 therapeutic activity, 97140 manual therapy, 97035 ultrasound, 97018 paraffin, 02960 fluidotherapy, 97010 moist heat, 97010 cryotherapy, 97034 contrast bath, 97760 Orthotic Initial, 97763 Orthotic/Prosthetic subsequent, scar mobilization, passive range of motion, compression bandaging, patient/family education, and DME and/or AE instructions  RECOMMENDED OTHER SERVICES: none at this time  CONSULTED AND AGREED WITH PLAN OF CARE: Patient  PLAN FOR NEXT SESSION: fluidotherapy, continue ROM - issue pen rolling ex, reassess ROM measurements Rt long finger   Burnard JINNY Roads, OT 01/09/2024, 11:06 AM

## 2024-01-09 NOTE — Patient Instructions (Addendum)
  FULLY STRAIGHTEN FINGERS BETWEEN EACH REP  PIP Flexion (Passive)   Use other hand to bend the middle joint of __all____ fingers down as far as possible. Hold _10___ seconds. Repeat __5__ times. Do _4-6___ sessions per day.   PROM: Finger MP Joints   Passively bend __all______ fingers of hand at big knuckle until stretch is felt. Hold _10___ seconds. Relax. Straighten finger as far as possible. Repeat __5__ times per set.  Do __4-6__ sessions per day.  MP / PIP / DIP Composite Flexion (Passive Stretch)    Use other hand to bend ___all___ fingers at all three joints. Hold __10__ seconds. Repeat __5__ times. Do __4-6__ sessions per day.  PIP Extension (Passive    Use thumb of other hand on top of joint and two fingers under- neath on either side to straighten middle joint AND big knuckle of __middle____ finger. Hold __10__ seconds. Repeat _5___ times. Do __4-6__ sessions per day.

## 2024-01-10 ENCOUNTER — Ambulatory Visit: Admitting: Physical Therapy

## 2024-01-10 ENCOUNTER — Encounter: Payer: Self-pay | Admitting: Physical Therapy

## 2024-01-10 DIAGNOSIS — R2689 Other abnormalities of gait and mobility: Secondary | ICD-10-CM

## 2024-01-10 DIAGNOSIS — M25552 Pain in left hip: Secondary | ICD-10-CM | POA: Diagnosis not present

## 2024-01-10 DIAGNOSIS — R262 Difficulty in walking, not elsewhere classified: Secondary | ICD-10-CM

## 2024-01-10 NOTE — Therapy (Signed)
 OUTPATIENT PHYSICAL THERAPY TREATMENT   Patient Name: Darlene Crosby MRN: [id number] DOB:August 16, 1965, 58 y.o., female Today's Date: 01/10/2024  END OF SESSION:  PT End of Session - 01/10/24 1057     Visit Number 12    Number of Visits 17    Date for Recertification  02/05/24    Authorization Type [employer] Dual Complete    PT Start Time 1100    PT Stop Time 1140    PT Time Calculation (min) 40 min    Activity Tolerance Patient tolerated treatment well    Behavior During Therapy WFL for tasks assessed/performed                Past Medical History:  Diagnosis Date   Asthma    followed by pcp   Bronchitis    chronic   Hypertension    OA (osteoarthritis)    left knee, right shoulder, left hip   Pneumonia    a couple years ago per pt on 09/15/21   Seasonal allergies    Wears glasses    Past Surgical History:  Procedure Laterality Date   ANTERIOR HIP REVISION Left 06/06/2018   Procedure: LEFT HIP WOUND DEHISCENCE POSSIBLE HEAD AND LINER EXCHANGE;  Surgeon: Fidel Rogue, MD;  Location: [facility];  Service: Orthopedics;  Laterality: Left;   COLONOSCOPY  2015   DECOMPRESSIVE LUMBAR LAMINECTOMY LEVEL 2 N/A 09/22/2023   Procedure: DECOMPRESSIVE LUMBAR LAMINECTOMY LEVEL 2;  Surgeon: Georgina Ozell LABOR, MD;  Location: [facility];  Service: Orthopedics;  Laterality: N/A;  Lumbar 2 and Lumbar 3 segmental laminectomies with partial medial facetectomies   DIAGNOSTIC LAPAROSCOPY     tubal preg-took ovary and tube   LAPAROSCOPY FOR ECTOPIC PREGNANCY  1990s   LEFT SALPINGECTOMY AND RIGHT OOPHORECTOMY FOR ABNORMALITY   LUMBAR LAMINECTOMY N/A 09/20/2021   Procedure: L3-4 AND L4-5 CENTRAL LUMBAR LAMINECTOMIES;  Surgeon: Lucilla Lynwood BRAVO, MD;  Location: [facility];  Service: Orthopedics;  Laterality: N/A;   MASS EXCISION  02/15/2012   Procedure: EXCISION MASS;  Surgeon: Donnice LABOR Robinsons, MD;  Location: [facility];  Service: Orthopedics;  Laterality: Right;  Excision of Right Small  Volar Mass   MYELOGRAM     TONSILLECTOMY  age 45   TOTAL HIP ARTHROPLASTY Left 04/25/2018   Procedure: TOTAL HIP ARTHROPLASTY ANTERIOR APPROACH;  Surgeon: Fidel Rogue, MD;  Location: [facility];  Service: Orthopedics;  Laterality: Left;   TRIGGER FINGER RELEASE Right 2018   thumb   WISDOM TOOTH EXTRACTION  age 35   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 04/12/2022   Status post lumbar laminectomy 09/20/2021   Body mass index (BMI) 37.0-37.9, adult 03/14/2019   Lumbar stenosis without neurogenic claudication 03/14/2019   Hyperlipidemia 02/08/2019   Lumbar spondylosis 10/25/2018   Degeneration of lumbar intervertebral disc 10/01/2018   Surgical wound dehiscence 06/06/2018   Osteoarthritis of left hip 04/25/2018   Spinal stenosis of lumbar region 02/23/2018   Pain in left knee 01/10/2018   Trigger thumb of right hand 03/30/2017   Symptomatic mammary hypertrophy 11/28/2014   Dyspnea 03/26/2013   Asthma vs VCD  02/28/2013   Hypertensive disorder 06/07/2012   Pyogenic granuloma 02/15/2012    PCP: Lorren Greig PARAS, NP  REFERRING PROVIDER: Georgina Ozell LABOR, MD  REFERRING DIAG: R29.898 (ICD-10-CM) - Weakness of left hip   THERAPY DIAG:  Difficulty in walking, not elsewhere  classified  Other abnormalities of gait and mobility  Rationale for Evaluation and Treatment: Rehabilitation  ONSET DATE: 09/22/2023  SUBJECTIVE:   SUBJECTIVE STATEMENT: Pt reports no pain on arrival. Completing HEP. Has difficulty with stairs, causes pain.  EVAL: Pt presents to PT s/p decompressive lumbar laminectomy performed by Dr. Georgina on 09/22/23. Pt notes that pain is occasionally in L anterior thigh and is especially present when fatigued. Denies sensation change in L LE. Symptoms of pain have resolved really well post surgery. She is frustrated by her continued inability to flex L  hip in seated position.    PERTINENT HISTORY: L THA, Lumbar decompression, HTN  PAIN:  Are you having pain?  Yes: NPRS scale: 0/10 Worst: 0/10 Pain location: L anterior thigh Pain description: sore Aggravating factors: squats, stairs Relieving factors: heat  PRECAUTIONS: None  RED FLAGS: None   WEIGHT BEARING RESTRICTIONS: No  FALLS:  Has patient fallen in last 6 months? No  LIVING ENVIRONMENT: Lives with: lives with their family Lives in: House/apartment Stairs: 1 STE Has following equipment at home: Single point cane  PLOF: Independent  PATIENT GOALS: improve L LE strength, get back to being able to do line dancing without limitation   NEXT MD VISIT: 12/18/2023  OBJECTIVE:  Note: Objective measures were completed at Evaluation unless otherwise noted.  DIAGNOSTIC FINDINGS: N/A  PATIENT SURVEYS:  LEFS  Extreme difficulty/unable (0), Quite a bit of difficulty (1), Moderate difficulty (2), Little difficulty (3), No difficulty (4) Survey date:  11/20/2023 01/03/2024  Any of your usual work, housework or school activities 2   2. Usual hobbies, recreational or sporting activities 3   3. Getting into/out of the bath 3   4. Walking between rooms 4   5. Putting on socks/shoes 3   6. Squatting  1   7. Lifting an object, like a bag of groceries from the floor 1   8. Performing light activities around your home 2   9. Performing heavy activities around your home 3   10. Getting into/out of a car 1   11. Walking 2 blocks 4   12. Walking 1 mile 4   13. Going up/down 10 stairs (1 flight) 3   14. Standing for 1 hour 3   15.  sitting for 1 hour 4   16. Running on even ground 0   17. Running on uneven ground 0   18. Making sharp turns while running fast 0   19. Hopping  1   20. Rolling over in bed 1   Score total:  42/80 45/80     COGNITION: Overall cognitive status: Within functional limits for tasks assessed     SENSATION: Light touch: Impaired   POSTURE:  increased lumbar lordosis and flexed trunk   PALPATION: Slight TTP to proximal L thigh  LOWER EXTREMITY ROM:  Active ROM Right eval Left eval  Hip flexion 110 45  Hip extension    Hip abduction    Hip adduction    Hip internal rotation    Hip external rotation    Knee flexion    Knee extension    Ankle dorsiflexion    Ankle plantarflexion    Ankle inversion    Ankle eversion     (Blank rows = not tested)  LOWER EXTREMITY MMT:  MMT Right eval Left eval Left 01/03/2024  Hip flexion 4 2+ 3  Hip extension     Hip abduction 4 4   Hip adduction  Hip internal rotation     Hip external rotation     Knee flexion 5 5   Knee extension 5 5   Ankle dorsiflexion     Ankle plantarflexion     Ankle inversion     Ankle eversion      (Blank rows = not tested)  LOWER EXTREMITY SPECIAL TESTS:  DNT  FUNCTIONAL TESTS:  30 Second Sit to Stand: 11 reps 12/25/23: sit to stand: 13 reps   GAIT: Distance walked: 54ft Assistive device utilized: None Level of assistance: Complete Independence Comments: antalgic gait L LE   TREATMENT: [facility] Adult PT Treatment:                                                DATE: 01/10/24 Therapeutic Exercise: Nustep 8 min L6 LE only  Stair negotiation 6 inch steps  Step taps 4 inch x 10, 6 inch x 10 SLS on foam with 3 way hip 10 x 2 SL leg press 20 x 40#, 20 x 60#  STS with LLE back 10 x 3    [facility] Adult PT Treatment:                                                DATE: 01/08/24 Therapeutic Exercise: Nustep 8 min L6 LE only  Cybex hip 25 # hip abdct x 10 each  Cybe x hip ext 12.5 # x 10 each Cybex hip flexion 12.5 # x 10 each  SL leg press 4 x 10 40#  STS with LLE back 10 x 3  Supine SLR 3x10 L  Bridge 10 x 2 SL hip clam x 10    [facility] Adult PT Treatment:                                                DATE: 01/03/24 NuStep lvl 6 LE only x 5 minutes Assessment of tests/measures, goals, and outcomes for progress note Supine SLR 3x10  L Bridge with ball 2x15 Hooklying clamshell 3x15 blue band STS 3x10 L back low table no UE Cybex single leg press 3x10 40# L Standing hip abd 2x10 25# Standing hip flex 2x10 12.5# Cybex single leg press 3x8 40# L Supine SLR 3x10 L Supine heel slide 2x15 L  [facility] Adult PT Treatment:                                                DATE: 12/27/23 NuStep lvl 6 LE only x 5 minutes Standing hip abd 2x10 25# Standing hip flex 2x10 12.5# Cybex single leg press 3x8 40# L Supine SLR 3x10 L Supine heel slide 2x15 L  [facility] Adult PT Treatment:                                                DATE:  12/25/23 Rec Bike L2 x 5 minutes  Standing hip flexion 2# 10 x 2 each ( 1 set knee extended) Standing hp abdct 2# x 10 each  Standing hip ext 2@ x 10 each  Leg press 45# 20 x 2 Supine SLR 2x10 L Supine heel slide x 10 with slide board  Bridge with ball squeeze 3 x 10  Unable to supine march  30 sec STS 13 reps low mat without UE HMP to left thigh x 10 minutes   [facility] Adult PT Treatment:                                                DATE: 12/20/2023 NuStep lvl 5 LE only x 5 min for functional activity tolerance Standing march 2x10 2# L Single leg press 3x10 45# L Supine SLR 3x10 L Supine march - unable  Bridge with ball 3x10 STS L foot back 2x10 - no UE TKE with GTB 2x10 L Step up 8in L stance 2x10 fwd - no UE Wall squat 2x10   [facility] Adult PT Treatment:                                                DATE: 12/18/2023 NuStep lvl 5 LE only x 5 min for functional activity tolerance Seated march x 10 each  Supine SLR 2x10 L Bridge blue band 2x15 Bridge with ball squeeze 10 x 2  Hooklying clamshell 2x15 blue band STS L foot back 2x10 - no UE Standing march  Heel raises  LAQ 2x10 L 5# with ball squeeze  Leg press single 3x10 L only 35#   [facility] Adult PT Treatment:                                                DATE: 12/13/2023 NuStep lvl 5 LE only x 4 min for functional activity  tolerance Supine QS x 10 - 5 hold Supine SLR 2x10 L Bridge blue band 2x15 Hooklying clamshell 2x15 blue band STS L foot back 2x10 - no UE LAQ 2x10 L 5# Leg press single 3x10 L only 35# Step up 8in fwd L leading x 10 1 UE  PATIENT EDUCATION:  Education details: eval findings, LEFS, HEP, POC Person educated: Patient Education method: Explanation, Demonstration, and Handouts Education comprehension: verbalized understanding and returned demonstration  HOME EXERCISE PROGRAM: Access Code: [email or web address] URL: [email or web address] Date: 11/20/2023 Prepared by: Alm Kingdom  Exercises - Supine Quad Set  - 1 x daily - 7 x weekly - 2 sets - 10 reps - 5 sec hold - Active Straight Leg Raise with Quad Set (Mirrored)  - 1 x daily - 7 x weekly - 3 sets - 10 reps - Supine Heel Slide (Mirrored)  - 1 x daily - 7 x weekly - 3 sets - 10 reps - 3 sec hold - Sidelying Hip Abduction  - 1 x daily - 7 x weekly - 2-3 sets - 10 reps - Sit to Stand Without Arm Support  - 1 x daily - 7 x weekly - 2-3 sets - 10 reps  ASSESSMENT:  CLINICAL IMPRESSION: Pt reports 4/10 pain on arrival and compliance with HEP, attending OT and gym. Saw MD who recommended continued PT. Still having difficulty with left hip flexion strength and climbing stairs. Today we worked on hip flexion step taps and stair climbing. She did well but with increased thigh pain.   EVAL: Patient is a 58 y.o. F who was seen today for physical therapy evaluation and treatment for continued L LE weakness s/p lumbar laminectomy performed on 09/22/23. Physical findings are consistent with surgery and recovery timeline as pt demonstrates decrease in L LE strength and general decrease in functional mobility. LEFS scores shows she is operating below PLOF for home ADLs and higher level mobility. Pt would benefit from skilled PT services working on improving LE strength and mobility post operatively in order to improve function and decrease L thigh  pain.   OBJECTIVE IMPAIRMENTS: Abnormal gait, decreased activity tolerance, decreased balance, decreased endurance, decreased mobility, difficulty walking, decreased ROM, decreased strength, and pain   ACTIVITY LIMITATIONS: standing, squatting, stairs, transfers, and locomotion level  PARTICIPATION LIMITATIONS: meal prep, cleaning, driving, shopping, community activity, and yard work  PERSONAL FACTORS: Time since onset of injury/illness/exacerbation and 3+ comorbidities: L THA, Lumbar decompression, HTN are also affecting patient's functional outcome.   REHAB POTENTIAL: Excellent  CLINICAL DECISION MAKING: Stable/uncomplicated  EVALUATION COMPLEXITY: Low   GOALS: Goals reviewed with patient? No  SHORT TERM GOALS: Target date: 12/11/2023   Pt will be compliant and knowledgeable with initial HEP for improved comfort and carryover Baseline: initial HEP given  Goal status: MET  2.  Pt will self report L LE pain no greater than 5/10 for improved comfort and functional ability Baseline: 8/10 at worst 12/11/23: 7/10 now that I have started coughing  12/25/23: 5/10 at worst  Goal status: MET  LONG TERM GOALS: Target date: 01/15/2024   Pt will improve LEFS to no less than 52/80 as proxy for functional improvement with home ADLs and higher level community activity Baseline: 42/80 01/03/2024: 45/80 Goal status: IN PROGRESS   2.  Pt will self report L LE pain no greater than 1-2/10 for improved comfort and functional ability Baseline: 8/10 at worst 01/03/2024: 7/10 at worst Goal status: IN PROGRESS   3.  Pt will increase 30 Second Sit to Stand rep count to no less than 15 reps for improved balance, strength, and functional mobility Baseline: 11 reps  12/25/23: 13 reps  01/03/2024: 13 reps Goal status: ONGOING   4.  Pt will improve L hip flexion MMT to no less than 4/5 for improved functional mobility and decreased pain Baseline: 2+/5 01/03/2024: 3/5 Goal status: ING  PROGRESS  5.  Pt will improve L active hip flexion in supine to no less than 90 degrees for improved functional mobility Baseline: 45 degrees Goal status: IN PROGRESS   PLAN:  PT FREQUENCY: 2x/week  PT DURATION: 8 weeks  PLANNED INTERVENTIONS: [id number]- PT Re-evaluation, 97110-Therapeutic exercises, 97530- Therapeutic activity, [id number]- Neuromuscular re-education, 97535- Self Care, [id number]- Manual therapy, [id number]- Gait training, [phone]- Electrical stimulation (unattended), [phone]- Electrical stimulation (manual), 20560 (1-2 muscles), 20561 (3+ muscles)- Dry Needling, Patient/Family education, Cryotherapy, and Moist heat  PLAN FOR NEXT SESSION: assess HEP response, LE strengthening with emphasis on L hip flexion  Harlene Persons, PTA 01/10/24 11:51 AM Phone: [phone] Fax: [phone]

## 2024-01-10 NOTE — Addendum Note (Signed)
 Addended by: JOHNA ALM BROCKS on: 01/10/2024 08:43 AM   Modules accepted: Orders

## 2024-01-15 ENCOUNTER — Ambulatory Visit: Admitting: Physical Therapy

## 2024-01-15 ENCOUNTER — Encounter: Payer: Self-pay | Admitting: Physical Therapy

## 2024-01-15 ENCOUNTER — Encounter: Payer: Self-pay | Admitting: Family

## 2024-01-15 DIAGNOSIS — M6281 Muscle weakness (generalized): Secondary | ICD-10-CM

## 2024-01-15 DIAGNOSIS — R262 Difficulty in walking, not elsewhere classified: Secondary | ICD-10-CM

## 2024-01-15 DIAGNOSIS — R2689 Other abnormalities of gait and mobility: Secondary | ICD-10-CM

## 2024-01-15 DIAGNOSIS — M25552 Pain in left hip: Secondary | ICD-10-CM | POA: Diagnosis not present

## 2024-01-15 NOTE — Therapy (Signed)
 OUTPATIENT PHYSICAL THERAPY TREATMENT   Patient Name: Darlene Crosby MRN: [id number] DOB:1965-11-05, 58 y.o., female Today's Date: 01/15/2024  END OF SESSION:  PT End of Session - 01/15/24 1054     Visit Number 13    Number of Visits 17    Date for Recertification  02/05/24    Authorization Type [employer] Dual Complete    PT Start Time 1100    PT Stop Time 1142    PT Time Calculation (min) 42 min    Activity Tolerance Patient tolerated treatment well    Behavior During Therapy WFL for tasks assessed/performed                Past Medical History:  Diagnosis Date   Asthma    followed by pcp   Bronchitis    chronic   Hypertension    OA (osteoarthritis)    left knee, right shoulder, left hip   Pneumonia    a couple years ago per pt on 09/15/21   Seasonal allergies    Wears glasses    Past Surgical History:  Procedure Laterality Date   ANTERIOR HIP REVISION Left 06/06/2018   Procedure: LEFT HIP WOUND DEHISCENCE POSSIBLE HEAD AND LINER EXCHANGE;  Surgeon: Fidel Rogue, MD;  Location: [facility];  Service: Orthopedics;  Laterality: Left;   COLONOSCOPY  2015   DECOMPRESSIVE LUMBAR LAMINECTOMY LEVEL 2 N/A 09/22/2023   Procedure: DECOMPRESSIVE LUMBAR LAMINECTOMY LEVEL 2;  Surgeon: Georgina Ozell LABOR, MD;  Location: [facility];  Service: Orthopedics;  Laterality: N/A;  Lumbar 2 and Lumbar 3 segmental laminectomies with partial medial facetectomies   DIAGNOSTIC LAPAROSCOPY     tubal preg-took ovary and tube   LAPAROSCOPY FOR ECTOPIC PREGNANCY  1990s   LEFT SALPINGECTOMY AND RIGHT OOPHORECTOMY FOR ABNORMALITY   LUMBAR LAMINECTOMY N/A 09/20/2021   Procedure: L3-4 AND L4-5 CENTRAL LUMBAR LAMINECTOMIES;  Surgeon: Lucilla Lynwood BRAVO, MD;  Location: [facility];  Service: Orthopedics;  Laterality: N/A;   MASS EXCISION  02/15/2012   Procedure: EXCISION MASS;  Surgeon: Donnice LABOR Robinsons, MD;  Location: [facility];  Service: Orthopedics;  Laterality: Right;  Excision of Right Small  Volar Mass   MYELOGRAM     TONSILLECTOMY  age 32   TOTAL HIP ARTHROPLASTY Left 04/25/2018   Procedure: TOTAL HIP ARTHROPLASTY ANTERIOR APPROACH;  Surgeon: Fidel Rogue, MD;  Location: [facility];  Service: Orthopedics;  Laterality: Left;   TRIGGER FINGER RELEASE Right 2018   thumb   WISDOM TOOTH EXTRACTION  age 21   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 04/12/2022   Status post lumbar laminectomy 09/20/2021   Body mass index (BMI) 37.0-37.9, adult 03/14/2019   Lumbar stenosis without neurogenic claudication 03/14/2019   Hyperlipidemia 02/08/2019   Lumbar spondylosis 10/25/2018   Degeneration of lumbar intervertebral disc 10/01/2018   Surgical wound dehiscence 06/06/2018   Osteoarthritis of left hip 04/25/2018   Spinal stenosis of lumbar region 02/23/2018   Pain in left knee 01/10/2018   Trigger thumb of right hand 03/30/2017   Symptomatic mammary hypertrophy 11/28/2014   Dyspnea 03/26/2013   Asthma vs VCD  02/28/2013   Hypertensive disorder 06/07/2012   Pyogenic granuloma 02/15/2012    PCP: Lorren Greig PARAS, NP  REFERRING PROVIDER: Georgina Ozell LABOR, MD  REFERRING DIAG: R29.898 (ICD-10-CM) - Weakness of left hip   THERAPY DIAG:  No diagnosis found.  Rationale  for Evaluation and Treatment: Rehabilitation  ONSET DATE: 09/22/2023  SUBJECTIVE:   SUBJECTIVE STATEMENT: Pt reports no pain on arrival. Completing HEP. Has difficulty with stairs and active hip flexion, causes pain.  EVAL: Pt presents to PT s/p decompressive lumbar laminectomy performed by Dr. Georgina on 09/22/23. Pt notes that pain is occasionally in L anterior thigh and is especially present when fatigued. Denies sensation change in L LE. Symptoms of pain have resolved really well post surgery. She is frustrated by her continued inability to flex L hip in seated position.    PERTINENT HISTORY: L  THA, Lumbar decompression, HTN  PAIN:  Are you having pain?  Yes: NPRS scale: 0/10 Worst: 0/10 Pain location: L anterior thigh Pain description: sore Aggravating factors: squats, stairs Relieving factors: heat  PRECAUTIONS: None  RED FLAGS: None   WEIGHT BEARING RESTRICTIONS: No  FALLS:  Has patient fallen in last 6 months? No  LIVING ENVIRONMENT: Lives with: lives with their family Lives in: House/apartment Stairs: 1 STE Has following equipment at home: Single point cane  PLOF: Independent  PATIENT GOALS: improve L LE strength, get back to being able to do line dancing without limitation   NEXT MD VISIT: 12/18/2023  OBJECTIVE:  Note: Objective measures were completed at Evaluation unless otherwise noted.  DIAGNOSTIC FINDINGS: N/A  PATIENT SURVEYS:  LEFS  Extreme difficulty/unable (0), Quite a bit of difficulty (1), Moderate difficulty (2), Little difficulty (3), No difficulty (4) Survey date:  11/20/2023 01/03/2024  Any of your usual work, housework or school activities 2   2. Usual hobbies, recreational or sporting activities 3   3. Getting into/out of the bath 3   4. Walking between rooms 4   5. Putting on socks/shoes 3   6. Squatting  1   7. Lifting an object, like a bag of groceries from the floor 1   8. Performing light activities around your home 2   9. Performing heavy activities around your home 3   10. Getting into/out of a car 1   11. Walking 2 blocks 4   12. Walking 1 mile 4   13. Going up/down 10 stairs (1 flight) 3   14. Standing for 1 hour 3   15.  sitting for 1 hour 4   16. Running on even ground 0   17. Running on uneven ground 0   18. Making sharp turns while running fast 0   19. Hopping  1   20. Rolling over in bed 1   Score total:  42/80 45/80     COGNITION: Overall cognitive status: Within functional limits for tasks assessed     SENSATION: Light touch: Impaired   POSTURE: increased lumbar lordosis and flexed trunk    PALPATION: Slight TTP to proximal L thigh  LOWER EXTREMITY ROM:  Active ROM Right eval Left eval  Hip flexion 110 45  Hip extension    Hip abduction    Hip adduction    Hip internal rotation    Hip external rotation    Knee flexion    Knee extension    Ankle dorsiflexion    Ankle plantarflexion    Ankle inversion    Ankle eversion     (Blank rows = not tested)  LOWER EXTREMITY MMT:  MMT Right eval Left eval Left 01/03/2024  Hip flexion 4 2+ 3  Hip extension     Hip abduction 4 4   Hip adduction     Hip internal rotation  Hip external rotation     Knee flexion 5 5   Knee extension 5 5   Ankle dorsiflexion     Ankle plantarflexion     Ankle inversion     Ankle eversion      (Blank rows = not tested)  LOWER EXTREMITY SPECIAL TESTS:  DNT  FUNCTIONAL TESTS:  30 Second Sit to Stand: 11 reps 12/25/23: sit to stand: 13 reps   GAIT: Distance walked: 31ft Assistive device utilized: None Level of assistance: Complete Independence Comments: antalgic gait L LE   TREATMENT: [facility] Adult PT Treatment:                                                DATE: 01/15/24 Therapeutic Exercise: Nustep L6 LE only x 8 minutes Left heel slide x 10 Left SLR 2 x 10  Bridge 15 SLS on foam with 2 way hip 10 x 2 6 inch step taps x 10 each  6 inch step up x 12 Side hip abduction 10 x 2  SL Leg press 60# x 20     [facility] Adult PT Treatment:                                                DATE: 01/10/24 Therapeutic Exercise: Nustep 8 min L6 LE only  Stair negotiation 6 inch steps  Step taps 4 inch x 10, 6 inch x 10 SLS on foam with 3 way hip 10 x 2 SL leg press 20 x 40#, 20 x 60#  STS with LLE back 10 x 3    [facility] Adult PT Treatment:                                                DATE: 01/08/24 Therapeutic Exercise: Nustep 8 min L6 LE only  Cybex hip 25 # hip abdct x 10 each  Cybe x hip ext 12.5 # x 10 each Cybex hip flexion 12.5 # x 10 each  SL leg press 4 x 10 40#   STS with LLE back 10 x 3  Supine SLR 3x10 L  Bridge 10 x 2 SL hip clam x 10    [facility] Adult PT Treatment:                                                DATE: 01/03/24 NuStep lvl 6 LE only x 5 minutes Assessment of tests/measures, goals, and outcomes for progress note Supine SLR 3x10 L Bridge with ball 2x15 Hooklying clamshell 3x15 blue band STS 3x10 L back low table no UE Cybex single leg press 3x10 40# L Standing hip abd 2x10 25# Standing hip flex 2x10 12.5# Cybex single leg press 3x8 40# L Supine SLR 3x10 L Supine heel slide 2x15 L  [facility] Adult PT Treatment:  DATE: 12/27/23 NuStep lvl 6 LE only x 5 minutes Standing hip abd 2x10 25# Standing hip flex 2x10 12.5# Cybex single leg press 3x8 40# L Supine SLR 3x10 L Supine heel slide 2x15 L  [facility] Adult PT Treatment:                                                DATE: 12/25/23 Rec Bike L2 x 5 minutes  Standing hip flexion 2# 10 x 2 each ( 1 set knee extended) Standing hp abdct 2# x 10 each  Standing hip ext 2@ x 10 each  Leg press 45# 20 x 2 Supine SLR 2x10 L Supine heel slide x 10 with slide board  Bridge with ball squeeze 3 x 10  Unable to supine march  30 sec STS 13 reps low mat without UE HMP to left thigh x 10 minutes   [facility] Adult PT Treatment:                                                DATE: 12/20/2023 NuStep lvl 5 LE only x 5 min for functional activity tolerance Standing march 2x10 2# L Single leg press 3x10 45# L Supine SLR 3x10 L Supine march - unable  Bridge with ball 3x10 STS L foot back 2x10 - no UE TKE with GTB 2x10 L Step up 8in L stance 2x10 fwd - no UE Wall squat 2x10   [facility] Adult PT Treatment:                                                DATE: 12/18/2023 NuStep lvl 5 LE only x 5 min for functional activity tolerance Seated march x 10 each  Supine SLR 2x10 L Bridge blue band 2x15 Bridge with ball squeeze 10 x 2  Hooklying clamshell 2x15 blue  band STS L foot back 2x10 - no UE Standing march  Heel raises  LAQ 2x10 L 5# with ball squeeze  Leg press single 3x10 L only 35#     PATIENT EDUCATION:  Education details: eval findings, LEFS, HEP, POC Person educated: Patient Education method: Explanation, Demonstration, and Handouts Education comprehension: verbalized understanding and returned demonstration  HOME EXERCISE PROGRAM: Access Code: [email or web address] URL: [email or web address] Date: 11/20/2023 Prepared by: Alm Kingdom  Exercises - Supine Quad Set  - 1 x daily - 7 x weekly - 2 sets - 10 reps - 5 sec hold - Active Straight Leg Raise with Quad Set (Mirrored)  - 1 x daily - 7 x weekly - 3 sets - 10 reps - Supine Heel Slide (Mirrored)  - 1 x daily - 7 x weekly - 3 sets - 10 reps - 3 sec hold - Sidelying Hip Abduction  - 1 x daily - 7 x weekly - 2-3 sets - 10 reps - Sit to Stand Without Arm Support  - 1 x daily - 7 x weekly - 2-3 sets - 10 reps  ASSESSMENT:  CLINICAL IMPRESSION: Pt reports 0/10 pain on arrival and compliance with HEP, attending OT and gym. Saw MD who recommended continued PT. Still  having difficulty with left hip flexion strength and climbing stairs. Today we worked on hip flexion step taps and stair climbing again. She did well but with increased thigh pain. She is most limited by pain during hip flexion AROM. Pain level decreasing.   EVAL: Patient is a 58 y.o. F who was seen today for physical therapy evaluation and treatment for continued L LE weakness s/p lumbar laminectomy performed on 09/22/23. Physical findings are consistent with surgery and recovery timeline as pt demonstrates decrease in L LE strength and general decrease in functional mobility. LEFS scores shows she is operating below PLOF for home ADLs and higher level mobility. Pt would benefit from skilled PT services working on improving LE strength and mobility post operatively in order to improve function and decrease L thigh pain.    OBJECTIVE IMPAIRMENTS: Abnormal gait, decreased activity tolerance, decreased balance, decreased endurance, decreased mobility, difficulty walking, decreased ROM, decreased strength, and pain   ACTIVITY LIMITATIONS: standing, squatting, stairs, transfers, and locomotion level  PARTICIPATION LIMITATIONS: meal prep, cleaning, driving, shopping, community activity, and yard work  PERSONAL FACTORS: Time since onset of injury/illness/exacerbation and 3+ comorbidities: L THA, Lumbar decompression, HTN are also affecting patient's functional outcome.   REHAB POTENTIAL: Excellent  CLINICAL DECISION MAKING: Stable/uncomplicated  EVALUATION COMPLEXITY: Low   GOALS: Goals reviewed with patient? No  SHORT TERM GOALS: Target date: 12/11/2023   Pt will be compliant and knowledgeable with initial HEP for improved comfort and carryover Baseline: initial HEP given  Goal status: MET  2.  Pt will self report L LE pain no greater than 5/10 for improved comfort and functional ability Baseline: 8/10 at worst 12/11/23: 7/10 now that I have started coughing  12/25/23: 5/10 at worst  Goal status: MET  LONG TERM GOALS: Target date: 01/15/2024   Pt will improve LEFS to no less than 52/80 as proxy for functional improvement with home ADLs and higher level community activity Baseline: 42/80 01/03/2024: 45/80 Goal status: IN PROGRESS   2.  Pt will self report L LE pain no greater than 1-2/10 for improved comfort and functional ability Baseline: 8/10 at worst 01/03/2024: 7/10 at worst 01/15/24: 5/10 Goal status: IN PROGRESS   3.  Pt will increase 30 Second Sit to Stand rep count to no less than 15 reps for improved balance, strength, and functional mobility Baseline: 11 reps  12/25/23: 13 reps  01/03/2024: 13 reps Goal status: ONGOING   4.  Pt will improve L hip flexion MMT to no less than 4/5 for improved functional mobility and decreased pain Baseline: 2+/5 01/03/2024: 3/5 Goal status: ING  PROGRESS  5.  Pt will improve L active hip flexion in supine to no less than 90 degrees for improved functional mobility Baseline: 45 degrees Goal status: IN PROGRESS   PLAN:  PT FREQUENCY: 2x/week  PT DURATION: 8 weeks  PLANNED INTERVENTIONS: [id number]- PT Re-evaluation, 97110-Therapeutic exercises, 97530- Therapeutic activity, [id number]- Neuromuscular re-education, 97535- Self Care, [id number]- Manual therapy, [id number]- Gait training, [phone]- Electrical stimulation (unattended), [phone]- Electrical stimulation (manual), 20560 (1-2 muscles), 20561 (3+ muscles)- Dry Needling, Patient/Family education, Cryotherapy, and Moist heat  PLAN FOR NEXT SESSION: assess HEP response, LE strengthening with emphasis on L hip flexion  Harlene Persons, PTA 01/15/24 11:42 AM Phone: [phone] Fax: [phone]

## 2024-01-17 ENCOUNTER — Ambulatory Visit: Admitting: Occupational Therapy

## 2024-01-17 ENCOUNTER — Encounter: Payer: Self-pay | Admitting: Occupational Therapy

## 2024-01-17 ENCOUNTER — Ambulatory Visit: Admitting: Physical Therapy

## 2024-01-17 ENCOUNTER — Encounter: Payer: Self-pay | Admitting: Physical Therapy

## 2024-01-17 DIAGNOSIS — R278 Other lack of coordination: Secondary | ICD-10-CM

## 2024-01-17 DIAGNOSIS — M6281 Muscle weakness (generalized): Secondary | ICD-10-CM

## 2024-01-17 DIAGNOSIS — M25641 Stiffness of right hand, not elsewhere classified: Secondary | ICD-10-CM

## 2024-01-17 DIAGNOSIS — R262 Difficulty in walking, not elsewhere classified: Secondary | ICD-10-CM

## 2024-01-17 DIAGNOSIS — R208 Other disturbances of skin sensation: Secondary | ICD-10-CM

## 2024-01-17 DIAGNOSIS — R600 Localized edema: Secondary | ICD-10-CM

## 2024-01-17 DIAGNOSIS — M25552 Pain in left hip: Secondary | ICD-10-CM | POA: Diagnosis not present

## 2024-01-17 DIAGNOSIS — R2689 Other abnormalities of gait and mobility: Secondary | ICD-10-CM

## 2024-01-17 NOTE — Therapy (Signed)
 OUTPATIENT OCCUPATIONAL THERAPY ORTHO TREATMENT  Patient Name: Darlene Crosby MRN: [id number] DOB:07-05-1965, 58 y.o., female Today's Date: 01/17/2024  PCP: Jaycee Greig PARAS, NP  REFERRING PROVIDER: Arlinda Buster, MD   END OF SESSION:  OT End of Session - 01/17/24 1236     Visit Number 3    Number of Visits 16    Date for Recertification  03/04/24    Authorization Type [employer] Dual complete - covered 100%    Progress Note Due on Visit 10    OT Start Time 1232    OT Stop Time 1315    OT Time Calculation (min) 43 min    Activity Tolerance Patient tolerated treatment well    Behavior During Therapy WFL for tasks assessed/performed          Past Medical History:  Diagnosis Date   Asthma    followed by pcp   Bronchitis    chronic   Hypertension    OA (osteoarthritis)    left knee, right shoulder, left hip   Pneumonia    a couple years ago per pt on 09/15/21   Seasonal allergies    Wears glasses    Past Surgical History:  Procedure Laterality Date   ANTERIOR HIP REVISION Left 06/06/2018   Procedure: LEFT HIP WOUND DEHISCENCE POSSIBLE HEAD AND LINER EXCHANGE;  Surgeon: Fidel Rogue, MD;  Location: [facility];  Service: Orthopedics;  Laterality: Left;   COLONOSCOPY  2015   DECOMPRESSIVE LUMBAR LAMINECTOMY LEVEL 2 N/A 09/22/2023   Procedure: DECOMPRESSIVE LUMBAR LAMINECTOMY LEVEL 2;  Surgeon: Georgina Ozell LABOR, MD;  Location: [facility];  Service: Orthopedics;  Laterality: N/A;  Lumbar 2 and Lumbar 3 segmental laminectomies with partial medial facetectomies   DIAGNOSTIC LAPAROSCOPY     tubal preg-took ovary and tube   LAPAROSCOPY FOR ECTOPIC PREGNANCY  1990s   LEFT SALPINGECTOMY AND RIGHT OOPHORECTOMY FOR ABNORMALITY   LUMBAR LAMINECTOMY N/A 09/20/2021   Procedure: L3-4 AND L4-5 CENTRAL LUMBAR LAMINECTOMIES;  Surgeon: Lucilla Lynwood BRAVO, MD;  Location: [facility];  Service: Orthopedics;  Laterality: N/A;   MASS EXCISION  02/15/2012   Procedure: EXCISION MASS;  Surgeon: Donnice LABOR Robinsons, MD;  Location: [facility];  Service: Orthopedics;  Laterality: Right;  Excision of Right Small Volar Mass   MYELOGRAM     TONSILLECTOMY  age 64   TOTAL HIP ARTHROPLASTY Left 04/25/2018   Procedure: TOTAL HIP ARTHROPLASTY ANTERIOR APPROACH;  Surgeon: Fidel Rogue, MD;  Location: [facility];  Service: Orthopedics;  Laterality: Left;   TRIGGER FINGER RELEASE Right 2018   thumb   WISDOM TOOTH EXTRACTION  age 80   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 04/12/2022   Status post lumbar laminectomy 09/20/2021   Body mass index (BMI) 37.0-37.9, adult 03/14/2019   Lumbar stenosis without neurogenic claudication 03/14/2019   Hyperlipidemia 02/08/2019   Lumbar spondylosis 10/25/2018   Degeneration of lumbar intervertebral disc 10/01/2018   Surgical wound dehiscence 06/06/2018   Osteoarthritis of left hip 04/25/2018   Spinal stenosis of lumbar region 02/23/2018   Pain in left knee 01/10/2018   Trigger thumb of right hand 03/30/2017   Symptomatic mammary hypertrophy 11/28/2014   Dyspnea 03/26/2013   Asthma vs VCD  02/28/2013   Hypertensive disorder 06/07/2012   Pyogenic granuloma 02/15/2012    ONSET DATE: 11/20/23 referral date (surgery 12/07/23)   REFERRING DIAG: M25.541 (ICD-10-CM) -  Arthralgia of metacarpophalangeal joint, right   Note: Needs OT 2 weeks s/p right long finger MCP arthroplasty  SPLINT  Imaging: XR Hand Complete Right Result Date: 12/21/2023 Stable appearance of the long finger MCP region status post prior resection arthroplasty, appropriate spacing is notable between the metacarpal head and proximal phalanx base  THERAPY DIAG:  Stiffness of right hand, not elsewhere classified  Muscle weakness (generalized)  Localized edema  Other disturbances of skin sensation  Other lack of coordination  Rationale for Evaluation and  Treatment: Rehabilitation  SUBJECTIVE:   SUBJECTIVE STATEMENT: No pain today, just stiffness. The splint is making me stiff.  Pt accompanied by: self  PERTINENT HISTORY: Lt THA 2020, decompression of lumbar spline 08/2023 (L2-3)   PRECAUTIONS: Other: per protocol Rt hand, no lifting over 20 lbs d/t back    WEIGHT BEARING RESTRICTIONS: NWB through Rt hand  PAIN:  Are you having pain? No, just stiffness Rt hand  FALLS: Has patient fallen in last 6 months? No  LIVING ENVIRONMENT: Lives with: lives with 79 y.o. grandson Lives in: 1 story home, 1 step to enter Has following equipment at home: Single point cane, Environmental Consultant - 2 wheeled, and bed side commode  PLOF: Independent and on disability d/t hip   PATIENT GOALS: Get use of my Rt hand back  NEXT MD VISIT: 02/01/24  OBJECTIVE:  Note: Objective measures were completed at Evaluation unless otherwise noted.  HAND DOMINANCE: Right  ADLs: Overall ADLs: Mod I for BADLS and IADLS w/ Lt hand and difficulty  FUNCTIONAL OUTCOME MEASURES: 01/09/24: [name]: 52.27% Deficit RUE   UPPER EXTREMITY ROM:   BUE AROM WNLs at shoulders, elbows, and forearms  Active ROM Right eval Left eval  Shoulder flexion    Shoulder abduction    Shoulder adduction    Shoulder extension    Shoulder internal rotation    Shoulder external rotation    Elbow flexion    Elbow extension    Wrist flexion 45   Wrist extension 35   Wrist ulnar deviation    Wrist radial deviation    Wrist pronation    Wrist supination    (Blank rows = not tested)  Active ROM Right eval Left eval  Thumb MCP (0-60)    Thumb IP (0-80)    Thumb Radial abd/add (0-55)     Thumb Palmar abd/add (0-45)     Thumb Opposition to Small Finger     Index MCP (0-90)     Index PIP (0-100)     Index DIP (0-70)      Long MCP (0-90) 54*, -15*     Long PIP (0-100) 75*, 0*     Long DIP (0-70)      Ring MCP (0-90)      Ring PIP (0-100)      Ring DIP (0-70)      Little MCP  (0-90)      Little PIP (0-100)      Little DIP (0-70)      (Blank rows = not tested)  01/17/24: Rt long finger MP joint  = 65*, - 20*  Rt long finger PIP joint = 90*, -10*   UPPER EXTREMITY MMT:   NT   HAND FUNCTION: Grip strength TBD once precautions lifted  COORDINATION: Impaired d/t edema and decreased ROM   SENSATION: Not formally assessed  EDEMA: moderate dorsal hand and in fingers (long finger worse)  COGNITION: Overall cognitive status: Within functional limits for tasks assessed  OBSERVATIONS: Pt  came directly from MD office w/ gauze dressing wrapped around hand.    TREATMENT DATE: 01/17/24                                                                                                                            Fluidotherapy x 10 minutes for Rt wrist and hand to address swelling, and stiffness. No adverse reactions.    Reviewed A/ROM HEP - pt return demo of each w/ min cues for effectiveness.  Reviewed gentle PROM HEP - see pt instructions for details. Pt return demo of each  Reassessed ROM Rt long finger - see above. Pt with improvements in MP and PIP flexion, but increased extensor lag at MP and PIP joint.   Pt performing pen rolling exercise with Sharpie to get intrinsic +, intrinsic -, and composite flexion  Pt  has pain at Rt long finger PIP joint only w/ passive extension 5/10   PATIENT EDUCATION: Education details: see above Person educated: Patient Education method: Programmer, Multimedia, Demonstration, Verbal cues, and Handouts Education comprehension: verbalized understanding, returned demonstration, and verbal cues required  HOME EXERCISE PROGRAM: 01/04/24: A/ROM HEP for Rt hand/wrist 01/09/24: P/ROM HEP, scar massage  GOALS: Goals reviewed with patient? Yes  SHORT TERM GOALS: Target date: 02/03/24  Independent with splint wear and care Baseline: Goal status: MET  2.  Independent with ROM HEP  Baseline:  Goal status: MET  3.  Pt to verbalize  understanding of edema management strategies  Baseline:  Goal status: IN PROGRESS - pt now tolerating tensogrip stockinette  4.  Pt to improve in Rt long finger MP flexion and PIP flexion by 10* or more Baseline: 54*, 75*  Goal status: MET (01/17/24 = 65*, 90*)   5.  Pt to improve Rt long finger MP extension to -5*  Baseline: -15* Goal status: IN PROGRESS   LONG TERM GOALS: Target date: 03/04/24  Independent with updated strengthening HEP  Baseline:  Goal status: INITIAL  2.  Pt to demo ROM Rt hand WFL's to make fist for full grasp/release Baseline:  Goal status: INITIAL  3.  Pt to return to using Rt hand as dominant hand for ADLS and bilateral tasks Baseline:  Goal status: INITIAL  4.  Pt to demo 50% or greater Rt grip strength compared to Lt hand Baseline:  Goal status: INITIAL  5.  Quick Dash to be 20% or less deficit RUE Baseline: 52.27%  Goal status: REVISED   ASSESSMENT:  CLINICAL IMPRESSION: Patient seen today for occupational therapy treatment for s/p Rt long finger MCP arthroplasty on 12/07/23? Due to OA. Pt has improved in MP and PIP flexion however has increased extensor lag at MP and PIP joint. Hx includes lumbar decompression and laminectomy (08/2023) and Lt THA in 2020 (currently seeing ortho P.T.). Patient already has improved since evaluation and has met 2 STG's .   PERFORMANCE DEFICITS: in functional skills including ADLs, IADLs, coordination, dexterity, sensation, edema, ROM, strength, pain, fascial  restrictions, Fine motor control, Gross motor control, decreased knowledge of precautions, and UE functional use.   IMPAIRMENTS: are limiting patient from ADLs, IADLs, leisure, and social participation.   COMORBIDITIES: may have co-morbidities  that affects occupational performance. Patient will benefit from skilled OT to address above impairments and improve overall function.  MODIFICATION OR ASSISTANCE TO COMPLETE EVALUATION: Min-Moderate modification of  tasks or assist with assess necessary to complete an evaluation.  OT OCCUPATIONAL PROFILE AND HISTORY: Detailed assessment: Review of records and additional review of physical, cognitive, psychosocial history related to current functional performance.  CLINICAL DECISION MAKING: Moderate - several treatment options, min-mod task modification necessary  REHAB POTENTIAL: Good  EVALUATION COMPLEXITY: Moderate      PLAN:  OT FREQUENCY: 1-2x/week (may begin at 1x/wk then increase to 2x/wk)  OT DURATION: 8 weeks  PLANNED INTERVENTIONS: 97535 self care/ADL training, 02889 therapeutic exercise, 97530 therapeutic activity, 97140 manual therapy, 97035 ultrasound, 97018 paraffin, 02960 fluidotherapy, 97010 moist heat, 97010 cryotherapy, 97034 contrast bath, 97760 Orthotic Initial, 97763 Orthotic/Prosthetic subsequent, scar mobilization, passive range of motion, compression bandaging, patient/family education, and DME and/or AE instructions  RECOMMENDED OTHER SERVICES: none at this time  CONSULTED AND AGREED WITH PLAN OF CARE: Patient  PLAN FOR NEXT SESSION: fluidotherapy, ? Make pm volar hand based extensor splint for Rt long finger (Teams message sent to Dr. Erwin to see if ok)    Burnard JINNY Roads, OT 01/17/2024, 12:37 PM

## 2024-01-17 NOTE — Therapy (Signed)
 OUTPATIENT PHYSICAL THERAPY TREATMENT   Patient Name: Darlene Crosby MRN: [id number] DOB:1965/10/17, 58 y.o., female Today's Date: 01/17/2024  END OF SESSION:  PT End of Session - 01/17/24 1101     Visit Number 14    Number of Visits 17    Date for Recertification  02/05/24    Authorization Type [employer] Dual Complete    PT Start Time 1100    PT Stop Time 1140    PT Time Calculation (min) 40 min                Past Medical History:  Diagnosis Date   Asthma    followed by pcp   Bronchitis    chronic   Hypertension    OA (osteoarthritis)    left knee, right shoulder, left hip   Pneumonia    a couple years ago per pt on 09/15/21   Seasonal allergies    Wears glasses    Past Surgical History:  Procedure Laterality Date   ANTERIOR HIP REVISION Left 06/06/2018   Procedure: LEFT HIP WOUND DEHISCENCE POSSIBLE HEAD AND LINER EXCHANGE;  Surgeon: Fidel Rogue, MD;  Location: [facility];  Service: Orthopedics;  Laterality: Left;   COLONOSCOPY  2015   DECOMPRESSIVE LUMBAR LAMINECTOMY LEVEL 2 N/A 09/22/2023   Procedure: DECOMPRESSIVE LUMBAR LAMINECTOMY LEVEL 2;  Surgeon: Georgina Ozell LABOR, MD;  Location: [facility];  Service: Orthopedics;  Laterality: N/A;  Lumbar 2 and Lumbar 3 segmental laminectomies with partial medial facetectomies   DIAGNOSTIC LAPAROSCOPY     tubal preg-took ovary and tube   LAPAROSCOPY FOR ECTOPIC PREGNANCY  1990s   LEFT SALPINGECTOMY AND RIGHT OOPHORECTOMY FOR ABNORMALITY   LUMBAR LAMINECTOMY N/A 09/20/2021   Procedure: L3-4 AND L4-5 CENTRAL LUMBAR LAMINECTOMIES;  Surgeon: Lucilla Lynwood BRAVO, MD;  Location: [facility];  Service: Orthopedics;  Laterality: N/A;   MASS EXCISION  02/15/2012   Procedure: EXCISION MASS;  Surgeon: Donnice LABOR Robinsons, MD;  Location: [facility];  Service: Orthopedics;  Laterality: Right;  Excision of Right Small Volar Mass   MYELOGRAM     TONSILLECTOMY  age 32   TOTAL HIP ARTHROPLASTY Left 04/25/2018   Procedure: TOTAL HIP  ARTHROPLASTY ANTERIOR APPROACH;  Surgeon: Fidel Rogue, MD;  Location: [facility];  Service: Orthopedics;  Laterality: Left;   TRIGGER FINGER RELEASE Right 2018   thumb   WISDOM TOOTH EXTRACTION  age 81   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 04/12/2022   Status post lumbar laminectomy 09/20/2021   Body mass index (BMI) 37.0-37.9, adult 03/14/2019   Lumbar stenosis without neurogenic claudication 03/14/2019   Hyperlipidemia 02/08/2019   Lumbar spondylosis 10/25/2018   Degeneration of lumbar intervertebral disc 10/01/2018   Surgical wound dehiscence 06/06/2018   Osteoarthritis of left hip 04/25/2018   Spinal stenosis of lumbar region 02/23/2018   Pain in left knee 01/10/2018   Trigger thumb of right hand 03/30/2017   Symptomatic mammary hypertrophy 11/28/2014   Dyspnea 03/26/2013   Asthma vs VCD  02/28/2013   Hypertensive disorder 06/07/2012   Pyogenic granuloma 02/15/2012    PCP: Lorren Greig PARAS, NP  REFERRING PROVIDER: Georgina Ozell LABOR, MD  REFERRING DIAG: R29.898 (ICD-10-CM) - Weakness of left hip   THERAPY DIAG:  Difficulty in walking, not elsewhere classified  Other abnormalities of gait and mobility  Rationale for Evaluation and Treatment: Rehabilitation  ONSET DATE: 09/22/2023  SUBJECTIVE:   SUBJECTIVE STATEMENT: Pt reports no pain on arrival. Completing HEP. Has difficulty with stairs and active hip flexion, causes pain.  EVAL: Pt presents to PT s/p decompressive lumbar laminectomy performed by Dr. Georgina on 09/22/23. Pt notes that pain is occasionally in L anterior thigh and is especially present when fatigued. Denies sensation change in L LE. Symptoms of pain have resolved really well post surgery. She is frustrated by her continued inability to flex L hip in seated position.    PERTINENT HISTORY: L THA, Lumbar decompression, HTN  PAIN:   Are you having pain?  Yes: NPRS scale: 0/10 Worst: 0/10 Pain location: L anterior thigh Pain description: sore Aggravating factors: squats, stairs Relieving factors: heat  PRECAUTIONS: None  RED FLAGS: None   WEIGHT BEARING RESTRICTIONS: No  FALLS:  Has patient fallen in last 6 months? No  LIVING ENVIRONMENT: Lives with: lives with their family Lives in: House/apartment Stairs: 1 STE Has following equipment at home: Single point cane  PLOF: Independent  PATIENT GOALS: improve L LE strength, get back to being able to do line dancing without limitation   NEXT MD VISIT: 12/18/2023  OBJECTIVE:  Note: Objective measures were completed at Evaluation unless otherwise noted.  DIAGNOSTIC FINDINGS: N/A  PATIENT SURVEYS:  LEFS  Extreme difficulty/unable (0), Quite a bit of difficulty (1), Moderate difficulty (2), Little difficulty (3), No difficulty (4) Survey date:  11/20/2023 01/03/2024  Any of your usual work, housework or school activities 2   2. Usual hobbies, recreational or sporting activities 3   3. Getting into/out of the bath 3   4. Walking between rooms 4   5. Putting on socks/shoes 3   6. Squatting  1   7. Lifting an object, like a bag of groceries from the floor 1   8. Performing light activities around your home 2   9. Performing heavy activities around your home 3   10. Getting into/out of a car 1   11. Walking 2 blocks 4   12. Walking 1 mile 4   13. Going up/down 10 stairs (1 flight) 3   14. Standing for 1 hour 3   15.  sitting for 1 hour 4   16. Running on even ground 0   17. Running on uneven ground 0   18. Making sharp turns while running fast 0   19. Hopping  1   20. Rolling over in bed 1   Score total:  42/80 45/80     COGNITION: Overall cognitive status: Within functional limits for tasks assessed     SENSATION: Light touch: Impaired   POSTURE: increased lumbar lordosis and flexed trunk   PALPATION: Slight TTP to proximal L  thigh  LOWER EXTREMITY ROM:  Active ROM Right eval Left eval  Hip flexion 110 45  Hip extension    Hip abduction    Hip adduction    Hip internal rotation    Hip external rotation    Knee flexion    Knee extension    Ankle dorsiflexion    Ankle plantarflexion    Ankle inversion    Ankle eversion     (Blank rows = not tested)  LOWER EXTREMITY MMT:  MMT Right eval Left eval Left 01/03/2024  Hip flexion 4 2+ 3  Hip extension     Hip abduction 4 4   Hip adduction     Hip internal rotation     Hip external rotation     Knee flexion  5 5   Knee extension 5 5   Ankle dorsiflexion     Ankle plantarflexion     Ankle inversion     Ankle eversion      (Blank rows = not tested)  LOWER EXTREMITY SPECIAL TESTS:  DNT  FUNCTIONAL TESTS:  30 Second Sit to Stand: 11 reps 12/25/23: sit to stand: 13 reps   GAIT: Distance walked: 87ft Assistive device utilized: None Level of assistance: Complete Independence Comments: antalgic gait L LE   TREATMENT: [facility] Adult PT Treatment:                                                DATE: 01/17/24 Therapeutic Exercise: Nustep L6 LE only x 8 minutes Left heel slide x 10 Left SLR 2 x 10  Bridge 15, x 10 SLS on foam with 2 way hip 10 x 2 6 inch step taps x 10 each  6 inch step up x 12 Staggered STS 10 x 2 Side hip abduction 10 x 2     [facility] Adult PT Treatment:                                                DATE: 01/15/24 Therapeutic Exercise: Nustep L6 LE only x 8 minutes Left heel slide x 10 Left SLR 2 x 10  Bridge 15 SLS on foam with 2 way hip 10 x 2 6 inch step taps x 10 each  6 inch step up x 12 Side hip abduction 10 x 2  SL Leg press 60# x 20     [facility] Adult PT Treatment:                                                DATE: 01/10/24 Therapeutic Exercise: Nustep 8 min L6 LE only  Stair negotiation 6 inch steps  Step taps 4 inch x 10, 6 inch x 10 SLS on foam with 3 way hip 10 x 2 SL leg press 20 x 40#, 20 x 60#   STS with LLE back 10 x 3    [facility] Adult PT Treatment:                                                DATE: 01/08/24 Therapeutic Exercise: Nustep 8 min L6 LE only  Cybex hip 25 # hip abdct x 10 each  Cybe x hip ext 12.5 # x 10 each Cybex hip flexion 12.5 # x 10 each  SL leg press 4 x 10 40#  STS with LLE back 10 x 3  Supine SLR 3x10 L  Bridge 10 x 2 SL hip clam x 10    [facility] Adult PT Treatment:  DATE: 01/03/24 NuStep lvl 6 LE only x 5 minutes Assessment of tests/measures, goals, and outcomes for progress note Supine SLR 3x10 L Bridge with ball 2x15 Hooklying clamshell 3x15 blue band STS 3x10 L back low table no UE Cybex single leg press 3x10 40# L Standing hip abd 2x10 25# Standing hip flex 2x10 12.5# Cybex single leg press 3x8 40# L Supine SLR 3x10 L Supine heel slide 2x15 L  [facility] Adult PT Treatment:                                                DATE: 12/27/23 NuStep lvl 6 LE only x 5 minutes Standing hip abd 2x10 25# Standing hip flex 2x10 12.5# Cybex single leg press 3x8 40# L Supine SLR 3x10 L Supine heel slide 2x15 L        PATIENT EDUCATION:  Education details: eval findings, LEFS, HEP, POC Person educated: Patient Education method: Explanation, Demonstration, and Handouts Education comprehension: verbalized understanding and returned demonstration  HOME EXERCISE PROGRAM: Access Code: [email or web address] URL: [email or web address] Date: 11/20/2023 Prepared by: Alm Kingdom  Exercises - Supine Quad Set  - 1 x daily - 7 x weekly - 2 sets - 10 reps - 5 sec hold - Active Straight Leg Raise with Quad Set (Mirrored)  - 1 x daily - 7 x weekly - 3 sets - 10 reps - Supine Heel Slide (Mirrored)  - 1 x daily - 7 x weekly - 3 sets - 10 reps - 3 sec hold - Sidelying Hip Abduction  - 1 x daily - 7 x weekly - 2-3 sets - 10 reps - Sit to Stand Without Arm Support  - 1 x daily - 7 x weekly - 2-3 sets - 10  reps  ASSESSMENT:  CLINICAL IMPRESSION: Pt reports 0/10 pain on arrival and compliance with HEP, attending OT and gym. Saw MD who recommended continued PT. Still having difficulty with left hip flexion strength and climbing stairs. Today we worked on hip flexion step taps and stair climbing again. She did well but with increased thigh pain. She is most limited by pain during hip flexion AROM. Pain level decreasing overall.   EVAL: Patient is a 58 y.o. F who was seen today for physical therapy evaluation and treatment for continued L LE weakness s/p lumbar laminectomy performed on 09/22/23. Physical findings are consistent with surgery and recovery timeline as pt demonstrates decrease in L LE strength and general decrease in functional mobility. LEFS scores shows she is operating below PLOF for home ADLs and higher level mobility. Pt would benefit from skilled PT services working on improving LE strength and mobility post operatively in order to improve function and decrease L thigh pain.   OBJECTIVE IMPAIRMENTS: Abnormal gait, decreased activity tolerance, decreased balance, decreased endurance, decreased mobility, difficulty walking, decreased ROM, decreased strength, and pain   ACTIVITY LIMITATIONS: standing, squatting, stairs, transfers, and locomotion level  PARTICIPATION LIMITATIONS: meal prep, cleaning, driving, shopping, community activity, and yard work  PERSONAL FACTORS: Time since onset of injury/illness/exacerbation and 3+ comorbidities: L THA, Lumbar decompression, HTN are also affecting patient's functional outcome.   REHAB POTENTIAL: Excellent  CLINICAL DECISION MAKING: Stable/uncomplicated  EVALUATION COMPLEXITY: Low   GOALS: Goals reviewed with patient? No  SHORT TERM GOALS: Target date: 12/11/2023   Pt will be compliant and knowledgeable with initial HEP for improved comfort  and carryover Baseline: initial HEP given  Goal status: MET  2.  Pt will self report L LE pain  no greater than 5/10 for improved comfort and functional ability Baseline: 8/10 at worst 12/11/23: 7/10 now that I have started coughing  12/25/23: 5/10 at worst  Goal status: MET  LONG TERM GOALS: Target date: 01/15/2024   Pt will improve LEFS to no less than 52/80 as proxy for functional improvement with home ADLs and higher level community activity Baseline: 42/80 01/03/2024: 45/80 Goal status: IN PROGRESS   2.  Pt will self report L LE pain no greater than 1-2/10 for improved comfort and functional ability Baseline: 8/10 at worst 01/03/2024: 7/10 at worst 01/15/24: 5/10 Goal status: IN PROGRESS   3.  Pt will increase 30 Second Sit to Stand rep count to no less than 15 reps for improved balance, strength, and functional mobility Baseline: 11 reps  12/25/23: 13 reps  01/03/2024: 13 reps Goal status: ONGOING   4.  Pt will improve L hip flexion MMT to no less than 4/5 for improved functional mobility and decreased pain Baseline: 2+/5 01/03/2024: 3/5 Goal status: ING PROGRESS  5.  Pt will improve L active hip flexion in supine to no less than 90 degrees for improved functional mobility Baseline: 45 degrees Goal status: IN PROGRESS   PLAN:  PT FREQUENCY: 2x/week  PT DURATION: 8 weeks  PLANNED INTERVENTIONS: 97164- PT Re-evaluation, 97110-Therapeutic exercises, 97530- Therapeutic activity, [id number]- Neuromuscular re-education, 97535- Self Care, 02859- Manual therapy, [id number]- Gait training, [phone]- Electrical stimulation (unattended), [id number]- Electrical stimulation (manual), 20560 (1-2 muscles), 20561 (3+ muscles)- Dry Needling, Patient/Family education, Cryotherapy, and Moist heat  PLAN FOR NEXT SESSION: assess HEP response, LE strengthening with emphasis on L hip flexion  Harlene Persons, PTA 01/17/24 11:40 AM Phone: [phone] Fax: [phone]

## 2024-01-19 ENCOUNTER — Telehealth: Payer: Self-pay

## 2024-01-19 NOTE — Telephone Encounter (Signed)
 Copied from CRM #[id number]. Topic: General - Other >> Jan 19, 2024  2:21 PM Selinda RAMAN wrote: Reason for CRM: Bethany with [facility] called in to confirm the fax number which she did and give the heads up that she will be sending over a fax requesting refills for the patient on her levocetirizine (XYZAL ) 5 MG tablet and amLODipine  (NORVASC ) 5 MG tablet. She states if there are any questions please call [phone]. Please assist patient further

## 2024-01-19 NOTE — Telephone Encounter (Signed)
 Noted. Please send medication refills request once update pharmacy information received.

## 2024-01-22 ENCOUNTER — Encounter: Payer: Self-pay | Admitting: Physical Therapy

## 2024-01-22 ENCOUNTER — Ambulatory Visit: Admitting: Physical Therapy

## 2024-01-22 ENCOUNTER — Other Ambulatory Visit: Payer: Self-pay | Admitting: Family

## 2024-01-22 DIAGNOSIS — J45909 Unspecified asthma, uncomplicated: Secondary | ICD-10-CM

## 2024-01-22 DIAGNOSIS — M25552 Pain in left hip: Secondary | ICD-10-CM | POA: Diagnosis not present

## 2024-01-22 DIAGNOSIS — R262 Difficulty in walking, not elsewhere classified: Secondary | ICD-10-CM

## 2024-01-22 MED ORDER — ALBUTEROL SULFATE (2.5 MG/3ML) 0.083% IN NEBU: 2.5000 mg | INHALATION_SOLUTION | Freq: Four times a day (QID) | RESPIRATORY_TRACT | 2 refills | Status: AC | PRN

## 2024-01-22 NOTE — Telephone Encounter (Signed)
 Complete

## 2024-01-22 NOTE — Therapy (Signed)
 OUTPATIENT PHYSICAL THERAPY TREATMENT   Patient Name: Darlene Crosby MRN: [id number] DOB:03-Sep-1965, 58 y.o., female Today's Date: 01/22/2024  END OF SESSION:  PT End of Session - 01/22/24 1053     Visit Number 15    Number of Visits 17    Date for Recertification  02/05/24    Authorization Type [employer] Dual Complete    PT Start Time 1050    PT Stop Time 1128    PT Time Calculation (min) 38 min                Past Medical History:  Diagnosis Date   Asthma    followed by pcp   Bronchitis    chronic   Hypertension    OA (osteoarthritis)    left knee, right shoulder, left hip   Pneumonia    a couple years ago per pt on 09/15/21   Seasonal allergies    Wears glasses    Past Surgical History:  Procedure Laterality Date   ANTERIOR HIP REVISION Left 06/06/2018   Procedure: LEFT HIP WOUND DEHISCENCE POSSIBLE HEAD AND LINER EXCHANGE;  Surgeon: Fidel Rogue, MD;  Location: [facility];  Service: Orthopedics;  Laterality: Left;   COLONOSCOPY  2015   DECOMPRESSIVE LUMBAR LAMINECTOMY LEVEL 2 N/A 09/22/2023   Procedure: DECOMPRESSIVE LUMBAR LAMINECTOMY LEVEL 2;  Surgeon: Georgina Ozell LABOR, MD;  Location: [facility];  Service: Orthopedics;  Laterality: N/A;  Lumbar 2 and Lumbar 3 segmental laminectomies with partial medial facetectomies   DIAGNOSTIC LAPAROSCOPY     tubal preg-took ovary and tube   LAPAROSCOPY FOR ECTOPIC PREGNANCY  1990s   LEFT SALPINGECTOMY AND RIGHT OOPHORECTOMY FOR ABNORMALITY   LUMBAR LAMINECTOMY N/A 09/20/2021   Procedure: L3-4 AND L4-5 CENTRAL LUMBAR LAMINECTOMIES;  Surgeon: Lucilla Lynwood BRAVO, MD;  Location: [facility];  Service: Orthopedics;  Laterality: N/A;   MASS EXCISION  02/15/2012   Procedure: EXCISION MASS;  Surgeon: Donnice LABOR Robinsons, MD;  Location: [facility];  Service: Orthopedics;  Laterality: Right;  Excision of Right Small Volar Mass   MYELOGRAM     TONSILLECTOMY  age 57   TOTAL HIP ARTHROPLASTY Left 04/25/2018   Procedure: TOTAL HIP  ARTHROPLASTY ANTERIOR APPROACH;  Surgeon: Fidel Rogue, MD;  Location: [facility];  Service: Orthopedics;  Laterality: Left;   TRIGGER FINGER RELEASE Right 2018   thumb   WISDOM TOOTH EXTRACTION  age 57   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 04/12/2022   Status post lumbar laminectomy 09/20/2021   Body mass index (BMI) 37.0-37.9, adult 03/14/2019   Lumbar stenosis without neurogenic claudication 03/14/2019   Hyperlipidemia 02/08/2019   Lumbar spondylosis 10/25/2018   Degeneration of lumbar intervertebral disc 10/01/2018   Surgical wound dehiscence 06/06/2018   Osteoarthritis of left hip 04/25/2018   Spinal stenosis of lumbar region 02/23/2018   Pain in left knee 01/10/2018   Trigger thumb of right hand 03/30/2017   Symptomatic mammary hypertrophy 11/28/2014   Dyspnea 03/26/2013   Asthma vs VCD  02/28/2013   Hypertensive disorder 06/07/2012   Pyogenic granuloma 02/15/2012    PCP: Lorren Greig PARAS, NP  REFERRING PROVIDER: Georgina Ozell LABOR, MD  REFERRING DIAG: R29.898 (ICD-10-CM) - Weakness of left hip   THERAPY DIAG:  Difficulty in walking, not elsewhere classified  Pain in left hip  Rationale for Evaluation and Treatment: Rehabilitation  ONSET DATE: 09/22/2023  SUBJECTIVE:  SUBJECTIVE STATEMENT: Pt reports no pain on arrival. Completing HEP. Has difficulty with stairs and active hip flexion, causes pain.  EVAL: Pt presents to PT s/p decompressive lumbar laminectomy performed by Dr. Georgina on 09/22/23. Pt notes that pain is occasionally in L anterior thigh and is especially present when fatigued. Denies sensation change in L LE. Symptoms of pain have resolved really well post surgery. She is frustrated by her continued inability to flex L hip in seated position.    PERTINENT HISTORY: L THA, Lumbar decompression, HTN  PAIN:  Are you having pain?   Yes: NPRS scale: 0/10 Worst: 0/10 Pain location: L anterior thigh Pain description: sore Aggravating factors: squats, stairs Relieving factors: heat  PRECAUTIONS: None  RED FLAGS: None   WEIGHT BEARING RESTRICTIONS: No  FALLS:  Has patient fallen in last 6 months? No  LIVING ENVIRONMENT: Lives with: lives with their family Lives in: House/apartment Stairs: 1 STE Has following equipment at home: Single point cane  PLOF: Independent  PATIENT GOALS: improve L LE strength, get back to being able to do line dancing without limitation   NEXT MD VISIT: 12/18/2023  OBJECTIVE:  Note: Objective measures were completed at Evaluation unless otherwise noted.  DIAGNOSTIC FINDINGS: N/A  PATIENT SURVEYS:  LEFS  Extreme difficulty/unable (0), Quite a bit of difficulty (1), Moderate difficulty (2), Little difficulty (3), No difficulty (4) Survey date:  11/20/2023 01/03/2024  Any of your usual work, housework or school activities 2   2. Usual hobbies, recreational or sporting activities 3   3. Getting into/out of the bath 3   4. Walking between rooms 4   5. Putting on socks/shoes 3   6. Squatting  1   7. Lifting an object, like a bag of groceries from the floor 1   8. Performing light activities around your home 2   9. Performing heavy activities around your home 3   10. Getting into/out of a car 1   11. Walking 2 blocks 4   12. Walking 1 mile 4   13. Going up/down 10 stairs (1 flight) 3   14. Standing for 1 hour 3   15.  sitting for 1 hour 4   16. Running on even ground 0   17. Running on uneven ground 0   18. Making sharp turns while running fast 0   19. Hopping  1   20. Rolling over in bed 1   Score total:  42/80 45/80     COGNITION: Overall cognitive status: Within functional limits for tasks assessed     SENSATION: Light touch: Impaired   POSTURE: increased lumbar lordosis and flexed trunk   PALPATION: Slight TTP to proximal L thigh  LOWER EXTREMITY  ROM:  Active ROM Right eval Left eval  Hip flexion 110 45  Hip extension    Hip abduction    Hip adduction    Hip internal rotation    Hip external rotation    Knee flexion    Knee extension    Ankle dorsiflexion    Ankle plantarflexion    Ankle inversion    Ankle eversion     (Blank rows = not tested)  LOWER EXTREMITY MMT:  MMT Right eval Left eval Left 01/03/2024  Hip flexion 4 2+ 3  Hip extension     Hip abduction 4 4   Hip adduction     Hip internal rotation     Hip external rotation     Knee flexion 5 5  Knee extension 5 5   Ankle dorsiflexion     Ankle plantarflexion     Ankle inversion     Ankle eversion      (Blank rows = not tested)  LOWER EXTREMITY SPECIAL TESTS:  DNT  FUNCTIONAL TESTS:  30 Second Sit to Stand: 11 reps 12/25/23: sit to stand: 13 reps   GAIT: Distance walked: 67ft Assistive device utilized: None Level of assistance: Complete Independence Comments: antalgic gait L LE   TREATMENT: [facility] Adult PT Treatment:                                                DATE: 01/22/24 Therapeutic Exercise: Nustep L6 LE only x 8 minutes Left heel slide x 15 Left SLR 1 x 15 Bridge 15 x 2  SLS on foam with 2 way hip 15 each  6 inch step taps x 15 each  6 inch step up x 15 Staggered STS x 15  Side hip abduction  x 15  Side clam x 15     [facility] Adult PT Treatment:                                                DATE: 01/17/24 Therapeutic Exercise: Nustep L6 LE only x 8 minutes Left heel slide x 10 Left SLR 2 x 10  Bridge 15, x 10 SLS on foam with 2 way hip 10 x 2 6 inch step taps x 10 each  6 inch step up x 12 Staggered STS 10 x 2 Side hip abduction 10 x 2     [facility] Adult PT Treatment:                                                DATE: 01/15/24 Therapeutic Exercise: Nustep L6 LE only x 8 minutes Left heel slide x 10 Left SLR 2 x 10  Bridge 15 SLS on foam with 2 way hip 10 x 2 6 inch step taps x 10 each  6 inch step up x 12 Side  hip abduction 10 x 2  SL Leg press 60# x 20     [facility] Adult PT Treatment:                                                DATE: 01/10/24 Therapeutic Exercise: Nustep 8 min L6 LE only  Stair negotiation 6 inch steps  Step taps 4 inch x 10, 6 inch x 10 SLS on foam with 3 way hip 10 x 2 SL leg press 20 x 40#, 20 x 60#  STS with LLE back 10 x 3    [facility] Adult PT Treatment:                                                DATE: 01/08/24 Therapeutic Exercise:  Nustep 8 min L6 LE only  Cybex hip 25 # hip abdct x 10 each  Cybe x hip ext 12.5 # x 10 each Cybex hip flexion 12.5 # x 10 each  SL leg press 4 x 10 40#  STS with LLE back 10 x 3  Supine SLR 3x10 L  Bridge 10 x 2 SL hip clam x 10    [facility] Adult PT Treatment:                                                DATE: 01/03/24 NuStep lvl 6 LE only x 5 minutes Assessment of tests/measures, goals, and outcomes for progress note Supine SLR 3x10 L Bridge with ball 2x15 Hooklying clamshell 3x15 blue band STS 3x10 L back low table no UE Cybex single leg press 3x10 40# L Standing hip abd 2x10 25# Standing hip flex 2x10 12.5# Cybex single leg press 3x8 40# L Supine SLR 3x10 L Supine heel slide 2x15 L  [facility] Adult PT Treatment:                                                DATE: 12/27/23 NuStep lvl 6 LE only x 5 minutes Standing hip abd 2x10 25# Standing hip flex 2x10 12.5# Cybex single leg press 3x8 40# L Supine SLR 3x10 L Supine heel slide 2x15 L        PATIENT EDUCATION:  Education details: eval findings, LEFS, HEP, POC Person educated: Patient Education method: Explanation, Demonstration, and Handouts Education comprehension: verbalized understanding and returned demonstration  HOME EXERCISE PROGRAM: Access Code: [email or web address] URL: [email or web address] Date: 11/20/2023 Prepared by: Alm Kingdom  Exercises - Supine Quad Set  - 1 x daily - 7 x weekly - 2 sets - 10 reps - 5 sec hold - Active Straight Leg Raise  with Quad Set (Mirrored)  - 1 x daily - 7 x weekly - 3 sets - 10 reps - Supine Heel Slide (Mirrored)  - 1 x daily - 7 x weekly - 3 sets - 10 reps - 3 sec hold - Sidelying Hip Abduction  - 1 x daily - 7 x weekly - 2-3 sets - 10 reps - Sit to Stand Without Arm Support  - 1 x daily - 7 x weekly - 2-3 sets - 10 reps  ASSESSMENT:  CLINICAL IMPRESSION: Pt reports 0/10 pain on arrival and compliance with HEP, attending OT and gym. Saw MD who recommended continued PT. Still having difficulty with left hip flexion strength and climbing stairs. Today we worked on hip flexion step taps and stair climbing again. She did well but with increased thigh pain. Also is having some anterior hip numbness. She is most limited by pain during hip flexion AROM. Pain level decreasing overall.   EVAL: Patient is a 58 y.o. F who was seen today for physical therapy evaluation and treatment for continued L LE weakness s/p lumbar laminectomy performed on 09/22/23. Physical findings are consistent with surgery and recovery timeline as pt demonstrates decrease in L LE strength and general decrease in functional mobility. LEFS scores shows she is operating below PLOF for home ADLs and higher level mobility. Pt would benefit from skilled PT services working on improving LE strength  and mobility post operatively in order to improve function and decrease L thigh pain.   OBJECTIVE IMPAIRMENTS: Abnormal gait, decreased activity tolerance, decreased balance, decreased endurance, decreased mobility, difficulty walking, decreased ROM, decreased strength, and pain   ACTIVITY LIMITATIONS: standing, squatting, stairs, transfers, and locomotion level  PARTICIPATION LIMITATIONS: meal prep, cleaning, driving, shopping, community activity, and yard work  PERSONAL FACTORS: Time since onset of injury/illness/exacerbation and 3+ comorbidities: L THA, Lumbar decompression, HTN are also affecting patient's functional outcome.   REHAB POTENTIAL:  Excellent  CLINICAL DECISION MAKING: Stable/uncomplicated  EVALUATION COMPLEXITY: Low   GOALS: Goals reviewed with patient? No  SHORT TERM GOALS: Target date: 12/11/2023   Pt will be compliant and knowledgeable with initial HEP for improved comfort and carryover Baseline: initial HEP given  Goal status: MET  2.  Pt will self report L LE pain no greater than 5/10 for improved comfort and functional ability Baseline: 8/10 at worst 12/11/23: 7/10 now that I have started coughing  12/25/23: 5/10 at worst  Goal status: MET  LONG TERM GOALS: Target date: 01/15/2024   Pt will improve LEFS to no less than 52/80 as proxy for functional improvement with home ADLs and higher level community activity Baseline: 42/80 01/03/2024: 45/80 Goal status: IN PROGRESS   2.  Pt will self report L LE pain no greater than 1-2/10 for improved comfort and functional ability Baseline: 8/10 at worst 01/03/2024: 7/10 at worst 01/15/24: 5/10 Goal status: IN PROGRESS   3.  Pt will increase 30 Second Sit to Stand rep count to no less than 15 reps for improved balance, strength, and functional mobility Baseline: 11 reps  12/25/23: 13 reps  01/03/2024: 13 reps Goal status: ONGOING   4.  Pt will improve L hip flexion MMT to no less than 4/5 for improved functional mobility and decreased pain Baseline: 2+/5 01/03/2024: 3/5 Goal status: PROGRESSING  5.  Pt will improve L active hip flexion in supine to no less than 90 degrees for improved functional mobility Baseline: 45 degrees Goal status: IN PROGRESS   PLAN:  PT FREQUENCY: 2x/week  PT DURATION: 8 weeks  PLANNED INTERVENTIONS: [id number]- PT Re-evaluation, 97110-Therapeutic exercises, 97530- Therapeutic activity, [id number]- Neuromuscular re-education, 97535- Self Care, [id number]- Manual therapy, [id number]- Gait training, [phone]- Electrical stimulation (unattended), [phone]- Electrical stimulation (manual), 20560 (1-2 muscles), 20561 (3+ muscles)- Dry Needling,  Patient/Family education, Cryotherapy, and Moist heat  PLAN FOR NEXT SESSION: assess HEP response, LE strengthening with emphasis on L hip flexion  Harlene Persons, PTA 01/22/24 11:30 AM Phone: [phone] Fax: [phone]

## 2024-01-23 ENCOUNTER — Other Ambulatory Visit: Payer: Self-pay | Admitting: Radiology

## 2024-01-23 MED ORDER — PREGABALIN 75 MG PO CAPS: 75.0000 mg | ORAL_CAPSULE | Freq: Two times a day (BID) | ORAL | 2 refills | Status: AC

## 2024-01-23 MED ORDER — METHOCARBAMOL 500 MG PO TABS: 500.0000 mg | ORAL_TABLET | Freq: Four times a day (QID) | ORAL | 0 refills | Status: DC

## 2024-01-23 MED ORDER — DICLOFENAC SODIUM 50 MG PO TBEC: 50.0000 mg | DELAYED_RELEASE_TABLET | Freq: Three times a day (TID) | ORAL | 2 refills | Status: AC

## 2024-01-24 ENCOUNTER — Ambulatory Visit

## 2024-01-24 ENCOUNTER — Ambulatory Visit: Admitting: Occupational Therapy

## 2024-01-24 DIAGNOSIS — R262 Difficulty in walking, not elsewhere classified: Secondary | ICD-10-CM

## 2024-01-24 DIAGNOSIS — M25641 Stiffness of right hand, not elsewhere classified: Secondary | ICD-10-CM

## 2024-01-24 DIAGNOSIS — M79641 Pain in right hand: Secondary | ICD-10-CM

## 2024-01-24 DIAGNOSIS — R600 Localized edema: Secondary | ICD-10-CM

## 2024-01-24 DIAGNOSIS — M25552 Pain in left hip: Secondary | ICD-10-CM | POA: Diagnosis not present

## 2024-01-24 DIAGNOSIS — M6281 Muscle weakness (generalized): Secondary | ICD-10-CM

## 2024-01-24 DIAGNOSIS — R208 Other disturbances of skin sensation: Secondary | ICD-10-CM

## 2024-01-24 DIAGNOSIS — R278 Other lack of coordination: Secondary | ICD-10-CM

## 2024-01-24 NOTE — Therapy (Signed)
 OUTPATIENT PHYSICAL THERAPY TREATMENT   Patient Name: Darlene Crosby MRN: [id number] DOB:[birth year], 58 y.o., female Today's Date: 01/24/2024  END OF SESSION:  PT End of Session - 01/24/24 1142     Visit Number 16    Number of Visits 17    Date for Recertification  02/05/24    Authorization Type [employer] Dual Complete    PT Start Time 1142    PT Stop Time 1220    PT Time Calculation (min) 38 min                 Past Medical History:  Diagnosis Date   Asthma    followed by pcp   Bronchitis    chronic   Hypertension    OA (osteoarthritis)    left knee, right shoulder, left hip   Pneumonia    a couple years ago per pt on 09/15/21   Seasonal allergies    Wears glasses    Past Surgical History:  Procedure Laterality Date   ANTERIOR HIP REVISION Left 06/06/2018   Procedure: LEFT HIP WOUND DEHISCENCE POSSIBLE HEAD AND LINER EXCHANGE;  Surgeon: Fidel Rogue, MD;  Location: [facility];  Service: Orthopedics;  Laterality: Left;   COLONOSCOPY  2015   DECOMPRESSIVE LUMBAR LAMINECTOMY LEVEL 2 N/A 09/22/2023   Procedure: DECOMPRESSIVE LUMBAR LAMINECTOMY LEVEL 2;  Surgeon: Georgina Ozell LABOR, MD;  Location: [facility];  Service: Orthopedics;  Laterality: N/A;  Lumbar 2 and Lumbar 3 segmental laminectomies with partial medial facetectomies   DIAGNOSTIC LAPAROSCOPY     tubal preg-took ovary and tube   LAPAROSCOPY FOR ECTOPIC PREGNANCY  1990s   LEFT SALPINGECTOMY AND RIGHT OOPHORECTOMY FOR ABNORMALITY   LUMBAR LAMINECTOMY N/A 09/20/2021   Procedure: L3-4 AND L4-5 CENTRAL LUMBAR LAMINECTOMIES;  Surgeon: Lucilla Lynwood BRAVO, MD;  Location: [facility];  Service: Orthopedics;  Laterality: N/A;   MASS EXCISION  02/15/2012   Procedure: EXCISION MASS;  Surgeon: Donnice LABOR Robinsons, MD;  Location: [facility];  Service: Orthopedics;  Laterality: Right;  Excision of Right Small Volar Mass   MYELOGRAM     TONSILLECTOMY  age 72   TOTAL HIP ARTHROPLASTY Left 04/25/2018   Procedure: TOTAL HIP  ARTHROPLASTY ANTERIOR APPROACH;  Surgeon: Fidel Rogue, MD;  Location: [facility];  Service: Orthopedics;  Laterality: Left;   TRIGGER FINGER RELEASE Right 2018   thumb   WISDOM TOOTH EXTRACTION  age 89   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 04/12/2022   Status post lumbar laminectomy 09/20/2021   Body mass index (BMI) 37.0-37.9, adult 03/14/2019   Lumbar stenosis without neurogenic claudication 03/14/2019   Hyperlipidemia 02/08/2019   Lumbar spondylosis 10/25/2018   Degeneration of lumbar intervertebral disc 10/01/2018   Surgical wound dehiscence 06/06/2018   Osteoarthritis of left hip 04/25/2018   Spinal stenosis of lumbar region 02/23/2018   Pain in left knee 01/10/2018   Trigger thumb of right hand 03/30/2017   Symptomatic mammary hypertrophy 11/28/2014   Dyspnea 03/26/2013   Asthma vs VCD  02/28/2013   Hypertensive disorder 06/07/2012   Pyogenic granuloma 02/15/2012    PCP: Lorren Greig PARAS, NP  REFERRING PROVIDER: Georgina Ozell LABOR, MD  REFERRING DIAG: R29.898 (ICD-10-CM) - Weakness of left hip   THERAPY DIAG:  Difficulty in walking, not elsewhere classified  Pain in left hip  Stiffness of right hand, not elsewhere classified  Muscle weakness (generalized)  Rationale for Evaluation and Treatment: Rehabilitation  ONSET DATE: 09/22/2023  SUBJECTIVE:   SUBJECTIVE STATEMENT: Pt presents to PT with no current pain. Has been compliant with HEP.   EVAL: Pt presents to PT s/p decompressive lumbar laminectomy performed by Dr. Georgina on 09/22/23. Pt notes that pain is occasionally in L anterior thigh and is especially present when fatigued. Denies sensation change in L LE. Symptoms of pain have resolved really well post surgery. She is frustrated by her continued inability to flex L hip in seated position.    PERTINENT HISTORY: L THA, Lumbar  decompression, HTN  PAIN:  Are you having pain?  Yes: NPRS scale: 0/10 Worst: 0/10 Pain location: L anterior thigh Pain description: sore Aggravating factors: squats, stairs Relieving factors: heat  PRECAUTIONS: None  RED FLAGS: None   WEIGHT BEARING RESTRICTIONS: No  FALLS:  Has patient fallen in last 6 months? No  LIVING ENVIRONMENT: Lives with: lives with their family Lives in: House/apartment Stairs: 1 STE Has following equipment at home: Single point cane  PLOF: Independent  PATIENT GOALS: improve L LE strength, get back to being able to do line dancing without limitation   NEXT MD VISIT: 12/18/2023  OBJECTIVE:  Note: Objective measures were completed at Evaluation unless otherwise noted.  DIAGNOSTIC FINDINGS: N/A  PATIENT SURVEYS:  LEFS  Extreme difficulty/unable (0), Quite a bit of difficulty (1), Moderate difficulty (2), Little difficulty (3), No difficulty (4) Survey date:  11/20/2023 01/03/2024  Any of your usual work, housework or school activities 2   2. Usual hobbies, recreational or sporting activities 3   3. Getting into/out of the bath 3   4. Walking between rooms 4   5. Putting on socks/shoes 3   6. Squatting  1   7. Lifting an object, like a bag of groceries from the floor 1   8. Performing light activities around your home 2   9. Performing heavy activities around your home 3   10. Getting into/out of a car 1   11. Walking 2 blocks 4   12. Walking 1 mile 4   13. Going up/down 10 stairs (1 flight) 3   14. Standing for 1 hour 3   15.  sitting for 1 hour 4   16. Running on even ground 0   17. Running on uneven ground 0   18. Making sharp turns while running fast 0   19. Hopping  1   20. Rolling over in bed 1   Score total:  42/80 45/80     COGNITION: Overall cognitive status: Within functional limits for tasks assessed     SENSATION: Light touch: Impaired   POSTURE: increased lumbar lordosis and flexed trunk   PALPATION: Slight  TTP to proximal L thigh  LOWER EXTREMITY ROM:  Active ROM Right eval Left eval  Hip flexion 110 45  Hip extension    Hip abduction    Hip adduction    Hip internal rotation    Hip external rotation    Knee flexion    Knee extension    Ankle dorsiflexion    Ankle plantarflexion    Ankle inversion    Ankle eversion     (Blank rows = not tested)  LOWER EXTREMITY MMT:  MMT Right eval Left eval Left 01/03/2024  Hip flexion 4 2+ 3  Hip extension     Hip abduction 4 4   Hip adduction     Hip internal rotation     Hip external  rotation     Knee flexion 5 5   Knee extension 5 5   Ankle dorsiflexion     Ankle plantarflexion     Ankle inversion     Ankle eversion      (Blank rows = not tested)  LOWER EXTREMITY SPECIAL TESTS:  DNT  FUNCTIONAL TESTS:  30 Second Sit to Stand: 11 reps 12/25/23: sit to stand: 13 reps   GAIT: Distance walked: 52ft Assistive device utilized: None Level of assistance: Complete Independence Comments: antalgic gait L LE   TREATMENT: [facility] Adult PT Treatment:                                                DATE: 01/24/24 Nustep L6 LE only x 4 minutes Standing L hip flexion 2x10 YTB Staggered STS L back 3x10 Supine L heel slide 2x10  Supine L heel slide into SLR x 5 - difficult Supine SLR 3x10 L Standing hip abd 2x10 25# Single leg press 2x10 60# L only  [facility] Adult PT Treatment:                                                DATE: 01/22/24 Therapeutic Exercise: Nustep L6 LE only x 8 minutes Left heel slide x 15 Left SLR 1 x 15 Bridge 15 x 2  SLS on foam with 2 way hip 15 each  6 inch step taps x 15 each  6 inch step up x 15 Staggered STS x 15  Side hip abduction  x 15  Side clam x 15     [facility] Adult PT Treatment:                                                DATE: 01/17/24 Therapeutic Exercise: Nustep L6 LE only x 8 minutes Left heel slide x 10 Left SLR 2 x 10  Bridge 15, x 10 SLS on foam with 2 way hip 10 x 2 6 inch step  taps x 10 each  6 inch step up x 12 Staggered STS 10 x 2 Side hip abduction 10 x 2     [facility] Adult PT Treatment:                                                DATE: 01/15/24 Therapeutic Exercise: Nustep L6 LE only x 8 minutes Left heel slide x 10 Left SLR 2 x 10  Bridge 15 SLS on foam with 2 way hip 10 x 2 6 inch step taps x 10 each  6 inch step up x 12 Side hip abduction 10 x 2  SL Leg press 60# x 20     [facility] Adult PT Treatment:  DATE: 01/10/24 Therapeutic Exercise: Nustep 8 min L6 LE only  Stair negotiation 6 inch steps  Step taps 4 inch x 10, 6 inch x 10 SLS on foam with 3 way hip 10 x 2 SL leg press 20 x 40#, 20 x 60#  STS with LLE back 10 x 3    [facility] Adult PT Treatment:                                                DATE: 01/08/24 Therapeutic Exercise: Nustep 8 min L6 LE only  Cybex hip 25 # hip abdct x 10 each  Cybe x hip ext 12.5 # x 10 each Cybex hip flexion 12.5 # x 10 each  SL leg press 4 x 10 40#  STS with LLE back 10 x 3  Supine SLR 3x10 L  Bridge 10 x 2 SL hip clam x 10    [facility] Adult PT Treatment:                                                DATE: 01/03/24 NuStep lvl 6 LE only x 5 minutes Assessment of tests/measures, goals, and outcomes for progress note Supine SLR 3x10 L Bridge with ball 2x15 Hooklying clamshell 3x15 blue band STS 3x10 L back low table no UE Cybex single leg press 3x10 40# L Standing hip abd 2x10 25# Standing hip flex 2x10 12.5# Cybex single leg press 3x8 40# L Supine SLR 3x10 L Supine heel slide 2x15 L  [facility] Adult PT Treatment:                                                DATE: 12/27/23 NuStep lvl 6 LE only x 5 minutes Standing hip abd 2x10 25# Standing hip flex 2x10 12.5# Cybex single leg press 3x8 40# L Supine SLR 3x10 L Supine heel slide 2x15 L        PATIENT EDUCATION:  Education details: eval findings, LEFS, HEP, POC Person educated: Patient Education  method: Explanation, Demonstration, and Handouts Education comprehension: verbalized understanding and returned demonstration  HOME EXERCISE PROGRAM: Access Code: [email or web address] URL: [email or web address] Date: 11/20/2023 Prepared by: Alm Kingdom  Exercises - Supine Quad Set  - 1 x daily - 7 x weekly - 2 sets - 10 reps - 5 sec hold - Active Straight Leg Raise with Quad Set (Mirrored)  - 1 x daily - 7 x weekly - 3 sets - 10 reps - Supine Heel Slide (Mirrored)  - 1 x daily - 7 x weekly - 3 sets - 10 reps - 3 sec hold - Sidelying Hip Abduction  - 1 x daily - 7 x weekly - 2-3 sets - 10 reps - Sit to Stand Without Arm Support  - 1 x daily - 7 x weekly - 2-3 sets - 10 reps  ASSESSMENT:  CLINICAL IMPRESSION: Pt was able to complete all prescribed exercises with no adverse effect. PT today focused on improving L hip flexion strength and overall function. Continues to benefit from skilled PT services, will continue per POC.   EVAL: Patient is  a 58 y.o. F who was seen today for physical therapy evaluation and treatment for continued L LE weakness s/p lumbar laminectomy performed on 09/22/23. Physical findings are consistent with surgery and recovery timeline as pt demonstrates decrease in L LE strength and general decrease in functional mobility. LEFS scores shows she is operating below PLOF for home ADLs and higher level mobility. Pt would benefit from skilled PT services working on improving LE strength and mobility post operatively in order to improve function and decrease L thigh pain.   OBJECTIVE IMPAIRMENTS: Abnormal gait, decreased activity tolerance, decreased balance, decreased endurance, decreased mobility, difficulty walking, decreased ROM, decreased strength, and pain   ACTIVITY LIMITATIONS: standing, squatting, stairs, transfers, and locomotion level  PARTICIPATION LIMITATIONS: meal prep, cleaning, driving, shopping, community activity, and yard work  PERSONAL FACTORS: Time  since onset of injury/illness/exacerbation and 3+ comorbidities: L THA, Lumbar decompression, HTN are also affecting patient's functional outcome.   REHAB POTENTIAL: Excellent  CLINICAL DECISION MAKING: Stable/uncomplicated  EVALUATION COMPLEXITY: Low   GOALS: Goals reviewed with patient? No  SHORT TERM GOALS: Target date: 12/11/2023   Pt will be compliant and knowledgeable with initial HEP for improved comfort and carryover Baseline: initial HEP given  Goal status: MET  2.  Pt will self report L LE pain no greater than 5/10 for improved comfort and functional ability Baseline: 8/10 at worst 12/11/23: 7/10 now that I have started coughing  12/25/23: 5/10 at worst  Goal status: MET  LONG TERM GOALS: Target date: 01/15/2024   Pt will improve LEFS to no less than 52/80 as proxy for functional improvement with home ADLs and higher level community activity Baseline: 42/80 01/03/2024: 45/80 Goal status: IN PROGRESS   2.  Pt will self report L LE pain no greater than 1-2/10 for improved comfort and functional ability Baseline: 8/10 at worst 01/03/2024: 7/10 at worst 01/15/24: 5/10 Goal status: IN PROGRESS   3.  Pt will increase 30 Second Sit to Stand rep count to no less than 15 reps for improved balance, strength, and functional mobility Baseline: 11 reps  12/25/23: 13 reps  01/03/2024: 13 reps Goal status: ONGOING   4.  Pt will improve L hip flexion MMT to no less than 4/5 for improved functional mobility and decreased pain Baseline: 2+/5 01/03/2024: 3/5 Goal status: PROGRESSING  5.  Pt will improve L active hip flexion in supine to no less than 90 degrees for improved functional mobility Baseline: 45 degrees Goal status: IN PROGRESS   PLAN:  PT FREQUENCY: 2x/week  PT DURATION: 8 weeks  PLANNED INTERVENTIONS: [id number]- PT Re-evaluation, 97110-Therapeutic exercises, 97530- Therapeutic activity, [id number]- Neuromuscular re-education, 97535- Self Care, [id number]- Manual therapy,  [id number]- Gait training, [phone]- Electrical stimulation (unattended), [id number]- Electrical stimulation (manual), 20560 (1-2 muscles), [id number] (3+ muscles)- Dry Needling, Patient/Family education, Cryotherapy, and Moist heat  PLAN FOR NEXT SESSION: assess HEP response, LE strengthening with emphasis on L hip flexion  Alm JAYSON Kingdom PT  01/24/24 12:23 PM

## 2024-01-24 NOTE — Therapy (Signed)
 OUTPATIENT OCCUPATIONAL THERAPY ORTHO TREATMENT  Patient Name: Darlene Crosby MRN: [id number] DOB:[birth year], 58 y.o., female Today's Date: 01/24/2024  PCP: Darlene Greig PARAS, NP  REFERRING PROVIDER: Arlinda Buster, MD   END OF SESSION:  OT End of Session - 01/24/24 1403     Visit Number 4    Number of Visits 16    Date for Recertification  03/04/24    Authorization Type [employer] Dual complete - covered 100%    Progress Note Due on Visit 10    OT Start Time 1403    OT Stop Time 1445    OT Time Calculation (min) 42 min    Equipment Utilized During Treatment Fluido, FM objects    Activity Tolerance Patient tolerated treatment well    Behavior During Therapy WFL for tasks assessed/performed          Past Medical History:  Diagnosis Date   Asthma    followed by pcp   Bronchitis    chronic   Hypertension    OA (osteoarthritis)    left knee, right shoulder, left hip   Pneumonia    a couple years ago per pt on 09/15/21   Seasonal allergies    Wears glasses    Past Surgical History:  Procedure Laterality Date   ANTERIOR HIP REVISION Left 06/06/2018   Procedure: LEFT HIP WOUND DEHISCENCE POSSIBLE HEAD AND LINER EXCHANGE;  Surgeon: Darlene Rogue, MD;  Location: [facility];  Service: Orthopedics;  Laterality: Left;   COLONOSCOPY  2015   DECOMPRESSIVE LUMBAR LAMINECTOMY LEVEL 2 N/A 09/22/2023   Procedure: DECOMPRESSIVE LUMBAR LAMINECTOMY LEVEL 2;  Surgeon: Darlene Ozell LABOR, MD;  Location: [facility];  Service: Orthopedics;  Laterality: N/A;  Lumbar 2 and Lumbar 3 segmental laminectomies with partial medial facetectomies   DIAGNOSTIC LAPAROSCOPY     tubal preg-took ovary and tube   LAPAROSCOPY FOR ECTOPIC PREGNANCY  1990s   LEFT SALPINGECTOMY AND RIGHT OOPHORECTOMY FOR ABNORMALITY   LUMBAR LAMINECTOMY N/A 09/20/2021   Procedure: L3-4 AND L4-5 CENTRAL LUMBAR LAMINECTOMIES;  Surgeon: Darlene Lynwood BRAVO, MD;  Location: [facility];  Service: Orthopedics;  Laterality: N/A;   MASS EXCISION   02/15/2012   Procedure: EXCISION MASS;  Surgeon: Darlene Crosby Robinsons, MD;  Location: [facility];  Service: Orthopedics;  Laterality: Right;  Excision of Right Small Volar Mass   MYELOGRAM     TONSILLECTOMY  age 66   TOTAL HIP ARTHROPLASTY Left 04/25/2018   Procedure: TOTAL HIP ARTHROPLASTY ANTERIOR APPROACH;  Surgeon: Darlene Rogue, MD;  Location: [facility];  Service: Orthopedics;  Laterality: Left;   TRIGGER FINGER RELEASE Right 2018   thumb   WISDOM TOOTH EXTRACTION  age 38   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 04/12/2022   Status post lumbar laminectomy 09/20/2021   Body mass index (BMI) 37.0-37.9, adult 03/14/2019   Lumbar stenosis without neurogenic claudication 03/14/2019   Hyperlipidemia 02/08/2019   Lumbar spondylosis 10/25/2018   Degeneration of lumbar intervertebral disc 10/01/2018   Surgical wound dehiscence 06/06/2018   Osteoarthritis of left hip 04/25/2018   Spinal stenosis of lumbar region 02/23/2018   Pain in left knee 01/10/2018   Trigger thumb of right hand 03/30/2017   Symptomatic mammary hypertrophy 11/28/2014   Dyspnea 03/26/2013   Asthma vs VCD  02/28/2013   Hypertensive disorder 06/07/2012   Pyogenic granuloma 02/15/2012    ONSET DATE: 11/20/23  referral date (surgery 12/07/23)   REFERRING DIAG: M25.541 (ICD-10-CM) - Arthralgia of metacarpophalangeal joint, right   Note: Needs OT 2 weeks s/p right long finger MCP arthroplasty  SPLINT  Imaging: XR Hand Complete Right Result Date: 12/21/2023 Stable appearance of the long finger MCP region status post prior resection arthroplasty, appropriate spacing is notable between the metacarpal head and proximal phalanx base  THERAPY DIAG:  Stiffness of right hand, not elsewhere classified  Muscle weakness (generalized)  Other lack of coordination  Other disturbances of skin  sensation  Pain in right hand  Localized edema  Rationale for Evaluation and Treatment: Rehabilitation  SUBJECTIVE:   SUBJECTIVE STATEMENT: Pt reports she has no pain today - just stiffness.   Pt reported that she has been wearing her splint when driving but has been taking it off at home for her exercises and light ADLs.   Pt accompanied by: self  PERTINENT HISTORY: Lt THA 2020, decompression of lumbar spline 08/2023 (L2-3)   PRECAUTIONS: Other: per protocol Rt hand, no lifting over 20 lbs d/t back    WEIGHT BEARING RESTRICTIONS: NWB through Rt hand  PAIN:  Are you having pain? No, just stiffness and achy and tender Rt middle finger around PIP joint  FALLS: Has patient fallen in last 6 months? No  LIVING ENVIRONMENT: Lives with: lives with 86 y.o. grandson Lives in: 1 story home, 1 step to enter Has following equipment at home: Single point cane, Environmental Consultant - 2 wheeled, and bed side commode  PLOF: Independent and on disability d/t hip   PATIENT GOALS: Get use of my Rt hand back  NEXT MD VISIT: 02/01/24  OBJECTIVE:  Note: Objective measures were completed at Evaluation unless otherwise noted.  HAND DOMINANCE: Right  ADLs: Overall ADLs: Mod I for BADLS and IADLS w/ Lt hand and difficulty  FUNCTIONAL OUTCOME MEASURES: 01/09/24: Darlene Crosby: 52.27% Deficit RUE   UPPER EXTREMITY ROM:   BUE AROM WNLs at shoulders, elbows, and forearms  Active ROM Right eval Left eval  Shoulder flexion    Shoulder abduction    Shoulder adduction    Shoulder extension    Shoulder internal rotation    Shoulder external rotation    Elbow flexion    Elbow extension    Wrist flexion 45   Wrist extension 35   Wrist ulnar deviation    Wrist radial deviation    Wrist pronation    Wrist supination    (Blank rows = not tested)  Active ROM Right eval Left eval  Thumb MCP (0-60)    Thumb IP (0-80)    Thumb Radial abd/add (0-55)     Thumb Palmar abd/add (0-45)     Thumb  Opposition to Small Finger     Index MCP (0-90)     Index PIP (0-100)     Index DIP (0-70)      Long MCP (0-90) 54*, -15*     Long PIP (0-100) 75*, 0*     Long DIP (0-70)      Ring MCP (0-90)      Ring PIP (0-100)      Ring DIP (0-70)      Little MCP (0-90)      Little PIP (0-100)      Little DIP (0-70)      (Blank rows = not tested)  01/17/24: Rt long finger MP joint  = 65*, - 20*  Rt long finger PIP joint = 90*, -10*   UPPER EXTREMITY MMT:  NT   HAND FUNCTION: Grip strength TBD once precautions lifted  COORDINATION: Impaired d/t edema and decreased ROM   SENSATION: Not formally assessed  EDEMA: moderate dorsal hand and in fingers (long finger worse)  COGNITION: Overall cognitive status: Within functional limits for tasks assessed  OBSERVATIONS: Pt came directly from MD office w/ gauze dressing wrapped around hand.    TREATMENT DATE: 01/24/24                                                                                                                            - Therapeutic exercises completed for duration as noted below including:  Pt placed RUE in Fluidotherapy machine with supervised AROM x 10 min. Pt was educated to complete tendon glides and other HEP activities during modality time to improve ROM and decrease pain/stiffness of affected extremity by use of the machine's massaging action and thermal properties.   - Manual therapy completed for duration as noted below including:  Therapist provided manual techniques and educated pt in scar massage at healed site of incision for reduction of scar tissue, to promote improved AROM and pain reduction of affected surgical site. IASTM provided via tissue movers with education provided on how to perfrom at home with edge of smooth utensil.  Improved texture and appearance to incision noted upon completion with less palpable adhesions and therefore improved ROM.  Pt provided instruction re: massaging scar in three  directions: circles, side to side, and up and down with return demonstration sought.  - Therapeutic activities completed for duration as noted below including:  Coordination Exercise/Activity handout with images provided for various activities to work on B UE finger ROM, dexterity and isolated movements with demonstration and practice, as well as modification, hand over hand guidance and cues throughout to improve technique, digital isolation and ease of performing task.  Tasks included:  Pick up coins, dominoes, buttons, marbles, dried beans/pasta of different sizes ... To place in containers To stack - with guidance to work on include/isolate specific fingers. To pick up items one at a time until patient got 5+ in her hand and then move item from palm to fingertips to release ie) Finger-to-palm then palm-to-finger translation of small items - Options to vary difficulty include using a washcloth under items like coins or using larger items (checkers vs coins or blocks/dominos vs dice) for increased ease of picking up items.  Shuffling, Flipping and dealing cards 1 at a time. -- Setup patient to work on sorting cards, focusing on using index finger with thumb to flip cards or holding deck of cards in palm of hand and push off 1 card at a time from the top of the deck using only thumb    Rotate golf balls (clockwise and counter-clockwise) with forearm pronated and balls on table or supinated and balls in hand.   Twirl pen/cil between fingers. - Encouragement to isolate fingers individually and twirl (rotation) or flipping and shift up and down  the pen (translation) to get it in position for writing or erasing.    Tear a piece of paper towel and roll it into small balls with fingertips ie) straw wrapping when eating out.    Patient is encouraged to take breaks, relax arm/shoulder by supporting forearm, minimize compensatory motions and a try different activities throughout the day/week including  games like Londa (for the dice), card games, Connect 4 etc.   Patient benefited from extra time, verbal/tactile cues, and modeling of task to allow time for processing of verbal instructions and improve motor planning of unfamiliar movements.   PATIENT EDUCATION: Education details: Coordination activities Person educated: Patient Education method: Programmer, Multimedia, Facilities Manager, Verbal cues, and Handouts Education comprehension: verbalized understanding, returned demonstration, and verbal cues required  HOME EXERCISE PROGRAM: 01/04/24: A/ROM HEP for Rt hand/wrist 01/09/24: P/ROM HEP, scar massage 01/24/24: Coordination Activities  GOALS: Goals reviewed with patient? Yes  SHORT TERM GOALS: Target date: 02/03/24  Independent with splint wear and care Baseline: Goal status: MET  2.  Independent with ROM HEP  Baseline:  Goal status: MET  3.  Pt to verbalize understanding of edema management strategies  Baseline:  Goal status: IN PROGRESS - pt now tolerating tensogrip stockinette  4.  Pt to improve in Rt long finger MP flexion and PIP flexion by 10* or more Baseline: 54*, 75*  Goal status: MET (01/17/24 = 65*, 90*)   5.  Pt to improve Rt long finger MP extension to -5*  Baseline: -15* Goal status: IN PROGRESS   LONG TERM GOALS: Target date: 03/04/24  Independent with updated strengthening HEP  Baseline:  Goal status: INITIAL  2.  Pt to demo ROM Rt hand WFL's to make fist for full grasp/release Baseline:  Goal status: IN Progress  3.  Pt to return to using Rt hand as dominant hand for ADLS and bilateral tasks Baseline:  Goal status: IN Progress  4.  Pt to demo 50% or greater Rt grip strength compared to Lt hand Baseline:  Goal status: IN Progress  5.  Quick Dash to be 20% or less deficit RUE Baseline: 52.27%  Goal status: REVISED   ASSESSMENT:  CLINICAL IMPRESSION: Patient seen today for occupational therapy treatment for s/p Rt long finger MCP arthroplasty 7  weeks ago due to OA. Patient responded well to coordination activities to gradually improve RUE digital ROM and functional grasp. Pt will benefit from skilled OT services in the outpatient setting to decreased splint wear appropriately and help pt return to PLOF as able.    PERFORMANCE DEFICITS: in functional skills including ADLs, IADLs, coordination, dexterity, sensation, edema, ROM, strength, pain, fascial restrictions, Fine motor control, Gross motor control, decreased knowledge of precautions, and UE functional use.   IMPAIRMENTS: are limiting patient from ADLs, IADLs, leisure, and social participation.   COMORBIDITIES: may have co-morbidities  that affects occupational performance. Patient will benefit from skilled OT to address above impairments and improve overall function.  REHAB POTENTIAL: Good  PLAN:  OT FREQUENCY: 1-2x/week (may begin at 1x/wk then increase to 2x/wk)  OT DURATION: 8 weeks  PLANNED INTERVENTIONS: 97535 self care/ADL training, 02889 therapeutic exercise, 97530 therapeutic activity, 97140 manual therapy, 97035 ultrasound, 97018 paraffin, 02960 fluidotherapy, 97010 moist heat, 97010 cryotherapy, 97034 contrast bath, 97760 Orthotic Initial, 97763 Orthotic/Prosthetic subsequent, scar mobilization, passive range of motion, compression bandaging, patient/family education, and DME and/or AE instructions  RECOMMENDED OTHER SERVICES: none at this time  CONSULTED AND AGREED WITH PLAN OF CARE: Patient  PLAN FOR NEXT  SESSION:  Fluidotherapy,  Night orthosis versus clam shell; Make pm volar hand based extensor splint for Rt long finger (Teams message sent to Dr. Erwin to see if ok)  Progress HEPs per [place]  protocol pp 27-28 - putty s/p ~02/01/24   Clarita LITTIE Pride, OT 01/24/2024, 5:34 PM

## 2024-01-29 ENCOUNTER — Ambulatory Visit: Payer: Self-pay

## 2024-01-29 ENCOUNTER — Ambulatory Visit: Admitting: Occupational Therapy

## 2024-01-29 ENCOUNTER — Encounter: Payer: Self-pay | Admitting: Occupational Therapy

## 2024-01-29 DIAGNOSIS — M25552 Pain in left hip: Secondary | ICD-10-CM

## 2024-01-29 DIAGNOSIS — M79641 Pain in right hand: Secondary | ICD-10-CM | POA: Diagnosis present

## 2024-01-29 DIAGNOSIS — R278 Other lack of coordination: Secondary | ICD-10-CM | POA: Diagnosis present

## 2024-01-29 DIAGNOSIS — M6281 Muscle weakness (generalized): Secondary | ICD-10-CM

## 2024-01-29 DIAGNOSIS — R208 Other disturbances of skin sensation: Secondary | ICD-10-CM | POA: Insufficient documentation

## 2024-01-29 DIAGNOSIS — R600 Localized edema: Secondary | ICD-10-CM | POA: Diagnosis present

## 2024-01-29 DIAGNOSIS — R262 Difficulty in walking, not elsewhere classified: Secondary | ICD-10-CM | POA: Insufficient documentation

## 2024-01-29 DIAGNOSIS — M25641 Stiffness of right hand, not elsewhere classified: Secondary | ICD-10-CM | POA: Insufficient documentation

## 2024-01-29 NOTE — Therapy (Signed)
 OUTPATIENT OCCUPATIONAL THERAPY ORTHO TREATMENT  Patient Name: Darlene Crosby MRN: [id number] DOB:July 31, 1965, 58 y.o., female Today's Date: 01/29/2024  PCP: Jaycee Greig PARAS, NP  REFERRING PROVIDER: Arlinda Buster, MD   END OF SESSION:  OT End of Session - 01/29/24 1107     Visit Number 5    Number of Visits 16    Date for Recertification  03/04/24    Authorization Type [employer] Dual complete - covered 100%    Progress Note Due on Visit 10    OT Start Time 1104    OT Stop Time 1145    OT Time Calculation (min) 41 min    Equipment Utilized During Treatment Fluido, FM objects    Activity Tolerance Patient tolerated treatment well    Behavior During Therapy WFL for tasks assessed/performed          Past Medical History:  Diagnosis Date   Asthma    followed by pcp   Bronchitis    chronic   Hypertension    OA (osteoarthritis)    left knee, right shoulder, left hip   Pneumonia    a couple years ago per pt on 09/15/21   Seasonal allergies    Wears glasses    Past Surgical History:  Procedure Laterality Date   ANTERIOR HIP REVISION Left 06/06/2018   Procedure: LEFT HIP WOUND DEHISCENCE POSSIBLE HEAD AND LINER EXCHANGE;  Surgeon: Fidel Rogue, MD;  Location: [facility];  Service: Orthopedics;  Laterality: Left;   COLONOSCOPY  2015   DECOMPRESSIVE LUMBAR LAMINECTOMY LEVEL 2 N/A 09/22/2023   Procedure: DECOMPRESSIVE LUMBAR LAMINECTOMY LEVEL 2;  Surgeon: Georgina Ozell LABOR, MD;  Location: [facility];  Service: Orthopedics;  Laterality: N/A;  Lumbar 2 and Lumbar 3 segmental laminectomies with partial medial facetectomies   DIAGNOSTIC LAPAROSCOPY     tubal preg-took ovary and tube   LAPAROSCOPY FOR ECTOPIC PREGNANCY  1990s   LEFT SALPINGECTOMY AND RIGHT OOPHORECTOMY FOR ABNORMALITY   LUMBAR LAMINECTOMY N/A 09/20/2021   Procedure: L3-4 AND L4-5 CENTRAL LUMBAR LAMINECTOMIES;  Surgeon: Lucilla Lynwood BRAVO, MD;  Location: [facility];  Service: Orthopedics;  Laterality: N/A;   MASS EXCISION   02/15/2012   Procedure: EXCISION MASS;  Surgeon: Donnice LABOR Robinsons, MD;  Location: [facility];  Service: Orthopedics;  Laterality: Right;  Excision of Right Small Volar Mass   MYELOGRAM     TONSILLECTOMY  age 38   TOTAL HIP ARTHROPLASTY Left 04/25/2018   Procedure: TOTAL HIP ARTHROPLASTY ANTERIOR APPROACH;  Surgeon: Fidel Rogue, MD;  Location: [facility];  Service: Orthopedics;  Laterality: Left;   TRIGGER FINGER RELEASE Right 2018   thumb   WISDOM TOOTH EXTRACTION  age 51   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 04/12/2022   Status post lumbar laminectomy 09/20/2021   Body mass index (BMI) 37.0-37.9, adult 03/14/2019   Lumbar stenosis without neurogenic claudication 03/14/2019   Hyperlipidemia 02/08/2019   Lumbar spondylosis 10/25/2018   Degeneration of lumbar intervertebral disc 10/01/2018   Surgical wound dehiscence 06/06/2018   Osteoarthritis of left hip 04/25/2018   Spinal stenosis of lumbar region 02/23/2018   Pain in left knee 01/10/2018   Trigger thumb of right hand 03/30/2017   Symptomatic mammary hypertrophy 11/28/2014   Dyspnea 03/26/2013   Asthma vs VCD  02/28/2013   Hypertensive disorder 06/07/2012   Pyogenic granuloma 02/15/2012    ONSET DATE: 11/20/23  referral date (surgery 12/07/23)   REFERRING DIAG: M25.541 (ICD-10-CM) - Arthralgia of metacarpophalangeal joint, right   Note: Needs OT 2 weeks s/p right long finger MCP arthroplasty  SPLINT  Imaging: XR Hand Complete Right Result Date: 12/21/2023 Stable appearance of the long finger MCP region status post prior resection arthroplasty, appropriate spacing is notable between the metacarpal head and proximal phalanx base  THERAPY DIAG:  Stiffness of right hand, not elsewhere classified  Muscle weakness (generalized)  Other lack of coordination  Other disturbances of skin  sensation  Pain in right hand  Localized edema  Rationale for Evaluation and Treatment: Rehabilitation  SUBJECTIVE:   SUBJECTIVE STATEMENT: Pt reports she has no pain today   Pt accompanied by: self  PERTINENT HISTORY: Lt THA 2020, decompression of lumbar spline 08/2023 (L2-3)   PRECAUTIONS: Other: per protocol Rt hand, no lifting over 20 lbs d/t back    WEIGHT BEARING RESTRICTIONS: NWB through Rt hand  PAIN:  Are you having pain? No, just stiffness and achy and tender Rt middle finger around PIP joint  FALLS: Has patient fallen in last 6 months? No  LIVING ENVIRONMENT: Lives with: lives with 73 y.o. grandson Lives in: 1 story home, 1 step to enter Has following equipment at home: Single point cane, Environmental Consultant - 2 wheeled, and bed side commode  PLOF: Independent and on disability d/t hip   PATIENT GOALS: Get use of my Rt hand back  NEXT MD VISIT: 02/01/24  OBJECTIVE:  Note: Objective measures were completed at Evaluation unless otherwise noted.  HAND DOMINANCE: Right  ADLs: Overall ADLs: Mod I for BADLS and IADLS w/ Lt hand and difficulty  FUNCTIONAL OUTCOME MEASURES: 01/09/24: [name]: 52.27% Deficit RUE   UPPER EXTREMITY ROM:   BUE AROM WNLs at shoulders, elbows, and forearms  Active ROM Right eval Left eval  Shoulder flexion    Shoulder abduction    Shoulder adduction    Shoulder extension    Shoulder internal rotation    Shoulder external rotation    Elbow flexion    Elbow extension    Wrist flexion 45   Wrist extension 35   Wrist ulnar deviation    Wrist radial deviation    Wrist pronation    Wrist supination    (Blank rows = not tested)  Active ROM Right eval Left eval  Thumb MCP (0-60)    Thumb IP (0-80)    Thumb Radial abd/add (0-55)     Thumb Palmar abd/add (0-45)     Thumb Opposition to Small Finger     Index MCP (0-90)     Index PIP (0-100)     Index DIP (0-70)      Long MCP (0-90) 54*, -15*     Long PIP (0-100) 75*, 0*      Long DIP (0-70)      Ring MCP (0-90)      Ring PIP (0-100)      Ring DIP (0-70)      Little MCP (0-90)      Little PIP (0-100)      Little DIP (0-70)      (Blank rows = not tested)  01/17/24: Rt long finger MP joint  = 65*, - 20*  Rt long finger PIP joint = 90*, -10*   UPPER EXTREMITY MMT:   NT   HAND FUNCTION: Grip strength TBD once precautions lifted  COORDINATION: Impaired d/t edema and decreased ROM   SENSATION: Not formally assessed  EDEMA: moderate dorsal  hand and in fingers (long finger worse)  COGNITION: Overall cognitive status: Within functional limits for tasks assessed  OBSERVATIONS: Pt came directly from MD office w/ gauze dressing wrapped around hand.    TREATMENT DATE: 01/29/24                                                                                                                            Fabricated and fitted volar hand based splint with long finger in full extension to wear at night. Pt instructed to wear at night for continual extension. Issued splint and reviewed wear and care. (MD gave the ok to make this splint for pm)  Fluidotherapy x 10 minutes for Rt hand to swelling and stiffness. No adverse reactions.   Pt has been doing scar massage and scar is moving nicely with no adhesions   PATIENT EDUCATION: Education details: see above Person educated: Patient Education method: Medical Illustrator Education comprehension: verbalized understanding, returned demonstration, and verbal cues required  HOME EXERCISE PROGRAM: 01/04/24: A/ROM HEP for Rt hand/wrist 01/09/24: P/ROM HEP, scar massage 01/24/24: Coordination Activities  GOALS: Goals reviewed with patient? Yes  SHORT TERM GOALS: Target date: 02/03/24  Independent with splint wear and care Baseline: Goal status: MET  2.  Independent with ROM HEP  Baseline:  Goal status: MET  3.  Pt to verbalize understanding of edema management strategies  Baseline:  Goal status:  IN PROGRESS - pt now tolerating tensogrip stockinette  4.  Pt to improve in Rt long finger MP flexion and PIP flexion by 10* or more Baseline: 54*, 75*  Goal status: MET (01/17/24 = 65*, 90*)   5.  Pt to improve Rt long finger MP extension to -5*  Baseline: -15* Goal status: IN PROGRESS   LONG TERM GOALS: Target date: 03/04/24  Independent with updated strengthening HEP  Baseline:  Goal status: INITIAL  2.  Pt to demo ROM Rt hand WFL's to make fist for full grasp/release Baseline:  Goal status: IN Progress  3.  Pt to return to using Rt hand as dominant hand for ADLS and bilateral tasks Baseline:  Goal status: IN Progress  4.  Pt to demo 50% or greater Rt grip strength compared to Lt hand Baseline:  Goal status: IN Progress  5.  Quick Dash to be 20% or less deficit RUE Baseline: 52.27%  Goal status: REVISED   ASSESSMENT:  CLINICAL IMPRESSION: Patient seen today for occupational therapy treatment for s/p Rt long finger MCP arthroplasty almost 8 weeks ago due to OA. Patient responded well to coordination activities to gradually improve RUE digital ROM and functional grasp. Pt will benefit from skilled OT services in the outpatient setting to decreased splint wear appropriately and help pt return to PLOF as able.    PERFORMANCE DEFICITS: in functional skills including ADLs, IADLs, coordination, dexterity, sensation, edema, ROM, strength, pain, fascial restrictions, Fine motor control, Gross motor control, decreased knowledge of precautions, and UE functional use.  IMPAIRMENTS: are limiting patient from ADLs, IADLs, leisure, and social participation.   COMORBIDITIES: may have co-morbidities  that affects occupational performance. Patient will benefit from skilled OT to address above impairments and improve overall function.  REHAB POTENTIAL: Good  PLAN:  OT FREQUENCY: 1-2x/week (may begin at 1x/wk then increase to 2x/wk)  OT DURATION: 8 weeks  PLANNED INTERVENTIONS:  97535 self care/ADL training, 02889 therapeutic exercise, 97530 therapeutic activity, 97140 manual therapy, 97035 ultrasound, 97018 paraffin, 02960 fluidotherapy, 97010 moist heat, 97010 cryotherapy, 97034 contrast bath, 97760 Orthotic Initial, 97763 Orthotic/Prosthetic subsequent, scar mobilization, passive range of motion, compression bandaging, patient/family education, and DME and/or AE instructions  RECOMMENDED OTHER SERVICES: none at this time  CONSULTED AND AGREED WITH PLAN OF CARE: Patient  PLAN FOR NEXT SESSION:  Progress HEPs per [place]  protocol pp 27-28 - putty s/p ~02/01/24 ? D/c clamshell splint - pt sees MD on 02/01/24   Burnard JINNY Roads, OT 01/29/2024, 11:08 AM

## 2024-01-29 NOTE — Therapy (Signed)
 OUTPATIENT PHYSICAL THERAPY TREATMENT/DISCHARGE  PHYSICAL THERAPY DISCHARGE SUMMARY  Visits from Start of Care: 17  Current functional level related to goals / functional outcomes: See goals and objective   Remaining deficits: See goals and objective   Education / Equipment: HEP   Patient agrees to discharge. Patient goals were mostly met. Patient is being discharged due to meeting the stated rehab goals.   Patient Name: Darlene Crosby MRN: [id number] DOB:January 05, 1966, 58 y.o., female Today's Date: 01/29/2024  END OF SESSION:  PT End of Session - 01/29/24 1014     Visit Number 17    Number of Visits 17    Date for Recertification  02/05/24    Authorization Type [employer] Dual Complete    PT Start Time 1015    PT Stop Time 1053    PT Time Calculation (min) 38 min                  Past Medical History:  Diagnosis Date   Asthma    followed by pcp   Bronchitis    chronic   Hypertension    OA (osteoarthritis)    left knee, right shoulder, left hip   Pneumonia    a couple years ago per pt on 09/15/21   Seasonal allergies    Wears glasses    Past Surgical History:  Procedure Laterality Date   ANTERIOR HIP REVISION Left 06/06/2018   Procedure: LEFT HIP WOUND DEHISCENCE POSSIBLE HEAD AND LINER EXCHANGE;  Surgeon: Fidel Rogue, MD;  Location: [facility];  Service: Orthopedics;  Laterality: Left;   COLONOSCOPY  2015   DECOMPRESSIVE LUMBAR LAMINECTOMY LEVEL 2 N/A 09/22/2023   Procedure: DECOMPRESSIVE LUMBAR LAMINECTOMY LEVEL 2;  Surgeon: Georgina Ozell LABOR, MD;  Location: [facility];  Service: Orthopedics;  Laterality: N/A;  Lumbar 2 and Lumbar 3 segmental laminectomies with partial medial facetectomies   DIAGNOSTIC LAPAROSCOPY     tubal preg-took ovary and tube   LAPAROSCOPY FOR ECTOPIC PREGNANCY  1990s   LEFT SALPINGECTOMY AND RIGHT OOPHORECTOMY FOR ABNORMALITY   LUMBAR LAMINECTOMY N/A 09/20/2021   Procedure: L3-4 AND L4-5 CENTRAL LUMBAR LAMINECTOMIES;  Surgeon: Lucilla Lynwood BRAVO, MD;  Location: [facility];  Service: Orthopedics;  Laterality: N/A;   MASS EXCISION  02/15/2012   Procedure: EXCISION MASS;  Surgeon: Donnice LABOR Robinsons, MD;  Location: [facility];  Service: Orthopedics;  Laterality: Right;  Excision of Right Small Volar Mass   MYELOGRAM     TONSILLECTOMY  age 81   TOTAL HIP ARTHROPLASTY Left 04/25/2018   Procedure: TOTAL HIP ARTHROPLASTY ANTERIOR APPROACH;  Surgeon: Fidel Rogue, MD;  Location: [facility];  Service: Orthopedics;  Laterality: Left;   TRIGGER FINGER RELEASE Right 2018   thumb   WISDOM TOOTH EXTRACTION  age 35   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 04/12/2022   Status post lumbar laminectomy 09/20/2021   Body mass index (BMI) 37.0-37.9, adult 03/14/2019   Lumbar stenosis without neurogenic claudication 03/14/2019   Hyperlipidemia 02/08/2019   Lumbar spondylosis 10/25/2018   Degeneration of lumbar intervertebral disc 10/01/2018   Surgical wound dehiscence 06/06/2018   Osteoarthritis of left hip 04/25/2018   Spinal stenosis of lumbar region 02/23/2018   Pain in left knee 01/10/2018   Trigger thumb of right hand 03/30/2017   Symptomatic mammary hypertrophy 11/28/2014   Dyspnea 03/26/2013   Asthma vs VCD  02/28/2013  Hypertensive disorder 06/07/2012   Pyogenic granuloma 02/15/2012    PCP: Lorren Greig PARAS, NP  REFERRING PROVIDER: Georgina Ozell LABOR, MD  REFERRING DIAG: [id number] (ICD-10-CM) - Weakness of left hip   THERAPY DIAG:  Muscle weakness (generalized)  Difficulty in walking, not elsewhere classified  Pain in left hip  Rationale for Evaluation and Treatment: Rehabilitation  ONSET DATE: 09/22/2023  SUBJECTIVE:   SUBJECTIVE STATEMENT: Pt presents to PT with no current pain, has been compliant with HEP. Still feels the L hip is weak but has gotten stronger with PT.   EVAL: Pt  presents to PT s/p decompressive lumbar laminectomy performed by Dr. Georgina on 09/22/23. Pt notes that pain is occasionally in L anterior thigh and is especially present when fatigued. Denies sensation change in L LE. Symptoms of pain have resolved really well post surgery. She is frustrated by her continued inability to flex L hip in seated position.    PERTINENT HISTORY: L THA, Lumbar decompression, HTN  PAIN:  Are you having pain?  Yes: NPRS scale: 0/10 Worst: 0/10 Pain location: L anterior thigh Pain description: sore Aggravating factors: squats, stairs Relieving factors: heat  PRECAUTIONS: None  RED FLAGS: None   WEIGHT BEARING RESTRICTIONS: No  FALLS:  Has patient fallen in last 6 months? No  LIVING ENVIRONMENT: Lives with: lives with their family Lives in: House/apartment Stairs: 1 STE Has following equipment at home: Single point cane  PLOF: Independent  PATIENT GOALS: improve L LE strength, get back to being able to do line dancing without limitation   NEXT MD VISIT: 12/18/2023  OBJECTIVE:  Note: Objective measures were completed at Evaluation unless otherwise noted.  DIAGNOSTIC FINDINGS: N/A  PATIENT SURVEYS:  LEFS  Extreme difficulty/unable (0), Quite a bit of difficulty (1), Moderate difficulty (2), Little difficulty (3), No difficulty (4) Survey date:  11/20/2023 01/03/2024  Any of your usual work, housework or school activities 2   2. Usual hobbies, recreational or sporting activities 3   3. Getting into/out of the bath 3   4. Walking between rooms 4   5. Putting on socks/shoes 3   6. Squatting  1   7. Lifting an object, like a bag of groceries from the floor 1   8. Performing light activities around your home 2   9. Performing heavy activities around your home 3   10. Getting into/out of a car 1   11. Walking 2 blocks 4   12. Walking 1 mile 4   13. Going up/down 10 stairs (1 flight) 3   14. Standing for 1 hour 3   15.  sitting for 1 hour 4   16.  Running on even ground 0   17. Running on uneven ground 0   18. Making sharp turns while running fast 0   19. Hopping  1   20. Rolling over in bed 1   Score total:  42/80 45/80     COGNITION: Overall cognitive status: Within functional limits for tasks assessed     SENSATION: Light touch: Impaired   POSTURE: increased lumbar lordosis and flexed trunk   PALPATION: Slight TTP to proximal L thigh  LOWER EXTREMITY ROM:  Active ROM Right eval Left eval Left 01/29/2024  Hip flexion 110 45 90  Hip extension     Hip abduction     Hip adduction     Hip internal rotation     Hip external rotation     Knee flexion     Knee  extension     Ankle dorsiflexion     Ankle plantarflexion     Ankle inversion     Ankle eversion      (Blank rows = not tested)  LOWER EXTREMITY MMT:  MMT Right eval Left eval Left 01/03/2024 Left 01/29/2024  Hip flexion 4 2+ 3 3  Hip extension      Hip abduction 4 4    Hip adduction      Hip internal rotation      Hip external rotation      Knee flexion 5 5    Knee extension 5 5    Ankle dorsiflexion      Ankle plantarflexion      Ankle inversion      Ankle eversion       (Blank rows = not tested)  LOWER EXTREMITY SPECIAL TESTS:  DNT  FUNCTIONAL TESTS:  30 Second Sit to Stand: 11 reps 12/25/23: sit to stand: 13 reps   GAIT: Distance walked: 74ft Assistive device utilized: None Level of assistance: Complete Independence Comments: antalgic gait L LE   TREATMENT: [facility] Adult PT Treatment:                                                DATE: 01/29/24 Assessment of tests/measures, goals, and outcomes Nustep L6 LE only x 5 minutes Supine SLR 3x10 L Supine L heel slide 3x10  Standing march L 2x10 Standing L hip flexion 2x10 YTB Staggered STS L back 2x10  [facility] Adult PT Treatment:                                                DATE: 01/24/24 Nustep L6 LE only x 4 minutes Standing L hip flexion 2x10 YTB Staggered STS L back 3x10 Supine  L heel slide 2x10  Supine L heel slide into SLR x 5 - difficult Supine SLR 3x10 L Standing hip abd 2x10 25# Single leg press 2x10 60# L only  [facility] Adult PT Treatment:                                                DATE: 01/22/24 Therapeutic Exercise: Nustep L6 LE only x 8 minutes Left heel slide x 15 Left SLR 1 x 15 Bridge 15 x 2  SLS on foam with 2 way hip 15 each  6 inch step taps x 15 each  6 inch step up x 15 Staggered STS x 15  Side hip abduction  x 15  Side clam x 15     [facility] Adult PT Treatment:                                                DATE: 01/17/24 Therapeutic Exercise: Nustep L6 LE only x 8 minutes Left heel slide x 10 Left SLR 2 x 10  Bridge 15, x 10 SLS on foam with 2 way hip 10 x 2 6 inch step taps x 10  each  6 inch step up x 12 Staggered STS 10 x 2 Side hip abduction 10 x 2     [facility] Adult PT Treatment:                                                DATE: 01/15/24 Therapeutic Exercise: Nustep L6 LE only x 8 minutes Left heel slide x 10 Left SLR 2 x 10  Bridge 15 SLS on foam with 2 way hip 10 x 2 6 inch step taps x 10 each  6 inch step up x 12 Side hip abduction 10 x 2  SL Leg press 60# x 20     [facility] Adult PT Treatment:                                                DATE: 01/10/24 Therapeutic Exercise: Nustep 8 min L6 LE only  Stair negotiation 6 inch steps  Step taps 4 inch x 10, 6 inch x 10 SLS on foam with 3 way hip 10 x 2 SL leg press 20 x 40#, 20 x 60#  STS with LLE back 10 x 3    [facility] Adult PT Treatment:                                                DATE: 01/08/24 Therapeutic Exercise: Nustep 8 min L6 LE only  Cybex hip 25 # hip abdct x 10 each  Cybe x hip ext 12.5 # x 10 each Cybex hip flexion 12.5 # x 10 each  SL leg press 4 x 10 40#  STS with LLE back 10 x 3  Supine SLR 3x10 L  Bridge 10 x 2 SL hip clam x 10    [facility] Adult PT Treatment:                                                DATE: 01/03/24 NuStep lvl 6 LE only x  5 minutes Assessment of tests/measures, goals, and outcomes for progress note Supine SLR 3x10 L Bridge with ball 2x15 Hooklying clamshell 3x15 blue band STS 3x10 L back low table no UE Cybex single leg press 3x10 40# L Standing hip abd 2x10 25# Standing hip flex 2x10 12.5# Cybex single leg press 3x8 40# L Supine SLR 3x10 L Supine heel slide 2x15 L  [facility] Adult PT Treatment:                                                DATE: 12/27/23 NuStep lvl 6 LE only x 5 minutes Standing hip abd 2x10 25# Standing hip flex 2x10 12.5# Cybex single leg press 3x8 40# L Supine SLR 3x10 L Supine heel slide 2x15 L   PATIENT EDUCATION:  Education details: eval findings, LEFS, HEP, POC Person educated: Patient Education method:  Explanation, Demonstration, and Handouts Education comprehension: verbalized understanding and returned demonstration  HOME EXERCISE PROGRAM: Access Code: [email or web address] URL: [email or web address] Date: 01/29/2024 Prepared by: Alm Kingdom  Exercises - Active Straight Leg Raise with Quad Set (Mirrored)  - 1 x daily - 7 x weekly - 3 sets - 10 reps - Supine Heel Slide (Mirrored)  - 1 x daily - 7 x weekly - 3 sets - 10 reps - 3 sec hold - Staggered Sit-to-Stand (Mirrored)  - 1 x daily - 7 x weekly - 2-3 sets - 10 reps - Standing March with Counter Support  - 1 x daily - 7 x weekly - 3 sets - 10 reps - Marching with Resistance  - 1 x daily - 7 x weekly - 3 sets - 10 reps - yellow hold  ASSESSMENT:  CLINICAL IMPRESSION: Pt was able to complete all prescribed exercises and demonstrated knowledge of HEP with no adverse effect. Over the course of PT treatment she has progressed well showing improved L hip flexion strength and subjective functional ability. She did not quite meet all mobility and strength goals but should continue to improve with HEP compliance and is ready to discharge at this time.   EVAL: Patient is a 58 y.o. F who was seen today for physical therapy  evaluation and treatment for continued L LE weakness s/p lumbar laminectomy performed on 09/22/23. Physical findings are consistent with surgery and recovery timeline as pt demonstrates decrease in L LE strength and general decrease in functional mobility. LEFS scores shows she is operating below PLOF for home ADLs and higher level mobility. Pt would benefit from skilled PT services working on improving LE strength and mobility post operatively in order to improve function and decrease L thigh pain.   OBJECTIVE IMPAIRMENTS: Abnormal gait, decreased activity tolerance, decreased balance, decreased endurance, decreased mobility, difficulty walking, decreased ROM, decreased strength, and pain   ACTIVITY LIMITATIONS: standing, squatting, stairs, transfers, and locomotion level  PARTICIPATION LIMITATIONS: meal prep, cleaning, driving, shopping, community activity, and yard work  PERSONAL FACTORS: Time since onset of injury/illness/exacerbation and 3+ comorbidities: L THA, Lumbar decompression, HTN are also affecting patient's functional outcome.   REHAB POTENTIAL: Excellent  CLINICAL DECISION MAKING: Stable/uncomplicated  EVALUATION COMPLEXITY: Low   GOALS: Goals reviewed with patient? No  SHORT TERM GOALS: Target date: 12/11/2023   Pt will be compliant and knowledgeable with initial HEP for improved comfort and carryover Baseline: initial HEP given  Goal status: MET  2.  Pt will self report L LE pain no greater than 5/10 for improved comfort and functional ability Baseline: 8/10 at worst 12/11/23: 7/10 now that I have started coughing  12/25/23: 5/10 at worst  Goal status: MET  LONG TERM GOALS: Target date: 01/15/2024   Pt will improve LEFS to no less than 52/80 as proxy for functional improvement with home ADLs and higher level community activity Baseline: 42/80 01/03/2024: 45/80 01/29/2024: 55/80 Goal status: IN PROGRESS   2.  Pt will self report L LE pain no greater than 1-2/10  for improved comfort and functional ability Baseline: 8/10 at worst 01/03/2024: 7/10 at worst 01/15/24: 5/10 01/29/2024: 5/10 Goal status: PARTIALLY MET  3.  Pt will increase 30 Second Sit to Stand rep count to no less than 15 reps for improved balance, strength, and functional mobility Baseline: 11 reps  12/25/23: 13 reps  01/03/2024: 13 reps 01/29/2024: 14 reps Goal status: MOSTLY MET  4.  Pt will improve L hip flexion MMT to no less  than 4/5 for improved functional mobility and decreased pain Baseline: 2+/5 01/03/2024: 3/5 01/29/2024: 3/5 Goal status: PARTIALLY MET  5.  Pt will improve L active hip flexion in supine to no less than 90 degrees for improved functional mobility Baseline: 45 degrees Goal status: MET   PLAN:  PT FREQUENCY: 2x/week  PT DURATION: 8 weeks  PLANNED INTERVENTIONS: 97164- PT Re-evaluation, 97110-Therapeutic exercises, 97530- Therapeutic activity, [id number]- Neuromuscular re-education, 97535- Self Care, [id number]- Manual therapy, [id number]- Gait training, [phone]- Electrical stimulation (unattended), [id number]- Electrical stimulation (manual), 20560 (1-2 muscles), 20561 (3+ muscles)- Dry Needling, Patient/Family education, Cryotherapy, and Moist heat  PLAN FOR NEXT SESSION: assess HEP response, LE strengthening with emphasis on L hip flexion  Alm JAYSON Kingdom PT  01/29/24 10:55 AM

## 2024-02-01 ENCOUNTER — Encounter: Admitting: Orthopedic Surgery

## 2024-02-05 ENCOUNTER — Encounter: Payer: Self-pay | Admitting: Family

## 2024-02-05 ENCOUNTER — Encounter: Payer: Self-pay | Admitting: Occupational Therapy

## 2024-02-05 ENCOUNTER — Ambulatory Visit: Admitting: Orthopedic Surgery

## 2024-02-05 ENCOUNTER — Ambulatory Visit: Admitting: Occupational Therapy

## 2024-02-05 ENCOUNTER — Ambulatory Visit (INDEPENDENT_AMBULATORY_CARE_PROVIDER_SITE_OTHER): Admitting: Family

## 2024-02-05 ENCOUNTER — Ambulatory Visit: Payer: Self-pay

## 2024-02-05 VITALS — BP 146/90 | HR 81 | Temp 98.0°F | Resp 16 | Ht 65.0 in | Wt 228.6 lb

## 2024-02-05 DIAGNOSIS — I10 Essential (primary) hypertension: Secondary | ICD-10-CM | POA: Diagnosis not present

## 2024-02-05 DIAGNOSIS — R635 Abnormal weight gain: Secondary | ICD-10-CM | POA: Diagnosis not present

## 2024-02-05 DIAGNOSIS — M25641 Stiffness of right hand, not elsewhere classified: Secondary | ICD-10-CM

## 2024-02-05 DIAGNOSIS — R278 Other lack of coordination: Secondary | ICD-10-CM

## 2024-02-05 DIAGNOSIS — M6281 Muscle weakness (generalized): Secondary | ICD-10-CM

## 2024-02-05 DIAGNOSIS — M79641 Pain in right hand: Secondary | ICD-10-CM

## 2024-02-05 DIAGNOSIS — R600 Localized edema: Secondary | ICD-10-CM

## 2024-02-05 DIAGNOSIS — R208 Other disturbances of skin sensation: Secondary | ICD-10-CM

## 2024-02-05 DIAGNOSIS — M25541 Pain in joints of right hand: Secondary | ICD-10-CM | POA: Diagnosis not present

## 2024-02-05 NOTE — Progress Notes (Signed)
 Weight check

## 2024-02-05 NOTE — Patient Instructions (Signed)
 1. Grip Strengthening (Resistive Putty)   Squeeze putty using thumb and all fingers. Repeat _20___ times. Do __2__ sessions per day.   2. Roll putty into tube on table and pinch between first two fingers and thumb x 10 reps. Do 2 sessions per day  3. Finger Extension / Thumb Abduction: Resisted    With rubber band around right thumb and _____all__ fingers, hand slightly cupped, gently spread thumb and fingers apart. Repeat __10__ times per set.  Do __2__ sessions per day.

## 2024-02-05 NOTE — Progress Notes (Signed)
   Darlene Crosby - 58 y.o. female MRN [id number]  Date of birth: 1965/07/08  Office Visit Note: Visit Date: 02/05/2024 PCP: Jaycee Greig PARAS, NP Referred by: Jaycee Greig PARAS, NP  Subjective:  HPI: Darlene Crosby is a 58 y.o. female who presents today for follow up 8 weeks status post right long finger metacarpophalangeal arthroplasty.  Doing well overall, pain is controlled.  He is doing therapy once a week.  Pertinent ROS were reviewed with the patient and found to be negative unless otherwise specified above in HPI.   Assessment & Plan: Visit Diagnoses:  1. Arthralgia of metacarpophalangeal joint, right     Plan: She is doing well with therapy from a range of motion standpoint.  She has a functional brace during the day and an extension orthosis for the long finger at night for some extensor lag.  She does have some ongoing soreness in this region and feels better in the brace at night which is appropriate.  Continue with OT for range of motion and can progress with strengthening at this point.  I am pleased with her progress overall, as is she, return in 1 month.  Follow-up: No follow-ups on file.   Meds & Orders: No orders of the defined types were placed in this encounter.   Orders Placed This Encounter  Procedures   XR Hand Complete Right     Procedures: No procedures performed       Objective:   Vital Signs: LMP 05/04/2012   Ortho Exam Right hand well-healed dorsal incision, no erythema or drainage, digital range of motion is preserved, near composite fist, approximately 1 cm from distal palmar crease actively with the long finger, improved passively, no gross instability at the MCP region  Imaging: XR Hand Complete Right Result Date: 02/05/2024 X-rays of the right hand demonstrate the prior appearance of the metacarpal head of the long finger status post silicone replacement arthroplasty, joint remains well located in all planes of the long finger MCP.     Orlandria Kissner  [name], M.D. [facility], Hand Surgery

## 2024-02-05 NOTE — Progress Notes (Signed)
 Patient ID: Darlene Crosby, female    DOB: Jun 21, 1965  MRN: [id number]  CC: Weight Check  Subjective: Darlene Crosby is a 58 y.o. female who presents for weight check.   Her concerns today include:  - Doing well on Phentermine , no issues/concerns. She is watching what she eats.  - Doing well on Amlodipine , on issues/concerns. She does not complain of red flag symptoms such as but not limited to chest pain, shortness of breath, worst headache of life, nausea/vomiting.   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 04/12/2022   Status post lumbar laminectomy 09/20/2021   Body mass index (BMI) 37.0-37.9, adult 03/14/2019   Lumbar stenosis without neurogenic claudication 03/14/2019   Hyperlipidemia 02/08/2019   Lumbar spondylosis 10/25/2018   Degeneration of lumbar intervertebral disc 10/01/2018   Surgical wound dehiscence 06/06/2018   Osteoarthritis of left hip 04/25/2018   Spinal stenosis of lumbar region 02/23/2018   Pain in left knee 01/10/2018   Trigger thumb of right hand 03/30/2017   Symptomatic mammary hypertrophy 11/28/2014   Dyspnea 03/26/2013   Asthma vs VCD  02/28/2013   Hypertensive disorder 06/07/2012   Pyogenic granuloma 02/15/2012     Current Outpatient Medications on File Prior to Visit  Medication Sig Dispense Refill   albuterol  (PROVENTIL ) (2.5 MG/3ML) 0.083% nebulizer solution Take 3 mLs (2.5 mg total) by nebulization every 6 (six) hours as needed for wheezing or shortness of breath. 150 mL 2   albuterol  (VENTOLIN  HFA) 108 (90 Base) MCG/ACT inhaler Inhale 2 puffs into the lungs every 4 (four) hours as needed (wheezing and SOB). 8 g 2   amLODipine  (NORVASC ) 5 MG tablet Take 1 tablet (5 mg total) by mouth daily. 90 tablet 0   Calcium -Magnesium-Zinc (CAL-MAG-ZINC PO) Take 1 tablet by mouth 2 (two) times daily. (Patient taking differently: Take 1  tablet by mouth daily.)     Cyanocobalamin  (B-12 PO) Take 1 capsule by mouth daily.     diclofenac  (VOLTAREN ) 50 MG EC tablet Take 1 tablet (50 mg total) by mouth 3 (three) times daily. 300 tablet 2   ELDERBERRY PO Take 1 tablet by mouth daily.     fluticasone  (FLONASE ) 50 MCG/ACT nasal spray Place 1 spray into both nostrils every other day.     levocetirizine (XYZAL ) 5 MG tablet TAKE 1 TABLET BY MOUTH ONCE DAILY IN THE EVENING 90 tablet 0   methocarbamol  (ROBAXIN ) 500 MG tablet Take 1 tablet (500 mg total) by mouth 4 (four) times daily. 60 tablet 0   Multiple Vitamin (MULTIVITAMIN WITH MINERALS) TABS tablet Take 1 tablet by mouth daily.     mupirocin  cream (BACTROBAN ) 2 % Apply 1 Application topically 2 (two) times daily. 60 g 1   oxyCODONE  (ROXICODONE ) 5 MG immediate release tablet Take 1 tablet (5 mg total) by mouth every 6 (six) hours as needed. 30 tablet 0   phentermine  37.5 MG capsule Take 1 capsule (37.5 mg total) by mouth every morning. 30 capsule 0   pregabalin  (LYRICA ) 75 MG capsule Take 1 capsule (75 mg total) by mouth 2 (two) times daily. 60 capsule 2   simvastatin  (ZOCOR ) 5 MG tablet Take 1 tablet (5 mg total) by mouth at bedtime. 90 tablet 0   Spacer/Aero-Holding Chambers DEVI 1 Units by Does not apply route in the morning, at noon, and at bedtime. 1 Units 0   Turmeric 500 MG  CAPS Take 500 mg by mouth 2 (two) times daily.     atorvastatin  (LIPITOR) 20 MG tablet Take 1 tablet (20 mg total) by mouth daily. (Patient not taking: Reported on 01/05/2024) 90 tablet 0   No current facility-administered medications on file prior to visit.    Allergies  Allergen Reactions   Pineapple Shortness Of Breath, Swelling and Hives    Swelling of tongue    Chocolate Hives   Coconut (Cocos Nucifera) Hives and Rash   Strawberry Extract Hives and Rash   Wheat Hives   Flavoring Agent Rash    Social History   Socioeconomic History   Marital status: Widowed    Spouse name: Not on file    Number of children: 2   Years of education: Not on file   Highest education level: Associate degree: occupational, scientist, product/process development, or vocational program  Occupational History   Not on file  Tobacco Use   Smoking status: Never    Passive exposure: Never   Smokeless tobacco: Never  Vaping Use   Vaping status: Never Used  Substance and Sexual Activity   Alcohol  use: Yes    Alcohol /week: 1.0 standard drink of alcohol     Types: 1 Glasses of wine per week   Drug use: Never   Sexual activity: Not on file  Other Topics Concern   Not on file  Social History Narrative   Not on file   Social Drivers of Health   Financial Resource Strain: Low Risk  (10/13/2023)   Overall Financial Resource Strain (CARDIA)    Difficulty of Paying Living Expenses: Not hard at all  Food Insecurity: Food Insecurity Present (10/13/2023)   Hunger Vital Sign    Worried About Running Out of Food in the Last Year: Sometimes true    Ran Out of Food in the Last Year: Often true  Transportation Needs: No Transportation Needs (10/13/2023)   PRAPARE - Administrator, Civil Service (Medical): No    Lack of Transportation (Non-Medical): No  Physical Activity: Insufficiently Active (10/13/2023)   Exercise Vital Sign    Days of Exercise per Week: 3 days    Minutes of Exercise per Session: 30 min  Stress: No Stress Concern Present (10/13/2023)   [employer] of Occupational Health - Occupational Stress Questionnaire    Feeling of Stress: Not at all  Social Connections: Moderately Isolated (10/13/2023)   Social Connection and Isolation Panel    Frequency of Communication with Friends and Family: More than three times a week    Frequency of Social Gatherings with Friends and Family: Once a week    Attends Religious Services: More than 4 times per year    Active Member of [employer] or Organizations: No    Attends Banker Meetings: Not on file    Marital Status: Widowed  Intimate Partner Violence: Not At  Risk (04/13/2023)   Humiliation, Afraid, Rape, and Kick questionnaire    Fear of Current or Ex-Partner: No    Emotionally Abused: No    Physically Abused: No    Sexually Abused: No    Family History  Problem Relation Age of Onset   Colon polyps Mother 79   Hypertension Mother    Asthma Mother    Allergies Mother    Breast cancer Sister    Heart disease Sister    Hypertension Sister    Stroke Sister    Diabetes Sister    Colon cancer Neg Hx    Esophageal cancer Neg  Hx    Stomach cancer Neg Hx    Pancreatic cancer Neg Hx    Rectal cancer Neg Hx     Past Surgical History:  Procedure Laterality Date   ANTERIOR HIP REVISION Left 06/06/2018   Procedure: LEFT HIP WOUND DEHISCENCE POSSIBLE HEAD AND LINER EXCHANGE;  Surgeon: Fidel Rogue, MD;  Location: [facility];  Service: Orthopedics;  Laterality: Left;   COLONOSCOPY  2015   DECOMPRESSIVE LUMBAR LAMINECTOMY LEVEL 2 N/A 09/22/2023   Procedure: DECOMPRESSIVE LUMBAR LAMINECTOMY LEVEL 2;  Surgeon: Georgina Ozell LABOR, MD;  Location: [facility];  Service: Orthopedics;  Laterality: N/A;  Lumbar 2 and Lumbar 3 segmental laminectomies with partial medial facetectomies   DIAGNOSTIC LAPAROSCOPY     tubal preg-took ovary and tube   LAPAROSCOPY FOR ECTOPIC PREGNANCY  1990s   LEFT SALPINGECTOMY AND RIGHT OOPHORECTOMY FOR ABNORMALITY   LUMBAR LAMINECTOMY N/A 09/20/2021   Procedure: L3-4 AND L4-5 CENTRAL LUMBAR LAMINECTOMIES;  Surgeon: Lucilla Lynwood BRAVO, MD;  Location: [facility];  Service: Orthopedics;  Laterality: N/A;   MASS EXCISION  02/15/2012   Procedure: EXCISION MASS;  Surgeon: Donnice LABOR Robinsons, MD;  Location: [facility];  Service: Orthopedics;  Laterality: Right;  Excision of Right Small Volar Mass   MYELOGRAM     TONSILLECTOMY  age 18   TOTAL HIP ARTHROPLASTY Left 04/25/2018   Procedure: TOTAL HIP ARTHROPLASTY ANTERIOR APPROACH;  Surgeon: Fidel Rogue, MD;  Location: [facility];  Service: Orthopedics;  Laterality: Left;   TRIGGER FINGER  RELEASE Right 2018   thumb   WISDOM TOOTH EXTRACTION  age 78    ROS: Review of Systems Negative except as stated above  PHYSICAL EXAM: BP (!) 146/90   Pulse 81   Temp 98 F (36.7 C) (Oral)   Resp 16   Ht 5' 5 (1.651 m)   Wt 228 lb 9.6 oz (103.7 kg)   LMP 05/04/2012   SpO2 97%   BMI 38.04 kg/m   Wt Readings from Last 3 Encounters:  02/05/24 228 lb 9.6 oz (103.7 kg)  01/05/24 229 lb 9.6 oz (104.1 kg)  11/23/23 229 lb 12.8 oz (104.2 kg)   Physical Exam HENT:     Head: Normocephalic and atraumatic.     Nose: Nose normal.     Mouth/Throat:     Mouth: Mucous membranes are moist.     Pharynx: Oropharynx is clear.  Eyes:     Extraocular Movements: Extraocular movements intact.     Conjunctiva/sclera: Conjunctivae normal.     Pupils: Pupils are equal, round, and reactive to light.  Cardiovascular:     Rate and Rhythm: Normal rate and regular rhythm.     Pulses: Normal pulses.     Heart sounds: Normal heart sounds.  Pulmonary:     Effort: Pulmonary effort is normal.     Breath sounds: Normal breath sounds.  Musculoskeletal:        General: Normal range of motion.     Cervical back: Normal range of motion and neck supple.  Neurological:     General: No focal deficit present.     Mental Status: She is alert and oriented to person, place, and time.  Psychiatric:        Mood and Affect: Mood normal.        Behavior: Behavior normal.     ASSESSMENT AND PLAN: 1. Primary hypertension (Primary) - Blood pressure not at goal during today's visit. Patient asymptomatic without chest pressure, chest pain, palpitations, shortness of  breath, worst headache of life, and any additional red flag symptoms. - Patient declined pharmacological adjustments.  - Continue Amlodipine  as prescribed. No refills needed as of present. - Counseled on blood pressure goal of less than 130/80, low-sodium, DASH diet, medication compliance, and 150 minutes of moderate intensity exercise per week as  tolerated. Counseled on medication adherence and adverse effects. - Follow-up with primary provider in 4 weeks or sooner if needed.   2. Weight gain - Patient lost 1 pound since previous office visit.  - Hold Phentermine  as of present due to elevated blood pressure (see #1).  - Patient declined referral to Medical Nutrition Therapy.  - Follow-up with primary provider in 4 weeks or sooner if needed.    Patient was given the opportunity to ask questions.  Patient verbalized understanding of the plan and was able to repeat key elements of the plan. Patient was given clear instructions to go to Emergency Department or return to medical center if symptoms don't improve, worsen, or new problems develop.The patient verbalized understanding.   Return in about 4 weeks (around 03/04/2024) for Follow-Up or next available weight check .  Greig JINNY Chute, NP

## 2024-02-05 NOTE — Therapy (Signed)
 OUTPATIENT OCCUPATIONAL THERAPY ORTHO TREATMENT  Patient Name: Darlene Crosby MRN: [id number] DOB:[birth year], 58 y.o., female Today's Date: 02/05/2024  PCP: Jaycee Greig PARAS, NP  REFERRING PROVIDER: Arlinda Buster, MD   END OF SESSION:  OT End of Session - 02/05/24 1104     Visit Number 6    Number of Visits 16    Date for Recertification  03/04/24    Authorization Type [employer] Dual complete - covered 100%    Progress Note Due on Visit 10    OT Start Time 1102    OT Stop Time 1145    OT Time Calculation (min) 43 min    Equipment Utilized During Treatment Fluido, FM objects    Activity Tolerance Patient tolerated treatment well    Behavior During Therapy WFL for tasks assessed/performed          Past Medical History:  Diagnosis Date   Asthma    followed by pcp   Bronchitis    chronic   Hypertension    OA (osteoarthritis)    left knee, right shoulder, left hip   Pneumonia    a couple years ago per pt on 09/15/21   Seasonal allergies    Wears glasses    Past Surgical History:  Procedure Laterality Date   ANTERIOR HIP REVISION Left 06/06/2018   Procedure: LEFT HIP WOUND DEHISCENCE POSSIBLE HEAD AND LINER EXCHANGE;  Surgeon: Fidel Rogue, MD;  Location: [facility];  Service: Orthopedics;  Laterality: Left;   COLONOSCOPY  2015   DECOMPRESSIVE LUMBAR LAMINECTOMY LEVEL 2 N/A 09/22/2023   Procedure: DECOMPRESSIVE LUMBAR LAMINECTOMY LEVEL 2;  Surgeon: Georgina Ozell LABOR, MD;  Location: [facility];  Service: Orthopedics;  Laterality: N/A;  Lumbar 2 and Lumbar 3 segmental laminectomies with partial medial facetectomies   DIAGNOSTIC LAPAROSCOPY     tubal preg-took ovary and tube   LAPAROSCOPY FOR ECTOPIC PREGNANCY  1990s   LEFT SALPINGECTOMY AND RIGHT OOPHORECTOMY FOR ABNORMALITY   LUMBAR LAMINECTOMY N/A 09/20/2021   Procedure: L3-4 AND L4-5 CENTRAL LUMBAR LAMINECTOMIES;  Surgeon: Lucilla Lynwood BRAVO, MD;  Location: [facility];  Service: Orthopedics;  Laterality: N/A;   MASS EXCISION   02/15/2012   Procedure: EXCISION MASS;  Surgeon: Donnice LABOR Robinsons, MD;  [facility];  Service: Orthopedics;  Laterality: Right;  Excision of Right Small Volar Mass   MYELOGRAM     TONSILLECTOMY  age 66   TOTAL HIP ARTHROPLASTY Left 04/25/2018   Procedure: TOTAL HIP ARTHROPLASTY ANTERIOR APPROACH;  Surgeon: Fidel Rogue, MD;  Location: [facility];  Service: Orthopedics;  Laterality: Left;   TRIGGER FINGER RELEASE Right 2018   thumb   WISDOM TOOTH EXTRACTION  age 64   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 04/12/2022   Status post lumbar laminectomy 09/20/2021   Body mass index (BMI) 37.0-37.9, adult 03/14/2019   Lumbar stenosis without neurogenic claudication 03/14/2019   Hyperlipidemia 02/08/2019   Lumbar spondylosis 10/25/2018   Degeneration of lumbar intervertebral disc 10/01/2018   Surgical wound dehiscence 06/06/2018   Osteoarthritis of left hip 04/25/2018   Spinal stenosis of lumbar region 02/23/2018   Pain in left knee 01/10/2018   Trigger thumb of right hand 03/30/2017   Symptomatic mammary hypertrophy 11/28/2014   Dyspnea 03/26/2013   Asthma vs VCD  02/28/2013   Hypertensive disorder 06/07/2012   Pyogenic granuloma 02/15/2012    ONSET DATE: 11/20/23  referral date (surgery 12/07/23)   REFERRING DIAG: M25.541 (ICD-10-CM) - Arthralgia of metacarpophalangeal joint, right   Note: Needs OT 2 weeks s/p right long finger MCP arthroplasty  SPLINT  Imaging: XR Hand Complete Right Result Date: 12/21/2023 Stable appearance of the long finger MCP region status post prior resection arthroplasty, appropriate spacing is notable between the metacarpal head and proximal phalanx base  THERAPY DIAG:  Muscle weakness (generalized)  Stiffness of right hand, not elsewhere classified  Other lack of coordination  Other disturbances of skin  sensation  Pain in right hand  Localized edema  Rationale for Evaluation and Treatment: Rehabilitation  SUBJECTIVE:   SUBJECTIVE STATEMENT: Pt reports she saw MD - he says I can take off clam shell at home, but to wear when out. He also likes the one you made me for night time. He also is ok with us  starting putty. It was hurting this morning because of the cold, but better now    Pt accompanied by: self  PERTINENT HISTORY: Lt THA 2020, decompression of lumbar spline 08/2023 (L2-3)   PRECAUTIONS: Other: per protocol Rt hand, no lifting over 20 lbs d/t back    WEIGHT BEARING RESTRICTIONS: NWB through Rt hand  PAIN:  Are you having pain? No, just stiffness and achy and tender Rt middle finger around PIP joint  FALLS: Has patient fallen in last 6 months? No  LIVING ENVIRONMENT: Lives with: lives with 40 y.o. grandson Lives in: 1 story home, 1 step to enter Has following equipment at home: Single point cane, Environmental Consultant - 2 wheeled, and bed side commode  PLOF: Independent and on disability d/t hip   PATIENT GOALS: Get use of my Rt hand back  NEXT MD VISIT: 02/01/24  OBJECTIVE:  Note: Objective measures were completed at Evaluation unless otherwise noted.  HAND DOMINANCE: Right  ADLs: Overall ADLs: Mod I for BADLS and IADLS w/ Lt hand and difficulty  FUNCTIONAL OUTCOME MEASURES: 01/09/24: [name]: 52.27% Deficit RUE   UPPER EXTREMITY ROM:   BUE AROM WNLs at shoulders, elbows, and forearms  Active ROM Right eval Left eval  Shoulder flexion    Shoulder abduction    Shoulder adduction    Shoulder extension    Shoulder internal rotation    Shoulder external rotation    Elbow flexion    Elbow extension    Wrist flexion 45   Wrist extension 35   Wrist ulnar deviation    Wrist radial deviation    Wrist pronation    Wrist supination    (Blank rows = not tested)  Active ROM Right eval Left eval  Thumb MCP (0-60)    Thumb IP (0-80)    Thumb Radial abd/add  (0-55)     Thumb Palmar abd/add (0-45)     Thumb Opposition to Small Finger     Index MCP (0-90)     Index PIP (0-100)     Index DIP (0-70)      Long MCP (0-90) 54*, -15*     Long PIP (0-100) 75*, 0*     Long DIP (0-70)      Ring MCP (0-90)      Ring PIP (0-100)      Ring DIP (0-70)      Little MCP (0-90)      Little PIP (0-100)      Little DIP (0-70)      (Blank rows = not tested)  01/17/24: Rt long finger MP joint  = 65*, -  20*  Rt long finger PIP joint = 90*, -10*   UPPER EXTREMITY MMT:   NT   HAND FUNCTION: Grip strength TBD once precautions lifted  COORDINATION: Impaired d/t edema and decreased ROM   SENSATION: Not formally assessed  EDEMA: moderate dorsal hand and in fingers (long finger worse)  COGNITION: Overall cognitive status: Within functional limits for tasks assessed  OBSERVATIONS: Pt came directly from MD office w/ gauze dressing wrapped around hand.    TREATMENT DATE: 02/05/24                                                                                                                            Initiated putty HEP with yellow resistance - pt tolerated well. (Ok by MD) See pt instructions for details. Modified light resistive finger extension to do with thin rubberband   Fluidotherapy x 12 minutes for Rt to address pain, swelling, and stiffness. No adverse reactions.   Scar massage along scar x 5 minutes  PATIENT EDUCATION: Education details: putty HEP  Person educated: Patient Education method: Explanation, Demonstration, Verbal cues, and Handouts Education comprehension: verbalized understanding, returned demonstration, and verbal cues required  HOME EXERCISE PROGRAM: 01/04/24: A/ROM HEP for Rt hand/wrist 01/09/24: P/ROM HEP, scar massage 01/24/24: Coordination Activities 02/05/24: Putty HEP   GOALS: Goals reviewed with patient? Yes  SHORT TERM GOALS: Target date: 02/03/24  Independent with splint wear and care Baseline: Goal  status: MET  2.  Independent with ROM HEP  Baseline:  Goal status: MET  3.  Pt to verbalize understanding of edema management strategies  Baseline:  Goal status: IN PROGRESS - pt now tolerating tensogrip stockinette  4.  Pt to improve in Rt long finger MP flexion and PIP flexion by 10* or more Baseline: 54*, 75*  Goal status: MET (01/17/24 = 65*, 90*)   5.  Pt to improve Rt long finger MP extension to -5*  Baseline: -15* Goal status: IN PROGRESS   LONG TERM GOALS: Target date: 03/04/24  Independent with updated strengthening HEP  Baseline:  Goal status: IN PROGRESS  2.  Pt to demo ROM Rt hand WFL's to make fist for full grasp/release Baseline:  Goal status: IN Progress  3.  Pt to return to using Rt hand as dominant hand for ADLS and bilateral tasks Baseline:  Goal status: IN Progress  4.  Pt to demo 50% or greater Rt grip strength compared to Lt hand Baseline:  Goal status: IN Progress  5.  Quick Dash to be 20% or less deficit RUE Baseline: 52.27%  Goal status: REVISED   ASSESSMENT:  CLINICAL IMPRESSION: Patient seen today for occupational therapy treatment for s/p Rt long finger MCP arthroplasty. Patient cleared to begin light strengthening and tolerated putty HEP well today. Pt will benefit from skilled OT services in the outpatient setting to decreased splint wear appropriately and help pt return to PLOF as able.    PERFORMANCE DEFICITS: in functional skills including ADLs, IADLs,  coordination, dexterity, sensation, edema, ROM, strength, pain, fascial restrictions, Fine motor control, Gross motor control, decreased knowledge of precautions, and UE functional use.   IMPAIRMENTS: are limiting patient from ADLs, IADLs, leisure, and social participation.   COMORBIDITIES: may have co-morbidities  that affects occupational performance. Patient will benefit from skilled OT to address above impairments and improve overall function.  REHAB POTENTIAL: Good  PLAN:  OT  FREQUENCY: 1-2x/week (may begin at 1x/wk then increase to 2x/wk)  OT DURATION: 8 weeks  PLANNED INTERVENTIONS: 97535 self care/ADL training, 02889 therapeutic exercise, 97530 therapeutic activity, 97140 manual therapy, 97035 ultrasound, 97018 paraffin, 02960 fluidotherapy, 97010 moist heat, 97010 cryotherapy, 97034 contrast bath, 97760 Orthotic Initial, 97763 Orthotic/Prosthetic subsequent, scar mobilization, passive range of motion, compression bandaging, patient/family education, and DME and/or AE instructions  RECOMMENDED OTHER SERVICES: none at this time  CONSULTED AND AGREED WITH PLAN OF CARE: Patient  PLAN FOR NEXT SESSION:  Review putty HEP  Fluidotherapy ROM  Burnard JINNY Roads, OT 02/05/2024, 11:04 AM

## 2024-02-07 ENCOUNTER — Encounter: Payer: Self-pay | Admitting: Occupational Therapy

## 2024-02-07 ENCOUNTER — Other Ambulatory Visit: Payer: Self-pay | Admitting: Orthopedic Surgery

## 2024-02-07 ENCOUNTER — Ambulatory Visit: Admitting: Occupational Therapy

## 2024-02-07 DIAGNOSIS — M79641 Pain in right hand: Secondary | ICD-10-CM

## 2024-02-07 DIAGNOSIS — M25641 Stiffness of right hand, not elsewhere classified: Secondary | ICD-10-CM | POA: Diagnosis not present

## 2024-02-07 DIAGNOSIS — R278 Other lack of coordination: Secondary | ICD-10-CM

## 2024-02-07 DIAGNOSIS — M6281 Muscle weakness (generalized): Secondary | ICD-10-CM

## 2024-02-07 DIAGNOSIS — R600 Localized edema: Secondary | ICD-10-CM

## 2024-02-07 NOTE — Therapy (Signed)
 OUTPATIENT OCCUPATIONAL THERAPY ORTHO TREATMENT  Patient Name: Darlene Crosby MRN: [id number] DOB:1965/05/10, 58 y.o., female Today's Date: 02/07/2024  PCP: Darlene Greig PARAS, NP  REFERRING PROVIDER: Arlinda Buster, MD   END OF SESSION:  OT End of Session - 02/07/24 1032     Visit Number 7    Number of Visits 16    Date for Recertification  03/04/24    Authorization Type [employer] Dual complete - covered 100%    Progress Note Due on Visit 10    OT Start Time 1020    OT Stop Time 1100    OT Time Calculation (min) 40 min    Equipment Utilized During Treatment Fluido, FM objects    Activity Tolerance Patient tolerated treatment well    Behavior During Therapy WFL for tasks assessed/performed          Past Medical History:  Diagnosis Date   Asthma    followed by pcp   Bronchitis    chronic   Hypertension    OA (osteoarthritis)    left knee, right shoulder, left hip   Pneumonia    a couple years ago per pt on 09/15/21   Seasonal allergies    Wears glasses    Past Surgical History:  Procedure Laterality Date   ANTERIOR HIP REVISION Left 06/06/2018   Procedure: LEFT HIP WOUND DEHISCENCE POSSIBLE HEAD AND LINER EXCHANGE;  Surgeon: Fidel Rogue, MD;  Location: [facility];  Service: Orthopedics;  Laterality: Left;   COLONOSCOPY  2015   DECOMPRESSIVE LUMBAR LAMINECTOMY LEVEL 2 N/A 09/22/2023   Procedure: DECOMPRESSIVE LUMBAR LAMINECTOMY LEVEL 2;  Surgeon: Georgina Ozell LABOR, MD;  Location: [facility];  Service: Orthopedics;  Laterality: N/A;  Lumbar 2 and Lumbar 3 segmental laminectomies with partial medial facetectomies   DIAGNOSTIC LAPAROSCOPY     tubal preg-took ovary and tube   LAPAROSCOPY FOR ECTOPIC PREGNANCY  1990s   LEFT SALPINGECTOMY AND RIGHT OOPHORECTOMY FOR ABNORMALITY   LUMBAR LAMINECTOMY N/A 09/20/2021   Procedure: L3-4 AND L4-5 CENTRAL LUMBAR LAMINECTOMIES;  Surgeon: Lucilla Lynwood BRAVO, MD;  Location: [facility];  Service: Orthopedics;  Laterality: N/A;   MASS EXCISION   02/15/2012   Procedure: EXCISION MASS;  Surgeon: Donnice LABOR Robinsons, MD;  Location: [facility];  Service: Orthopedics;  Laterality: Right;  Excision of Right Small Volar Mass   MYELOGRAM     TONSILLECTOMY  age 36   TOTAL HIP ARTHROPLASTY Left 04/25/2018   Procedure: TOTAL HIP ARTHROPLASTY ANTERIOR APPROACH;  Surgeon: Fidel Rogue, MD;  Location: [facility];  Service: Orthopedics;  Laterality: Left;   TRIGGER FINGER RELEASE Right 2018   thumb   WISDOM TOOTH EXTRACTION  age 55   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 04/12/2022   Status post lumbar laminectomy 09/20/2021   Body mass index (BMI) 37.0-37.9, adult 03/14/2019   Lumbar stenosis without neurogenic claudication 03/14/2019   Hyperlipidemia 02/08/2019   Lumbar spondylosis 10/25/2018   Degeneration of lumbar intervertebral disc 10/01/2018   Surgical wound dehiscence 06/06/2018   Osteoarthritis of left hip 04/25/2018   Spinal stenosis of lumbar region 02/23/2018   Pain in left knee 01/10/2018   Trigger thumb of right hand 03/30/2017   Symptomatic mammary hypertrophy 11/28/2014   Dyspnea 03/26/2013   Asthma vs VCD  02/28/2013   Hypertensive disorder 06/07/2012   Pyogenic granuloma 02/15/2012    ONSET DATE: 11/20/23  referral date (surgery 12/07/23)   REFERRING DIAG: M25.541 (ICD-10-CM) - Arthralgia of metacarpophalangeal joint, right   Note: Needs OT 2 weeks s/p right long finger MCP arthroplasty  SPLINT  Imaging: XR Hand Complete Right Result Date: 12/21/2023 Stable appearance of the long finger MCP region status post prior resection arthroplasty, appropriate spacing is notable between the metacarpal head and proximal phalanx base  THERAPY DIAG:  Stiffness of right hand, not elsewhere classified  Other lack of coordination  Muscle weakness (generalized)  Pain in right  hand  Localized edema  Rationale for Evaluation and Treatment: Rehabilitation  SUBJECTIVE:   SUBJECTIVE STATEMENT: Pt reports no pain, but stiffness due to cold weather. Fluido helps with pain/stiffness   Pt accompanied by: self  PERTINENT HISTORY: Lt THA 2020, decompression of lumbar spline 08/2023 (L2-3)   PRECAUTIONS: Other: per protocol Rt hand, no lifting over 20 lbs d/t back    WEIGHT BEARING RESTRICTIONS: NWB through Rt hand  PAIN:  Are you having pain? No, just stiffness and achy and tender Rt middle finger around PIP joint  FALLS: Has patient fallen in last 6 months? No  LIVING ENVIRONMENT: Lives with: lives with 106 y.o. grandson Lives in: 1 story home, 1 step to enter Has following equipment at home: Single point cane, Environmental Consultant - 2 wheeled, and bed side commode  PLOF: Independent and on disability d/t hip   PATIENT GOALS: Get use of my Rt hand back  NEXT MD VISIT: 02/01/24  OBJECTIVE:  Note: Objective measures were completed at Evaluation unless otherwise noted.  HAND DOMINANCE: Right  ADLs: Overall ADLs: Mod I for BADLS and IADLS w/ Lt hand and difficulty  FUNCTIONAL OUTCOME MEASURES: 01/09/24: [name]: 52.27% Deficit RUE   UPPER EXTREMITY ROM:   BUE AROM WNLs at shoulders, elbows, and forearms  Active ROM Right eval Left eval  Shoulder flexion    Shoulder abduction    Shoulder adduction    Shoulder extension    Shoulder internal rotation    Shoulder external rotation    Elbow flexion    Elbow extension    Wrist flexion 45   Wrist extension 35   Wrist ulnar deviation    Wrist radial deviation    Wrist pronation    Wrist supination    (Blank rows = not tested)  Active ROM Right eval Left eval  Thumb MCP (0-60)    Thumb IP (0-80)    Thumb Radial abd/add (0-55)     Thumb Palmar abd/add (0-45)     Thumb Opposition to Small Finger     Index MCP (0-90)     Index PIP (0-100)     Index DIP (0-70)      Long MCP (0-90) 54*, -15*      Long PIP (0-100) 75*, 0*     Long DIP (0-70)      Ring MCP (0-90)      Ring PIP (0-100)      Ring DIP (0-70)      Little MCP (0-90)      Little PIP (0-100)      Little DIP (0-70)      (Blank rows = not tested)  01/17/24: Rt long finger MP joint  = 65*, - 20*  Rt long finger PIP joint = 90*, -10*   UPPER EXTREMITY MMT:   NT   HAND FUNCTION: Grip strength TBD once precautions lifted  COORDINATION: Impaired d/t edema and decreased ROM   SENSATION: Not formally assessed  EDEMA: moderate  dorsal hand and in fingers (long finger worse)  COGNITION: Overall cognitive status: Within functional limits for tasks assessed  OBSERVATIONS: Pt came directly from MD office w/ gauze dressing wrapped around hand.    TREATMENT DATE: 02/07/24                                                                                                                            Fluidotherapy x 12 minutes at beginning of session for Rt to address  swelling and stiffness. No adverse reactions.   Reviewed putty HEP with yellow resistance - pt tolerated well. Pt instructed not to do more than 2x/day or go beyond recommended reps  Reviewed pen rolling ex for intrinsic +, intrinsic -, and composite flexion ex's  Attempted isolated finger extension Rt LF however unable to do. Added buddy strap (strapping LF to IF) and pt able to do some with assist from index finger  PATIENT EDUCATION: Education details: putty HEP  Person educated: Patient Education method: Explanation, Demonstration, Verbal cues, and Handouts Education comprehension: verbalized understanding, returned demonstration, and verbal cues required  HOME EXERCISE PROGRAM: 01/04/24: A/ROM HEP for Rt hand/wrist 01/09/24: P/ROM HEP, scar massage 01/24/24: Coordination Activities 02/05/24: Putty HEP   GOALS: Goals reviewed with patient? Yes  SHORT TERM GOALS: Target date: 02/03/24  Independent with splint wear and care Baseline: Goal status:  MET  2.  Independent with ROM HEP  Baseline:  Goal status: MET  3.  Pt to verbalize understanding of edema management strategies  Baseline:  Goal status: IN PROGRESS - pt now tolerating tensogrip stockinette  4.  Pt to improve in Rt long finger MP flexion and PIP flexion by 10* or more Baseline: 54*, 75*  Goal status: MET (01/17/24 = 65*, 90*)   5.  Pt to improve Rt long finger MP extension to -5*  Baseline: -15* Goal status: IN PROGRESS   LONG TERM GOALS: Target date: 03/04/24  Independent with updated strengthening HEP  Baseline:  Goal status: IN PROGRESS  2.  Pt to demo ROM Rt hand WFL's to make fist for full grasp/release Baseline:  Goal status: IN Progress  3.  Pt to return to using Rt hand as dominant hand for ADLS and bilateral tasks Baseline:  Goal status: IN Progress  4.  Pt to demo 50% or greater Rt grip strength compared to Lt hand Baseline:  Goal status: IN Progress  5.  Quick Dash to be 20% or less deficit RUE Baseline: 52.27%  Goal status: REVISED   ASSESSMENT:  CLINICAL IMPRESSION: Patient seen today for occupational therapy treatment for s/p Rt long finger MCP arthroplasty. Patient doing well, however lacks full extension Rt long finger.  Pt will benefit from skilled OT services in the outpatient setting to decreased splint wear appropriately and help pt return to PLOF as able.    PERFORMANCE DEFICITS: in functional skills including ADLs, IADLs, coordination, dexterity, sensation, edema, ROM, strength, pain, fascial restrictions, Fine motor control, Gross  motor control, decreased knowledge of precautions, and UE functional use.   IMPAIRMENTS: are limiting patient from ADLs, IADLs, leisure, and social participation.   COMORBIDITIES: may have co-morbidities  that affects occupational performance. Patient will benefit from skilled OT to address above impairments and improve overall function.  REHAB POTENTIAL: Good  PLAN:  OT FREQUENCY: 1-2x/week  (may begin at 1x/wk then increase to 2x/wk)  OT DURATION: 8 weeks  PLANNED INTERVENTIONS: 97535 self care/ADL training, 02889 therapeutic exercise, 97530 therapeutic activity, 97140 manual therapy, 97035 ultrasound, 97018 paraffin, 02960 fluidotherapy, 97010 moist heat, 97010 cryotherapy, 97034 contrast bath, 97760 Orthotic Initial, 97763 Orthotic/Prosthetic subsequent, scar mobilization, passive range of motion, compression bandaging, patient/family education, and DME and/or AE instructions  RECOMMENDED OTHER SERVICES: none at this time  CONSULTED AND AGREED WITH PLAN OF CARE: Patient  PLAN FOR NEXT SESSION:  Fluido Assess grip strength Gripper activity Continue extension ex's for Rt LF (Place and hold, buddy strap to IF)   Burnard JINNY Roads, OT 02/07/2024, 10:32 AM

## 2024-02-08 NOTE — Telephone Encounter (Signed)
 Patient was here for a visit

## 2024-02-13 ENCOUNTER — Ambulatory Visit: Admitting: Occupational Therapy

## 2024-02-13 ENCOUNTER — Encounter: Payer: Self-pay | Admitting: Occupational Therapy

## 2024-02-13 DIAGNOSIS — R208 Other disturbances of skin sensation: Secondary | ICD-10-CM

## 2024-02-13 DIAGNOSIS — M25641 Stiffness of right hand, not elsewhere classified: Secondary | ICD-10-CM

## 2024-02-13 DIAGNOSIS — R600 Localized edema: Secondary | ICD-10-CM

## 2024-02-13 DIAGNOSIS — M79641 Pain in right hand: Secondary | ICD-10-CM

## 2024-02-13 DIAGNOSIS — M6281 Muscle weakness (generalized): Secondary | ICD-10-CM

## 2024-02-13 DIAGNOSIS — R278 Other lack of coordination: Secondary | ICD-10-CM

## 2024-02-13 NOTE — Therapy (Signed)
 OUTPATIENT OCCUPATIONAL THERAPY ORTHO TREATMENT  Patient Name: Darlene Crosby MRN: [id number] DOB:1965/08/05, 58 y.o., female Today's Date: 02/13/2024  PCP: Jaycee Greig PARAS, NP  REFERRING PROVIDER: Arlinda Buster, MD   END OF SESSION:  OT End of Session - 02/13/24 1106     Visit Number 8    Number of Visits 16    Date for Recertification  03/04/24    Authorization Type [employer] Dual complete - covered 100%    Progress Note Due on Visit 10    OT Start Time 1100    OT Stop Time 1145    OT Time Calculation (min) 45 min    Equipment Utilized During Treatment Fluido, FM objects    Activity Tolerance Patient tolerated treatment well    Behavior During Therapy WFL for tasks assessed/performed          Past Medical History:  Diagnosis Date   Asthma    followed by pcp   Bronchitis    chronic   Hypertension    OA (osteoarthritis)    left knee, right shoulder, left hip   Pneumonia    a couple years ago per pt on 09/15/21   Seasonal allergies    Wears glasses    Past Surgical History:  Procedure Laterality Date   ANTERIOR HIP REVISION Left 06/06/2018   Procedure: LEFT HIP WOUND DEHISCENCE POSSIBLE HEAD AND LINER EXCHANGE;  Surgeon: Fidel Rogue, MD;  Location: [facility];  Service: Orthopedics;  Laterality: Left;   COLONOSCOPY  2015   DECOMPRESSIVE LUMBAR LAMINECTOMY LEVEL 2 N/A 09/22/2023   Procedure: DECOMPRESSIVE LUMBAR LAMINECTOMY LEVEL 2;  Surgeon: Georgina Ozell LABOR, MD;  Location: [facility];  Service: Orthopedics;  Laterality: N/A;  Lumbar 2 and Lumbar 3 segmental laminectomies with partial medial facetectomies   DIAGNOSTIC LAPAROSCOPY     tubal preg-took ovary and tube   LAPAROSCOPY FOR ECTOPIC PREGNANCY  1990s   LEFT SALPINGECTOMY AND RIGHT OOPHORECTOMY FOR ABNORMALITY   LUMBAR LAMINECTOMY N/A 09/20/2021   Procedure: L3-4 AND L4-5 CENTRAL LUMBAR LAMINECTOMIES;  Surgeon: Lucilla Lynwood BRAVO, MD;  Location: [facility];  Service: Orthopedics;  Laterality: N/A;   MASS EXCISION   02/15/2012   Procedure: EXCISION MASS;  Surgeon: Donnice LABOR Robinsons, MD;  Location: [facility];  Service: Orthopedics;  Laterality: Right;  Excision of Right Small Volar Mass   MYELOGRAM     TONSILLECTOMY  age 23   TOTAL HIP ARTHROPLASTY Left 04/25/2018   Procedure: TOTAL HIP ARTHROPLASTY ANTERIOR APPROACH;  Surgeon: Fidel Rogue, MD;  Location: [facility];  Service: Orthopedics;  Laterality: Left;   TRIGGER FINGER RELEASE Right 2018   thumb   WISDOM TOOTH EXTRACTION  age 1   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 04/12/2022   Status post lumbar laminectomy 09/20/2021   Body mass index (BMI) 37.0-37.9, adult 03/14/2019   Lumbar stenosis without neurogenic claudication 03/14/2019   Hyperlipidemia 02/08/2019   Lumbar spondylosis 10/25/2018   Degeneration of lumbar intervertebral disc 10/01/2018   Surgical wound dehiscence 06/06/2018   Osteoarthritis of left hip 04/25/2018   Spinal stenosis of lumbar region 02/23/2018   Pain in left knee 01/10/2018   Trigger thumb of right hand 03/30/2017   Symptomatic mammary hypertrophy 11/28/2014   Dyspnea 03/26/2013   Asthma vs VCD  02/28/2013   Hypertensive disorder 06/07/2012   Pyogenic granuloma 02/15/2012    ONSET DATE: 11/20/23  referral date (surgery 12/07/23)   REFERRING DIAG: M25.541 (ICD-10-CM) - Arthralgia of metacarpophalangeal joint, right   Note: Needs OT 2 weeks s/p right long finger MCP arthroplasty  SPLINT  Imaging: XR Hand Complete Right Result Date: 12/21/2023 Stable appearance of the long finger MCP region status post prior resection arthroplasty, appropriate spacing is notable between the metacarpal head and proximal phalanx base  THERAPY DIAG:  Stiffness of right hand, not elsewhere classified  Other lack of coordination  Muscle weakness (generalized)  Pain in right  hand  Localized edema  Other disturbances of skin sensation  Rationale for Evaluation and Treatment: Rehabilitation  SUBJECTIVE:   SUBJECTIVE STATEMENT: I'm doing pretty good, no pain. I'm wrapping gifts, vacuuming, getting my grandson dressed. Actually, my other hand index finger is trying to lock up on me   Pt accompanied by: self  PERTINENT HISTORY: Lt THA 2020, decompression of lumbar spline 08/2023 (L2-3)   PRECAUTIONS: Other: per protocol Rt hand, no lifting over 20 lbs d/t back    WEIGHT BEARING RESTRICTIONS: NWB through Rt hand  PAIN:  Are you having pain? No, just stiffness and achy and tender Rt middle finger around PIP joint  FALLS: Has patient fallen in last 6 months? No  LIVING ENVIRONMENT: Lives with: lives with 88 y.o. grandson Lives in: 1 story home, 1 step to enter Has following equipment at home: Single point cane, Environmental Consultant - 2 wheeled, and bed side commode  PLOF: Independent and on disability d/t hip   PATIENT GOALS: Get use of my Rt hand back  NEXT MD VISIT: 02/01/24  OBJECTIVE:  Note: Objective measures were completed at Evaluation unless otherwise noted.  HAND DOMINANCE: Right  ADLs: Overall ADLs: Mod I for BADLS and IADLS w/ Lt hand and difficulty  FUNCTIONAL OUTCOME MEASURES: 01/09/24: [name]: 52.27% Deficit RUE   UPPER EXTREMITY ROM:   BUE AROM WNLs at shoulders, elbows, and forearms  Active ROM Right eval Left eval  Shoulder flexion    Shoulder abduction    Shoulder adduction    Shoulder extension    Shoulder internal rotation    Shoulder external rotation    Elbow flexion    Elbow extension    Wrist flexion 45   Wrist extension 35   Wrist ulnar deviation    Wrist radial deviation    Wrist pronation    Wrist supination    (Blank rows = not tested)  Active ROM Right eval Left eval  Thumb MCP (0-60)    Thumb IP (0-80)    Thumb Radial abd/add (0-55)     Thumb Palmar abd/add (0-45)     Thumb Opposition to Small  Finger     Index MCP (0-90)     Index PIP (0-100)     Index DIP (0-70)      Long MCP (0-90) 54*, -15*     Long PIP (0-100) 75*, 0*     Long DIP (0-70)      Ring MCP (0-90)      Ring PIP (0-100)      Ring DIP (0-70)      Little MCP (0-90)      Little PIP (0-100)      Little DIP (0-70)      (Blank rows = not tested)  01/17/24: Rt long finger MP joint  = 65*, - 20*  Rt long finger PIP joint = 90*, -10*   UPPER EXTREMITY MMT:   NT   HAND FUNCTION: Grip strength TBD once  precautions lifted 02/13/24: Rt = 28.8 lbs, Lt = 54.6 lbs  COORDINATION: Impaired d/t edema and decreased ROM   SENSATION: Not formally assessed  EDEMA: moderate dorsal hand and in fingers (long finger worse)  COGNITION: Overall cognitive status: Within functional limits for tasks assessed  OBSERVATIONS: Pt came directly from MD office w/ gauze dressing wrapped around hand.    TREATMENT DATE: 02/13/24                                                                                                                            Paraffin x 10 minutes at beginning of session for Rt to address swelling and stiffness. No adverse reactions.   Reviewed blocking and reverse blocking ex's, as well as different positions with pen rolling ex (taco, hook, fist). Also worked on intrinsic (-) to full fist and back to intrinsic (-) to work on isolated MP extension  Performed putty ex's grip and pinch strength, as well as resistive finger extension with rubber band. Pt can double up rubberbands for composite extension, but instructed to use only 1 rubber band for LF extension  Pt did not bring buddy strap to work on long finger extension with index finger  PATIENT EDUCATION: Education details: putty HEP  Person educated: Patient Education method: Programmer, Multimedia, Facilities Manager, Verbal cues, and Handouts Education comprehension: verbalized understanding, returned demonstration, and verbal cues required  HOME EXERCISE  PROGRAM: 01/04/24: A/ROM HEP for Rt hand/wrist 01/09/24: P/ROM HEP, scar massage 01/24/24: Coordination Activities 02/05/24: Putty HEP   GOALS: Goals reviewed with patient? Yes  SHORT TERM GOALS: Target date: 02/03/24  Independent with splint wear and care Baseline: Goal status: MET  2.  Independent with ROM HEP  Baseline:  Goal status: MET  3.  Pt to verbalize understanding of edema management strategies  Baseline:  Goal status: MET  4.  Pt to improve in Rt long finger MP flexion and PIP flexion by 10* or more Baseline: 54*, 75*  Goal status: MET (01/17/24 = 65*, 90*)   5.  Pt to improve Rt long finger MP extension to -5*  Baseline: -15* 02/13/24 = -10*  Goal status: IN PROGRESS   LONG TERM GOALS: Target date: 03/04/24  Independent with updated strengthening HEP  Baseline:  Goal status: MET   2.  Pt to demo ROM Rt hand WFL's to make fist for full grasp/release Baseline:  Goal status: IN Progress  3.  Pt to return to using Rt hand as dominant hand for ADLS and bilateral tasks Baseline:  Goal status: IN Progress  4.  Pt to demo 50% or greater Rt grip strength compared to Lt hand Baseline:  Goal status: MET   5.  Quick Dash to be 20% or less deficit RUE Baseline: 52.27%  Goal status: REVISED   ASSESSMENT:  CLINICAL IMPRESSION: Patient seen today for occupational therapy treatment for s/p Rt long finger MCP arthroplasty. Patient doing well, however lacks full extension Rt long finger. Pt has  met 4/5 STG's and 2 LTG's at this time. Pt will benefit from skilled OT services in the outpatient setting to decreased splint wear appropriately and help pt return to PLOF as able.    PERFORMANCE DEFICITS: in functional skills including ADLs, IADLs, coordination, dexterity, sensation, edema, ROM, strength, pain, fascial restrictions, Fine motor control, Gross motor control, decreased knowledge of precautions, and UE functional use.   IMPAIRMENTS: are limiting patient from  ADLs, IADLs, leisure, and social participation.   COMORBIDITIES: may have co-morbidities  that affects occupational performance. Patient will benefit from skilled OT to address above impairments and improve overall function.  REHAB POTENTIAL: Good  PLAN:  OT FREQUENCY: 1-2x/week (may begin at 1x/wk then increase to 2x/wk)  OT DURATION: 8 weeks  PLANNED INTERVENTIONS: 97535 self care/ADL training, 02889 therapeutic exercise, 97530 therapeutic activity, 97140 manual therapy, 97035 ultrasound, 97018 paraffin, 02960 fluidotherapy, 97010 moist heat, 97010 cryotherapy, 97034 contrast bath, 97760 Orthotic Initial, 97763 Orthotic/Prosthetic subsequent, scar mobilization, passive range of motion, compression bandaging, patient/family education, and DME and/or AE instructions  RECOMMENDED OTHER SERVICES: none at this time  CONSULTED AND AGREED WITH PLAN OF CARE: Patient  PLAN FOR NEXT SESSION:  Fluido or paraffin Gripper activity Continue extension ex's for Rt LF (Place and hold, buddy strap to IF)   Burnard JINNY Roads, OT 02/13/2024, 11:06 AM

## 2024-02-15 ENCOUNTER — Encounter: Payer: Self-pay | Admitting: Occupational Therapy

## 2024-02-15 ENCOUNTER — Ambulatory Visit: Admitting: Occupational Therapy

## 2024-02-15 DIAGNOSIS — M25641 Stiffness of right hand, not elsewhere classified: Secondary | ICD-10-CM

## 2024-02-15 DIAGNOSIS — M6281 Muscle weakness (generalized): Secondary | ICD-10-CM

## 2024-02-15 DIAGNOSIS — R600 Localized edema: Secondary | ICD-10-CM

## 2024-02-15 DIAGNOSIS — R208 Other disturbances of skin sensation: Secondary | ICD-10-CM

## 2024-02-15 DIAGNOSIS — R278 Other lack of coordination: Secondary | ICD-10-CM

## 2024-02-15 NOTE — Patient Instructions (Signed)
 Focus on these exercises:   1) Blocking ex: keep big knuckles straight - bend fingers only (hook)  2) Reverse blocking (taco position) : Keep wrist up, big knuckles stay bent - bend and straighten fingers with focus on middle joint of long finger straightening while keeping big knuckle bent  3) Strap fingers bent with strap provided - bend and straighten big knuckles (fingers strapped bent)  4) Hand flat on table with long finger strapped to index - lift fingers (not palm) off table  5) Pen rolling exercise - taco, hook, full fist, back to hook then back to taco

## 2024-02-15 NOTE — Therapy (Signed)
 OUTPATIENT OCCUPATIONAL THERAPY ORTHO TREATMENT  Patient Name: Darlene Crosby MRN: [id number] DOB:Apr 06, 1965, 58 y.o., female Today's Date: 02/15/2024  PCP: Jaycee Greig PARAS, NP  REFERRING PROVIDER: Arlinda Buster, MD   END OF SESSION:  OT End of Session - 02/15/24 1109     Visit Number 9    Number of Visits 16    Date for Recertification  03/04/24    Authorization Type [employer] Dual complete - covered 100%    Progress Note Due on Visit 10    OT Start Time 1100    OT Stop Time 1145    OT Time Calculation (min) 45 min    Equipment Utilized During Treatment Fluido, FM objects    Activity Tolerance Patient tolerated treatment well    Behavior During Therapy WFL for tasks assessed/performed          Past Medical History:  Diagnosis Date   Asthma    followed by pcp   Bronchitis    chronic   Hypertension    OA (osteoarthritis)    left knee, right shoulder, left hip   Pneumonia    a couple years ago per pt on 09/15/21   Seasonal allergies    Wears glasses    Past Surgical History:  Procedure Laterality Date   ANTERIOR HIP REVISION Left 06/06/2018   Procedure: LEFT HIP WOUND DEHISCENCE POSSIBLE HEAD AND LINER EXCHANGE;  Surgeon: Fidel Rogue, MD;  Location: [facility];  Service: Orthopedics;  Laterality: Left;   COLONOSCOPY  2015   DECOMPRESSIVE LUMBAR LAMINECTOMY LEVEL 2 N/A 09/22/2023   Procedure: DECOMPRESSIVE LUMBAR LAMINECTOMY LEVEL 2;  Surgeon: Georgina Ozell LABOR, MD;  Location: [facility];  Service: Orthopedics;  Laterality: N/A;  Lumbar 2 and Lumbar 3 segmental laminectomies with partial medial facetectomies   DIAGNOSTIC LAPAROSCOPY     tubal preg-took ovary and tube   LAPAROSCOPY FOR ECTOPIC PREGNANCY  1990s   LEFT SALPINGECTOMY AND RIGHT OOPHORECTOMY FOR ABNORMALITY   LUMBAR LAMINECTOMY N/A 09/20/2021   Procedure: L3-4 AND L4-5 CENTRAL LUMBAR LAMINECTOMIES;  Surgeon: Lucilla Lynwood BRAVO, MD;  Location: [facility];  Service: Orthopedics;  Laterality: N/A;   MASS EXCISION   02/15/2012   Procedure: EXCISION MASS;  Surgeon: Donnice LABOR Robinsons, MD;  Location: [facility];  Service: Orthopedics;  Laterality: Right;  Excision of Right Small Volar Mass   MYELOGRAM     TONSILLECTOMY  age 72   TOTAL HIP ARTHROPLASTY Left 04/25/2018   Procedure: TOTAL HIP ARTHROPLASTY ANTERIOR APPROACH;  Surgeon: Fidel Rogue, MD;  Location: [facility];  Service: Orthopedics;  Laterality: Left;   TRIGGER FINGER RELEASE Right 2018   thumb   WISDOM TOOTH EXTRACTION  age 71   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 04/12/2022   Status post lumbar laminectomy 09/20/2021   Body mass index (BMI) 37.0-37.9, adult 03/14/2019   Lumbar stenosis without neurogenic claudication 03/14/2019   Hyperlipidemia 02/08/2019   Lumbar spondylosis 10/25/2018   Degeneration of lumbar intervertebral disc 10/01/2018   Surgical wound dehiscence 06/06/2018   Osteoarthritis of left hip 04/25/2018   Spinal stenosis of lumbar region 02/23/2018   Pain in left knee 01/10/2018   Trigger thumb of right hand 03/30/2017   Symptomatic mammary hypertrophy 11/28/2014   Dyspnea 03/26/2013   Asthma vs VCD  02/28/2013   Hypertensive disorder 06/07/2012   Pyogenic granuloma 02/15/2012    ONSET DATE: 11/20/23  referral date (surgery 12/07/23)   REFERRING DIAG: M25.541 (ICD-10-CM) - Arthralgia of metacarpophalangeal joint, right   Note: Needs OT 2 weeks s/p right long finger MCP arthroplasty  SPLINT  Imaging: XR Hand Complete Right Result Date: 12/21/2023 Stable appearance of the long finger MCP region status post prior resection arthroplasty, appropriate spacing is notable between the metacarpal head and proximal phalanx base  THERAPY DIAG:  Stiffness of right hand, not elsewhere classified  Other lack of coordination  Muscle weakness (generalized)  Localized edema  Other  disturbances of skin sensation  Rationale for Evaluation and Treatment: Rehabilitation  SUBJECTIVE:   SUBJECTIVE STATEMENT: No pain, just stiffness   Pt accompanied by: self  PERTINENT HISTORY: Lt THA 2020, decompression of lumbar spline 08/2023 (L2-3)   PRECAUTIONS: Other: per protocol Rt hand, no lifting over 20 lbs d/t back    WEIGHT BEARING RESTRICTIONS: NWB through Rt hand  PAIN:  Are you having pain? No, just stiffness and achy and tender Rt middle finger around PIP joint  FALLS: Has patient fallen in last 6 months? No  LIVING ENVIRONMENT: Lives with: lives with 7 y.o. grandson Lives in: 1 story home, 1 step to enter Has following equipment at home: Single point cane, Environmental Consultant - 2 wheeled, and bed side commode  PLOF: Independent and on disability d/t hip   PATIENT GOALS: Get use of my Rt hand back  NEXT MD VISIT: 02/01/24  OBJECTIVE:  Note: Objective measures were completed at Evaluation unless otherwise noted.  HAND DOMINANCE: Right  ADLs: Overall ADLs: Mod I for BADLS and IADLS w/ Lt hand and difficulty  FUNCTIONAL OUTCOME MEASURES: 01/09/24: [name]: 52.27% Deficit RUE   UPPER EXTREMITY ROM:   BUE AROM WNLs at shoulders, elbows, and forearms  Active ROM Right eval Left eval  Shoulder flexion    Shoulder abduction    Shoulder adduction    Shoulder extension    Shoulder internal rotation    Shoulder external rotation    Elbow flexion    Elbow extension    Wrist flexion 45   Wrist extension 35   Wrist ulnar deviation    Wrist radial deviation    Wrist pronation    Wrist supination    (Blank rows = not tested)  Active ROM Right eval Left eval  Thumb MCP (0-60)    Thumb IP (0-80)    Thumb Radial abd/add (0-55)     Thumb Palmar abd/add (0-45)     Thumb Opposition to Small Finger     Index MCP (0-90)     Index PIP (0-100)     Index DIP (0-70)      Long MCP (0-90) 54*, -15*     Long PIP (0-100) 75*, 0*     Long DIP (0-70)      Ring  MCP (0-90)      Ring PIP (0-100)      Ring DIP (0-70)      Little MCP (0-90)      Little PIP (0-100)      Little DIP (0-70)      (Blank rows = not tested)  01/17/24: Rt long finger MP joint  = 65*, - 20*  Rt long finger PIP joint = 90*, -10*   UPPER EXTREMITY MMT:   NT   HAND FUNCTION: Grip strength TBD once precautions lifted 02/13/24: Rt = 28.8 lbs, Lt = 54.6 lbs  COORDINATION: Impaired d/t edema and decreased ROM   SENSATION: Not formally assessed  EDEMA: moderate  dorsal hand and in fingers (long finger worse)  COGNITION: Overall cognitive status: Within functional limits for tasks assessed  OBSERVATIONS: Pt came directly from MD office w/ gauze dressing wrapped around hand.    TREATMENT DATE: 02/15/24                                                                                                                            Fluidotherapy x 10 minutes at beginning of session for Rt to address swelling and stiffness. No adverse reactions.   Gripper set at level 2 resistance to pick up blocks Rt hand for sustained grip strength, graded control, and functional use  Pt brought in buddy strap today and reviewed proper placement (just distal to PIP's) for most MP extension of long finger strapped to index finger.   Performed isolated blocking, reverse blocking, isolated MP flex/ext with IP's flexed (with adapted strap provided to hold IP's in flexion).   Pt also encouraged to perform passive extension for Rt long finger PIP joint followed by place and hold as able.   See pt instructions for updates to HEP w/ areas of focus - pt return demo of each   PATIENT EDUCATION: Education details: updates to HEP  Person educated: Patient Education method: Explanation, Demonstration, Verbal cues, and Handouts Education comprehension: verbalized understanding, returned demonstration, and verbal cues required  HOME EXERCISE PROGRAM: 01/04/24: A/ROM HEP for Rt hand/wrist 01/09/24:  P/ROM HEP, scar massage 01/24/24: Coordination Activities 02/05/24: Putty HEP  02/15/24: Updates to HEP   GOALS: Goals reviewed with patient? Yes  SHORT TERM GOALS: Target date: 02/03/24  Independent with splint wear and care Baseline: Goal status: MET  2.  Independent with ROM HEP  Baseline:  Goal status: MET  3.  Pt to verbalize understanding of edema management strategies  Baseline:  Goal status: MET  4.  Pt to improve in Rt long finger MP flexion and PIP flexion by 10* or more Baseline: 54*, 75*  Goal status: MET (01/17/24 = 65*, 90*)   5.  Pt to improve Rt long finger MP extension to -5*  Baseline: -15* 02/13/24 = -10*  Goal status: IN PROGRESS   LONG TERM GOALS: Target date: 03/04/24  Independent with updated strengthening HEP  Baseline:  Goal status: MET   2.  Pt to demo ROM Rt hand WFL's to make fist for full grasp/release Baseline:  Goal status: IN Progress (02/15/24: approx 90% - lacks MP flexion long finger)   3.  Pt to return to using Rt hand as dominant hand for ADLS and bilateral tasks Baseline:  Goal status: MET (except heavy tasks)   4.  Pt to demo 50% or greater Rt grip strength compared to Lt hand Baseline:  Goal status: MET   5.  Quick Dash to be 20% or less deficit RUE Baseline: 52.27%  Goal status: REVISED   ASSESSMENT:  CLINICAL IMPRESSION: Patient seen today for occupational therapy treatment for s/p Rt long finger MCP  arthroplasty. Patient doing well, however lacks full extension Rt long finger. Pt has met 4/5 STG's and 3/5 LTG's at this time. Pt will benefit from skilled OT services in the outpatient setting to decreased splint wear appropriately and help pt return to PLOF as able.    PERFORMANCE DEFICITS: in functional skills including ADLs, IADLs, coordination, dexterity, sensation, edema, ROM, strength, pain, fascial restrictions, Fine motor control, Gross motor control, decreased knowledge of precautions, and UE functional use.    IMPAIRMENTS: are limiting patient from ADLs, IADLs, leisure, and social participation.   COMORBIDITIES: may have co-morbidities  that affects occupational performance. Patient will benefit from skilled OT to address above impairments and improve overall function.  REHAB POTENTIAL: Good  PLAN:  OT FREQUENCY: 1-2x/week (may begin at 1x/wk then increase to 2x/wk)  OT DURATION: 8 weeks  PLANNED INTERVENTIONS: 97535 self care/ADL training, 02889 therapeutic exercise, 97530 therapeutic activity, 97140 manual therapy, 97035 ultrasound, 97018 paraffin, 02960 fluidotherapy, 97010 moist heat, 97010 cryotherapy, 97034 contrast bath, 97760 Orthotic Initial, 97763 Orthotic/Prosthetic subsequent, scar mobilization, passive range of motion, compression bandaging, patient/family education, and DME and/or AE instructions  RECOMMENDED OTHER SERVICES: none at this time  CONSULTED AND AGREED WITH PLAN OF CARE: Patient  PLAN FOR NEXT SESSION:  Fluido or paraffin Review updates to HEP Progress to red putty if able  Burnard JINNY Roads, OT 02/15/2024, 11:10 AM

## 2024-02-19 ENCOUNTER — Ambulatory Visit: Admitting: Occupational Therapy

## 2024-02-19 ENCOUNTER — Encounter: Payer: Self-pay | Admitting: Occupational Therapy

## 2024-02-19 DIAGNOSIS — M79641 Pain in right hand: Secondary | ICD-10-CM

## 2024-02-19 DIAGNOSIS — R208 Other disturbances of skin sensation: Secondary | ICD-10-CM

## 2024-02-19 DIAGNOSIS — M25641 Stiffness of right hand, not elsewhere classified: Secondary | ICD-10-CM | POA: Diagnosis not present

## 2024-02-19 DIAGNOSIS — R278 Other lack of coordination: Secondary | ICD-10-CM

## 2024-02-19 DIAGNOSIS — M6281 Muscle weakness (generalized): Secondary | ICD-10-CM

## 2024-02-19 NOTE — Therapy (Signed)
 " OUTPATIENT OCCUPATIONAL THERAPY ORTHO TREATMENT  Patient Name: Darlene Crosby MRN: [id number] DOB:1965-10-23, 58 y.o., female Today's Date: 02/19/2024  PCP: Jaycee Greig PARAS, NP  REFERRING PROVIDER: Arlinda Buster, MD   END OF SESSION:  OT End of Session - 02/19/24 1109     Visit Number 10    Number of Visits 16    Date for Recertification  03/04/24    Authorization Type [employer] Dual complete - covered 100%    Progress Note Due on Visit 10    OT Start Time 1100    OT Stop Time 1145    OT Time Calculation (min) 45 min    Equipment Utilized During Treatment Fluido, FM objects    Activity Tolerance Patient tolerated treatment well    Behavior During Therapy WFL for tasks assessed/performed          Past Medical History:  Diagnosis Date   Asthma    followed by pcp   Bronchitis    chronic   Hypertension    OA (osteoarthritis)    left knee, right shoulder, left hip   Pneumonia    a couple years ago per pt on 09/15/21   Seasonal allergies    Wears glasses    Past Surgical History:  Procedure Laterality Date   ANTERIOR HIP REVISION Left 06/06/2018   Procedure: LEFT HIP WOUND DEHISCENCE POSSIBLE HEAD AND LINER EXCHANGE;  Surgeon: Fidel Rogue, MD;  Location: [facility];  Service: Orthopedics;  Laterality: Left;   COLONOSCOPY  2015   DECOMPRESSIVE LUMBAR LAMINECTOMY LEVEL 2 N/A 09/22/2023   Procedure: DECOMPRESSIVE LUMBAR LAMINECTOMY LEVEL 2;  Surgeon: Georgina Ozell LABOR, MD;  Location: [facility];  Service: Orthopedics;  Laterality: N/A;  Lumbar 2 and Lumbar 3 segmental laminectomies with partial medial facetectomies   DIAGNOSTIC LAPAROSCOPY     tubal preg-took ovary and tube   LAPAROSCOPY FOR ECTOPIC PREGNANCY  1990s   LEFT SALPINGECTOMY AND RIGHT OOPHORECTOMY FOR ABNORMALITY   LUMBAR LAMINECTOMY N/A 09/20/2021   Procedure: L3-4 AND L4-5 CENTRAL LUMBAR LAMINECTOMIES;  Surgeon: Lucilla Lynwood BRAVO, MD;  Location: [facility];  Service: Orthopedics;  Laterality: N/A;   MASS EXCISION   02/15/2012   Procedure: EXCISION MASS;  Surgeon: Donnice LABOR Robinsons, MD;  Location: [facility];  Service: Orthopedics;  Laterality: Right;  Excision of Right Small Volar Mass   MYELOGRAM     TONSILLECTOMY  age 11   TOTAL HIP ARTHROPLASTY Left 04/25/2018   Procedure: TOTAL HIP ARTHROPLASTY ANTERIOR APPROACH;  Surgeon: Fidel Rogue, MD;  Location: [facility];  Service: Orthopedics;  Laterality: Left;   TRIGGER FINGER RELEASE Right 2018   thumb   WISDOM TOOTH EXTRACTION  age 18   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 04/12/2022   Status post lumbar laminectomy 09/20/2021   Body mass index (BMI) 37.0-37.9, adult 03/14/2019   Lumbar stenosis without neurogenic claudication 03/14/2019   Hyperlipidemia 02/08/2019   Lumbar spondylosis 10/25/2018   Degeneration of lumbar intervertebral disc 10/01/2018   Surgical wound dehiscence 06/06/2018   Osteoarthritis of left hip 04/25/2018   Spinal stenosis of lumbar region 02/23/2018   Pain in left knee 01/10/2018   Trigger thumb of right hand 03/30/2017   Symptomatic mammary hypertrophy 11/28/2014   Dyspnea 03/26/2013   Asthma vs VCD  02/28/2013   Hypertensive disorder 06/07/2012   Pyogenic granuloma 02/15/2012    ONSET DATE:  11/20/23 referral date (surgery 12/07/23)   REFERRING DIAG: M25.541 (ICD-10-CM) - Arthralgia of metacarpophalangeal joint, right   Note: Needs OT 2 weeks s/p right long finger MCP arthroplasty  SPLINT  Imaging: XR Hand Complete Right Result Date: 12/21/2023 Stable appearance of the long finger MCP region status post prior resection arthroplasty, appropriate spacing is notable between the metacarpal head and proximal phalanx base  THERAPY DIAG:  Stiffness of right hand, not elsewhere classified  Pain in right hand  Other lack of coordination  Muscle weakness (generalized)  Other  disturbances of skin sensation  Rationale for Evaluation and Treatment: Rehabilitation  SUBJECTIVE:   SUBJECTIVE STATEMENT: No pain, just stiffness and soreness    Pt accompanied by: self  PERTINENT HISTORY: Lt THA 2020, decompression of lumbar spline 08/2023 (L2-3)   PRECAUTIONS: Other: per protocol Rt hand, no lifting over 20 lbs d/t back    WEIGHT BEARING RESTRICTIONS: NWB through Rt hand  PAIN:  Are you having pain? No, just stiffness and achy and tender Rt middle finger around PIP joint  FALLS: Has patient fallen in last 6 months? No  LIVING ENVIRONMENT: Lives with: lives with 3 y.o. grandson Lives in: 1 story home, 1 step to enter Has following equipment at home: Single point cane, Environmental Consultant - 2 wheeled, and bed side commode  PLOF: Independent and on disability d/t hip   PATIENT GOALS: Get use of my Rt hand back  NEXT MD VISIT: 02/01/24  OBJECTIVE:  Note: Objective measures were completed at Evaluation unless otherwise noted.  HAND DOMINANCE: Right  ADLs: Overall ADLs: Mod I for BADLS and IADLS w/ Lt hand and difficulty  FUNCTIONAL OUTCOME MEASURES: 01/09/24: [name]: 52.27% Deficit RUE   UPPER EXTREMITY ROM:   BUE AROM WNLs at shoulders, elbows, and forearms  Active ROM Right eval Left eval  Shoulder flexion    Shoulder abduction    Shoulder adduction    Shoulder extension    Shoulder internal rotation    Shoulder external rotation    Elbow flexion    Elbow extension    Wrist flexion 45   Wrist extension 35   Wrist ulnar deviation    Wrist radial deviation    Wrist pronation    Wrist supination    (Blank rows = not tested)  Active ROM Right eval Left eval  Thumb MCP (0-60)    Thumb IP (0-80)    Thumb Radial abd/add (0-55)     Thumb Palmar abd/add (0-45)     Thumb Opposition to Small Finger     Index MCP (0-90)     Index PIP (0-100)     Index DIP (0-70)      Long MCP (0-90) 54*, -15*     Long PIP (0-100) 75*, 0*     Long DIP (0-70)       Ring MCP (0-90)      Ring PIP (0-100)      Ring DIP (0-70)      Little MCP (0-90)      Little PIP (0-100)      Little DIP (0-70)      (Blank rows = not tested)  01/17/24: Rt long finger MP joint  = 65*, - 20*  Rt long finger PIP joint = 90*, -10*   UPPER EXTREMITY MMT:   NT   HAND FUNCTION: Grip strength TBD once precautions lifted 02/13/24: Rt = 28.8 lbs, Lt = 54.6 lbs  COORDINATION: Impaired d/t edema and decreased ROM   SENSATION:  Not formally assessed  EDEMA: moderate dorsal hand and in fingers (long finger worse)  COGNITION: Overall cognitive status: Within functional limits for tasks assessed  OBSERVATIONS: Pt came directly from MD office w/ gauze dressing wrapped around hand.    TREATMENT DATE: 02/19/24                                                                                                                            Paraffin x 10 minutes at beginning of session for Rt to address swelling and stiffness. No adverse reactions.   Reviewed updates to most recent HEP  - pt return demo of each 5-10 reps  Pt also issued red resistance putty for grip strength and told to either continue to use yellow resistance for pinch strength or roll red putty out thinner for pinch strength - pt could do latter w/ no problems or pain  Pt manipulating coins for in hand manipulation (up to 5 pennies in palm) then stacking 1 at a time for 3 stacks of 5  Gripper set at level 2 resistance to pick up blocks Rt hand for sustained grip strength, graded control, and functional use with no difficulty but some soreness/fatigue.     PATIENT EDUCATION: Education details: updates to HEP  Person educated: Patient Education method: Programmer, Multimedia, Facilities Manager, Verbal cues, and Handouts Education comprehension: verbalized understanding, returned demonstration, and verbal cues required  HOME EXERCISE PROGRAM: 01/04/24: A/ROM HEP for Rt hand/wrist 01/09/24: P/ROM HEP, scar  massage 01/24/24: Coordination Activities 02/05/24: Putty HEP  02/15/24: Updates to HEP   GOALS: Goals reviewed with patient? Yes  SHORT TERM GOALS: Target date: 02/03/24  Independent with splint wear and care Baseline: Goal status: MET  2.  Independent with ROM HEP  Baseline:  Goal status: MET  3.  Pt to verbalize understanding of edema management strategies  Baseline:  Goal status: MET  4.  Pt to improve in Rt long finger MP flexion and PIP flexion by 10* or more Baseline: 54*, 75*  Goal status: MET (01/17/24 = 65*, 90*)   5.  Pt to improve Rt long finger MP extension to -5*  Baseline: -15* 02/13/24 = -10*  Goal status: IN PROGRESS   LONG TERM GOALS: Target date: 03/04/24  Independent with updated strengthening HEP  Baseline:  Goal status: MET   2.  Pt to demo ROM Rt hand WFL's to make fist for full grasp/release Baseline:  Goal status: IN Progress (02/15/24: approx 90% - lacks MP flexion long finger)   3.  Pt to return to using Rt hand as dominant hand for ADLS and bilateral tasks Baseline:  Goal status: MET (except heavy tasks)   4.  Pt to demo 50% or greater Rt grip strength compared to Lt hand Baseline:  Goal status: MET   5.  Quick Dash to be 20% or less deficit RUE Baseline: 52.27%  Goal status: REVISED   ASSESSMENT:  CLINICAL IMPRESSION: Patient seen today for occupational therapy  treatment for s/p Rt long finger MCP arthroplasty. Patient doing well, however lacks full extension Rt long finger. Pt has met 4/5 STG's and 3/5 LTG's at this time. Pt will benefit from skilled OT services in the outpatient setting to decreased splint wear appropriately and help pt return to PLOF as able.    PERFORMANCE DEFICITS: in functional skills including ADLs, IADLs, coordination, dexterity, sensation, edema, ROM, strength, pain, fascial restrictions, Fine motor control, Gross motor control, decreased knowledge of precautions, and UE functional use.   IMPAIRMENTS: are  limiting patient from ADLs, IADLs, leisure, and social participation.   COMORBIDITIES: may have co-morbidities  that affects occupational performance. Patient will benefit from skilled OT to address above impairments and improve overall function.  REHAB POTENTIAL: Good  PLAN:  OT FREQUENCY: 1-2x/week (may begin at 1x/wk then increase to 2x/wk)  OT DURATION: 8 weeks  PLANNED INTERVENTIONS: 97535 self care/ADL training, 02889 therapeutic exercise, 97530 therapeutic activity, 97140 manual therapy, 97035 ultrasound, 97018 paraffin, 02960 fluidotherapy, 97010 moist heat, 97010 cryotherapy, 97034 contrast bath, 97760 Orthotic Initial, 97763 Orthotic/Prosthetic subsequent, scar mobilization, passive range of motion, compression bandaging, patient/family education, and DME and/or AE instructions  RECOMMENDED OTHER SERVICES: none at this time  CONSULTED AND AGREED WITH PLAN OF CARE: Patient  PLAN FOR NEXT SESSION:  Fluido  Try gripper at level 3 resistance  Burnard JINNY Roads, OT 02/19/2024, 11:10 AM   "

## 2024-02-21 ENCOUNTER — Ambulatory Visit: Admitting: Occupational Therapy

## 2024-02-27 ENCOUNTER — Ambulatory Visit: Admitting: Occupational Therapy

## 2024-03-04 ENCOUNTER — Encounter: Admitting: Orthopedic Surgery

## 2024-03-04 ENCOUNTER — Ambulatory Visit: Attending: Family | Admitting: Occupational Therapy

## 2024-03-04 DIAGNOSIS — M25541 Pain in joints of right hand: Secondary | ICD-10-CM

## 2024-03-04 DIAGNOSIS — M79641 Pain in right hand: Secondary | ICD-10-CM | POA: Insufficient documentation

## 2024-03-04 DIAGNOSIS — M6281 Muscle weakness (generalized): Secondary | ICD-10-CM | POA: Diagnosis present

## 2024-03-04 DIAGNOSIS — M25641 Stiffness of right hand, not elsewhere classified: Secondary | ICD-10-CM | POA: Diagnosis present

## 2024-03-04 DIAGNOSIS — R208 Other disturbances of skin sensation: Secondary | ICD-10-CM | POA: Insufficient documentation

## 2024-03-04 DIAGNOSIS — R278 Other lack of coordination: Secondary | ICD-10-CM | POA: Insufficient documentation

## 2024-03-04 DIAGNOSIS — R600 Localized edema: Secondary | ICD-10-CM | POA: Insufficient documentation

## 2024-03-04 NOTE — Therapy (Signed)
 " OUTPATIENT OCCUPATIONAL THERAPY ORTHO TREATMENT & PROGRESS NOTE  Patient Name: Darlene Crosby MRN: [id number] DOB:06-Jul-1965, 59 y.o., female Today's Date: 03/04/2024  PCP: Jaycee Greig PARAS, NP  REFERRING PROVIDER: Arlinda Buster, MD   END OF SESSION:  OT End of Session - 03/04/24 1105     Visit Number 11    Number of Visits 16    Date for Recertification  04/05/24    Authorization Type [employer] Dual complete - covered 100%    Progress Note Due on Visit 10    OT Start Time 1105    OT Stop Time 1200    OT Time Calculation (min) 55 min    Equipment Utilized During Treatment Testing Material; splint    Activity Tolerance Patient tolerated treatment well    Behavior During Therapy WFL for tasks assessed/performed          Past Medical History:  Diagnosis Date   Asthma    followed by pcp   Bronchitis    chronic   Hypertension    OA (osteoarthritis)    left knee, right shoulder, left hip   Pneumonia    a couple years ago per pt on 09/15/21   Seasonal allergies    Wears glasses    Past Surgical History:  Procedure Laterality Date   ANTERIOR HIP REVISION Left 06/06/2018   Procedure: LEFT HIP WOUND DEHISCENCE POSSIBLE HEAD AND LINER EXCHANGE;  Surgeon: Fidel Rogue, MD;  Location: [facility];  Service: Orthopedics;  Laterality: Left;   COLONOSCOPY  2015   DECOMPRESSIVE LUMBAR LAMINECTOMY LEVEL 2 N/A 09/22/2023   Procedure: DECOMPRESSIVE LUMBAR LAMINECTOMY LEVEL 2;  Surgeon: Georgina Ozell LABOR, MD;  Location: [facility];  Service: Orthopedics;  Laterality: N/A;  Lumbar 2 and Lumbar 3 segmental laminectomies with partial medial facetectomies   DIAGNOSTIC LAPAROSCOPY     tubal preg-took ovary and tube   LAPAROSCOPY FOR ECTOPIC PREGNANCY  1990s   LEFT SALPINGECTOMY AND RIGHT OOPHORECTOMY FOR ABNORMALITY   LUMBAR LAMINECTOMY N/A 09/20/2021   Procedure: L3-4 AND L4-5 CENTRAL LUMBAR LAMINECTOMIES;  Surgeon: Lucilla Lynwood BRAVO, MD;  Location: [facility];  Service: Orthopedics;  Laterality: N/A;    MASS EXCISION  02/15/2012   Procedure: EXCISION MASS;  Surgeon: Donnice LABOR Robinsons, MD;  Location: [facility];  Service: Orthopedics;  Laterality: Right;  Excision of Right Small Volar Mass   MYELOGRAM     TONSILLECTOMY  age 50   TOTAL HIP ARTHROPLASTY Left 04/25/2018   Procedure: TOTAL HIP ARTHROPLASTY ANTERIOR APPROACH;  Surgeon: Fidel Rogue, MD;  Location: [facility];  Service: Orthopedics;  Laterality: Left;   TRIGGER FINGER RELEASE Right 2018   thumb   WISDOM TOOTH EXTRACTION  age 21   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 04/12/2022   Status post lumbar laminectomy 09/20/2021   Body mass index (BMI) 37.0-37.9, adult 03/14/2019   Lumbar stenosis without neurogenic claudication 03/14/2019   Hyperlipidemia 02/08/2019   Lumbar spondylosis 10/25/2018   Degeneration of lumbar intervertebral disc 10/01/2018   Surgical wound dehiscence 06/06/2018   Osteoarthritis of left hip 04/25/2018   Spinal stenosis of lumbar region 02/23/2018   Pain in left knee 01/10/2018   Trigger thumb of right hand 03/30/2017   Symptomatic mammary hypertrophy 11/28/2014   Dyspnea 03/26/2013   Asthma vs VCD  02/28/2013   Hypertensive disorder 06/07/2012   Pyogenic granuloma 02/15/2012  ONSET DATE: 11/20/23 referral date (surgery 12/07/23)   REFERRING DIAG: M25.541 (ICD-10-CM) - Arthralgia of metacarpophalangeal joint, right  Note: Needs OT 2 weeks s/p right long finger MCP arthroplasty  SPLINT  Imaging: XR Hand Complete Right Result Date: 12/21/2023 Stable appearance of the long finger MCP region status post prior resection arthroplasty, appropriate spacing is notable between the metacarpal head and proximal phalanx base  THERAPY DIAG:  Stiffness of right hand, not elsewhere classified  Pain in right hand  Other lack of coordination  Muscle weakness  (generalized)  Other disturbances of skin sensation  Localized edema  Rationale for Evaluation and Treatment: Rehabilitation  SUBJECTIVE:   SUBJECTIVE STATEMENT: Pt reports she saw the physician prior to OT today and does not have to go back for 3 months.  Pt reports still no pain, just stiffness and aching around her PIP joint (above surgical site).    Pt accompanied by: self  PERTINENT HISTORY: Lt THA 2020, decompression of lumbar spline 08/2023 (L2-3)   PRECAUTIONS: Other: per protocol Rt hand, no lifting over 20 lbs d/t back    WEIGHT BEARING RESTRICTIONS: No  PAIN:  Are you having pain? No, just stiffness and achy and tender Rt middle finger around PIP joint  FALLS: Has patient fallen in last 6 months? No  LIVING ENVIRONMENT: Lives with: lives with 86 y.o. grandson Lives in: 1 story home, 1 step to enter Has following equipment at home: Single point cane, Environmental Consultant - 2 wheeled, and bed side commode  PLOF: Independent and on disability d/t hip   PATIENT GOALS: Get use of my Rt hand back  NEXT MD VISIT: 06/03/24  OBJECTIVE:  Note: Objective measures were completed at Evaluation unless otherwise noted.  HAND DOMINANCE: Right  ADLs: Overall ADLs: Mod I for BADLS and IADLS w/ Lt hand and difficulty  FUNCTIONAL OUTCOME MEASURES: 01/09/24: [name]: 52.27% Deficit RUE   UPPER EXTREMITY ROM:   BUE AROM WNLs at shoulders, elbows, and forearms  Active ROM Right eval Left eval  Shoulder flexion    Shoulder abduction    Shoulder adduction    Shoulder extension    Shoulder internal rotation    Shoulder external rotation    Elbow flexion    Elbow extension    Wrist flexion 45   Wrist extension 35   Wrist ulnar deviation    Wrist radial deviation    Wrist pronation    Wrist supination    (Blank rows = not tested)  Active ROM Right eval Left eval  Thumb MCP (0-60)    Thumb IP (0-80)    Thumb Radial abd/add (0-55)     Thumb Palmar abd/add (0-45)      Thumb Opposition to Small Finger     Index MCP (0-90)     Index PIP (0-100)     Index DIP (0-70)      Long MCP (0-90) 54*, -15*     Long PIP (0-100) 75*, 0*     Long DIP (0-70)      Ring MCP (0-90)      Ring PIP (0-100)      Ring DIP (0-70)      Little MCP (0-90)      Little PIP (0-100)      Little DIP (0-70)      (Blank rows = not tested)  01/17/24: Rt long finger MP joint  = 65*, - 20*  Rt long finger PIP joint = 90*, -10*   UPPER EXTREMITY MMT:  NT   HAND FUNCTION: Grip strength TBD once precautions lifted 02/13/24: Rt = 28.8 lbs, Lt = 54.6 lbs  03/04/24 Rt 36.3, 48.0, 49.1, 50.9; Lt 54.8, 54.6, 66.1, 68.1 Average: Right: 46.1 lbs Left: 60.9 lbs  COORDINATION: Impaired d/t edema and decreased ROM   03/04/24 9 hole peg test Right: 21.82 Left: 20.50 sec;   SENSATION: Not formally assessed  03/04/24 - WFL   EDEMA: moderate dorsal hand and in fingers (long finger worse)  COGNITION: Overall cognitive status: Within functional limits for tasks assessed  OBSERVATIONS: Pt came directly from MD office w/ gauze dressing wrapped around hand.    TREATMENT DATE: 03/04/24                                                                                                                            - Therapeutic activities completed for duration as noted below including: Therapist updated patient progression.  Grip Strength improved as follows 02/13/24: Rt = 28.8 lbs, Lt = 54.6 lbs 03/04/24 Rt 36.3, 48.0, 49.1, 50.9; Lt 54.8, 54.6, 66.1, 68.1 Average: Right: 46.1 lbs Left: 60.9 lbs Coordination without relying on use of middle finger is also good  03/04/24 9 hole peg test Right: 21.82 Left: 20.50 sec;   - Self Care education and training completed for duration as noted below including: OT educated pt on joint protection principles as noted in pt instructions as needed to improve UE pain due to arthritis in various joints including hands/fingers and shoulder by pt reports.    Handouts provided and verbal education regarding joint protection and patient is encouraged to consider the specific acronym LESS ie) less strain on joints. L: Listen to your body E: Energy Conservation S: Stronger Joints take the Lead S: Strategize   Patient encouraged to protect hands and wrist by  - Respecting for Pain and stopping activities before they reach the point of discomfort or pain  - Rest and Work Balance ie) balancing activities with appropriate rests during activity - Reduction of Effort - Use two hands instead of one if possible  - Use of Larger/Stronger Joints Ie) Lift or carry with the forearm or shoulder rather than fingers - Avoid Activities That Cannot Be Stopped - Use of Assistive Equipment - Consider splint use and AE equipment to protect joints from deformity and stresses Then reviewed activities that can be modified with adaptive equipment and encourage patient to look adaptive equipment for arthritis [i.e. to consider joint protection].   - Orthotic modifications completed for duration as noted below including:  Pt instructed by MD to use splint for protection as needed in public.  Pt also preferring to wear splint while driving.  OTR removed back of clam shell splint and added additional velcro hook and straps to allow pt to apply splint more like a wrist splint with ongoing protection of her MCPs.  PATIENT EDUCATION: Education details: Progress to date Person educated: Patient Education method: Explanation, Facilities Manager, and Verbal cues Education  comprehension: verbalized understanding, returned demonstration, and verbal cues required  HOME EXERCISE PROGRAM: 01/04/24: A/ROM HEP for Rt hand/wrist 01/09/24: P/ROM HEP, scar massage 01/24/24: Coordination Activities 02/05/24: Putty HEP  02/15/24: Updates to HEP   GOALS: Goals reviewed with patient? Yes  SHORT TERM GOALS: Target date: 02/03/24  Independent with splint wear and care Baseline: Goal  status: MET  2.  Independent with ROM HEP  Baseline:  Goal status: MET  3.  Pt to verbalize understanding of edema management strategies  Baseline:  Goal status: MET  4.  Pt to improve in Rt long finger MP flexion and PIP flexion by 10* or more Baseline: 54*, 75*  Goal status: MET (01/17/24 = 65*, 90*)   5.  Pt to improve Rt long finger MP extension to -5*  Baseline: -15* 02/13/24 = -10*  Goal status: IN PROGRESS   LONG TERM GOALS: Target date: 03/04/24  Independent with updated strengthening HEP  Baseline:  Goal status: MET   2.  Pt to demo ROM Rt hand WFL's to make fist for full grasp/release Baseline:  Goal status: IN Progress (02/15/24: approx 90% - lacks MP flexion long finger)   3.  Pt to return to using Rt hand as dominant hand for ADLS and bilateral tasks Baseline:  Goal status: MET (except heavy tasks)   4.  Pt to demo 50% or greater Rt grip strength compared to Lt hand Baseline:  Goal status: MET   5.  Quick Dash to be 20% or less deficit RUE Baseline: 52.27%  Goal status: REVISED   ASSESSMENT:  CLINICAL IMPRESSION: Patient seen today for occupational therapy treatment for s/p Rt long finger MCP arthroplasty. Patient doing well, however lacks full extension and flexion of Rt long finger. Pt has met 4/5 STG's and 3/5 LTG's at this time. Pt will benefit from skilled OT services in the outpatient setting to decreased splint wear appropriately and help pt return to PLOF as able.    PERFORMANCE DEFICITS: in functional skills including ADLs, IADLs, coordination, dexterity, sensation, edema, ROM, strength, pain, fascial restrictions, Fine motor control, Gross motor control, decreased knowledge of precautions, and UE functional use.   IMPAIRMENTS: are limiting patient from ADLs, IADLs, leisure, and social participation.   COMORBIDITIES: may have co-morbidities  that affects occupational performance. Patient will benefit from skilled OT to address above impairments  and improve overall function.  REHAB POTENTIAL: Good  PLAN:  OT FREQUENCY: 1x/week   OT DURATION: additional 4 weeks  PLANNED INTERVENTIONS: 97535 self care/ADL training, 02889 therapeutic exercise, 97530 therapeutic activity, 97140 manual therapy, 97035 ultrasound, 97018 paraffin, 02960 fluidotherapy, 97010 moist heat, 97010 cryotherapy, 97034 contrast bath, 97760 Orthotic Initial, 97763 Orthotic/Prosthetic subsequent, scar mobilization, passive range of motion, compression bandaging, patient/family education, and DME and/or AE instructions  RECOMMENDED OTHER SERVICES: none at this time  CONSULTED AND AGREED WITH PLAN OF CARE: Patient  PLAN FOR NEXT SESSION:  Fluido  Try gripper at level 3 resistance (occupied at visit 03/04/24).  Redo QuickDash  Clarita LITTIE Pride, OT 03/04/2024, 11:06 AM   "

## 2024-03-04 NOTE — Progress Notes (Signed)
" ° °  Darlene Crosby - 59 y.o. female MRN [id number]  Date of birth: 10-10-1965  Office Visit Note: Visit Date: 03/04/2024 PCP: Darlene Greig PARAS, NP Referred by: Darlene Greig PARAS, NP  Subjective:  HPI: Darlene Crosby is a 59 y.o. female who presents today for follow up 3 months status post right long finger metacarpophalangeal arthroplasty .  Doing well overall, has resumed activities as tolerated without significant restriction.  Is pleased with her outcome and progress.  Pertinent ROS were reviewed with the patient and found to be negative unless otherwise specified above in HPI.   Assessment & Plan: Visit Diagnoses:  1. Arthralgia of metacarpophalangeal joint, right     Plan: She is doing quite well postoperatively.  I am pleased to see that she has resumed her activities as tolerated without significant restriction.  Her range of motion continues to improve nicely, her grip and composite fist is excellent on examination today.  She has good stability at the long finger MCP region.  Grip strength continues to improve as well.  At this juncture, she can return to me in approximate 3 months or on an as-needed basis.  Transition to home exercise program when deemed appropriate by OT, okay from my standpoint.  Follow-up: No follow-ups on file.   Meds & Orders: No orders of the defined types were placed in this encounter.  No orders of the defined types were placed in this encounter.    Procedures: No procedures performed       Objective:   Vital Signs: LMP 05/04/2012   Ortho Exam Right hand well-healed dorsal incision, no erythema or drainage, digital range of motion is preserved, able to achieve composite fist, no gross instability at the MCP region, grip strength Jamar 2 right 20, left 75    Imaging: No results found.   Darlene Crosby, M.D. [facility], Hand Surgery  "

## 2024-03-04 NOTE — Patient Instructions (Signed)
 SABRA

## 2024-03-12 ENCOUNTER — Ambulatory Visit (INDEPENDENT_AMBULATORY_CARE_PROVIDER_SITE_OTHER): Admitting: Family

## 2024-03-12 ENCOUNTER — Ambulatory Visit: Admitting: Occupational Therapy

## 2024-03-12 ENCOUNTER — Encounter: Payer: Self-pay | Admitting: Family

## 2024-03-12 VITALS — BP 147/89 | HR 82 | Ht 65.0 in | Wt 229.0 lb

## 2024-03-12 DIAGNOSIS — Z13228 Encounter for screening for other metabolic disorders: Secondary | ICD-10-CM

## 2024-03-12 DIAGNOSIS — E785 Hyperlipidemia, unspecified: Secondary | ICD-10-CM

## 2024-03-12 DIAGNOSIS — I10 Essential (primary) hypertension: Secondary | ICD-10-CM | POA: Diagnosis not present

## 2024-03-12 DIAGNOSIS — R635 Abnormal weight gain: Secondary | ICD-10-CM

## 2024-03-12 DIAGNOSIS — R7303 Prediabetes: Secondary | ICD-10-CM

## 2024-03-12 MED ORDER — SIMVASTATIN 5 MG PO TABS: 5.0000 mg | ORAL_TABLET | Freq: Every day | ORAL | 0 refills | Status: AC

## 2024-03-12 MED ORDER — AMLODIPINE BESYLATE 5 MG PO TABS: 5.0000 mg | ORAL_TABLET | Freq: Every day | ORAL | 0 refills | Status: AC

## 2024-03-12 NOTE — Progress Notes (Signed)
 "   Patient ID: Darlene Crosby, female    DOB: 26-Mar-1965  MRN: [id number]  CC: Chronic Conditions Follow-Up  Subjective: Darlene Crosby is a 59 y.o. female who presents for chronic conditions follow-up.   Her concerns today include:  - Doing well on Amlodipine , no issues/concerns. Home blood pressures 130's/80's. States she just consumed some vinegar and thinks that has her blood pressure elevated today. She is watching what she eats. She does not complain of red flag symptoms such as but not limited to chest pain, shortness of breath, worst headache of life, nausea/vomiting.  - Doing well on Simvastatin , no issues/concerns.  - Prediabetes lab.  - Magnesium lab.  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 04/12/2022   Status post lumbar laminectomy 09/20/2021   Body mass index (BMI) 37.0-37.9, adult 03/14/2019   Lumbar stenosis without neurogenic claudication 03/14/2019   Hyperlipidemia 02/08/2019   Lumbar spondylosis 10/25/2018   Degeneration of lumbar intervertebral disc 10/01/2018   Surgical wound dehiscence 06/06/2018   Osteoarthritis of left hip 04/25/2018   Spinal stenosis of lumbar region 02/23/2018   Pain in left knee 01/10/2018   Trigger thumb of right hand 03/30/2017   Symptomatic mammary hypertrophy 11/28/2014   Dyspnea 03/26/2013   Asthma vs VCD  02/28/2013   Hypertensive disorder 06/07/2012   Pyogenic granuloma 02/15/2012     Medications Ordered Prior to Encounter[1]  Allergies[2]  Social History   Socioeconomic History   Marital status: Widowed    Spouse name: Not on file   Number of children: 2   Years of education: Not on file   Highest education level: Associate degree: occupational, scientist, product/process development, or vocational program  Occupational History   Not on file  Tobacco Use   Smoking status: Never    Passive exposure: Never   Smokeless  tobacco: Never  Vaping Use   Vaping status: Never Used  Substance and Sexual Activity   Alcohol  use: Yes    Alcohol /week: 1.0 standard drink of alcohol     Types: 1 Glasses of wine per week   Drug use: Never   Sexual activity: Not on file  Other Topics Concern   Not on file  Social History Narrative   Not on file   Social Drivers of Health   Tobacco Use: Low Risk (03/12/2024)   Patient History    Smoking Tobacco Use: Never    Smokeless Tobacco Use: Never    Passive Exposure: Never  Financial Resource Strain: Low Risk (10/13/2023)   Overall Financial Resource Strain (CARDIA)    Difficulty of Paying Living Expenses: Not hard at all  Food Insecurity: Food Insecurity Present (10/13/2023)   Epic    Worried About Programme Researcher, Broadcasting/film/video in the Last Year: Sometimes true    [employer] of Food in the Last Year: Often true  Transportation Needs: No Transportation Needs (10/13/2023)   Epic    Lack of Transportation (Medical): No    Lack of Transportation (Non-Medical): No  Physical Activity: Insufficiently Active (10/13/2023)   Exercise Vital Sign    Days of Exercise per Week: 3 days    Minutes of Exercise per Session: 30 min  Stress: No Stress Concern Present (10/13/2023)   [employer] of Occupational Health - Occupational Stress Questionnaire    Feeling of Stress: Not at all  Social Connections: Moderately Isolated (10/13/2023)   Social Connection and Isolation Panel  Frequency of Communication with Friends and Family: More than three times a week    Frequency of Social Gatherings with Friends and Family: Once a week    Attends Religious Services: More than 4 times per year    Active Member of [employer] or Organizations: No    Attends Banker Meetings: Not on file    Marital Status: Widowed  Intimate Partner Violence: Not At Risk (04/13/2023)   Humiliation, Afraid, Rape, and Kick questionnaire    Fear of Current or Ex-Partner: No    Emotionally Abused: No    Physically  Abused: No    Sexually Abused: No  Depression (PHQ2-9): Low Risk (02/05/2024)   Depression (PHQ2-9)    PHQ-2 Score: 0  Alcohol  Screen: Low Risk (10/13/2023)   Alcohol  Screen    Last Alcohol  Screening Score (AUDIT): 1  Housing: Low Risk (10/13/2023)   Epic    Unable to Pay for Housing in the Last Year: No    Number of Times Moved in the Last Year: 0    Homeless in the Last Year: No  Utilities: Not At Risk (04/13/2023)   A[employer] Utilities    Threatened with loss of utilities: No  Health Literacy: Adequate Health Literacy (04/13/2023)   B1300 Health Literacy    Frequency of need for help with medical instructions: Never    Family History  Problem Relation Age of Onset   Colon polyps Mother 24   Hypertension Mother    Asthma Mother    Allergies Mother    Breast cancer Sister    Heart disease Sister    Hypertension Sister    Stroke Sister    Diabetes Sister    Colon cancer Neg Hx    Esophageal cancer Neg Hx    Stomach cancer Neg Hx    Pancreatic cancer Neg Hx    Rectal cancer Neg Hx     Past Surgical History:  Procedure Laterality Date   ANTERIOR HIP REVISION Left 06/06/2018   Procedure: LEFT HIP WOUND DEHISCENCE POSSIBLE HEAD AND LINER EXCHANGE;  Surgeon: Fidel Rogue, MD;  Location: [facility];  Service: Orthopedics;  Laterality: Left;   COLONOSCOPY  2015   DECOMPRESSIVE LUMBAR LAMINECTOMY LEVEL 2 N/A 09/22/2023   Procedure: DECOMPRESSIVE LUMBAR LAMINECTOMY LEVEL 2;  Surgeon: Georgina Ozell LABOR, MD;  Location: [facility];  Service: Orthopedics;  Laterality: N/A;  Lumbar 2 and Lumbar 3 segmental laminectomies with partial medial facetectomies   DIAGNOSTIC LAPAROSCOPY     tubal preg-took ovary and tube   LAPAROSCOPY FOR ECTOPIC PREGNANCY  1990s   LEFT SALPINGECTOMY AND RIGHT OOPHORECTOMY FOR ABNORMALITY   LUMBAR LAMINECTOMY N/A 09/20/2021   Procedure: L3-4 AND L4-5 CENTRAL LUMBAR LAMINECTOMIES;  Surgeon: Lucilla Lynwood BRAVO, MD;  Location: [facility];  Service: Orthopedics;  Laterality: N/A;   MASS  EXCISION  02/15/2012   Procedure: EXCISION MASS;  Surgeon: Donnice LABOR Robinsons, MD;  Location: [facility];  Service: Orthopedics;  Laterality: Right;  Excision of Right Small Volar Mass   MYELOGRAM     TONSILLECTOMY  age 6   TOTAL HIP ARTHROPLASTY Left 04/25/2018   Procedure: TOTAL HIP ARTHROPLASTY ANTERIOR APPROACH;  Surgeon: Fidel Rogue, MD;  Location: [facility];  Service: Orthopedics;  Laterality: Left;   TRIGGER FINGER RELEASE Right 2018   thumb   WISDOM TOOTH EXTRACTION  age 86    ROS: Review of Systems Negative except as stated above  PHYSICAL EXAM: BP (!) 147/89   Pulse 82   Ht  5' 5 (1.651 m)   Wt 229 lb (103.9 kg)   LMP 05/04/2012   SpO2 98%   BMI 38.11 kg/m   Physical Exam HENT:     Head: Normocephalic and atraumatic.     Nose: Nose normal.     Mouth/Throat:     Mouth: Mucous membranes are moist.     Pharynx: Oropharynx is clear.  Eyes:     Extraocular Movements: Extraocular movements intact.     Conjunctiva/sclera: Conjunctivae normal.     Pupils: Pupils are equal, round, and reactive to light.  Cardiovascular:     Rate and Rhythm: Normal rate and regular rhythm.     Pulses: Normal pulses.     Heart sounds: Normal heart sounds.  Pulmonary:     Effort: Pulmonary effort is normal.     Breath sounds: Normal breath sounds.  Musculoskeletal:        General: Normal range of motion.     Cervical back: Normal range of motion and neck supple.  Neurological:     General: No focal deficit present.     Mental Status: She is alert and oriented to person, place, and time.  Psychiatric:        Mood and Affect: Mood normal.        Behavior: Behavior normal.     ASSESSMENT AND PLAN: 1. Primary hypertension (Primary) - Blood pressure not at goal during today's visit. Patient asymptomatic without chest pressure, chest pain, palpitations, shortness of breath, worst headache of life, and any additional red flag symptoms. - Patient declined  pharmacological adjustments.  - Continue Amlodipine  as prescribed.  - Routine screening.  - Counseled on blood pressure goal of less than 130/80, low-sodium, DASH diet, medication compliance, and 150 minutes of moderate intensity exercise per week as tolerated. Counseled on medication adherence and adverse effects. - Follow-up with primary provider in 4 weeks or sooner if needed. - amLODipine  (NORVASC ) 5 MG tablet; Take 1 tablet (5 mg total) by mouth daily.  Dispense: 90 tablet; Refill: 0 - Basic Metabolic Panel  2. Hyperlipidemia, unspecified hyperlipidemia type - Continue Simvastatin  as prescribed. Counseled on medication adherence/adverse effects.  - Follow-up with primary provider in 3 months or sooner if needed. - simvastatin  (ZOCOR ) 5 MG tablet; Take 1 tablet (5 mg total) by mouth at bedtime.  Dispense: 90 tablet; Refill: 0  3. Prediabetes - Routine screening.  - Hemoglobin A1c  4. Screening for metabolic disorder - Routine screening.  - Magnesium  5. Weight gain - Continue to hold Phentermine  due to elevated blood pressure (see #1).   Patient was given the opportunity to ask questions.  Patient verbalized understanding of the plan and was able to repeat key elements of the plan. Patient was given clear instructions to go to Emergency Department or return to medical center if symptoms don't improve, worsen, or new problems develop.The patient verbalized understanding.   Orders Placed This Encounter  Procedures   Hemoglobin A1c   Basic Metabolic Panel   Magnesium     Requested Prescriptions   Signed Prescriptions Disp Refills   amLODipine  (NORVASC ) 5 MG tablet 90 tablet 0    Sig: Take 1 tablet (5 mg total) by mouth daily.   simvastatin  (ZOCOR ) 5 MG tablet 90 tablet 0    Sig: Take 1 tablet (5 mg total) by mouth at bedtime.    Return in about 4 weeks (around 04/09/2024) for Follow-Up or next available chronic conditions.  Tyona Nilsen JINNY Chute, NP      [  1]  Current  Outpatient Medications on File Prior to Visit  Medication Sig Dispense Refill   albuterol  (PROVENTIL ) (2.5 MG/3ML) 0.083% nebulizer solution Take 3 mLs (2.5 mg total) by nebulization every 6 (six) hours as needed for wheezing or shortness of breath. 150 mL 2   albuterol  (VENTOLIN  HFA) 108 (90 Base) MCG/ACT inhaler Inhale 2 puffs into the lungs every 4 (four) hours as needed (wheezing and SOB). 8 g 2   atorvastatin  (LIPITOR) 20 MG tablet Take 1 tablet (20 mg total) by mouth daily. 90 tablet 0   Calcium -Magnesium-Zinc (CAL-MAG-ZINC PO) Take 1 tablet by mouth 2 (two) times daily. (Patient taking differently: Take 1 tablet by mouth daily.)     Cyanocobalamin  (B-12 PO) Take 1 capsule by mouth daily.     diclofenac  (VOLTAREN ) 50 MG EC tablet Take 1 tablet (50 mg total) by mouth 3 (three) times daily. 300 tablet 2   ELDERBERRY PO Take 1 tablet by mouth daily.     fluticasone  (FLONASE ) 50 MCG/ACT nasal spray Place 1 spray into both nostrils every other day.     levocetirizine (XYZAL ) 5 MG tablet TAKE 1 TABLET BY MOUTH ONCE DAILY IN THE EVENING 90 tablet 0   methocarbamol  (ROBAXIN ) 500 MG tablet TAKE 1 TABLET BY MOUTH EVERY 6  HOURS AS NEEDED (MUSCLE SPASMS,  PAIN). 50 tablet 0   Multiple Vitamin (MULTIVITAMIN WITH MINERALS) TABS tablet Take 1 tablet by mouth daily.     mupirocin  cream (BACTROBAN ) 2 % Apply 1 Application topically 2 (two) times daily. 60 g 1   oxyCODONE  (ROXICODONE ) 5 MG immediate release tablet Take 1 tablet (5 mg total) by mouth every 6 (six) hours as needed. 30 tablet 0   phentermine  37.5 MG capsule Take 1 capsule (37.5 mg total) by mouth every morning. 30 capsule 0   pregabalin  (LYRICA ) 75 MG capsule Take 1 capsule (75 mg total) by mouth 2 (two) times daily. 60 capsule 2   Spacer/Aero-Holding Chambers DEVI 1 Units by Does not apply route in the morning, at noon, and at bedtime. 1 Units 0   Turmeric 500 MG CAPS Take 500 mg by mouth 2 (two) times daily.     No current  facility-administered medications on file prior to visit.  [2]  Allergies Allergen Reactions   Pineapple Shortness Of Breath, Swelling and Hives    Swelling of tongue    Chocolate Hives   Coconut (Cocos Nucifera) Hives and Rash   Strawberry Extract Hives and Rash   Wheat Hives   Flavoring Agent Rash   "

## 2024-03-13 ENCOUNTER — Ambulatory Visit: Payer: Self-pay | Admitting: Family

## 2024-03-13 DIAGNOSIS — Z13228 Encounter for screening for other metabolic disorders: Secondary | ICD-10-CM

## 2024-03-13 LAB — BASIC METABOLIC PANEL WITH GFR
BUN/Creatinine Ratio: 20 (ref 12–28)
BUN: 17 mg/dL (ref 8–27)
CO2: 24 mmol/L (ref 20–29)
Calcium: 11.5 mg/dL — ABNORMAL HIGH (ref 8.7–10.3)
Chloride: 105 mmol/L (ref 96–106)
Creatinine, Ser: 0.84 mg/dL (ref 0.57–1.00)
Glucose: 91 mg/dL (ref 70–99)
Potassium: 3.6 mmol/L (ref 3.5–5.2)
Sodium: 142 mmol/L (ref 134–144)
eGFR: 80 mL/min/1.73

## 2024-03-13 LAB — HEMOGLOBIN A1C
Est. average glucose Bld gHb Est-mCnc: 123 mg/dL
Hgb A1c MFr Bld: 5.9 % — ABNORMAL HIGH (ref 4.8–5.6)

## 2024-03-13 LAB — MAGNESIUM: Magnesium: 2 mg/dL (ref 1.6–2.3)

## 2024-03-18 ENCOUNTER — Encounter: Payer: Self-pay | Admitting: Occupational Therapy

## 2024-03-18 ENCOUNTER — Ambulatory Visit: Admitting: Occupational Therapy

## 2024-03-18 DIAGNOSIS — M79641 Pain in right hand: Secondary | ICD-10-CM

## 2024-03-18 DIAGNOSIS — R278 Other lack of coordination: Secondary | ICD-10-CM

## 2024-03-18 DIAGNOSIS — M6281 Muscle weakness (generalized): Secondary | ICD-10-CM

## 2024-03-18 DIAGNOSIS — M25641 Stiffness of right hand, not elsewhere classified: Secondary | ICD-10-CM | POA: Diagnosis not present

## 2024-03-18 DIAGNOSIS — R208 Other disturbances of skin sensation: Secondary | ICD-10-CM

## 2024-03-18 NOTE — Therapy (Signed)
 " OUTPATIENT OCCUPATIONAL THERAPY ORTHO TREATMENT   Patient Name: ARIANN KHAIMOV MRN: [id number] DOB:1965/04/19, 59 y.o., female Today's Date: 03/18/2024  PCP: Jaycee Greig PARAS, NP  REFERRING PROVIDER: Arlinda Buster, MD   END OF SESSION:  OT End of Session - 03/18/24 1108     Visit Number 12    Number of Visits 16    Date for Recertification  04/05/24    Authorization Type [employer] Dual complete - covered 100%    Progress Note Due on Visit 10    OT Start Time 1102    OT Stop Time 1145    OT Time Calculation (min) 43 min    Equipment Utilized During Treatment Testing Material; splint    Activity Tolerance Patient tolerated treatment well    Behavior During Therapy WFL for tasks assessed/performed          Past Medical History:  Diagnosis Date   Asthma    followed by pcp   Bronchitis    chronic   Hypertension    OA (osteoarthritis)    left knee, right shoulder, left hip   Pneumonia    a couple years ago per pt on 09/15/21   Seasonal allergies    Wears glasses    Past Surgical History:  Procedure Laterality Date   ANTERIOR HIP REVISION Left 06/06/2018   Procedure: LEFT HIP WOUND DEHISCENCE POSSIBLE HEAD AND LINER EXCHANGE;  Surgeon: Fidel Rogue, MD;  Location: [facility];  Service: Orthopedics;  Laterality: Left;   COLONOSCOPY  2015   DECOMPRESSIVE LUMBAR LAMINECTOMY LEVEL 2 N/A 09/22/2023   Procedure: DECOMPRESSIVE LUMBAR LAMINECTOMY LEVEL 2;  Surgeon: Georgina Ozell LABOR, MD;  Location: [facility];  Service: Orthopedics;  Laterality: N/A;  Lumbar 2 and Lumbar 3 segmental laminectomies with partial medial facetectomies   DIAGNOSTIC LAPAROSCOPY     tubal preg-took ovary and tube   LAPAROSCOPY FOR ECTOPIC PREGNANCY  1990s   LEFT SALPINGECTOMY AND RIGHT OOPHORECTOMY FOR ABNORMALITY   LUMBAR LAMINECTOMY N/A 09/20/2021   Procedure: L3-4 AND L4-5 CENTRAL LUMBAR LAMINECTOMIES;  Surgeon: Lucilla Lynwood BRAVO, MD;  Location: [facility];  Service: Orthopedics;  Laterality: N/A;   MASS EXCISION   02/15/2012   Procedure: EXCISION MASS;  Surgeon: Donnice LABOR Robinsons, MD;  Location: [facility];  Service: Orthopedics;  Laterality: Right;  Excision of Right Small Volar Mass   MYELOGRAM     TONSILLECTOMY  age 67   TOTAL HIP ARTHROPLASTY Left 04/25/2018   Procedure: TOTAL HIP ARTHROPLASTY ANTERIOR APPROACH;  Surgeon: Fidel Rogue, MD;  Location: [facility];  Service: Orthopedics;  Laterality: Left;   TRIGGER FINGER RELEASE Right 2018   thumb   WISDOM TOOTH EXTRACTION  age 56   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 04/12/2022   Status post lumbar laminectomy 09/20/2021   Body mass index (BMI) 37.0-37.9, adult 03/14/2019   Lumbar stenosis without neurogenic claudication 03/14/2019   Hyperlipidemia 02/08/2019   Lumbar spondylosis 10/25/2018   Degeneration of lumbar intervertebral disc 10/01/2018   Surgical wound dehiscence 06/06/2018   Osteoarthritis of left hip 04/25/2018   Spinal stenosis of lumbar region 02/23/2018   Pain in left knee 01/10/2018   Trigger thumb of right hand 03/30/2017   Symptomatic mammary hypertrophy 11/28/2014   Dyspnea 03/26/2013   Asthma vs VCD  02/28/2013   Hypertensive disorder 06/07/2012   Pyogenic granuloma 02/15/2012    ONSET  DATE: 11/20/23 referral date (surgery 12/07/23)   REFERRING DIAG: M25.541 (ICD-10-CM) - Arthralgia of metacarpophalangeal joint, right  Note: Needs OT 2 weeks s/p right long finger MCP arthroplasty  SPLINT  Imaging: XR Hand Complete Right Result Date: 12/21/2023 Stable appearance of the long finger MCP region status post prior resection arthroplasty, appropriate spacing is notable between the metacarpal head and proximal phalanx base  THERAPY DIAG:  Stiffness of right hand, not elsewhere classified  Pain in right hand  Other lack of coordination  Muscle weakness (generalized)  Other  disturbances of skin sensation  Rationale for Evaluation and Treatment: Rehabilitation  SUBJECTIVE:   SUBJECTIVE STATEMENT:   Pt reports still no pain, just stiffness and aching around her PIP joint (above surgical site).    Pt accompanied by: self  PERTINENT HISTORY: Lt THA 2020, decompression of lumbar spline 08/2023 (L2-3)   PRECAUTIONS: Other: per protocol Rt hand, no lifting over 20 lbs d/t back    WEIGHT BEARING RESTRICTIONS: No  PAIN:  Are you having pain? No, just stiffness and achy and tender Rt middle finger around PIP joint  FALLS: Has patient fallen in last 6 months? No  LIVING ENVIRONMENT: Lives with: lives with 18 y.o. grandson Lives in: 1 story home, 1 step to enter Has following equipment at home: Single point cane, Environmental Consultant - 2 wheeled, and bed side commode  PLOF: Independent and on disability d/t hip   PATIENT GOALS: Get use of my Rt hand back  NEXT MD VISIT: 06/03/24  OBJECTIVE:  Note: Objective measures were completed at Evaluation unless otherwise noted.  HAND DOMINANCE: Right  ADLs: Overall ADLs: Mod I for BADLS and IADLS w/ Lt hand and difficulty  FUNCTIONAL OUTCOME MEASURES: 01/09/24: Junie Dash: 52.27% Deficit RUE 03/18/24: [name]: 13.64% deficit RUE   UPPER EXTREMITY ROM:   BUE AROM WNLs at shoulders, elbows, and forearms  Active ROM Right eval Left eval  Shoulder flexion    Shoulder abduction    Shoulder adduction    Shoulder extension    Shoulder internal rotation    Shoulder external rotation    Elbow flexion    Elbow extension    Wrist flexion 45   Wrist extension 35   Wrist ulnar deviation    Wrist radial deviation    Wrist pronation    Wrist supination    (Blank rows = not tested)  Active ROM Right eval Left eval  Thumb MCP (0-60)    Thumb IP (0-80)    Thumb Radial abd/add (0-55)     Thumb Palmar abd/add (0-45)     Thumb Opposition to Small Finger     Index MCP (0-90)     Index PIP (0-100)     Index DIP  (0-70)      Long MCP (0-90) 54*, -15*     Long PIP (0-100) 75*, 0*     Long DIP (0-70)      Ring MCP (0-90)      Ring PIP (0-100)      Ring DIP (0-70)      Little MCP (0-90)      Little PIP (0-100)      Little DIP (0-70)      (Blank rows = not tested)  01/17/24: Rt long finger MP joint  = 65*, - 20*  Rt long finger PIP joint = 90*, -10*   UPPER EXTREMITY MMT:   NT   HAND FUNCTION: Grip strength TBD once precautions lifted 02/13/24: Rt = 28.8 lbs,  Lt = 54.6 lbs  03/04/24 Rt 36.3, 48.0, 49.1, 50.9; Lt 54.8, 54.6, 66.1, 68.1 Average: Right: 46.1 lbs Left: 60.9 lbs  COORDINATION: Impaired d/t edema and decreased ROM   03/04/24 9 hole peg test Right: 21.82 Left: 20.50 sec;   SENSATION: Not formally assessed  03/04/24 - WFL   EDEMA: moderate dorsal hand and in fingers (long finger worse)  COGNITION: Overall cognitive status: Within functional limits for tasks assessed  OBSERVATIONS: Pt came directly from MD office w/ gauze dressing wrapped around hand.    TREATMENT DATE:   Discussed weaning from pm extension splint and seeing how she does, however if MP extensor lag worsens, then return to wearing at night.   Fluidotherapy x 10 minutes for Rt to address stiffness. No adverse reactions.   Quick Dash: 13.64% deficit Rt hand - reviewed results compared to initial assessment.   Gripper set at level 3 resistance to pick up blocks Rt hand for sustained grip strength, control and coordination. Pt able to do task 3 full times w/o difficulty    PATIENT EDUCATION: Education details: Progress to date Person educated: Patient Education method: Explanation, Demonstration, and Verbal cues Education comprehension: verbalized understanding, returned demonstration, and verbal cues required  HOME EXERCISE PROGRAM: 01/04/24: A/ROM HEP for Rt hand/wrist 01/09/24: P/ROM HEP, scar massage 01/24/24: Coordination Activities 02/05/24: Putty HEP  02/15/24: Updates to HEP   GOALS: Goals  reviewed with patient? Yes  SHORT TERM GOALS: Target date: 02/03/24  Independent with splint wear and care Baseline: Goal status: MET  2.  Independent with ROM HEP  Baseline:  Goal status: MET  3.  Pt to verbalize understanding of edema management strategies  Baseline:  Goal status: MET  4.  Pt to improve in Rt long finger MP flexion and PIP flexion by 10* or more Baseline: 54*, 75*  Goal status: MET (01/17/24 = 65*, 90*)   5.  Pt to improve Rt long finger MP extension to -5*  Baseline: -15* 02/13/24 = -10*  Goal status: IN PROGRESS   LONG TERM GOALS: Target date: 03/04/24  Independent with updated strengthening HEP  Baseline:  Goal status: MET   2.  Pt to demo ROM Rt hand WFL's to make fist for full grasp/release Baseline:  Goal status: MET   3.  Pt to return to using Rt hand as dominant hand for ADLS and bilateral tasks Baseline:  Goal status: MET (except heavy tasks)   4.  Pt to demo 50% or greater Rt grip strength compared to Lt hand Baseline:  Goal status: MET   5.  Quick Dash to be 20% or less deficit RUE Baseline: 52.27%  Goal status: MET (13.64% deficit)   ASSESSMENT:  CLINICAL IMPRESSION: Patient seen today for occupational therapy treatment for s/p Rt long finger MCP arthroplasty. Patient doing well, however lacks full extension and flexion of Rt long finger. Pt has met 4/5 STG's and all LTG's at this time. Anticipate d/c next session   PERFORMANCE DEFICITS: in functional skills including ADLs, IADLs, coordination, dexterity, sensation, edema, ROM, strength, pain, fascial restrictions, Fine motor control, Gross motor control, decreased knowledge of precautions, and UE functional use.   IMPAIRMENTS: are limiting patient from ADLs, IADLs, leisure, and social participation.   COMORBIDITIES: may have co-morbidities  that affects occupational performance. Patient will benefit from skilled OT to address above impairments and improve overall  function.  REHAB POTENTIAL: Good  PLAN:  OT FREQUENCY: 1x/week   OT DURATION: additional 4 weeks  PLANNED INTERVENTIONS:  02464 self care/ADL training, 02889 therapeutic exercise, 97530 therapeutic activity, 97140 manual therapy, 97035 ultrasound, 97018 paraffin, 02960 fluidotherapy, 97010 moist heat, 97010 cryotherapy, 97034 contrast bath, 97760 Orthotic Initial, 97763 Orthotic/Prosthetic subsequent, scar mobilization, passive range of motion, compression bandaging, patient/family education, and DME and/or AE instructions  RECOMMENDED OTHER SERVICES: none at this time  CONSULTED AND AGREED WITH PLAN OF CARE: Patient  PLAN FOR NEXT SESSION:  Paraffin, gripper level 4, check remaining goal and d/c   Burnard JINNY Roads, OT 03/18/2024, 11:09 AM   "

## 2024-03-20 ENCOUNTER — Ambulatory Visit: Admitting: Orthopedic Surgery

## 2024-03-20 DIAGNOSIS — Z9889 Other specified postprocedural states: Secondary | ICD-10-CM

## 2024-03-20 NOTE — Progress Notes (Signed)
 Orthopedic Surgery Post-operative Office Visit   Procedure: L2/3 laminectomy Date of Surgery: 09/22/2023 (~6 months post-op)   Assessment: Patient is a 59 y.o. who is doing well after surgery. Still has some left hip flexor weakness     Plan: -No spine specific restrictions -Continue to work out at [employer] and work on doctor, hospital. Gave her an exercise program to work on the hip flexors -I told her that it can take months for neurologic recovery and in cases like hers were the weakness has been more chronic, it sometimes does not recover -If her weakness has not improved by the next visit, that will by 9 months since surgery and I will order a MRI of the lumbar spine to look for an residual or new stenosis -Pain management: tylenol  as needed -Return to office in 3 months, x-rays needed at next visit: None   ___________________________________________________________________________     Subjective: Patient sometimes has back pain if she does too much. It resolves if she rests. She does not have any consistent back pain. She has not noticed any radiating leg pain. [facility]. She has not noticed any significant change in her strength since she was last here. Continues to ambulate without assistive devices.   Objective:   General: no acute distress, appropriate affect Neurologic: alert, answering questions appropriately, following commands Respiratory: unlabored breathing on room air Skin: incision is well healed   MSK (spine):   -Strength exam                                                   Left                  Right   EHL                              5/5                  5/5 TA                                 5/5                  5/5 GSC                             5/5                  5/5 Knee extension            5/5                  5/5 Hip flexion                    4-/5                 5/5   -Sensory exam                            Sensation intact to light touch in L3-S1 nerve distributions of bilateral lower extremities   Imaging: XRs of  the lumbar spine from 12/18/2023 were previously independently reviewed and interpreted, showing disc height loss at L1/2, L2/3, L4/5.  There is small anterior osteophyte formation at those levels.  No evidence of instability on flexion/extension views.  Laminectomy defect seen from L2-L5.  No fracture or dislocation seen.     Patient name: Darlene Crosby Patient MRN: [id number] Date of visit: 03/20/24

## 2024-03-25 ENCOUNTER — Ambulatory Visit: Admitting: Occupational Therapy

## 2024-03-27 ENCOUNTER — Ambulatory Visit: Admitting: Occupational Therapy

## 2024-03-27 ENCOUNTER — Encounter: Payer: Self-pay | Admitting: Occupational Therapy

## 2024-03-27 DIAGNOSIS — R208 Other disturbances of skin sensation: Secondary | ICD-10-CM

## 2024-03-27 DIAGNOSIS — M6281 Muscle weakness (generalized): Secondary | ICD-10-CM

## 2024-03-27 DIAGNOSIS — M25641 Stiffness of right hand, not elsewhere classified: Secondary | ICD-10-CM

## 2024-03-27 DIAGNOSIS — R600 Localized edema: Secondary | ICD-10-CM

## 2024-03-27 NOTE — Therapy (Signed)
 " OUTPATIENT OCCUPATIONAL THERAPY ORTHO TREATMENT/DISCHARGE  Patient Name: Darlene Crosby MRN: [id number] DOB:[birth year], 59 y.o., female Today's Date: 03/27/2024  PCP: Jaycee Greig PARAS, NP  REFERRING PROVIDER: Arlinda Buster, MD    OCCUPATIONAL THERAPY DISCHARGE SUMMARY  Visits from Start of Care: 13  Current functional level related to goals / functional outcomes: SEE BELOW - Pt has met all LTG's   Remaining deficits: Slight MP extensor lag Edema   Education / Equipment: See below HEP for all education   Patient agrees to discharge. Patient goals were met. Patient is being discharged due to meeting the stated rehab goals..     END OF SESSION:  OT End of Session - 03/27/24 1238     Visit Number 13    Number of Visits 16    Date for Recertification  04/05/24    Authorization Type [employer] Dual complete - covered 100%    Progress Note Due on Visit 10    OT Start Time 1232    OT Stop Time 1310    OT Time Calculation (min) 38 min    Equipment Utilized During Treatment Testing Material; splint    Activity Tolerance Patient tolerated treatment well    Behavior During Therapy WFL for tasks assessed/performed          Past Medical History:  Diagnosis Date   Asthma    followed by pcp   Bronchitis    chronic   Hypertension    OA (osteoarthritis)    left knee, right shoulder, left hip   Pneumonia    a couple years ago per pt on 09/15/21   Seasonal allergies    Wears glasses    Past Surgical History:  Procedure Laterality Date   ANTERIOR HIP REVISION Left 06/06/2018   Procedure: LEFT HIP WOUND DEHISCENCE POSSIBLE HEAD AND LINER EXCHANGE;  Surgeon: Fidel Rogue, MD;  Location: [facility];  Service: Orthopedics;  Laterality: Left;   COLONOSCOPY  2015   DECOMPRESSIVE LUMBAR LAMINECTOMY LEVEL 2 N/A 09/22/2023   Procedure: DECOMPRESSIVE LUMBAR LAMINECTOMY LEVEL 2;  Surgeon: Georgina Ozell LABOR, MD;  Location: [facility];  Service: Orthopedics;  Laterality: N/A;  Lumbar 2 and  Lumbar 3 segmental laminectomies with partial medial facetectomies   DIAGNOSTIC LAPAROSCOPY     tubal preg-took ovary and tube   LAPAROSCOPY FOR ECTOPIC PREGNANCY  1990s   LEFT SALPINGECTOMY AND RIGHT OOPHORECTOMY FOR ABNORMALITY   LUMBAR LAMINECTOMY N/A 09/20/2021   Procedure: L3-4 AND L4-5 CENTRAL LUMBAR LAMINECTOMIES;  Surgeon: Lucilla Lynwood BRAVO, MD;  Location: [facility];  Service: Orthopedics;  Laterality: N/A;   MASS EXCISION  02/15/2012   Procedure: EXCISION MASS;  Surgeon: Donnice LABOR Robinsons, MD;  Location: [facility];  Service: Orthopedics;  Laterality: Right;  Excision of Right Small Volar Mass   MYELOGRAM     TONSILLECTOMY  age 22   TOTAL HIP ARTHROPLASTY Left 04/25/2018   Procedure: TOTAL HIP ARTHROPLASTY ANTERIOR APPROACH;  Surgeon: Fidel Rogue, MD;  Location: [facility];  Service: Orthopedics;  Laterality: Left;   TRIGGER FINGER RELEASE Right 2018   thumb   WISDOM TOOTH EXTRACTION  age 78   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 04/12/2022   Status post lumbar laminectomy 09/20/2021   Body mass index (BMI) 37.0-37.9, adult 03/14/2019   Lumbar stenosis without neurogenic claudication 03/14/2019   Hyperlipidemia 02/08/2019   Lumbar spondylosis 10/25/2018  Degeneration of lumbar intervertebral disc 10/01/2018   Surgical wound dehiscence 06/06/2018   Osteoarthritis of left hip 04/25/2018   Spinal stenosis of lumbar region 02/23/2018   Pain in left knee 01/10/2018   Trigger thumb of right hand 03/30/2017   Symptomatic mammary hypertrophy 11/28/2014   Dyspnea 03/26/2013   Asthma vs VCD  02/28/2013   Hypertensive disorder 06/07/2012   Pyogenic granuloma 02/15/2012    ONSET DATE: 11/20/23 referral date (surgery 12/07/23)   REFERRING DIAG: M25.541 (ICD-10-CM) - Arthralgia of metacarpophalangeal joint, right  Note: Needs OT 2 weeks s/p right long  finger MCP arthroplasty  SPLINT  Imaging: XR Hand Complete Right Result Date: 12/21/2023 Stable appearance of the long finger MCP region status post prior resection arthroplasty, appropriate spacing is notable between the metacarpal head and proximal phalanx base  THERAPY DIAG:  Stiffness of right hand, not elsewhere classified  Muscle weakness (generalized)  Other disturbances of skin sensation  Localized edema  Rationale for Evaluation and Treatment: Rehabilitation  SUBJECTIVE:   SUBJECTIVE STATEMENT:   Pt reports she shoveled driveway with no pain    Pt accompanied by: self  PERTINENT HISTORY: Lt THA 2020, decompression of lumbar spline 08/2023 (L2-3)   PRECAUTIONS: Other: per protocol Rt hand, no lifting over 20 lbs d/t back    WEIGHT BEARING RESTRICTIONS: No  PAIN:  Are you having pain? No, just stiffness and achy and tender Rt middle finger around PIP joint  FALLS: Has patient fallen in last 6 months? No  LIVING ENVIRONMENT: Lives with: lives with 55 y.o. grandson Lives in: 1 story home, 1 step to enter Has following equipment at home: Single point cane, Environmental Consultant - 2 wheeled, and bed side commode  PLOF: Independent and on disability d/t hip   PATIENT GOALS: Get use of my Rt hand back  NEXT MD VISIT: 06/03/24  OBJECTIVE:  Note: Objective measures were completed at Evaluation unless otherwise noted.  HAND DOMINANCE: Right  ADLs: Overall ADLs: Mod I for BADLS and IADLS w/ Lt hand and difficulty  FUNCTIONAL OUTCOME MEASURES: 01/09/24: Junie Dash: 52.27% Deficit RUE 03/18/24: [name]: 13.64% deficit RUE   UPPER EXTREMITY ROM:   BUE AROM WNLs at shoulders, elbows, and forearms  Active ROM Right eval Left eval  Shoulder flexion    Shoulder abduction    Shoulder adduction    Shoulder extension    Shoulder internal rotation    Shoulder external rotation    Elbow flexion    Elbow extension    Wrist flexion 45   Wrist extension 35   Wrist ulnar  deviation    Wrist radial deviation    Wrist pronation    Wrist supination    (Blank rows = not tested)  Active ROM Right eval Left eval  Thumb MCP (0-60)    Thumb IP (0-80)    Thumb Radial abd/add (0-55)     Thumb Palmar abd/add (0-45)     Thumb Opposition to Small Finger     Index MCP (0-90)     Index PIP (0-100)     Index DIP (0-70)      Long MCP (0-90) 54*, -15*     Long PIP (0-100) 75*, 0*     Long DIP (0-70)      Ring MCP (0-90)      Ring PIP (0-100)      Ring DIP (0-70)      Little MCP (0-90)      Little PIP (0-100)  Little DIP (0-70)      (Blank rows = not tested)  01/17/24: Rt long finger MP joint  = 65*, - 20*  Rt long finger PIP joint = 90*, -10*   UPPER EXTREMITY MMT:   NT   HAND FUNCTION: Grip strength TBD once precautions lifted 02/13/24: Rt = 28.8 lbs, Lt = 54.6 lbs  03/04/24 Rt 36.3, 48.0, 49.1, 50.9; Lt 54.8, 54.6, 66.1, 68.1 Average: Right: 46.1 lbs Left: 60.9 lbs  COORDINATION: Impaired d/t edema and decreased ROM   03/04/24 9 hole peg test Right: 21.82 Left: 20.50 sec;   SENSATION: Not formally assessed  03/04/24 - WFL   EDEMA: moderate dorsal hand and in fingers (long finger worse)  COGNITION: Overall cognitive status: Within functional limits for tasks assessed  OBSERVATIONS: Pt came directly from MD office w/ gauze dressing wrapped around hand.    TREATMENT DATE:   Paraffin x 10 minutes to Rt hand to decrease stiffness - therapist cut extra straps for pm splint while pt in paraffin  Gripper set at level 4 resistance to pick up blocks Rt hand for sustained grip strength, control and coordination. Pt able to do task 2 full times w/o difficulty    PATIENT EDUCATION: Education details: Progress to date Person educated: Patient Education method: Explanation, Demonstration, and Verbal cues Education comprehension: verbalized understanding, returned demonstration, and verbal cues required  HOME EXERCISE PROGRAM: 01/04/24: A/ROM  HEP for Rt hand/wrist 01/09/24: P/ROM HEP, scar massage 01/24/24: Coordination Activities 02/05/24: Putty HEP  02/15/24: Updates to HEP   GOALS: Goals reviewed with patient? Yes  SHORT TERM GOALS: Target date: 02/03/24  Independent with splint wear and care Baseline: Goal status: MET  2.  Independent with ROM HEP  Baseline:  Goal status: MET  3.  Pt to verbalize understanding of edema management strategies  Baseline:  Goal status: MET  4.  Pt to improve in Rt long finger MP flexion and PIP flexion by 10* or more Baseline: 54*, 75*  Goal status: MET (01/17/24 = 65*, 90*)   5.  Pt to improve Rt long finger MP extension to -5*  Baseline: -15* 02/13/24 = -10*  Goal status: NOT MET (-10*)   LONG TERM GOALS: Target date: 03/04/24  Independent with updated strengthening HEP  Baseline:  Goal status: MET   2.  Pt to demo ROM Rt hand WFL's to make fist for full grasp/release Baseline:  Goal status: MET   3.  Pt to return to using Rt hand as dominant hand for ADLS and bilateral tasks Baseline:  Goal status: MET (except heavy tasks)   4.  Pt to demo 50% or greater Rt grip strength compared to Lt hand Baseline:  Goal status: MET   5.  Quick Dash to be 20% or less deficit RUE Baseline: 52.27%  Goal status: MET (13.64% deficit)   ASSESSMENT:  CLINICAL IMPRESSION: Patient seen today for occupational therapy treatment for s/p Rt long finger MCP arthroplasty. Patient doing well, however lacks full extension and flexion of Rt long finger. Pt has met 4/5 STG's and all LTG's at this time.    PERFORMANCE DEFICITS: in functional skills including ADLs, IADLs, coordination, dexterity, sensation, edema, ROM, strength, pain, fascial restrictions, Fine motor control, Gross motor control, decreased knowledge of precautions, and UE functional use.   IMPAIRMENTS: are limiting patient from ADLs, IADLs, leisure, and social participation.   COMORBIDITIES: may have co-morbidities  that  affects occupational performance. Patient will benefit from skilled OT to address above impairments  and improve overall function.  REHAB POTENTIAL: Good  PLAN:  OT FREQUENCY: 1x/week   OT DURATION: additional 4 weeks  PLANNED INTERVENTIONS: 97535 self care/ADL training, 02889 therapeutic exercise, 97530 therapeutic activity, 97140 manual therapy, 97035 ultrasound, 97018 paraffin, 02960 fluidotherapy, 97010 moist heat, 97010 cryotherapy, 97034 contrast bath, 97760 Orthotic Initial, 97763 Orthotic/Prosthetic subsequent, scar mobilization, passive range of motion, compression bandaging, patient/family education, and DME and/or AE instructions  RECOMMENDED OTHER SERVICES: none at this time  CONSULTED AND AGREED WITH PLAN OF CARE: Patient  PLAN  D/C O.T.   Burnard JINNY Roads, OT 03/27/2024, 1:14 PM   "

## 2024-04-19 ENCOUNTER — Ambulatory Visit: Admitting: Family

## 2024-04-25 ENCOUNTER — Ambulatory Visit: Payer: 59

## 2024-06-03 ENCOUNTER — Ambulatory Visit: Admitting: Orthopedic Surgery

## 2024-06-11 ENCOUNTER — Ambulatory Visit: Payer: Self-pay | Admitting: Family

## 2024-06-19 ENCOUNTER — Ambulatory Visit: Admitting: Orthopedic Surgery

## 2024-07-04 ENCOUNTER — Ambulatory Visit
